# Patient Record
Sex: Female | Born: 1960 | Race: Black or African American | Hispanic: No | Marital: Married | Smoking: Former smoker
Health system: Southern US, Community
[De-identification: ages and names within clinical notes are randomized; demographics above are authoritative.]

## PROBLEM LIST (undated history)

## (undated) DIAGNOSIS — J189 Pneumonia, unspecified organism: Secondary | ICD-10-CM

## (undated) DIAGNOSIS — H269 Unspecified cataract: Secondary | ICD-10-CM

## (undated) DIAGNOSIS — E119 Type 2 diabetes mellitus without complications: Secondary | ICD-10-CM

## (undated) DIAGNOSIS — M1990 Unspecified osteoarthritis, unspecified site: Secondary | ICD-10-CM

## (undated) DIAGNOSIS — I10 Essential (primary) hypertension: Secondary | ICD-10-CM

## (undated) DIAGNOSIS — J45909 Unspecified asthma, uncomplicated: Secondary | ICD-10-CM

## (undated) DIAGNOSIS — D649 Anemia, unspecified: Secondary | ICD-10-CM

## (undated) DIAGNOSIS — C541 Malignant neoplasm of endometrium: Secondary | ICD-10-CM

## (undated) DIAGNOSIS — T7840XA Allergy, unspecified, initial encounter: Secondary | ICD-10-CM

## (undated) DIAGNOSIS — E1140 Type 2 diabetes mellitus with diabetic neuropathy, unspecified: Secondary | ICD-10-CM

## (undated) DIAGNOSIS — Z800 Family history of malignant neoplasm of digestive organs: Secondary | ICD-10-CM

## (undated) DIAGNOSIS — E785 Hyperlipidemia, unspecified: Secondary | ICD-10-CM

## (undated) DIAGNOSIS — K219 Gastro-esophageal reflux disease without esophagitis: Secondary | ICD-10-CM

## (undated) DIAGNOSIS — I442 Atrioventricular block, complete: Secondary | ICD-10-CM

## (undated) DIAGNOSIS — K859 Acute pancreatitis without necrosis or infection, unspecified: Secondary | ICD-10-CM

## (undated) DIAGNOSIS — N939 Abnormal uterine and vaginal bleeding, unspecified: Secondary | ICD-10-CM

## (undated) HISTORY — DX: Unspecified cataract: H26.9

## (undated) HISTORY — PX: TONSILLECTOMY: SUR1361

## (undated) HISTORY — DX: Essential (primary) hypertension: I10

## (undated) HISTORY — DX: Morbid (severe) obesity due to excess calories: E66.01

## (undated) HISTORY — DX: Hyperlipidemia, unspecified: E78.5

## (undated) HISTORY — DX: Type 2 diabetes mellitus without complications: E11.9

## (undated) HISTORY — DX: Family history of malignant neoplasm of digestive organs: Z80.0

## (undated) HISTORY — DX: Unspecified asthma, uncomplicated: J45.909

## (undated) HISTORY — DX: Type 2 diabetes mellitus with diabetic neuropathy, unspecified: E11.40

## (undated) HISTORY — DX: Allergy, unspecified, initial encounter: T78.40XA

## (undated) HISTORY — PX: CERVICAL BIOPSY: SHX590

## (undated) HISTORY — DX: Unspecified osteoarthritis, unspecified site: M19.90

---

## 2008-12-07 HISTORY — PX: CYSTECTOMY: SUR359

## 2016-01-13 ENCOUNTER — Encounter: Payer: Self-pay | Admitting: Internal Medicine

## 2016-02-11 ENCOUNTER — Telehealth: Payer: Self-pay | Admitting: *Deleted

## 2016-02-11 NOTE — Telephone Encounter (Signed)
[facility] on number listed in chart Lelan Pons PV

## 2016-02-11 NOTE — Telephone Encounter (Signed)
Pt returned call and explained due to medical hx and high risk needs OV per MD. She verbalized understanding and we scheduled OV 5-1 at 0845 am with Dr Henrene Pastor. Cancelled PV and colon that was scheduled.  Instructed pt to ck in 0830 am 04-06-16  3rd floor [facility] building.  Lelan Pons PV

## 2016-02-11 NOTE — Telephone Encounter (Signed)
Dr Henrene Pastor, This lady is a direct colon with you on 03-09-16, Monday. She has no GI history.  She has a medical history of HTN, asthma, DM, osteoarthritis. She has a BMI of 59.1 and a weight documented of 384.0 lbs Do you want her to have an OV or can she be a direct hospital? Thanks for your time and please advise,  Marijean Niemann

## 2016-02-11 NOTE — Telephone Encounter (Signed)
This patient needs an office visit first, as she is high risk

## 2016-02-24 ENCOUNTER — Ambulatory Visit (AMBULATORY_SURGERY_CENTER): Payer: Self-pay | Admitting: *Deleted

## 2016-02-24 DIAGNOSIS — Z1211 Encounter for screening for malignant neoplasm of colon: Secondary | ICD-10-CM

## 2016-03-09 ENCOUNTER — Encounter: Payer: Self-pay | Admitting: Internal Medicine

## 2016-04-06 ENCOUNTER — Encounter: Payer: Self-pay | Admitting: Internal Medicine

## 2016-04-06 ENCOUNTER — Ambulatory Visit (INDEPENDENT_AMBULATORY_CARE_PROVIDER_SITE_OTHER): Payer: Managed Care, Other (non HMO) | Admitting: Internal Medicine

## 2016-04-06 VITALS — BP 138/90 | HR 76 | Ht 67.5 in | Wt 395.2 lb

## 2016-04-06 DIAGNOSIS — Z1211 Encounter for screening for malignant neoplasm of colon: Secondary | ICD-10-CM | POA: Diagnosis not present

## 2016-04-06 DIAGNOSIS — K219 Gastro-esophageal reflux disease without esophagitis: Secondary | ICD-10-CM | POA: Diagnosis not present

## 2016-04-06 DIAGNOSIS — R143 Flatulence: Secondary | ICD-10-CM | POA: Diagnosis not present

## 2016-04-06 NOTE — Progress Notes (Signed)
HISTORY OF PRESENT ILLNESS:  Leslie Mullen is a 55 y.o. 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 Hall Mullen regarding screening colonoscopy. Patient has not had prior screening. She is sent for a preprocedure evaluation due to morbid obesity with a BMI of greater than 60. The patient denies family history of colon cancer or problems with bleeding. She denies any problems identified by her PCP such as Hemoccult positive stool or anemia. She does report increased flatus and somewhat alternating bowel habits since recent treatment with antibiotics. She has chronic GERD which is controlled with omeprazole. No dysphagia. No weight loss.  REVIEW OF SYSTEMS:  All non-GI ROS negative except for arthritis, cough, fatigue, ankle swelling  Past Medical History  Diagnosis Date  . Hypertension   . Asthma   . Diabetes ([place])   . Osteoarthritis   . Hyperlipidemia   . Morbid obesity ([place])   . Cataract   . Diabetic neuropathy [facility])     Past Surgical History  Procedure Laterality Date  . Cesarean section      x3    Social History Leslie Mullen  reports that she has quit smoking. She has never used smokeless tobacco. She reports that she does not drink alcohol or use illicit drugs.  family history includes Diabetes in her father; Heart disease in her father.  Allergies  Allergen Reactions  . Penicillins Itching, Shortness Of Breath and Swelling       PHYSICAL EXAMINATION: Vital signs: BP 138/90 mmHg  Pulse 76  Ht 5' 7.5" (1.715 m)  Wt 395 lb 3.2 oz (179.262 kg)  BMI 60.95 kg/m2  Constitutional: Markedly obese but otherwise well-appearing, no acute distress Psychiatric: alert and oriented x3, cooperative Eyes: extraocular movements intact, anicteric, conjunctiva pink Mouth: oral pharynx moist, no lesions Neck: supple but thick without  lymphadenopathy Cardiovascular: heart regular rate and rhythm, no murmur Lungs:  clear to auscultation bilaterally Abdomen: soft, obese, nontender, nondistended, no obvious ascites, no peritoneal signs, normal bowel sounds, no organomegaly Rectal: Omitted Extremities: no clubbing cyanosis or lower extremity edema bilaterally Skin: no lesions on visible extremities Neuro: No focal deficits. Cranial nerves intact.  ASSESSMENT:  #1. Colon cancer screening. We discussed several options but focused on optical colonoscopy and cologard in great detail. I discussed with her that she is high risk for sedation given her body habitus. #2. GERD. It is controlled with omeprazole #3. Recent issues with intestinal gas and alternating bowel habits likely related to recent treatment with antibiotic and alteration of the gut micro-biome  PLAN:  #1. The patient has elected for cologard. She understands if this is positive then optical colonoscopy would follow at some point #2. Reflux precautions #3. Weight loss. Imperative #4. Continue PPI. Lowest dose to control symptoms recommended #5. Probiotic to see if issues with gas and altered bowel habits improve #6. Ongoing general medical care with PCP  A copy of this consultation note has been sent to Dr. Augustin Mullen

## 2016-04-06 NOTE — Patient Instructions (Signed)
You will be contacted by [employer] to initiate your Cologuard

## 2016-06-12 ENCOUNTER — Telehealth: Payer: Self-pay

## 2016-06-12 NOTE — Telephone Encounter (Signed)
Message left by Vivien Rota.  Awaiting response

## 2016-06-12 NOTE — Telephone Encounter (Signed)
Pt has not completed her cologuard test that was ordered 04-06-2016 during the office visit. [employer] has suspended the order due to inactivity. I did leave a voicemail for the pt to remind her to complete the test.

## 2016-06-12 NOTE — Telephone Encounter (Signed)
Yes.  Thank you.

## 2017-03-08 ENCOUNTER — Other Ambulatory Visit: Payer: Self-pay

## 2017-03-08 ENCOUNTER — Telehealth: Payer: Self-pay | Admitting: Internal Medicine

## 2017-03-08 DIAGNOSIS — R195 Other fecal abnormalities: Secondary | ICD-10-CM

## 2017-03-08 LAB — COLOGUARD: Cologuard: POSITIVE

## 2017-03-08 NOTE — Telephone Encounter (Signed)
Called back and let them know we have not received the result. Result to be faxed over.

## 2017-04-16 ENCOUNTER — Ambulatory Visit (AMBULATORY_SURGERY_CENTER): Payer: Self-pay

## 2017-04-16 VITALS — Ht 68.0 in | Wt >= 6400 oz

## 2017-04-16 DIAGNOSIS — R195 Other fecal abnormalities: Secondary | ICD-10-CM

## 2017-04-16 MED ORDER — NA SULFATE-K SULFATE-MG SULF 17.5-3.13-1.6 GM/177ML PO SOLN: 1.0000 | Freq: Once | ORAL | 0 refills | Status: AC

## 2017-04-16 NOTE — Progress Notes (Signed)
Denies allergies to eggs or soy products. Denies complication of anesthesia or sedation. Denies use of weight loss medication. Denies use of O2.   Emmi instructions given for colonoscopy.  

## 2017-04-27 ENCOUNTER — Telehealth: Payer: Self-pay | Admitting: Internal Medicine

## 2017-04-27 NOTE — Telephone Encounter (Signed)
Procedure moved to 06/29/17@[facility]  at 9:15am. Pt should arrive at the hospital at 7:45am. New prep instructions mailed to pt. Pt aware of appt.

## 2017-06-28 ENCOUNTER — Encounter (HOSPITAL_COMMUNITY): Payer: Self-pay | Admitting: *Deleted

## 2017-06-29 ENCOUNTER — Ambulatory Visit (HOSPITAL_COMMUNITY)
Admission: RE | Admit: 2017-06-29 | Discharge: 2017-06-29 | Disposition: A | Discharge status: Home / Self Care | Payer: Managed Care, Other (non HMO) | Source: Ambulatory Visit | Attending: Internal Medicine | Admitting: Internal Medicine

## 2017-06-29 ENCOUNTER — Other Ambulatory Visit: Payer: Self-pay

## 2017-06-29 ENCOUNTER — Encounter (HOSPITAL_COMMUNITY)
Admission: RE | Disposition: A | Discharge status: Home / Self Care | Payer: Self-pay | Source: Ambulatory Visit | Attending: Internal Medicine

## 2017-06-29 ENCOUNTER — Ambulatory Visit (HOSPITAL_COMMUNITY): Payer: Managed Care, Other (non HMO) | Admitting: Anesthesiology

## 2017-06-29 ENCOUNTER — Encounter (HOSPITAL_COMMUNITY): Payer: Self-pay | Admitting: Anesthesiology

## 2017-06-29 DIAGNOSIS — I10 Essential (primary) hypertension: Secondary | ICD-10-CM | POA: Insufficient documentation

## 2017-06-29 DIAGNOSIS — Z794 Long term (current) use of insulin: Secondary | ICD-10-CM | POA: Diagnosis not present

## 2017-06-29 DIAGNOSIS — R195 Other fecal abnormalities: Secondary | ICD-10-CM

## 2017-06-29 DIAGNOSIS — D120 Benign neoplasm of cecum: Secondary | ICD-10-CM | POA: Diagnosis not present

## 2017-06-29 DIAGNOSIS — K5730 Diverticulosis of large intestine without perforation or abscess without bleeding: Secondary | ICD-10-CM | POA: Insufficient documentation

## 2017-06-29 DIAGNOSIS — K219 Gastro-esophageal reflux disease without esophagitis: Secondary | ICD-10-CM | POA: Insufficient documentation

## 2017-06-29 DIAGNOSIS — E1140 Type 2 diabetes mellitus with diabetic neuropathy, unspecified: Secondary | ICD-10-CM | POA: Insufficient documentation

## 2017-06-29 DIAGNOSIS — D122 Benign neoplasm of ascending colon: Secondary | ICD-10-CM | POA: Diagnosis not present

## 2017-06-29 DIAGNOSIS — Z87891 Personal history of nicotine dependence: Secondary | ICD-10-CM | POA: Diagnosis not present

## 2017-06-29 DIAGNOSIS — D123 Benign neoplasm of transverse colon: Secondary | ICD-10-CM

## 2017-06-29 HISTORY — PX: COLONOSCOPY WITH PROPOFOL: SHX5780

## 2017-06-29 HISTORY — DX: Gastro-esophageal reflux disease without esophagitis: K21.9

## 2017-06-29 LAB — GLUCOSE, CAPILLARY: GLUCOSE-CAPILLARY: 153 mg/dL — AB (ref 65–99)

## 2017-06-29 SURGERY — COLONOSCOPY WITH PROPOFOL
Anesthesia: Monitor Anesthesia Care

## 2017-06-29 MED ORDER — SODIUM CHLORIDE 0.9 % IV SOLN
INTRAVENOUS | Status: DC
Start: 2017-06-29 — End: 2017-06-29

## 2017-06-29 MED ORDER — PROPOFOL 10 MG/ML IV BOLUS
INTRAVENOUS | Status: DC | PRN
  Administered 2017-06-29 (×22): 20 mg via INTRAVENOUS
  Administered 2017-06-29: 40 mg via INTRAVENOUS
  Administered 2017-06-29 (×8): 20 mg via INTRAVENOUS

## 2017-06-29 MED ORDER — PROPOFOL 10 MG/ML IV BOLUS
INTRAVENOUS | Status: AC
  Filled 2017-06-29: qty 20

## 2017-06-29 MED ORDER — LACTATED RINGERS IV SOLN
INTRAVENOUS | Status: DC
  Administered 2017-06-29: 09:00:00 via INTRAVENOUS

## 2017-06-29 MED ORDER — PROPOFOL 10 MG/ML IV BOLUS
INTRAVENOUS | Status: AC
  Filled 2017-06-29: qty 40

## 2017-06-29 SURGICAL SUPPLY — 21 items

## 2017-06-29 NOTE — Op Note (Signed)
[facility] Patient Name: Leslie Mullen Procedure Date: 06/29/2017 MRN: [id number] Attending MD: Docia Chuck. Henrene Pastor , MD Date of Birth: 04-04-1961 CSN: [id number] Age: 56 Admit Type: Outpatient Procedure:                Colonoscopy, with submucosal injection and cold                            snare polypectomy x 4 Indications:              Positive Cologuard test Providers:                Docia Chuck. Henrene Pastor, MD, Elmer Ramp. Tilden Dome, RN, Tinnie Gens, Technician Referring MD:             Hall Busing M.D. Medicines:                Monitored Anesthesia Care Complications:            No immediate complications. Estimated blood loss:                            None. Estimated Blood Loss:     Estimated blood loss: none. Procedure:                Pre-Anesthesia Assessment:                           - Prior to the procedure, a History and Physical                            was performed, and patient medications and                            allergies were reviewed. The patient's tolerance of                            previous anesthesia was also reviewed. The risks                            and benefits of the procedure and the sedation                            options and risks were discussed with the patient.                            All questions were answered, and informed consent                            was obtained. Prior Anticoagulants: The patient has                            taken no previous anticoagulant or antiplatelet  agents. ASA Grade Assessment: III - A patient with                            severe systemic disease. After reviewing the risks                            and benefits, the patient was deemed in                            satisfactory condition to undergo the procedure.                           After obtaining informed consent, the colonoscope                            was passed under direct  vision. Throughout the                            procedure, the patient's blood pressure, pulse, and                            oxygen saturations were monitored continuously. The                            EC-[id number] ([id number]) scope was introduced through                            the anus and advanced to the the cecum, identified                            by appendiceal orifice and ileocecal valve. The                            ileocecal valve, appendiceal orifice, and rectum                            were photographed. The quality of the bowel                            preparation was excellent. The colonoscopy was                            performed without difficulty, though somewhat                            cumbersome as the patient was unable to retain air                            well despite nursing assistants help. The patient                            tolerated the procedure well. The bowel preparation  used was SUPREP. Scope In: 9:25:16 AM Scope Out: 9:57:43 AM Scope Withdrawal Time: 0 hours 27 minutes 6 seconds  Total Procedure Duration: 0 hours 32 minutes 27 seconds  Findings:      Four sessile polyps were found in the transverse colon (4 mm), ascending       (10 mm, 15 mm) colon and cecum (5 mm). The ascending colon polyps polyps       were removed with a submucosal saline injection-lift technique followed       by a cold snare polypectomy. The smaller polyps were removed with cold       snare polypectomy alone. Resection and retrieval were complete.      Multiple small and large-mouthed diverticula were found in the left       colon.      The exam was otherwise without abnormality on direct and retroflexion       views. Impression:               - Four polyps in the transverse colon, in the                            ascending colon and in the cecum, removed using                            injection-lift and a cold snare. Resected  and                            retrieved.                           - Diverticulosis in the left colon.                           - The examination was otherwise normal on direct                            and retroflexion views. Moderate Sedation:      none Recommendation:           - Repeat colonoscopy in 3 years for surveillance.                           - Patient has a contact number available for                            emergencies. The signs and symptoms of potential                            delayed complications were discussed with the                            patient. Return to normal activities tomorrow.                            Written discharge instructions were provided to the  patient.                           - Resume previous diet.                           - Continue present medications.                           - Await pathology results. Procedure Code(s):        --- Professional ---                           [phone], Colonoscopy, flexible; with removal of                            tumor(s), polyp(s), or other lesion(s) by snare                            technique                           45381, Colonoscopy, flexible; with directed                            submucosal injection(s), any substance Diagnosis Code(s):        --- Professional ---                           D12.3, Benign neoplasm of transverse colon (hepatic                            flexure or splenic flexure)                           D12.2, Benign neoplasm of ascending colon                           D12.0, Benign neoplasm of cecum                           R19.5, Other fecal abnormalities                           K57.30, Diverticulosis of large intestine without                            perforation or abscess without bleeding CPT copyright 2016 American Medical Association. All rights reserved. The codes documented in this report are preliminary and upon coder  review may  be revised to meet current compliance requirements. Docia Chuck. Henrene Pastor, MD 06/29/2017 10:08:06 AM This report has been signed electronically. Number of Addenda: 0

## 2017-06-29 NOTE — Discharge Instructions (Signed)
YOU HAD AN ENDOSCOPIC PROCEDURE TODAY: Refer to the procedure report and other information in the discharge instructions given to you for any specific questions about what was found during the examination. If this information does not answer your questions, please call [place] office at [phone] to clarify.  ° °YOU SHOULD EXPECT: Some feelings of bloating in the abdomen. Passage of more gas than usual. Walking can help get rid of the air that was put into your GI tract during the procedure and reduce the bloating. If you had a lower endoscopy (such as a colonoscopy or flexible sigmoidoscopy) you may notice spotting of blood in your stool or on the toilet paper. Some abdominal soreness may be present for a day or two, also. ° °DIET: Your first meal following the procedure should be a light meal and then it is ok to progress to your normal diet. A half-sandwich or bowl of soup is an example of a good first meal. Heavy or fried foods are harder to digest and may make you feel nauseous or bloated. Drink plenty of fluids but you should avoid alcoholic beverages for 24 hours. If you had a esophageal dilation, please see attached instructions for diet.   ° °ACTIVITY: Your care partner should take you home directly after the procedure. You should plan to take it easy, moving slowly for the rest of the day. You can resume normal activity the day after the procedure however YOU SHOULD NOT DRIVE, use power tools, machinery or perform tasks that involve climbing or major physical exertion for 24 hours (because of the sedation medicines used during the test).  ° °SYMPTOMS TO REPORT IMMEDIATELY: °A gastroenterologist can be reached at any hour. Please call [phone]  for any of the following symptoms:  °Following lower endoscopy (colonoscopy, flexible sigmoidoscopy) °Excessive amounts of blood in the stool  °Significant tenderness, worsening of abdominal pains  °Swelling of the abdomen that is new, acute  °Fever of 100° or  higher  °Following upper endoscopy (EGD, EUS, ERCP, esophageal dilation) °Vomiting of blood or coffee ground material  °New, significant abdominal pain  °New, significant chest pain or pain under the shoulder blades  °Painful or persistently difficult swallowing  °New shortness of breath  °Black, tarry-looking or red, bloody stools ° °FOLLOW UP:  °If any biopsies were taken you will be contacted by phone or by letter within the next 1-3 weeks. Call [phone]  if you have not heard about the biopsies in 3 weeks.  °Please also call with any specific questions about appointments or follow up tests. ° °

## 2017-06-29 NOTE — Anesthesia Preprocedure Evaluation (Signed)
Anesthesia Evaluation  Patient identified by MRN, date of birth, ID band Patient awake    Reviewed: Allergy & Precautions, NPO status , Patient's Chart, lab work & pertinent test results  Airway Mallampati: II  TM Distance: >3 FB Neck ROM: Full    Dental  (+) Teeth Intact, Dental Advisory Given   Pulmonary former smoker,    breath sounds clear to auscultation       Cardiovascular hypertension, Pt. on medications  Rhythm:Regular Rate:Normal     Neuro/Psych negative neurological ROS  negative psych ROS   GI/Hepatic GERD  Medicated and Controlled,  Endo/Other  diabetes, Type 2, Oral Hypoglycemic Agents, Insulin Dependent  Renal/GU      Musculoskeletal  (+) Arthritis , Osteoarthritis,    Abdominal   Peds  Hematology negative hematology ROS (+)   Anesthesia Other Findings   Reproductive/Obstetrics negative OB ROS                             Anesthesia Physical Anesthesia Plan  ASA: III  Anesthesia Plan: MAC   Post-op Pain Management:    Induction: Intravenous  PONV Risk Score and Plan: Propofol  Airway Management Planned:   Additional Equipment:   Intra-op Plan:   Post-operative Plan:   Informed Consent: I have reviewed the patients History and Physical, chart, labs and discussed the procedure including the risks, benefits and alternatives for the proposed anesthesia with the patient or authorized representative who has indicated his/her understanding and acceptance.   Dental advisory given  Plan Discussed with: CRNA  Anesthesia Plan Comments:         Anesthesia Quick Evaluation

## 2017-06-29 NOTE — Transfer of Care (Signed)
Immediate Anesthesia Transfer of Care Note  Patient: Leslie Mullen  Procedure(s) Performed: Procedure(s): COLONOSCOPY WITH PROPOFOL (N/A)  Patient Location: PACU  Anesthesia Type:MAC  Level of Consciousness: sedated  Airway & Oxygen Therapy: Patient Spontanous Breathing and Patient connected to nasal cannula oxygen  Post-op Assessment: Report given to RN and Post -op Vital signs reviewed and stable  Post vital signs: Reviewed and stable  Last Vitals:  Vitals:   06/29/17 0827  BP: 132/68  Pulse: 85  Resp: 14  Temp: 36.4 C    Last Pain:  Vitals:   06/29/17 0827  TempSrc: Oral         Complications: No apparent anesthesia complications

## 2017-06-29 NOTE — H&P (Signed)
HISTORY OF PRESENT ILLNESS:  Leslie Mullen is a 56 y.o. female who was evaluated last year regarding screening colonoscopy. She chose screening in the form of Cologuard return positive in March. She is now for optical colonoscopy with MAC as she is high-risk given her body habitus. She has had no interval clinical issues of relevance.  REVIEW OF SYSTEMS:  All non-GI ROS negative except for allergies, arthritis obese,  Past Medical History:  Diagnosis Date  . Allergy   . Cataract   . Diabetes ([place])    type II   . Diabetic neuropathy ([place])   . GERD (gastroesophageal reflux disease)   . Hyperlipidemia   . Hypertension   . Morbid obesity ([place])   . Osteoarthritis    right knee     Past Surgical History:  Procedure Laterality Date  . CESAREAN SECTION     x3  . CYSTECTOMY Right 2010   Cyst removed from right groin.     Social History Leslie Mullen  reports that she has quit smoking. She has never used smokeless tobacco. She reports that she does not drink alcohol or use drugs.  family history includes Diabetes in her father; Heart disease in her father.  Allergies  Allergen Reactions  . Penicillins Shortness Of Breath, Itching and Swelling    Has patient had a PCN reaction causing immediate rash, facial/tongue/throat swelling, SOB or lightheadedness with hypotension: Yes Has patient had a PCN reaction causing severe rash involving mucus membranes or skin necrosis:No Has patient had a PCN reaction that required hospitalization: No Has patient had a PCN reaction occurring within the last 10 years: no If all of the above answers are "NO", then may proceed with Cephalosporin use.         PHYSICAL EXAMINATION: Vital signs: BP 132/68   Pulse 85   Temp 97.6 F (36.4 C) (Oral)   Resp 14   Ht _0  (1.753 m)   Wt (!) 397 lb (180.1 kg)   SpO2 97%   BMI 58.63 kg/m   Constitutional: generally well-appearing, no acute distress Psychiatric: alert and oriented x3,  cooperative Eyes: extraocular movements intact, anicteric, conjunctiva pink Mouth: oral pharynx moist, no lesions Neck: supple no lymphadenopathy Cardiovascular: heart regular rate and rhythm, no murmur Lungs: clear to auscultation bilaterally Abdomen: soft, nontender, nondistended, no obvious ascites, no peritoneal signs, normal bowel sounds, no organomegaly Rectal:Deferred until today's colonoscopy Extremities: no clubbing, cyanosis or lower extremity edema bilaterally Skin: no lesions on visible extremities Neuro: No focal deficits. Cranial nerves intact  ASSESSMENT:  #1. Positive cologuard   PLAN:  #1. Optical colonoscopy.The nature of the procedure, as well as the risks, benefits, and alternatives were carefully and thoroughly reviewed with the patient. Ample time for discussion and questions allowed. The patient understood, was satisfied, and agreed to proceed.

## 2017-06-29 NOTE — Anesthesia Postprocedure Evaluation (Signed)
Anesthesia Post Note  Patient: Leslie Mullen  Procedure(s) Performed: Procedure(s) (LRB): COLONOSCOPY WITH PROPOFOL (N/A)     Patient location during evaluation: PACU Anesthesia Type: MAC Level of consciousness: awake and alert Pain management: pain level controlled Vital Signs Assessment: post-procedure vital signs reviewed and stable Respiratory status: spontaneous breathing, nonlabored ventilation, respiratory function stable and patient connected to nasal cannula oxygen Cardiovascular status: stable and blood pressure returned to baseline Anesthetic complications: no    Last Vitals:  Vitals:   06/29/17 1001 06/29/17 1010  BP: (!) 90/53 99/67  Pulse: 77   Resp: 13   Temp: 36.6 C     Last Pain:  Vitals:   06/29/17 1001  TempSrc: Oral                 Effie Berkshire

## 2017-06-30 ENCOUNTER — Encounter: Payer: Self-pay | Admitting: Internal Medicine

## 2017-07-01 ENCOUNTER — Encounter (HOSPITAL_COMMUNITY): Payer: Self-pay | Admitting: Internal Medicine

## 2018-07-12 ENCOUNTER — Other Ambulatory Visit: Payer: Self-pay | Admitting: Obstetrics and Gynecology

## 2018-08-15 ENCOUNTER — Telehealth: Payer: Self-pay | Admitting: *Deleted

## 2018-08-15 NOTE — Telephone Encounter (Signed)
Returned the patient's call to cancel her appt. Offered to reschedule her appt, patient stated "Let me call you back when I'm ready to reschedule."

## 2018-08-16 ENCOUNTER — Ambulatory Visit: Payer: Managed Care, Other (non HMO) | Admitting: Gynecology

## 2018-09-27 ENCOUNTER — Telehealth: Payer: Self-pay

## 2018-09-27 NOTE — Telephone Encounter (Signed)
LM for Cecille Rubin stating that Ms Petitfrere called on 08-15-18 to cancell her appointment for 08-16-18. Offered to r/s her appointment and told scheduler that she would call back to schedule when she was ready to reschedule.

## 2019-01-17 ENCOUNTER — Other Ambulatory Visit: Payer: Self-pay | Admitting: Gynecologic Oncology

## 2019-01-17 ENCOUNTER — Inpatient Hospital Stay: Payer: Managed Care, Other (non HMO) | Attending: Gynecology | Admitting: Gynecology

## 2019-01-17 ENCOUNTER — Encounter: Payer: Self-pay | Admitting: Gynecology

## 2019-01-17 VITALS — BP 130/82 | HR 89 | Temp 98.3°F | Resp 18 | Ht 68.0 in | Wt 392.0 lb

## 2019-01-17 DIAGNOSIS — I10 Essential (primary) hypertension: Secondary | ICD-10-CM | POA: Diagnosis not present

## 2019-01-17 DIAGNOSIS — M1990 Unspecified osteoarthritis, unspecified site: Secondary | ICD-10-CM | POA: Insufficient documentation

## 2019-01-17 DIAGNOSIS — G8918 Other acute postprocedural pain: Secondary | ICD-10-CM

## 2019-01-17 DIAGNOSIS — K219 Gastro-esophageal reflux disease without esophagitis: Secondary | ICD-10-CM | POA: Insufficient documentation

## 2019-01-17 DIAGNOSIS — N8501 Benign endometrial hyperplasia: Secondary | ICD-10-CM

## 2019-01-17 DIAGNOSIS — E119 Type 2 diabetes mellitus without complications: Secondary | ICD-10-CM | POA: Insufficient documentation

## 2019-01-17 DIAGNOSIS — E785 Hyperlipidemia, unspecified: Secondary | ICD-10-CM | POA: Insufficient documentation

## 2019-01-17 DIAGNOSIS — Z87891 Personal history of nicotine dependence: Secondary | ICD-10-CM | POA: Diagnosis not present

## 2019-01-17 MED ORDER — SENNOSIDES-DOCUSATE SODIUM 8.6-50 MG PO TABS: 2.0000 | ORAL_TABLET | Freq: Every day | ORAL | 1 refills | Status: DC

## 2019-01-17 MED ORDER — OXYCODONE HCL 5 MG PO TABS: 5.0000 mg | ORAL_TABLET | ORAL | 0 refills | Status: DC | PRN

## 2019-01-17 NOTE — Patient Instructions (Signed)
Preparing for your Surgery  Plan for surgery on January 24, 2019 with Dr. Everitt Amber at [facility] will be scheduled for a robotic assisted total hysterectomy, bilateral salpingo-oophorectomy, sentinel lymph node biopsy, possible mini-laparotomy.   Pre-operative Testing -You will receive a phone call from presurgical testing at [facility] if you have not received a call already to arrange for a pre-operative testing appointment before your surgery.  This appointment normally occurs one to two weeks before your scheduled surgery.   -Bring your insurance card, copy of an advanced directive if applicable, medication list  -At that visit, you will be asked to sign a consent for a possible blood transfusion in case a transfusion becomes necessary during surgery.  The need for a blood transfusion is rare but having consent is a necessary part of your care.     -You should not be taking blood thinners or aspirin at least ten days prior to surgery unless instructed by your surgeon.  Day Before Surgery at [facility] will be asked to take in a light diet the day before surgery.  Avoid carbonated beverages.  You will be advised to have nothing to eat or drink after midnight the evening before.    Eat a light diet the day before surgery.  Examples including soups, broths, toast, yogurt, mashed potatoes.  Things to avoid include carbonated beverages (fizzy beverages), raw fruits and raw vegetables, or beans.   If your bowels are filled with gas, your surgeon will have difficulty visualizing your pelvic organs which increases your surgical risks.  Your role in recovery Your role is to become active as soon as directed by your doctor, while still giving yourself time to heal.  Rest when you feel tired. You will be asked to do the following in order to speed your recovery:  - Cough and breathe deeply. This helps toclear and expand your lungs and can prevent pneumonia. You may be given  a spirometer to practice deep breathing. A staff member will show you how to use the spirometer. - Do mild physical activity. Walking or moving your legs help your circulation and body functions return to normal. A staff member will help you when you try to walk and will provide you with simple exercises. Do not try to get up or walk alone the first time. - Actively manage your pain. Managing your pain lets you move in comfort. We will ask you to rate your pain on a scale of zero to 10. It is your responsibility to tell your doctor or nurse where and how much you hurt so your pain can be treated.  Special Considerations -If you are diabetic, you may be placed on insulin after surgery to have closer control over your blood sugars to promote healing and recovery.  This does not mean that you will be discharged on insulin.  If applicable, your oral antidiabetics will be resumed when you are tolerating a solid diet.  -Your final pathology results from surgery should be available around one week after surgery and the results will be relayed to you when available.  -Dr. Lahoma Crocker is the Surgeon that assists your GYN Oncologist with surgery.  The next day after your surgery you will either see Dr. Denman George or Dr. Lahoma Crocker.  -FMLA forms can be faxed to [phone] and please allow 5-7 business days for completion.  Pain Management After Surgery -You have been prescribed your pain medication and bowel regimen medications before surgery so  that you can have these available when you are discharged from the hospital. The pain medication is for use ONLY AFTER surgery and a new prescription will not be given.   -Make sure that you have Tylenol and Ibuprofen at home to use on a regular basis after surgery for pain control. We recommend alternating the medications every hour to six hours since they work differently and are processed in the body differently for pain relief.  -Review the attached  handout on narcotic use and their risks and side effects.   Bowel Regimen -You have been prescribed Sennakot-S to take nightly to prevent constipation especially if you are taking the narcotic pain medication intermittently.  It is important to prevent constipation and drink adequate amounts of liquids.   Blood Transfusion Information WHAT IS A BLOOD TRANSFUSION? A transfusion is the replacement of blood or some of its parts. Blood is made up of multiple cells which provide different functions.  Red blood cells carry oxygen and are used for blood loss replacement.  White blood cells fight against infection.  Platelets control bleeding.  Plasma helps clot blood.  Other blood products are available for specialized needs, such as hemophilia or other clotting disorders. BEFORE THE TRANSFUSION  Who gives blood for transfusions?   You may be able to donate blood to be used at a later date on yourself (autologous donation).  Relatives can be asked to donate blood. This is generally not any safer than if you have received blood from a stranger. The same precautions are taken to ensure safety when a relative's blood is donated.  Healthy volunteers who are fully evaluated to make sure their blood is safe. This is blood bank blood. Transfusion therapy is the safest it has ever been in the practice of medicine. Before blood is taken from a donor, a complete history is taken to make sure that person has no history of diseases nor engages in risky social behavior (examples are intravenous drug use or sexual activity with multiple partners). The donor's travel history is screened to minimize risk of transmitting infections, such as malaria. The donated blood is tested for signs of infectious diseases, such as HIV and hepatitis. The blood is then tested to be sure it is compatible with you in order to minimize the chance of a transfusion reaction. If you or a relative donates blood, this is often done in  anticipation of surgery and is not appropriate for emergency situations. It takes many days to process the donated blood. RISKS AND COMPLICATIONS Although transfusion therapy is very safe and saves many lives, the main dangers of transfusion include:   Getting an infectious disease.  Developing a transfusion reaction. This is an allergic reaction to something in the blood you were given. Every precaution is taken to prevent this. The decision to have a blood transfusion has been considered carefully by your caregiver before blood is given. Blood is not given unless the benefits outweigh the risks.

## 2019-01-17 NOTE — Progress Notes (Signed)
Consult Note: Gyn-Onc   Leslie Mullen 58 y.o. female  No chief complaint on file.   Assessment : Complex atypical hyperplasia of the endometrium.  Medical comorbidities including extreme obesity, hypertension, diabetes, and prior pelvic surgery.  Plan: I had a lengthy discussion with the patient and her daughter regarding the natural history of atypical complex hyperplasia and the potential for progression to endometrial carcinoma or the co-occurrence of endometrial cancer.  They understand that the cornerstone of therapy is a hysterectomy with bilateral salpingo-oophorectomy and intraoperative frozen section to determine whether there is a con- current malignancy and if so the extent of the lesion.  Sentinel lymph nodes may be advised.  The patient and her daughter are aware of the fact that she is at increased risk for surgical complications given her comorbidities and morbid obesity.  They also understand that she may not tolerate steep Trendelenburg position and therefore surgery would need to be performed through a midline incision or use of a Mirena IUD.  At this juncture the patient is not able to give me an answer as to whether she would prefer an abdominal hysterectomy or placement of a Mirena IUD.  Surgery will be scheduled to be performed robotically by Dr. Everitt Amber.  The risks of surgery including hemorrhage, infection, injury to adjacent viscera, anesthetic risks, were all discussed in detail.  The patient is aware that cancer may be present and that postoperative radiation or chemotherapy are possible based on final pathology report.  All their questions were answered.     HPI: 58 year old seen in consultation request of Dr. Charlesetta Garibaldi regarding management of complex atypical endometrial hyperplasia.  Patient presented with postmenopausal bleeding and underwent an endometrial biopsy on July 12, 2018.  At that time the patient was referred to gynecologic oncology.  However the patient  called and canceled her appointment that had been scheduled for August 16, 2018.  Patient now presents for initial consultation.   Ultrasound performed July 11, 2018 showed the uterus to measure 4.8 x 3.7 x 5.2 cm with an endometrial stripe of 5 mm.  Patient reports that she has intermittent spotting no heavy bleeding and no cramping.  She has no other constitutional symptoms.  Patient does have a number of medical comorbidities including extreme obesity (BMI of 59), diabetes, hypertension.  She is also had 3 prior C-sections.  Review of Systems:10 point review of systems is negative except as noted in interval history.   Vitals: Blood pressure (!) 165/90, pulse 89, temperature 98.3 F (36.8 C), temperature source Oral, resp. rate 18, height 5\' 8"  (1.727 m), weight (!) 392 lb (177.8 kg), SpO2 98 %.  Physical Exam: General : The patient is a healthy woman who is morbidly obese but in no acute distress.  HEENT: normocephalic, extraoccular movements normal; neck is supple without thyromegally  Lynphnodes: Supraclavicular and inguinal nodes not enlarged  Abdomen: Obese with a well-healed low midline incision (cesarean section x3) soft, non-tender, no ascites, no organomegally, no masses, no hernias  Pelvic:  EGBUS: Normal female  Vagina: Normal, no lesions  Urethra and Bladder: Normal, non-tender  Cervix: Appears normal Uterus: Unable to evaluate secondary to her morbid obesity.  (Ultrasound shows that the uterus is 4.8 x 3.7 x 5.2 cm) Bi-manual examination: Non-tender; no adenxal masses or nodularity  Rectal: normal sphincter tone, no masses, no blood  Lower extremities: No edema or varicosities. Normal range of motion      Allergies  Allergen Reactions  . Penicillins Shortness Of Breath,  Itching and Swelling    Has patient had a PCN reaction causing immediate rash, facial/tongue/throat swelling, SOB or lightheadedness with hypotension: Yes Has patient had a PCN reaction causing  severe rash involving mucus membranes or skin necrosis:No Has patient had a PCN reaction that required hospitalization: No Has patient had a PCN reaction occurring within the last 10 years: no If all of the above answers are "NO", then may proceed with Cephalosporin use.      Past Medical History:  Diagnosis Date  . Allergy   . Cataract   . Diabetes ([place])    type II   . Diabetic neuropathy ([place])   . GERD (gastroesophageal reflux disease)   . Hyperlipidemia   . Hypertension   . Morbid obesity ([place])   . Osteoarthritis    right knee     Past Surgical History:  Procedure Laterality Date  . CESAREAN SECTION     x3  . COLONOSCOPY WITH PROPOFOL N/A 06/29/2017   Procedure: COLONOSCOPY WITH PROPOFOL;  Surgeon: Irene Shipper, MD;  Location: [facility];  Service: Endoscopy;  Laterality: N/A;  . CYSTECTOMY Right 2010   Cyst removed from right groin.     Current Outpatient Medications  Medication Sig Dispense Refill  . amLODipine (NORVASC) 10 MG tablet Take 10 mg by mouth daily.    Marland Kitchen aspirin 81 MG tablet Take by mouth.    . Cinnamon 500 MG capsule Take by mouth.    Marland Kitchen glimepiride (AMARYL) 4 MG tablet Take 4 mg by mouth daily with breakfast.    . glucose blood (ONE TOUCH ULTRA TEST) test strip     . ibuprofen (ADVIL,MOTRIN) 800 MG tablet Take 800 mg by mouth every 8 (eight) hours as needed.    . Insulin Degludec (TRESIBA FLEXTOUCH) 200 UNIT/ML SOPN 80 units QHS    . Insulin Pen Needle (B-D UF III MINI PEN NEEDLES) 31G X 5 MM MISC     . JARDIANCE 10 MG TABS tablet Take 10 mg by mouth daily.  5  . lisinopril-hydrochlorothiazide (PRINZIDE,ZESTORETIC) 20-25 MG tablet Take 1 tablet by mouth daily.    . metFORMIN (GLUCOPHAGE) 1000 MG tablet Take 1,000 mg by mouth 2 (two) times daily with a meal.    . Misc. Devices MISC     . Multiple Vitamin (THERA) TABS Take by mouth.    Glory Rosebush DELICA LANCETS 28Z MISC     . pravastatin (PRAVACHOL) 40 MG tablet Take 40 mg by mouth daily.    .  traMADol (ULTRAM) 50 MG tablet TAKE 1 TABLET(50 MG) BY MOUTH EVERY NIGHT 1 HOUR BEFORE BEDTIME    . omeprazole (PRILOSEC) 20 MG capsule Take 1 mg by mouth.      No current facility-administered medications for this visit.     Social History   Socioeconomic History  . Marital status: Married    Spouse name: Not on file  . Number of children: 2  . Years of education: Not on file  . Highest education level: Not on file  Occupational History  . Occupation: Intake Coordinator  Social Needs  . Financial resource strain: Not on file  . Food insecurity:    Worry: Not on file    Inability: Not on file  . Transportation needs:    Medical: Not on file    Non-medical: Not on file  Tobacco Use  . Smoking status: Former Research scientist (life sciences)  . Smokeless tobacco: Never Used  . Tobacco comment: 18 years ago.  Substance and Sexual  Activity  . Alcohol use: No    Alcohol/week: 0.0 standard drinks  . Drug use: No  . Sexual activity: Not on file  Lifestyle  . Physical activity:    Days per week: Not on file    Minutes per session: Not on file  . Stress: Not on file  Relationships  . Social connections:    Talks on phone: Not on file    Gets together: Not on file    Attends religious service: Not on file    Active member of club or organization: Not on file    Attends meetings of clubs or organizations: Not on file    Relationship status: Not on file  . Intimate partner violence:    Fear of current or ex partner: Not on file    Emotionally abused: Not on file    Physically abused: Not on file    Forced sexual activity: Not on file  Other Topics Concern  . Not on file  Social History Narrative  . Not on file    Family History  Problem Relation Age of Onset  . Diabetes Father   . Heart disease Father   . Colon cancer Neg Hx   . Esophageal cancer Neg Hx   . Rectal cancer Neg Hx   . Stomach cancer Neg Hx       Marti Sleigh, MD 01/17/2019, 12:37 PM

## 2019-01-18 NOTE — Patient Instructions (Addendum)
Leslie Mullen  01/18/2019   Your procedure is scheduled on: 01-24-2019  Report to [facility] Main  Entrance  Report to admitting at 1100 AM    Call this number if you have problems the morning of surgery [phone]   Remember: Do not eat food  :After Midnight. CLEAR LIQUIDS FROM MIDNIGHT UNTIL 700 AM DAY OF SURGERY, NOTHING BY MOUTH AFTER 700 AM . BRUSH YOUR TEETH MORNING OF SURGERY AND RINSE YOUR MOUTH OUT, NO CHEWING GUM CANDY OR MINTS.     CLEAR LIQUID DIET   Foods Allowed                                                                     Foods Excluded  Coffee and tea, regular and decaf                             liquids that you cannot  Plain Jell-O in any flavor                                             see through such as: Fruit ices (not with fruit pulp)                                     milk, soups, orange juice  Iced Popsicles                                    All solid food                             Cranberry, grape and apple juices Sports drinks like Gatorade Lightly seasoned clear broth or consume(fat free) Sugar, honey syrup  Sample Menu Breakfast                                Lunch                                     Supper Cranberry juice                    Beef broth                            Chicken broth Jell-O                                     Grape juice                           Apple  juice Coffee or tea                        Jell-O                                      Popsicle                                                Coffee or tea                        Coffee or tea  _____________________________________________________________________ Eat a light diet the day before surgery ON 01-23-2019.  Examples including soups, broths, toast, yogurt, mashed potatoes.  Things to avoid include carbonated beverages (fizzy beverages), raw fruits and raw vegetables, or beans.   If your bowels are filled with gas, your surgeon  will have difficulty visualizing your pelvic organs which increases your surgical risks.    Take these medicines the morning of surgery with A SIP OF WATER: NONE DO NOT TAKE ANY DIABETIC MEDICATIONS DAY OF YOUR SURGERY               How to Manage Your Diabetes Before and After Surgery  Why is it important to control my blood sugar before and after surgery? . Improving blood sugar levels before and after surgery helps healing and can limit problems. . A way of improving blood sugar control is eating a healthy diet by: o  Eating less sugar and carbohydrates o  Increasing activity/exercise o  Talking with your doctor about reaching your blood sugar goals . High blood sugars (greater than 180 mg/dL) can raise your risk of infections and slow your recovery, so you will need to focus on controlling your diabetes during the weeks before surgery. . Make sure that the doctor who takes care of your diabetes knows about your planned surgery including the date and location.  How do I manage my blood sugar before surgery? . Check your blood sugar at least 4 times a day, starting 2 days before surgery, to make sure that the level is not too high or low. o Check your blood sugar the morning of your surgery when you wake up and every 2 hours until you get to the Short Stay unit. . If your blood sugar is less than 70 mg/dL, you will need to treat for low blood sugar: o Do not take insulin. o Treat a low blood sugar (less than 70 mg/dL) with  cup of clear juice (cranberry or apple), 4 glucose tablets, OR glucose gel. o Recheck blood sugar in 15 minutes after treatment (to make sure it is greater than 70 mg/dL). If your blood sugar is not greater than 70 mg/dL on recheck, call [phone] for further instructions. . Report your blood sugar to the short stay nurse when you get to Short Stay.  . If you are admitted to the hospital after surgery: o Your blood sugar will be checked by the staff and you will  probably be given insulin after surgery (instead of oral diabetes medicines) to make sure you have good blood sugar levels. o The goal for blood sugar control after surgery is 80-180 mg/dL.   WHAT DO I DO  ABOUT MY DIABETES MEDICATION?  Marland Kitchen Do not take oral diabetes medicines (pills) the morning of surgery.  . THE DAY BEFORE SURGERY TAKE YOUR METFORMIN AS USUAL, DO NOT TAKE YOUR METFORMIN DAY OF SURGERY. . THE DAY BEFORE SURGERY TAKE ONLY YOUR MORNING DOSE OF GLIMPERIDE, DAY OF SURGERY DO NOT TAKE [place] . THE DAY BEFORE SURGERY DO NOT TAKE  YOUR EVENING DOSE OF TRESHEBA. . DO NOT TAKE YOUR JARDIANCE THE DAY BEFORE SURGERY OF DAY OF SURGERY. DO NOT TAKE JARDIANCE DAY OF SURGERY.                           You may not have any metal on your body including hair pins and              piercings  Do not wear jewelry, make-up, lotions, powders or perfumes, deodorant             Do not wear nail polish.  Do not shave  48 hours prior to surgery.                 Do not bring valuables to the hospital. [place].  Contacts, dentures or bridgework may not be worn into surgery.  Leave suitcase in the car. After surgery it may be brought to your room.     r driver:  Special Instructions: N/A              Please read over the following fact sheets you were given: _____________________________________________________________________  [facility] - Preparing for Surgery Before surgery, you can play an important role.  Because skin is not sterile, your skin needs to be as free of germs as possible.  You can reduce the number of germs on your skin by washing with CHG (chlorahexidine gluconate) soap before surgery.  CHG is an antiseptic cleaner which kills germs and bonds with the skin to continue killing germs even after washing. Please DO NOT use if you have an allergy to CHG or antibacterial soaps.  If your skin becomes reddened/irritated stop  using the CHG and inform your nurse when you arrive at Short Stay. Do not shave (including legs and underarms) for at least 48 hours prior to the first CHG shower.  You may shave your face/neck. Please follow these instructions carefully:  1.  Shower with CHG Soap the night before surgery and the  morning of Surgery.  2.  If you choose to wash your hair, wash your hair first as usual with your  normal  shampoo.  3.  After you shampoo, rinse your hair and body thoroughly to remove the  shampoo.                           4.  Use CHG as you would any other liquid soap.  You can apply chg directly  to the skin and wash                       Gently with a scrungie or clean washcloth.  5.  Apply the CHG Soap to your body ONLY FROM THE NECK DOWN.   Do not use on face/ open  Wound or open sores. Avoid contact with eyes, ears mouth and genitals (private parts).                       Wash face,  Genitals (private parts) with your normal soap.             6.  Wash thoroughly, paying special attention to the area where your surgery  will be performed.  7.  Thoroughly rinse your body with warm water from the neck down.  8.  DO NOT shower/wash with your normal soap after using and rinsing off  the CHG Soap.                9.  Pat yourself dry with a clean towel.            10.  Wear clean pajamas.            11.  Place clean sheets on your bed the night of your first shower and do not  sleep with pets. Day of Surgery : Do not apply any lotions/deodorants the morning of surgery.  Please wear clean clothes to the hospital/surgery center.  FAILURE TO FOLLOW THESE INSTRUCTIONS MAY RESULT IN THE CANCELLATION OF YOUR SURGERY PATIENT SIGNATURE_________________________________  NURSE SIGNATURE__________________________________  ________________________________________________________________________   Adam Phenix  An incentive spirometer is a tool that can help keep your  lungs clear and active. This tool measures how well you are filling your lungs with each breath. Taking long deep breaths may help reverse or decrease the chance of developing breathing (pulmonary) problems (especially infection) following:  A long period of time when you are unable to move or be active. BEFORE THE PROCEDURE   If the spirometer includes an indicator to show your best effort, your nurse or respiratory therapist will set it to a desired goal.  If possible, sit up straight or lean slightly forward. Try not to slouch.  Hold the incentive spirometer in an upright position. INSTRUCTIONS FOR USE  1. Sit on the edge of your bed if possible, or sit up as far as you can in bed or on a chair. 2. Hold the incentive spirometer in an upright position. 3. Breathe out normally. 4. Place the mouthpiece in your mouth and seal your lips tightly around it. 5. Breathe in slowly and as deeply as possible, raising the piston or the ball toward the top of the column. 6. Hold your breath for 3-5 seconds or for as long as possible. Allow the piston or ball to fall to the bottom of the column. 7. Remove the mouthpiece from your mouth and breathe out normally. 8. Rest for a few seconds and repeat Steps 1 through 7 at least 10 times every 1-2 hours when you are awake. Take your time and take a few normal breaths between deep breaths. 9. The spirometer may include an indicator to show your best effort. Use the indicator as a goal to work toward during each repetition. 10. After each set of 10 deep breaths, practice coughing to be sure your lungs are clear. If you have an incision (the cut made at the time of surgery), support your incision when coughing by placing a pillow or rolled up towels firmly against it. Once you are able to get out of bed, walk around indoors and cough well. You may stop using the incentive spirometer when instructed by your caregiver.  RISKS AND COMPLICATIONS  Take your time so  you do not  get dizzy or light-headed.  If you are in pain, you may need to take or ask for pain medication before doing incentive spirometry. It is harder to take a deep breath if you are having pain. AFTER USE  Rest and breathe slowly and easily.  It can be helpful to keep track of a log of your progress. Your caregiver can provide you with a simple table to help with this. If you are using the spirometer at home, follow these instructions: [place] IF:   You are having difficultly using the spirometer.  You have trouble using the spirometer as often as instructed.  Your pain medication is not giving enough relief while using the spirometer.  You develop fever of 100.5 F (38.1 C) or higher. SEEK IMMEDIATE MEDICAL CARE IF:   You cough up bloody sputum that had not been present before.  You develop fever of 102 F (38.9 C) or greater.  You develop worsening pain at or near the incision site. MAKE SURE YOU:   Understand these instructions.  Will watch your condition.  Will get help right away if you are not doing well or get worse. Document Released: 04/05/2007 Document Revised: 02/15/2012 Document Reviewed: 06/06/2007 Exit[employer] Patient Information 2014 Exit[employer], [place].   ________________________________________________________________________  WHAT IS A BLOOD TRANSFUSION? Blood Transfusion Information  A transfusion is the replacement of blood or some of its parts. Blood is made up of multiple cells which provide different functions.  Red blood cells carry oxygen and are used for blood loss replacement.  White blood cells fight against infection.  Platelets control bleeding.  Plasma helps clot blood.  Other blood products are available for specialized needs, such as hemophilia or other clotting disorders. BEFORE THE TRANSFUSION  Who gives blood for transfusions?   Healthy volunteers who are fully evaluated to make sure their blood is safe. This is blood  bank blood. Transfusion therapy is the safest it has ever been in the practice of medicine. Before blood is taken from a donor, a complete history is taken to make sure that person has no history of diseases nor engages in risky social behavior (examples are intravenous drug use or sexual activity with multiple partners). The donor's travel history is screened to minimize risk of transmitting infections, such as malaria. The donated blood is tested for signs of infectious diseases, such as HIV and hepatitis. The blood is then tested to be sure it is compatible with you in order to minimize the chance of a transfusion reaction. If you or a relative donates blood, this is often done in anticipation of surgery and is not appropriate for emergency situations. It takes many days to process the donated blood. RISKS AND COMPLICATIONS Although transfusion therapy is very safe and saves many lives, the main dangers of transfusion include:   Getting an infectious disease.  Developing a transfusion reaction. This is an allergic reaction to something in the blood you were given. Every precaution is taken to prevent this. The decision to have a blood transfusion has been considered carefully by your caregiver before blood is given. Blood is not given unless the benefits outweigh the risks. AFTER THE TRANSFUSION  Right after receiving a blood transfusion, you will usually feel much better and more energetic. This is especially true if your red blood cells have gotten low (anemic). The transfusion raises the level of the red blood cells which carry oxygen, and this usually causes an energy increase.  The nurse administering the transfusion will  monitor you carefully for complications. HOME CARE INSTRUCTIONS  No special instructions are needed after a transfusion. You may find your energy is better. Speak with your caregiver about any limitations on activity for underlying diseases you may have. SEEK MEDICAL CARE  IF:   Your condition is not improving after your transfusion.  You develop redness or irritation at the intravenous (IV) site. SEEK IMMEDIATE MEDICAL CARE IF:  Any of the following symptoms occur over the next 12 hours:  Shaking chills.  You have a temperature by mouth above 102 F (38.9 C), not controlled by medicine.  Chest, back, or muscle pain.  People around you feel you are not acting correctly or are confused.  Shortness of breath or difficulty breathing.  Dizziness and fainting.  You get a rash or develop hives.  You have a decrease in urine output.  Your urine turns a dark color or changes to pink, red, or brown. Any of the following symptoms occur over the next 10 days:  You have a temperature by mouth above 102 F (38.9 C), not controlled by medicine.  Shortness of breath.  Weakness after normal activity.  The white part of the eye turns yellow (jaundice).  You have a decrease in the amount of urine or are urinating less often.  Your urine turns a dark color or changes to pink, red, or brown. Document Released: 11/20/2000 Document Revised: 02/15/2012 Document Reviewed: 07/09/2008 [facility] Patient Information 2014 [place], [place].  _______________________________________________________________________

## 2019-01-23 ENCOUNTER — Other Ambulatory Visit: Payer: Self-pay

## 2019-01-23 ENCOUNTER — Encounter (HOSPITAL_COMMUNITY): Payer: Self-pay

## 2019-01-23 ENCOUNTER — Telehealth: Payer: Self-pay

## 2019-01-23 ENCOUNTER — Encounter (HOSPITAL_COMMUNITY): Payer: Self-pay | Admitting: Certified Registered"

## 2019-01-23 ENCOUNTER — Encounter (HOSPITAL_COMMUNITY): Payer: Self-pay | Admitting: Physician Assistant

## 2019-01-23 ENCOUNTER — Encounter (HOSPITAL_COMMUNITY)
Admission: RE | Admit: 2019-01-23 | Discharge: 2019-01-23 | Disposition: A | Discharge status: Home / Self Care | Payer: Managed Care, Other (non HMO) | Source: Ambulatory Visit | Attending: Gynecologic Oncology | Admitting: Gynecologic Oncology

## 2019-01-23 DIAGNOSIS — R9431 Abnormal electrocardiogram [ECG] [EKG]: Secondary | ICD-10-CM | POA: Insufficient documentation

## 2019-01-23 DIAGNOSIS — Z01818 Encounter for other preprocedural examination: Secondary | ICD-10-CM | POA: Insufficient documentation

## 2019-01-23 DIAGNOSIS — N8501 Benign endometrial hyperplasia: Secondary | ICD-10-CM | POA: Insufficient documentation

## 2019-01-23 DIAGNOSIS — I10 Essential (primary) hypertension: Secondary | ICD-10-CM | POA: Diagnosis not present

## 2019-01-23 HISTORY — DX: Abnormal uterine and vaginal bleeding, unspecified: N93.9

## 2019-01-23 LAB — COMPREHENSIVE METABOLIC PANEL
ALT: 17 U/L (ref 0–44)
ANION GAP: 7 (ref 5–15)
AST: 16 U/L (ref 15–41)
Albumin: 3 g/dL — ABNORMAL LOW (ref 3.5–5.0)
Alkaline Phosphatase: 61 U/L (ref 38–126)
BUN: 29 mg/dL — ABNORMAL HIGH (ref 6–20)
CO2: 28 mmol/L (ref 22–32)
Calcium: 8.7 mg/dL — ABNORMAL LOW (ref 8.9–10.3)
Chloride: 105 mmol/L (ref 98–111)
Creatinine, Ser: 1.26 mg/dL — ABNORMAL HIGH (ref 0.44–1.00)
GFR, EST AFRICAN AMERICAN: 55 mL/min — AB (ref >60)
GFR, EST NON AFRICAN AMERICAN: 48 mL/min — AB (ref >60)
Glucose, Bld: 135 mg/dL — ABNORMAL HIGH (ref 70–99)
Potassium: 4 mmol/L (ref 3.5–5.1)
Sodium: 140 mmol/L (ref 135–145)
Total Bilirubin: 0.6 mg/dL (ref 0.3–1.2)
Total Protein: 7.2 g/dL (ref 6.5–8.1)

## 2019-01-23 LAB — CBC
HCT: 40.4 % (ref 36.0–46.0)
Hemoglobin: 12.1 g/dL (ref 12.0–15.0)
MCH: 27.2 pg (ref 26.0–34.0)
MCHC: 30 g/dL (ref 30.0–36.0)
MCV: 90.8 fL (ref 80.0–100.0)
Platelets: 270 10*3/uL (ref 150–400)
RBC: 4.45 MIL/uL (ref 3.87–5.11)
RDW: 13.8 % (ref 11.5–15.5)
WBC: 11.2 10*3/uL — ABNORMAL HIGH (ref 4.0–10.5)
nRBC: 0 % (ref 0.0–0.2)

## 2019-01-23 LAB — URINALYSIS, ROUTINE W REFLEX MICROSCOPIC
Bilirubin Urine: NEGATIVE
Glucose, UA: >=500 mg/dL — AB
Ketones, ur: NEGATIVE mg/dL
Leukocytes,Ua: NEGATIVE
Nitrite: NEGATIVE
Protein, ur: >=300 mg/dL — AB
Specific Gravity, Urine: 1.022 (ref 1.005–1.030)
pH: 5 (ref 5.0–8.0)

## 2019-01-23 LAB — ABO/RH: ABO/RH(D): B POS

## 2019-01-23 LAB — GLUCOSE, CAPILLARY: Glucose-Capillary: 130 mg/dL — ABNORMAL HIGH (ref 70–99)

## 2019-01-23 NOTE — Telephone Encounter (Signed)
Left message for Leslie Mullen to call the office regarding surgery for 01-24-19. Several attempts have been made to reach pt to let her know that her surgery maybe postponed if 01-23-19  Hgb A1c is in the 10's per [facility]. Spoke with [facility] to try to get lab work done stat ~3 pm. This was not able to be done due to no ascension number assigned to specimen at this time.  Will need to let Leslie Mullen know that the [place] from her GYN  Looks like the uterus is small enough to fit through the vagina when she has her surgery per Leslie John, NP>

## 2019-01-23 NOTE — Progress Notes (Signed)
cmet and ua results routed to [facility] cross np epic inbasket

## 2019-01-24 ENCOUNTER — Telehealth: Payer: Self-pay | Admitting: Gynecologic Oncology

## 2019-01-24 ENCOUNTER — Ambulatory Visit (HOSPITAL_COMMUNITY)
Admission: RE | Admit: 2019-01-24 | Payer: Managed Care, Other (non HMO) | Source: Ambulatory Visit | Admitting: Gynecologic Oncology

## 2019-01-24 ENCOUNTER — Encounter (HOSPITAL_COMMUNITY): Admission: RE | Payer: Self-pay | Source: Ambulatory Visit

## 2019-01-24 ENCOUNTER — Telehealth: Payer: Self-pay

## 2019-01-24 LAB — TYPE AND SCREEN
ABO/RH(D): B POS
Antibody Screen: NEGATIVE

## 2019-01-24 LAB — HEMOGLOBIN A1C
Hgb A1c MFr Bld: 9.5 % — ABNORMAL HIGH (ref 4.8–5.6)
Mean Plasma Glucose: 226 mg/dL

## 2019-01-24 SURGERY — HYSTERECTOMY, TOTAL, ROBOT-ASSISTED, LAPAROSCOPIC, WITH BILATERAL SALPINGO-OOPHORECTOMY
Anesthesia: General

## 2019-01-24 NOTE — Telephone Encounter (Signed)
Patient returned call to the office.  Discussed results of pre-op Hgb AIC and other pre-op labs including UA and Dr. Serita Grit concerns with moving forward with surgery at this time including the high risk for infection (see telephone from this am).  Patient would like to post-pone her surgery at this time.  Advised she would be contacted with recommendations moving forward whether it be with oral progesterone or an IUD.  No concerns voiced. Advised to call in between that time if any concerns or needs arise.

## 2019-01-24 NOTE — Telephone Encounter (Signed)
Left message that her lab results "showed some abnormalities which may impact the safety of proceeding with surgery today". Recommended to contact our office as soon as it is open at 9. 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   If she were to proceed today it would need to be with her acceptance of the high risk which is an avoidable risk.   Thereasa Solo, MD

## 2019-01-24 NOTE — Telephone Encounter (Signed)
LM for Leslie Mullen to call back to Dr. Serita Grit office to verify pharmacy to have the Megace 40 mg bid prescription sent in. Requested that she state whether or not she is ok for our office to make a referral to an endocrinologist to help manage diabetes. Finally need to set up a follow up appointment with Dr. Denman George in one month.  Stated that her post op appointment on 02-22-19 can be converted fo the follow up with Dr. Denman George. Requested that she call back to discuss the above.

## 2019-01-25 ENCOUNTER — Other Ambulatory Visit: Payer: Self-pay | Admitting: Gynecologic Oncology

## 2019-01-25 DIAGNOSIS — N8501 Benign endometrial hyperplasia: Secondary | ICD-10-CM

## 2019-01-25 MED ORDER — MEGESTROL ACETATE 40 MG PO TABS: 40.0000 mg | ORAL_TABLET | Freq: Two times a day (BID) | ORAL | 3 refills | Status: DC

## 2019-01-25 NOTE — Telephone Encounter (Signed)
Outgoing call to patient per last note by Alfonse Ras.  Pt voiced understanding of Megace, its purpose, and  is being called in for her twice a day.  I let Melissa NP know she would like it called in to the [facility] at [place].  Per Dr Denman George and Joylene John NP -referral to endocrinologist, pt stated she prefers to go through her PCP first.  I told her that referral was highly encouraged but pt reports she would like to go 'another route' by seeing her PCP first.  I encouraged her to call their office soon to schedule appt and told her we can send her blood work A1c and urine results to his office and she agreed.  Faxed those results to Attn Dr Hall Busing and added on cover letter- would like pt to have a f/u there ASAP.  Confirmed the next appt for f/u here had been her post op f/u but surgery was cancelled but Dr Denman George would like her to f/u here in 1 month so pt will be keeping the 3/18 appt as scheduled in our office. No other needs per pt at this time.

## 2019-01-25 NOTE — Progress Notes (Signed)
See RN note.

## 2019-02-20 ENCOUNTER — Telehealth: Payer: Self-pay | Admitting: *Deleted

## 2019-02-20 NOTE — Telephone Encounter (Signed)
Patient called back and stated I was calling to cancel my appt. I will call back when I'm ready to reschedule.

## 2019-02-20 NOTE — Telephone Encounter (Signed)
Called and left the patient a message to call the office back regarding her appt on Wednesday

## 2019-02-22 ENCOUNTER — Ambulatory Visit: Payer: Managed Care, Other (non HMO) | Admitting: Gynecologic Oncology

## 2019-12-22 ENCOUNTER — Other Ambulatory Visit: Payer: Self-pay

## 2019-12-22 ENCOUNTER — Ambulatory Visit: Payer: Managed Care, Other (non HMO) | Admitting: Family Medicine

## 2019-12-22 VITALS — BP 130/88 | HR 99 | Temp 98.2°F | Ht 69.0 in | Wt 392.0 lb

## 2019-12-22 DIAGNOSIS — Z794 Long term (current) use of insulin: Secondary | ICD-10-CM

## 2019-12-22 DIAGNOSIS — M25561 Pain in right knee: Secondary | ICD-10-CM | POA: Diagnosis not present

## 2019-12-22 DIAGNOSIS — G8929 Other chronic pain: Secondary | ICD-10-CM

## 2019-12-22 DIAGNOSIS — Z23 Encounter for immunization: Secondary | ICD-10-CM

## 2019-12-22 DIAGNOSIS — Z114 Encounter for screening for human immunodeficiency virus [HIV]: Secondary | ICD-10-CM

## 2019-12-22 DIAGNOSIS — I10 Essential (primary) hypertension: Secondary | ICD-10-CM

## 2019-12-22 DIAGNOSIS — E119 Type 2 diabetes mellitus without complications: Secondary | ICD-10-CM

## 2019-12-22 DIAGNOSIS — M2550 Pain in unspecified joint: Secondary | ICD-10-CM

## 2019-12-22 DIAGNOSIS — Z1159 Encounter for screening for other viral diseases: Secondary | ICD-10-CM

## 2019-12-22 DIAGNOSIS — E785 Hyperlipidemia, unspecified: Secondary | ICD-10-CM

## 2019-12-22 DIAGNOSIS — M25562 Pain in left knee: Secondary | ICD-10-CM

## 2019-12-22 MED ORDER — TRAMADOL HCL 50 MG PO TABS: 50.0000 mg | ORAL_TABLET | Freq: Four times a day (QID) | ORAL | 0 refills | Status: AC | PRN

## 2019-12-22 NOTE — Progress Notes (Signed)
Subjective:  Patient ID: Leslie Mullen, female    DOB: 05-Jan-1961  Age: 59 y.o. MRN: [id number]  CC:  Chief Complaint  Patient presents with  . Establish Care    pt has athritis and believes it is spreading to other parts of her body. getting wosr in pt's hands and in he lower extemities.    HPI Leslie Mullen presents for   Here to establish care.  prior PCP in [facility]. Wanted to be seen closer.  Medical hx:  Diabetes: insulin dependent, last OV in July - unknown A1C on tresiba, metformin, jardiance, and glimepiride. On ACE-I and statin.   Hypertension: Amlodipine, linsinopril hct.  Home readings: BP Readings from Last 3 Encounters:  12/22/19 130/88  01/23/19 (!) 170/93  01/17/19 130/82   Lab Results  Component Value Date   CREATININE 1.37 (H) 12/22/2019   GERD: prilosec 18m daily needed. No hx of PUD.   Knee arthritis: Prior rx for tramadol one per night for bilateral knee arthritis. Evaluated by ortho years ago - no recent eval. . Unable to do surgery until weight under 300. Increasing pain - tramadol less effective.  Last injection by PCP in November 2018. S/p 2 rounds of flexogenix in 2018 - viscosupplementaion.  Topical voltaren did not help.  Ibuprofen - occasionally only - 8022monce per day.  Tylenol 2 times per day. Other aches - shoulders past 1.5 months, side of hips, sometimes pain in hands, cramps. Some knuckle swelling at times.stiff in the morning, improves during the day.  No hx of RA or other inflammatory arthritis.   Controlled substance database (PDMP) reviewed. No concerns appreciated. Last tramadol rx 11/06/19. # 30.   No alcohol., no IDU.    Body mass index is 57.89 kg/m. Wt Readings from Last 3 Encounters:  12/22/19 (!) 392 lb (177.8 kg)  01/23/19 (!) 400 lb 1.6 oz (181.5 kg)  01/17/19 (!) 392 lb (177.8 kg)      History Patient Active Problem List   Diagnosis Date Noted  . Abnormal feces   . Benign neoplasm of ascending colon    . Benign neoplasm of cecum   . Benign neoplasm of transverse colon    Past Medical History:  Diagnosis Date  . Abnormal uterine bleeding   . Allergy   . Cataract    BOTH EYES  . Diabetes ([facility]   type II   . Diabetic neuropathy ([place]  . GERD (gastroesophageal reflux disease)   . Hyperlipidemia   . Hypertension   . Morbid obesity ([facility]  . Osteoarthritis    right knee    Past Surgical History:  Procedure Laterality Date  . CESAREAN SECTION     x3  . COLONOSCOPY WITH PROPOFOL N/A 06/29/2017   Procedure: COLONOSCOPY WITH PROPOFOL;  Surgeon: PeIrene ShipperMD;  Location: [facility];  Service: Endoscopy;  Laterality: N/A;  . CYSTECTOMY Right 2010   Cyst removed from right groin.    Allergies  Allergen Reactions  . Penicillins Shortness Of Breath, Itching and Swelling    Has patient had a PCN reaction causing immediate rash, facial/tongue/throat swelling, SOB or lightheadedness with hypotension: Yes Has patient had a PCN reaction causing severe rash involving mucus membranes or skin necrosis:No Has patient had a PCN reaction that required hospitalization: No Has patient had a PCN reaction occurring within the last 10 years: no If all of the above answers are "NO", then may proceed with Cephalosporin use.     Prior  to Admission medications   Medication Sig Start Date End Date Taking? Authorizing Provider  amLODipine (NORVASC) 10 MG tablet Take 10 mg by mouth at bedtime.    Yes [provider]  CINNAMON PO Take 1,000 mg by mouth 2 (two) times daily.    Yes [provider]  glimepiride (AMARYL) 4 MG tablet Take 4 mg by mouth 2 (two) times daily.    Yes [provider]  glucose blood (ONE TOUCH ULTRA TEST) test strip  07/24/15  Yes [provider]  Insulin Degludec (TRESIBA FLEXTOUCH) 200 UNIT/ML SOPN Inject 70 Units into the muscle every evening.  02/25/17  Yes [provider]  Insulin Pen Needle (B-Mullen UF III MINI PEN NEEDLES) 31G X 5  MM MISC  08/01/15  Yes [provider]  JARDIANCE 25 MG TABS tablet Take 25 mg by mouth daily.  03/01/17  Yes [provider]  lisinopril-hydrochlorothiazide (PRINZIDE,ZESTORETIC) 20-25 MG tablet Take 1 tablet by mouth daily.   Yes [provider]  metFORMIN (GLUCOPHAGE) 1000 MG tablet Take 1,000 mg by mouth 2 (two) times daily with a meal.   Yes [provider]  Misc. Devices [place]  11/17/13  Yes [provider]  Multiple Vitamins-Minerals (MULTIVITAMIN WITH MINERALS) tablet Take 1 tablet by mouth daily.   Yes [provider]  Omega-3 Fatty Acids (FISH OIL) 1000 MG CAPS Take 1,000 mg by mouth 2 (two) times daily.   Yes [provider]  omeprazole (PRILOSEC) 20 MG capsule Take 20 mg by mouth at bedtime.  12/17/15  Yes [provider]  ONETOUCH DELICA LANCETS 23R MISC  08/01/15  Yes [provider]  pravastatin (PRAVACHOL) 40 MG tablet Take 40 mg by mouth at bedtime.    Yes [provider]  ibuprofen (ADVIL,MOTRIN) 800 MG tablet Take 800 mg by mouth every 8 (eight) hours as needed.    [provider]  megestrol (MEGACE) 40 MG tablet Take 1 tablet (40 mg total) by mouth 2 (two) times daily. Patient not taking: Reported on 12/22/2019 01/25/19   Leslie John D, NP  Misc Natural Products (OSTEO BI-FLEX ADV DOUBLE ST PO) Take 1 tablet by mouth 2 (two) times daily.    [provider]  oxyCODONE (OXY IR/ROXICODONE) 5 MG immediate release tablet Take 1 tablet (5 mg total) by mouth every 4 (four) hours as needed for severe pain. For AFTER surgery, Do not take and drive Patient not taking: Reported on 12/22/2019 01/17/19   Leslie John D, NP  senna-docusate (SENOKOT-S) 8.6-50 MG tablet Take 2 tablets by mouth at bedtime. For AFTER surgery, hold if having loose stools Patient not taking: Reported on 12/22/2019 01/17/19   Leslie Gibbs, NP   Social History   Socioeconomic History  . Marital status:  Married    Spouse name: Not on file  . Number of children: 2  . Years of education: Not on file  . Highest education level: Not on file  Occupational History  . Occupation: Intake Coordinator  Tobacco Use  . Smoking status: Former Research scientist (life sciences)  . Smokeless tobacco: Never Used  . Tobacco comment: 18 years ago.  Substance and Sexual Activity  . Alcohol use: No    Alcohol/week: 0.0 standard drinks  . Drug use: No  . Sexual activity: Not on file  Other Topics Concern  . Not on file  Social History Narrative  . Not on file   Social Determinants of Health   Financial Resource Strain:   .  Difficulty of Paying Living Expenses: Not on file  Food Insecurity:   . Worried About Charity fundraiser in the Last Year: Not on file  . Ran Out of Food in the Last Year: Not on file  Transportation Needs:   . Lack of Transportation (Medical): Not on file  . Lack of Transportation (Non-Medical): Not on file  Physical Activity:   . Days of Exercise per Week: Not on file  . Minutes of Exercise per Session: Not on file  Stress:   . Feeling of Stress : Not on file  Social Connections:   . Frequency of Communication with Friends and Family: Not on file  . Frequency of Social Gatherings with Friends and Family: Not on file  . Attends Religious Services: Not on file  . Active Member of Clubs or Organizations: Not on file  . Attends Archivist Meetings: Not on file  . Marital Status: Not on file  Intimate Partner Violence:   . Fear of Current or Ex-Partner: Not on file  . Emotionally Abused: Not on file  . Physically Abused: Not on file  . Sexually Abused: Not on file    Review of Systems  Constitutional: Negative for fatigue and unexpected weight change.  Respiratory: Negative for chest tightness and shortness of breath.   Cardiovascular: Negative for chest pain, palpitations and leg swelling.  Gastrointestinal: Negative for abdominal pain and blood in stool.  Musculoskeletal: Positive  for arthralgias and joint swelling.  Neurological: Negative for dizziness, syncope, light-headedness and headaches.     Objective:   Vitals:   12/22/19 1548  BP: 130/88  Pulse: 99  Temp: 98.2 F (36.8 C)  TempSrc: Temporal  SpO2: 94%  Weight: (!) 392 lb (177.8 kg)  Height: '5\' 9"'  (1.753 m)     Physical Exam Vitals reviewed.  Constitutional:      General: She is not in acute distress.    Appearance: She is well-developed. She is obese. She is not ill-appearing.  HENT:     Head: Normocephalic and atraumatic.  Eyes:     Conjunctiva/sclera: Conjunctivae normal.     Pupils: Pupils are equal, round, and reactive to light.  Neck:     Vascular: No carotid bruit.  Cardiovascular:     Rate and Rhythm: Normal rate and regular rhythm.     Heart sounds: Normal heart sounds.  Pulmonary:     Effort: Pulmonary effort is normal.     Breath sounds: Normal breath sounds.  Abdominal:     Palpations: Abdomen is soft. There is no pulsatile mass.     Tenderness: There is no abdominal tenderness.  Musculoskeletal:     Right shoulder: Decreased range of motion (Range of motion overall equal but slow, discomfort with range of motion.  No apparent RTC weakness, somewhat guarded exam.).     Left shoulder: Decreased range of motion.     Right hip: Tenderness (Lateral hip bilaterally over the trochanteric bursa.) present.     Left hip: Tenderness present.     Right knee: Tenderness present over the medial joint line and lateral joint line.     Left knee: Tenderness (Bilateral knees, diffuse tenderness, primarily medial greater than lateral.  Difficult to assess effusion with body habitus.  Ambulates with cane.) present over the medial joint line and lateral joint line.  Skin:    General: Skin is warm and dry.  Neurological:     Mental Status: She is alert and oriented to person, place, and  time.  Psychiatric:        Behavior: Behavior normal.      Assessment & Plan:  Serria Sloma is a 59  y.o. female . Type 2 diabetes mellitus without complication, with long-term current use of insulin ([place]) - Plan: Comprehensive metabolic panel, Hemoglobin A1c  -Check labs with plan follow-up to review regimen and to evaluate for potential changes  Need for prophylactic vaccination and inoculation against influenza - Plan: Flu Vaccine QUAD 6+ mos PF IM (Fluarix Quad PF)  Polyarthralgia - Plan: Sedimentation Rate, Rheumatoid factor, Uric Acid  -Less likely inflammatory arthritis, will screen with sed rate, rheumatoid factor, uric acid, follow-up to discuss further.  -May have component of trochanteric bursitis with bilateral hip pain, rotator cuff tendinosis possible but has intact range of motion.  Evaluate further at recheck or can discuss with orthopedics, referral placed below..   Chronic pain of both knees - Plan: traMADol (ULTRAM) 50 MG tablet, Ambulatory referral to Orthopedic Surgery  -Reportedly not a candidate for surgery in the past due to BMI.  Still would recommend evaluation with orthopedics to consider injection.  Option, consider pain management eval for other treatments.  Agreed to refill tramadol for now.  Controlled substance registry reviewed.  Essential hypertension - Plan: Comprehensive metabolic panel  -Stable, check labs, review meds next visit further.  Denies need for refills at this time.  Encounter for hepatitis C screening test for low risk patient - Plan: Hepatitis C antibody  Screening for HIV (human immunodeficiency virus) - Plan: CANCELED: HIV antibody  Hyperlipidemia, unspecified hyperlipidemia type - Plan: Lipid panel  -Check labs, no med changes at this time, plan for follow-up visit to review further  Meds ordered this encounter  Medications  . traMADol (ULTRAM) 50 MG tablet    Sig: Take 1 tablet (50 mg total) by mouth every 6 (six) hours as needed for up to 5 days.    Dispense:  30 tablet    Refill:  0   Patient Instructions     I will check some  baseline labs today.  No medication changes today I did refill tramadol temporarily until you are seen by orthopedics.  Potentially can refer you to pain management if needed.  I will check some inflammation test for the pain in the shoulders, hips, other joints but would like to follow-up to evaluate those areas further.  Initially follow-up in 2 weeks for a telemedicine visit to review your labs and symptoms further.  Okay to continue Tylenol, up to 4 times per day, Osteo Bi-Flex is okay to continue as well.  Try to minimize use of ibuprofen, especially long-term.  Hip pain may be trochanteric bursitis, see information below.  That can also be discussed with orthopedics.  Nice meeting you today.  Will talk more in the next few weeks.  Return to the clinic or go to the nearest emergency room if any of your symptoms worsen or new symptoms occur.    Hip Bursitis  Hip bursitis is inflammation of a fluid-filled sac (bursa) in the hip joint. The bursa prevents the bones in the hip joint from rubbing against each other. Hip bursitis can cause mild to moderate pain, and symptoms often come and go over time. What are the causes? This condition may be caused by:  Injury to the hip.  Overuse of the muscles that surround the hip joint.  Previous injury or surgery of the hip.  Arthritis or gout.  Diabetes.  Thyroid disease.  Infection.  In some cases, the cause may not be known. What are the signs or symptoms? Symptoms of this condition include:  Mild or moderate pain in the hip area. Pain may get worse with movement.  Tenderness and swelling of the hip, especially on the outer side of the hip.  In rare cases, the bursa may become infected. This may cause a fever, as well as warmth and redness in the area. Symptoms may come and go. How is this diagnosed? This condition may be diagnosed based on:  A physical exam.  Your medical history.  X-rays.  Removal of fluid from your inflamed  bursa for testing (biopsy). You may be sent to a health care provider who specializes in bone diseases (orthopedist) or a provider who specializes in joint inflammation (rheumatologist). How is this treated? This condition is treated by resting, icing, applying pressure (compression), and raising (elevating) the injured area. This is called RICE treatment. In some cases, this may be enough to make your symptoms go away. Treatment may also include:  Using crutches.  Draining fluid out of the bursa to help relieve swelling.  Injecting medicine that helps to reduce inflammation (cortisone).  Additional medicines if the bursa is infected. Follow these instructions at home: Managing pain, stiffness, and swelling   If directed, put ice on the painful area. ? Put ice in a plastic bag. ? Place a towel between your skin and the bag. ? Leave the ice on for 20 minutes, 2-3 times a day. ? Raise (elevate) your hip above the level of your heart as much as you can without pain. To do this, try putting a pillow under your hips while you lie down. Activity  Return to your normal activities as told by your health care provider. Ask your health care provider what activities are safe for you.  Rest and protect your hip as much as possible until your pain and swelling get better. General instructions  Take over-the-counter and prescription medicines only as told by your health care provider.  Wear compression wraps only as told by your health care provider.  Do not use your hip to support your body weight until your health care provider says that you can. Use crutches as told by your health care provider.  Gently massage and stretch your injured area as often as is comfortable.  Keep all follow-up visits as told by your health care provider. This is important. How is this prevented?  Exercise regularly, as told by your health care provider.  Warm up and stretch before being active.  Cool down  and stretch after being active.  If an activity irritates your hip or causes pain, avoid the activity as much as possible.  Avoid sitting down for long periods at a time. Contact a health care provider if you:  Have a fever.  Develop new symptoms.  Have difficulty walking or doing everyday activities.  Have pain that gets worse or does not get better with medicine.  Develop red skin or a feeling of warmth in your hip area. Get help right away if you:  Cannot move your hip.  Have severe pain. Summary  Hip bursitis is inflammation of a fluid-filled sac (bursa) in the hip joint.  Hip bursitis can cause mild to moderate pain, and symptoms often come and go over time.  This condition is treated with rest, ice, compression, elevation, and medicines. This information is not intended to replace advice given to you by your health care provider. Make sure  you discuss any questions you have with your health care provider. Document Revised: 08/01/2018 Document Reviewed: 08/01/2018 Elsevier Patient Education  [place].  Chronic Knee Pain, Adult Chronic knee pain is pain in one or both knees that lasts longer than 3 months. Symptoms of chronic knee pain may include swelling, stiffness, and discomfort. Age-related wear and tear (osteoarthritis) of the knee joint is the most common cause of chronic knee pain. Other possible causes include:  A long-term immune-related disease that causes inflammation of the knee (rheumatoid arthritis). This usually affects both knees.  Inflammatory arthritis, such as gout or pseudogout.  An injury to the knee that causes arthritis.  An injury to the knee that damages the ligaments. Ligaments are strong tissues that connect bones to each other.  Runner's knee or pain behind the kneecap. Treatment for chronic knee pain depends on the cause. The main treatments for chronic knee pain are physical therapy and weight loss. This condition may also be  treated with medicines, injections, a knee sleeve or brace, and by using crutches. Rest, ice, compression (pressure), and elevation (RICE) therapy may also be recommended. Follow these instructions at home: If you have a knee sleeve or brace:   Wear it as told by your health care provider. Remove it only as told by your health care provider.  Loosen it if your toes tingle, become numb, or turn cold and blue.  Keep it clean.  If the sleeve or brace is not waterproof: ? Do not let it get wet. ? Remove it if allowed by your health care provider, or cover it with a watertight covering when you take a bath or a shower. Managing pain, stiffness, and swelling      If directed, apply heat to the affected area as often as told by your health care provider. Use the heat source that your health care provider recommends, such as a moist heat pack or a heating pad. ? If you have a removable sleeve or brace, remove it as told by your health care provider. ? Place a towel between your skin and the heat source. ? Leave the heat on for 20-30 minutes. ? Remove the heat if your skin turns bright red. This is especially important if you are unable to feel pain, heat, or cold. You may have a greater risk of getting burned.  If directed, put ice on the affected area. ? If you have a removable sleeve or brace, remove it as told by your health care provider. ? Put ice in a plastic bag. ? Place a towel between your skin and the bag. ? Leave the ice on for 20 minutes, 2-3 times a day.  Move your toes often to reduce stiffness and swelling.  Raise (elevate) the injured area above the level of your heart while you are sitting or lying down. Activity  Avoid activities where both feet leave the ground at the same time (high-impact activities). Examples are running, jumping rope, and doing jumping jacks.  Return to your normal activities as told by your health care provider. Ask your health care provider what  activities are safe for you.  Follow the exercise plan that your health care provider designed for you. Your health care provider may suggest that you: ? Avoid activities that make knee pain worse. This may require you to change your exercise routines, sport participation, or job duties. ? Wear shoes with cushioned soles. ? Avoid high-impact activities or sports that require running and sudden changes  in direction. ? Do physical therapy as told by your health care provider. Physical therapy is planned to match your needs and abilities. It may include exercises for strength, flexibility, stability, and endurance. ? Do exercises that increase balance and strength, such as tai chi and yoga.  Do not use the injured limb to support your body weight until your health care provider says that you can. Use crutches, a cane, or a walker, as told by your health care provider. General instructions  Take over-the-counter and prescription medicines only as told by your health care provider.  Lose weight if you are overweight. Losing even a little weight can reduce knee pain. Ask your health care provider what your ideal weight is, and how to safely lose extra weight. A food expert (dietitian) may be able to help you plan your meals.  Do not use any products that contain nicotine or tobacco, such as cigarettes, e-cigarettes, and chewing tobacco. These can delay healing. If you need help quitting, ask your health care provider.  Keep all follow-up visits as told by your health care provider. This is important. Contact a health care provider if:  You have knee pain that is not getting better or gets worse.  You are unable to do your physical therapy exercises due to knee pain. Get help right away if:  Your knee swells and the swelling becomes worse.  You cannot move your knee.  You have severe knee pain. Summary  Knee pain that lasts more than 3 months is considered chronic knee pain.  The main  treatments for chronic knee pain are physical therapy and weight loss. You may also need to take medicines, wear a knee sleeve or brace, use crutches, and apply ice or heat.  Losing even a little weight can reduce knee pain. Ask your health care provider what your ideal weight is, and how to safely lose extra weight. A food expert (dietitian) may be able to help you plan your meals.  Work with a physical therapist to make a safe exercise program, as told by your health care provider. This information is not intended to replace advice given to you by your health care provider. Make sure you discuss any questions you have with your health care provider. Document Revised: 02/02/2019 Document Reviewed: 02/02/2019 Elsevier Patient Education  [employer].    If you have lab work done today you will be contacted with your lab results within the next 2 weeks.  If you have not heard from [place] then please contact us. The fastest way to get your results is to register for My Chart.   IF you received an x-ray today, you will receive an invoice from [facility]. Please contact [facility] at [phone] with questions or concerns regarding your invoice.   IF you received labwork today, you will receive an invoice from [place]. Please contact [facility] at [phone] with questions or concerns regarding your invoice.   Our billing staff will not be able to assist you with questions regarding bills from these companies.  You will be contacted with the lab results as soon as they are available. The fastest way to get your results is to activate your My Chart account. Instructions are located on the last page of this paperwork. If you have not heard from [place] regarding the results in 2 weeks, please contact this office.         Signed, Merri Ray, MD [facility]

## 2019-12-22 NOTE — Patient Instructions (Addendum)
I will check some baseline labs today.  No medication changes today I did refill tramadol temporarily until you are seen by orthopedics.  Potentially can refer you to pain management if needed.  I will check some inflammation test for the pain in the shoulders, hips, other joints but would like to follow-up to evaluate those areas further.  Initially follow-up in 2 weeks for a telemedicine visit to review your labs and symptoms further.  Okay to continue Tylenol, up to 4 times per day, Osteo Bi-Flex is okay to continue as well.  Try to minimize use of ibuprofen, especially long-term.  Hip pain may be trochanteric bursitis, see information below.  That can also be discussed with orthopedics.  Nice meeting you today.  Will talk more in the next few weeks.  Return to the clinic or go to the nearest emergency room if any of your symptoms worsen or new symptoms occur.    Hip Bursitis  Hip bursitis is inflammation of a fluid-filled sac (bursa) in the hip joint. The bursa prevents the bones in the hip joint from rubbing against each other. Hip bursitis can cause mild to moderate pain, and symptoms often come and go over time. What are the causes? This condition may be caused by:  Injury to the hip.  Overuse of the muscles that surround the hip joint.  Previous injury or surgery of the hip.  Arthritis or gout.  Diabetes.  Thyroid disease.  Infection. In some cases, the cause may not be known. What are the signs or symptoms? Symptoms of this condition include:  Mild or moderate pain in the hip area. Pain may get worse with movement.  Tenderness and swelling of the hip, especially on the outer side of the hip.  In rare cases, the bursa may become infected. This may cause a fever, as well as warmth and redness in the area. Symptoms may come and go. How is this diagnosed? This condition may be diagnosed based on:  A physical exam.  Your medical history.  X-rays.  Removal of  fluid from your inflamed bursa for testing (biopsy). You may be sent to a health care provider who specializes in bone diseases (orthopedist) or a provider who specializes in joint inflammation (rheumatologist). How is this treated? This condition is treated by resting, icing, applying pressure (compression), and raising (elevating) the injured area. This is called RICE treatment. In some cases, this may be enough to make your symptoms go away. Treatment may also include:  Using crutches.  Draining fluid out of the bursa to help relieve swelling.  Injecting medicine that helps to reduce inflammation (cortisone).  Additional medicines if the bursa is infected. Follow these instructions at home: Managing pain, stiffness, and swelling   If directed, put ice on the painful area. ? Put ice in a plastic bag. ? Place a towel between your skin and the bag. ? Leave the ice on for 20 minutes, 2-3 times a day. ? Raise (elevate) your hip above the level of your heart as much as you can without pain. To do this, try putting a pillow under your hips while you lie down. Activity  Return to your normal activities as told by your health care provider. Ask your health care provider what activities are safe for you.  Rest and protect your hip as much as possible until your pain and swelling get better. General instructions  Take over-the-counter and prescription medicines only as told by your health care provider.  Wear compression wraps only as told by your health care provider.  Do not use your hip to support your body weight until your health care provider says that you can. Use crutches as told by your health care provider.  Gently massage and stretch your injured area as often as is comfortable.  Keep all follow-up visits as told by your health care provider. This is important. How is this prevented?  Exercise regularly, as told by your health care provider.  Warm up and stretch before  being active.  Cool down and stretch after being active.  If an activity irritates your hip or causes pain, avoid the activity as much as possible.  Avoid sitting down for long periods at a time. Contact a health care provider if you:  Have a fever.  Develop new symptoms.  Have difficulty walking or doing everyday activities.  Have pain that gets worse or does not get better with medicine.  Develop red skin or a feeling of warmth in your hip area. Get help right away if you:  Cannot move your hip.  Have severe pain. Summary  Hip bursitis is inflammation of a fluid-filled sac (bursa) in the hip joint.  Hip bursitis can cause mild to moderate pain, and symptoms often come and go over time.  This condition is treated with rest, ice, compression, elevation, and medicines. This information is not intended to replace advice given to you by your health care provider. Make sure you discuss any questions you have with your health care provider. Document Revised: 08/01/2018 Document Reviewed: 08/01/2018 Elsevier Patient Education  [employer].  Chronic Knee Pain, Adult Chronic knee pain is pain in one or both knees that lasts longer than 3 months. Symptoms of chronic knee pain may include swelling, stiffness, and discomfort. Age-related wear and tear (osteoarthritis) of the knee joint is the most common cause of chronic knee pain. Other possible causes include:  A long-term immune-related disease that causes inflammation of the knee (rheumatoid arthritis). This usually affects both knees.  Inflammatory arthritis, such as gout or pseudogout.  An injury to the knee that causes arthritis.  An injury to the knee that damages the ligaments. Ligaments are strong tissues that connect bones to each other.  Runner's knee or pain behind the kneecap. Treatment for chronic knee pain depends on the cause. The main treatments for chronic knee pain are physical therapy and weight loss.  This condition may also be treated with medicines, injections, a knee sleeve or brace, and by using crutches. Rest, ice, compression (pressure), and elevation (RICE) therapy may also be recommended. Follow these instructions at home: If you have a knee sleeve or brace:   Wear it as told by your health care provider. Remove it only as told by your health care provider.  Loosen it if your toes tingle, become numb, or turn cold and blue.  Keep it clean.  If the sleeve or brace is not waterproof: ? Do not let it get wet. ? Remove it if allowed by your health care provider, or cover it with a watertight covering when you take a bath or a shower. Managing pain, stiffness, and swelling      If directed, apply heat to the affected area as often as told by your health care provider. Use the heat source that your health care provider recommends, such as a moist heat pack or a heating pad. ? If you have a removable sleeve or brace, remove it as told by  your health care provider. ? Place a towel between your skin and the heat source. ? Leave the heat on for 20-30 minutes. ? Remove the heat if your skin turns bright red. This is especially important if you are unable to feel pain, heat, or cold. You may have a greater risk of getting burned.  If directed, put ice on the affected area. ? If you have a removable sleeve or brace, remove it as told by your health care provider. ? Put ice in a plastic bag. ? Place a towel between your skin and the bag. ? Leave the ice on for 20 minutes, 2-3 times a day.  Move your toes often to reduce stiffness and swelling.  Raise (elevate) the injured area above the level of your heart while you are sitting or lying down. Activity  Avoid activities where both feet leave the ground at the same time (high-impact activities). Examples are running, jumping rope, and doing jumping jacks.  Return to your normal activities as told by your health care provider. Ask  your health care provider what activities are safe for you.  Follow the exercise plan that your health care provider designed for you. Your health care provider may suggest that you: ? Avoid activities that make knee pain worse. This may require you to change your exercise routines, sport participation, or job duties. ? Wear shoes with cushioned soles. ? Avoid high-impact activities or sports that require running and sudden changes in direction. ? Do physical therapy as told by your health care provider. Physical therapy is planned to match your needs and abilities. It may include exercises for strength, flexibility, stability, and endurance. ? Do exercises that increase balance and strength, such as tai chi and yoga.  Do not use the injured limb to support your body weight until your health care provider says that you can. Use crutches, a cane, or a walker, as told by your health care provider. General instructions  Take over-the-counter and prescription medicines only as told by your health care provider.  Lose weight if you are overweight. Losing even a little weight can reduce knee pain. Ask your health care provider what your ideal weight is, and how to safely lose extra weight. A food expert (dietitian) may be able to help you plan your meals.  Do not use any products that contain nicotine or tobacco, such as cigarettes, e-cigarettes, and chewing tobacco. These can delay healing. If you need help quitting, ask your health care provider.  Keep all follow-up visits as told by your health care provider. This is important. Contact a health care provider if:  You have knee pain that is not getting better or gets worse.  You are unable to do your physical therapy exercises due to knee pain. Get help right away if:  Your knee swells and the swelling becomes worse.  You cannot move your knee.  You have severe knee pain. Summary  Knee pain that lasts more than 3 months is considered  chronic knee pain.  The main treatments for chronic knee pain are physical therapy and weight loss. You may also need to take medicines, wear a knee sleeve or brace, use crutches, and apply ice or heat.  Losing even a little weight can reduce knee pain. Ask your health care provider what your ideal weight is, and how to safely lose extra weight. A food expert (dietitian) may be able to help you plan your meals.  Work with a physical therapist to make  a safe exercise program, as told by your health care provider. This information is not intended to replace advice given to you by your health care provider. Make sure you discuss any questions you have with your health care provider. Document Revised: 02/02/2019 Document Reviewed: 02/02/2019 Elsevier Patient Education  [employer].    If you have lab work done today you will be contacted with your lab results within the next 2 weeks.  If you have not heard from [place] then please contact us. The fastest way to get your results is to register for My Chart.   IF you received an x-ray today, you will receive an invoice from [facility]. Please contact [facility] at [phone] with questions or concerns regarding your invoice.   IF you received labwork today, you will receive an invoice from [facility]. Please contact [facility] at [phone] with questions or concerns regarding your invoice.   Our billing staff will not be able to assist you with questions regarding bills from these companies.  You will be contacted with the lab results as soon as they are available. The fastest way to get your results is to activate your My Chart account. Instructions are located on the last page of this paperwork. If you have not heard from [place] regarding the results in 2 weeks, please contact this office.

## 2019-12-23 ENCOUNTER — Encounter: Payer: Self-pay | Admitting: Family Medicine

## 2019-12-23 LAB — LIPID PANEL
Chol/HDL Ratio: 3.3 ratio (ref 0.0–4.4)
Cholesterol, Total: 155 mg/dL (ref 100–199)
HDL: 47 mg/dL (ref >39)
LDL Chol Calc (NIH): 69 mg/dL (ref 0–99)
Triglycerides: 238 mg/dL — ABNORMAL HIGH (ref 0–149)
VLDL Cholesterol Cal: 39 mg/dL (ref 5–40)

## 2019-12-23 LAB — COMPREHENSIVE METABOLIC PANEL
ALT: 17 IU/L (ref 0–32)
AST: 19 IU/L (ref 0–40)
Albumin/Globulin Ratio: 1.1 — ABNORMAL LOW (ref 1.2–2.2)
Albumin: 3.6 g/dL — ABNORMAL LOW (ref 3.8–4.9)
Alkaline Phosphatase: 90 IU/L (ref 39–117)
BUN/Creatinine Ratio: 18 (ref 9–23)
BUN: 25 mg/dL — ABNORMAL HIGH (ref 6–24)
Bilirubin Total: <0.2 mg/dL (ref 0.0–1.2)
CO2: 25 mmol/L (ref 20–29)
Calcium: 9.8 mg/dL (ref 8.7–10.2)
Chloride: 99 mmol/L (ref 96–106)
Creatinine, Ser: 1.37 mg/dL — ABNORMAL HIGH (ref 0.57–1.00)
GFR calc Af Amer: 49 mL/min/{1.73_m2} — ABNORMAL LOW (ref >59)
GFR calc non Af Amer: 43 mL/min/{1.73_m2} — ABNORMAL LOW (ref >59)
Globulin, Total: 3.4 g/dL (ref 1.5–4.5)
Glucose: 254 mg/dL — ABNORMAL HIGH (ref 65–99)
Potassium: 4.8 mmol/L (ref 3.5–5.2)
Sodium: 137 mmol/L (ref 134–144)
Total Protein: 7 g/dL (ref 6.0–8.5)

## 2019-12-23 LAB — URIC ACID: Uric Acid: 7.5 mg/dL — ABNORMAL HIGH (ref 3.0–7.2)

## 2019-12-23 LAB — HEMOGLOBIN A1C
Est. average glucose Bld gHb Est-mCnc: 326 mg/dL
Hgb A1c MFr Bld: 13 % — ABNORMAL HIGH (ref 4.8–5.6)

## 2019-12-23 LAB — RHEUMATOID FACTOR: Rheumatoid fact SerPl-aCnc: <10 IU/mL (ref 0.0–13.9)

## 2019-12-23 LAB — SEDIMENTATION RATE: Sed Rate: 54 mm/hr — ABNORMAL HIGH (ref 0–40)

## 2019-12-23 LAB — HEPATITIS C ANTIBODY: Hep C Virus Ab: <0.1 s/co ratio (ref 0.0–0.9)

## 2019-12-29 ENCOUNTER — Ambulatory Visit: Payer: Self-pay | Admitting: Internal Medicine

## 2020-01-05 ENCOUNTER — Other Ambulatory Visit: Payer: Self-pay

## 2020-01-05 ENCOUNTER — Telehealth (INDEPENDENT_AMBULATORY_CARE_PROVIDER_SITE_OTHER): Payer: Self-pay | Admitting: Family Medicine

## 2020-01-05 VITALS — Ht 69.0 in | Wt 392.4 lb

## 2020-01-05 DIAGNOSIS — G2581 Restless legs syndrome: Secondary | ICD-10-CM

## 2020-01-05 DIAGNOSIS — I10 Essential (primary) hypertension: Secondary | ICD-10-CM

## 2020-01-05 DIAGNOSIS — K219 Gastro-esophageal reflux disease without esophagitis: Secondary | ICD-10-CM

## 2020-01-05 DIAGNOSIS — M2550 Pain in unspecified joint: Secondary | ICD-10-CM

## 2020-01-05 DIAGNOSIS — Z794 Long term (current) use of insulin: Secondary | ICD-10-CM

## 2020-01-05 DIAGNOSIS — E785 Hyperlipidemia, unspecified: Secondary | ICD-10-CM

## 2020-01-05 DIAGNOSIS — E1165 Type 2 diabetes mellitus with hyperglycemia: Secondary | ICD-10-CM

## 2020-01-05 DIAGNOSIS — G8929 Other chronic pain: Secondary | ICD-10-CM

## 2020-01-05 DIAGNOSIS — M25561 Pain in right knee: Secondary | ICD-10-CM

## 2020-01-05 DIAGNOSIS — M25562 Pain in left knee: Secondary | ICD-10-CM

## 2020-01-05 DIAGNOSIS — E1142 Type 2 diabetes mellitus with diabetic polyneuropathy: Secondary | ICD-10-CM

## 2020-01-05 MED ORDER — JARDIANCE 25 MG PO TABS: 25.0000 mg | ORAL_TABLET | Freq: Every day | ORAL | 1 refills | Status: DC

## 2020-01-05 MED ORDER — GABAPENTIN 300 MG PO CAPS: ORAL_CAPSULE | ORAL | 0 refills | Status: DC

## 2020-01-05 MED ORDER — GLIMEPIRIDE 4 MG PO TABS: 4.0000 mg | ORAL_TABLET | Freq: Two times a day (BID) | ORAL | 1 refills | Status: DC

## 2020-01-05 MED ORDER — OMEPRAZOLE 20 MG PO CPDR: 20.0000 mg | DELAYED_RELEASE_CAPSULE | Freq: Every day | ORAL | 1 refills | Status: DC

## 2020-01-05 MED ORDER — TRAMADOL HCL 50 MG PO TABS: 50.0000 mg | ORAL_TABLET | Freq: Four times a day (QID) | ORAL | 0 refills | Status: DC | PRN

## 2020-01-05 MED ORDER — LISINOPRIL-HYDROCHLOROTHIAZIDE 20-25 MG PO TABS: 1.0000 | ORAL_TABLET | Freq: Every day | ORAL | 1 refills | Status: DC

## 2020-01-05 MED ORDER — METFORMIN HCL 1000 MG PO TABS: 1000.0000 mg | ORAL_TABLET | Freq: Two times a day (BID) | ORAL | 1 refills | Status: DC

## 2020-01-05 MED ORDER — PRAVASTATIN SODIUM 40 MG PO TABS: 40.0000 mg | ORAL_TABLET | Freq: Every day | ORAL | 1 refills | Status: DC

## 2020-01-05 MED ORDER — AMLODIPINE BESYLATE 10 MG PO TABS: 10.0000 mg | ORAL_TABLET | Freq: Every day | ORAL | 1 refills | Status: DC

## 2020-01-05 MED ORDER — TRESIBA FLEXTOUCH 200 UNIT/ML ~~LOC~~ SOPN: 80.0000 [IU] | PEN_INJECTOR | Freq: Every evening | SUBCUTANEOUS | 1 refills | Status: DC

## 2020-01-05 NOTE — Progress Notes (Signed)
Virtual Visit via Video Note  I connected with Leslie Mullen on 01/06/20 at 11:15 AM by a video enabled telemedicine application and verified that I am speaking with the correct person using two identifiers.   I discussed the limitations, risks, security and privacy concerns of performing an evaluation and management service by telephone and the availability of in person appointments. I also discussed with the patient that there may be a patient responsible charge related to this service. The patient expressed understanding and agreed to proceed, consent obtained  Chief complaint:  Chief Complaint  Patient presents with  . Follow-up    on pt's diabetes. pt hasn't had any issues with her diabetes since last visit. pt most recent blodd sugar was 132 mg/dL fasting. pt would like to go over labs from last visit.    History of Present Illness: Leslie Mullen is a 59 y.o. female  Follow-up from establish care visit, multiple concerns that were addressed January 15.  Diabetes: Insulin-dependent, complicated by hyperglycemia. On gabapentin for RLS, but also burning/tingling in feet - uses gabapentin in day as well. chronic kidney disease with GFR 49, creatinine 1.37 last visit (creatinine 1.26 in February 2020)  New patient to me January 15, previously had been followed by primary provider in [place].  No current endocrinologist. Uncontrolled with A1c of 13.0 at last visit, glucose 254. Takes Metformin 1000 mg twice daily, Jardiance 25 mg daily, Tresiba FlexTouch 80 units nightly, glimepiride 4 mg twice daily.  She notes that she misses morning doses of meds at times (jardiance, metformin, lisinopril, glimepiride, gabapentin)  4 times only, otherwise med adherent. No missed doses in past few weeks.  No missed doses of insulin.  Home readings past week: Fasting (no 2 hr PP):209, 134,132 today. No n/v/abd pain.  No symptomatic lows.  Did start improving diet in past week - down 6 pounds.    She is on ACE inhibitor with lisinopril, statin with pravastatin.  Lab Results  Component Value Date   HGBA1C 13.0 (H) 12/22/2019   HGBA1C 9.5 (H) 01/23/2019   Lab Results  Component Value Date   LDLCALC 69 12/22/2019   CREATININE 1.37 (H) 12/22/2019   RLS: Gabapentin 300mg  tid   Knee pain, arthralgias: See last ov. Chronic knee pain, prior ortho eval, hip bursitis suspected.  Tramadol continued for now - taking twice per day. Has been doing better - less knee and hip pain. Other arthralgias better slightly.  Plan for orthopedic follow up. Called today. Plans to call again next week.  Tramadol Rx 1/15 - Controlled substance database (PDMP) reviewed. No concerns appreciated.     Results for orders placed or performed in visit on 12/22/19  Hepatitis C antibody  Result Value Ref Range   Hep C Virus Ab <0.1 0.0 - 0.9 s/co ratio  Comprehensive metabolic panel  Result Value Ref Range   Glucose 254 (H) 65 - 99 mg/dL   BUN 25 (H) 6 - 24 mg/dL   Creatinine, Ser 1.37 (H) 0.57 - 1.00 mg/dL   GFR calc non Af Amer 43 (L) >59 mL/min/1.73   GFR calc Af Amer 49 (L) >59 mL/min/1.73   BUN/Creatinine Ratio 18 9 - 23   Sodium 137 134 - 144 mmol/L   Potassium 4.8 3.5 - 5.2 mmol/L   Chloride 99 96 - 106 mmol/L   CO2 25 20 - 29 mmol/L   Calcium 9.8 8.7 - 10.2 mg/dL   Total Protein 7.0 6.0 - 8.5 g/dL   Albumin 3.6 (  L) 3.8 - 4.9 g/dL   Globulin, Total 3.4 1.5 - 4.5 g/dL   Albumin/Globulin Ratio 1.1 (L) 1.2 - 2.2   Bilirubin Total <0.2 0.0 - 1.2 mg/dL   Alkaline Phosphatase 90 39 - 117 IU/L   AST 19 0 - 40 IU/L   ALT 17 0 - 32 IU/L  Lipid panel  Result Value Ref Range   Cholesterol, Total 155 100 - 199 mg/dL   Triglycerides 238 (H) 0 - 149 mg/dL   HDL 47 >39 mg/dL   VLDL Cholesterol Cal 39 5 - 40 mg/dL   LDL Chol Calc (NIH) 69 0 - 99 mg/dL   Chol/HDL Ratio 3.3 0.0 - 4.4 ratio  Hemoglobin A1c  Result Value Ref Range   Hgb A1c MFr Bld 13.0 (H) 4.8 - 5.6 %   Est. average glucose  Bld gHb Est-mCnc 326 mg/dL  Sedimentation Rate  Result Value Ref Range   Sed Rate 54 (H) 0 - 40 mm/hr  Rheumatoid factor  Result Value Ref Range   Rhuematoid fact SerPl-aCnc <10.0 0.0 - 13.9 IU/mL  Uric Acid  Result Value Ref Range   Uric Acid 7.5 (H) 3.0 - 7.2 mg/dL      Patient Active Problem List   Diagnosis Date Noted  . Abnormal feces   . Benign neoplasm of ascending colon   . Benign neoplasm of cecum   . Benign neoplasm of transverse colon    Past Medical History:  Diagnosis Date  . Abnormal uterine bleeding   . Allergy   . Cataract    BOTH EYES  . Diabetes ([place])    type II   . Diabetic neuropathy ([place])   . GERD (gastroesophageal reflux disease)   . Hyperlipidemia   . Hypertension   . Morbid obesity ([place])   . Osteoarthritis    right knee    Past Surgical History:  Procedure Laterality Date  . CESAREAN SECTION     x3  . COLONOSCOPY WITH PROPOFOL N/A 06/29/2017   Procedure: COLONOSCOPY WITH PROPOFOL;  Surgeon: Irene Shipper, MD;  Location: [facility];  Service: Endoscopy;  Laterality: N/A;  . CYSTECTOMY Right 2010   Cyst removed from right groin.    Allergies  Allergen Reactions  . Penicillins Shortness Of Breath, Itching and Swelling    Has patient had a PCN reaction causing immediate rash, facial/tongue/throat swelling, SOB or lightheadedness with hypotension: Yes Has patient had a PCN reaction causing severe rash involving mucus membranes or skin necrosis:No Has patient had a PCN reaction that required hospitalization: No Has patient had a PCN reaction occurring within the last 10 years: no If all of the above answers are "NO", then may proceed with Cephalosporin use.     Prior to Admission medications   Medication Sig Start Date End Date Taking? Authorizing Provider  amLODipine (NORVASC) 10 MG tablet Take 10 mg by mouth at bedtime.    Yes [provider]  CINNAMON PO Take 1,000 mg by mouth 2 (two) times daily.    Yes [provider]  gabapentin (NEURONTIN) 300 MG capsule TAKE 1 CAPSULE(300 MG) BY MOUTH THREE TIMES DAILY 09/20/19  Yes [provider]  glimepiride (AMARYL) 4 MG tablet Take 4 mg by mouth 2 (two) times daily.    Yes [provider]  glucose blood (ONE TOUCH ULTRA TEST) test strip  07/24/15  Yes [provider]  ibuprofen (ADVIL,MOTRIN) 800 MG tablet Take 800 mg by mouth every 8 (eight) hours as needed.  Yes [provider]  Insulin Degludec (TRESIBA FLEXTOUCH) 200 UNIT/ML SOPN Inject 70 Units into the muscle every evening.  02/25/17  Yes [provider]  Insulin Pen Needle (B-D UF III MINI PEN NEEDLES) 31G X 5 MM MISC  08/01/15  Yes [provider]  JARDIANCE 25 MG TABS tablet Take 25 mg by mouth daily.  03/01/17  Yes [provider]  lisinopril-hydrochlorothiazide (PRINZIDE,ZESTORETIC) 20-25 MG tablet Take 1 tablet by mouth daily.   Yes [provider]  metFORMIN (GLUCOPHAGE) 1000 MG tablet Take 1,000 mg by mouth 2 (two) times daily with a meal.   Yes [provider]  Misc Natural Products (OSTEO BI-FLEX ADV DOUBLE ST PO) Take 1 tablet by mouth 2 (two) times daily.   Yes [provider]  Multiple Vitamins-Minerals (MULTIVITAMIN WITH MINERALS) tablet Take 1 tablet by mouth daily.   Yes [provider]  Omega-3 Fatty Acids (FISH OIL) 1000 MG CAPS Take 1,000 mg by mouth 2 (two) times daily.   Yes [provider]  omeprazole (PRILOSEC) 20 MG capsule Take 20 mg by mouth at bedtime.  12/17/15  Yes [provider]  ONETOUCH DELICA LANCETS [id number] MISC  08/01/15  Yes [provider]  pravastatin (PRAVACHOL) 40 MG tablet Take 40 mg by mouth at bedtime.    Yes [provider]  senna-docusate (SENOKOT-S) 8.6-50 MG tablet Take 2 tablets by mouth at bedtime. For AFTER surgery, hold if having loose stools 01/17/19  Yes Cross, Carollee Massed, NP  Misc. Devices MISC  11/17/13   [provider]    Social History   Socioeconomic History  . Marital status: Married    Spouse name: Not on file  . Number of children: 2  . Years of education: Not on file  . Highest education level: Not on file  Occupational History  . Occupation: Intake Coordinator  Tobacco Use  . Smoking status: Former Research scientist (life sciences)  . Smokeless tobacco: Never Used  . Tobacco comment: 18 years ago.  Substance and Sexual Activity  . Alcohol use: No    Alcohol/week: 0.0 standard drinks  . Drug use: No  . Sexual activity: Not on file  Other Topics Concern  . Not on file  Social History Narrative  . Not on file   Social Determinants of Health   Financial Resource Strain:   . Difficulty of Paying Living Expenses: Not on file  Food Insecurity:   . Worried About Charity fundraiser in the Last Year: Not on file  . Ran Out of Food in the Last Year: Not on file  Transportation Needs:   . Lack of Transportation (Medical): Not on file  . Lack of Transportation (Non-Medical): Not on file  Physical Activity:   . Days of Exercise per Week: Not on file  . Minutes of Exercise per Session: Not on file  Stress:   . Feeling of Stress : Not on file  Social Connections:   . Frequency of Communication with Friends and Family: Not on file  . Frequency of Social Gatherings with Friends and Family: Not on file  . Attends Religious Services: Not on file  . Active Member of Clubs or Organizations: Not on file  . Attends Archivist Meetings: Not on file  . Marital Status: Not on file  Intimate Partner Violence:   . Fear of Current or Ex-Partner: Not on file  . Emotionally Abused: Not on file  . Physically Abused: Not on file  . Sexually  Abused: Not on file    Observations/Objective: Vitals:   01/05/20 1708  Weight: (!) 392 lb 6.4 oz (178 kg)  Height: 5\' 9"  (1.753 m)  No distress on video, appropriate responses, all questions answered with understanding of plan expressed.   Assessment and Plan: Type 2  diabetes mellitus with hyperglycemia, with long-term current use of insulin (HCC) - Plan: metFORMIN (GLUCOPHAGE) 1000 MG tablet, JARDIANCE 25 MG TABS tablet, glimepiride (AMARYL) 4 MG tablet, Insulin Degludec (TRESIBA FLEXTOUCH) 200 UNIT/ML SOPN, Ambulatory referral to Endocrinology  -Uncontrolled by last A1c.  Variable readings at home.  Continue same regimen for now with home monitoring with fasting and 2-hour postprandials, follow-up video visit in 2 weeks, refer to endocrinology.  Diabetic polyneuropathy associated with type 2 diabetes mellitus ([place]) - Plan: gabapentin (NEURONTIN) 300 MG capsule RLS (restless legs syndrome) - Plan: gabapentin (NEURONTIN) 300 MG capsule  -By description likely has component of RLS and diabetic neuropathy.  Continue gabapentin same dose for now.  Essential hypertension - Plan: lisinopril-hydrochlorothiazide (ZESTORETIC) 20-25 MG tablet, amLODipine (NORVASC) 10 MG tablet  -Stable, continue same regimen.  Gastroesophageal reflux disease, unspecified whether esophagitis present - Plan: omeprazole (PRILOSEC) 20 MG capsule  -Controlled with omeprazole, continue same  Hyperlipidemia, unspecified hyperlipidemia type - Plan: pravastatin (PRAVACHOL) 40 MG tablet  -Stable control recent labs, continue pravastatin.  Polyarthralgia  -Borderline sed rate, uric acid.  Less likely inflammatory arthropathy but if continued polyarthralgias consider rheumatology eval.  Plan is to discuss with orthopedist.   Chronic pain of both knees - Plan: traMADol (ULTRAM) 50 MG tablet  -Treatment options previously, but could consider repeat injection.  Plan for orthopedic follow-up.  Agreed on tramadol prescription for now short-term.  Follow Up Instructions: Patient Instructions  Keep a record of your blood sugars outside of the office - fasting and 2 hour after meals - 2 readings per day. Recheck in 2 weeks by video.  I will also refer you to endocrinology for diabetes.   Call ortho  for follow up of knee pain and for evaluation of other areas as well. Inflammation test and gout test were only mildly elevated. Depending on visit with ortho, can refer you to rheumatology for an evaluation if needed.   No other medication changes for now.  Follow-up in 2 weeks as above.  Please let me know if there are questions in the meantime.   Return to the clinic or go to the nearest emergency room if any of your symptoms worsen or new symptoms occur.      I discussed the assessment and treatment plan with the patient. The patient was provided an opportunity to ask questions and all were answered. The patient agreed with the plan and demonstrated an understanding of the instructions.   The patient was advised to call back or seek an in-person evaluation if the symptoms worsen or if the condition fails to improve as anticipated.  I provided 30 minutes of non-face-to-face time during this encounter.   [name], MD

## 2020-01-05 NOTE — Patient Instructions (Addendum)
Keep a record of your blood sugars outside of the office - fasting and 2 hour after meals - 2 readings per day. Recheck in 2 weeks by video.  I will also refer you to endocrinology for diabetes.   Call ortho for follow up of knee pain and for evaluation of other areas as well. Inflammation test and gout test were only mildly elevated. Depending on visit with ortho, can refer you to rheumatology for an evaluation if needed.   No other medication changes for now.  Follow-up in 2 weeks as above.  Please let me know if there are questions in the meantime.   Return to the clinic or go to the nearest emergency room if any of your symptoms worsen or new symptoms occur.

## 2020-01-19 ENCOUNTER — Telehealth (INDEPENDENT_AMBULATORY_CARE_PROVIDER_SITE_OTHER): Payer: Managed Care, Other (non HMO) | Admitting: Family Medicine

## 2020-01-19 ENCOUNTER — Other Ambulatory Visit: Payer: Self-pay

## 2020-01-19 DIAGNOSIS — E1165 Type 2 diabetes mellitus with hyperglycemia: Secondary | ICD-10-CM | POA: Diagnosis not present

## 2020-01-19 DIAGNOSIS — Z794 Long term (current) use of insulin: Secondary | ICD-10-CM | POA: Diagnosis not present

## 2020-01-19 NOTE — Progress Notes (Signed)
Virtual Visit via Video Note  I connected with Leslie Mullen on 01/19/20 at 4:47 PM by a video enabled telemedicine application [employer] and verified that I am speaking with the correct person using two identifiers.   I discussed the limitations, risks, security and privacy concerns of performing an evaluation and management service by telephone and the availability of in person appointments. I also discussed with the patient that there may be a patient responsible charge related to this service. The patient expressed understanding and agreed to proceed, consent obtained  Chief complaint: Chief Complaint  Patient presents with  . Follow-up    on type 2 diabetes. pt states her BS is still elivated. pt states her BS was 236mg /dl fasting this morning. Pt states her medications is working well with no side effects      History of Present Illness: Leslie Mullen is a 59 y.o. female  Diabetes: Insulin-dependent, complicated by hyperglycemia, suspected polyneuropathy as well with restless leg syndrome, gabapentin use and chronic kidney disease.  New patient to me in January.  Uncontrolled diabetes with A1c of 13 in January.  At that time on Metformin 1000 mg twice daily, Jardiance 25 mg daily, Tresiba 80 units nightly, glimepiride 4 mg twice daily.  Some missed doses of medications at times discussed last visit.  Did improve her diet last visit on January 29, and had lost 6 pounds. She is on ACE inhibitor, statin. Referred to endocrinology. Message left today for her to CB to schedule with Dr. Kelton Pillar.   Missed morning dose of meds once this week.  Home readings since last visit Fasting: 302, 136, 238 2-hour postprandial:586 (no meds that morning), 286, 374.   No symptomatic lows.   Lab Results  Component Value Date   HGBA1C 13.0 (H) 12/22/2019     Patient Active Problem List   Diagnosis Date Noted  . Abnormal feces   . Benign neoplasm of ascending colon   . Benign neoplasm of cecum    . Benign neoplasm of transverse colon    Past Medical History:  Diagnosis Date  . Abnormal uterine bleeding   . Allergy   . Cataract    BOTH EYES  . Diabetes ([place])    type II   . Diabetic neuropathy ([place])   . GERD (gastroesophageal reflux disease)   . Hyperlipidemia   . Hypertension   . Morbid obesity ([place])   . Osteoarthritis    right knee    Past Surgical History:  Procedure Laterality Date  . CESAREAN SECTION     x3  . COLONOSCOPY WITH PROPOFOL N/A 06/29/2017   Procedure: COLONOSCOPY WITH PROPOFOL;  Surgeon: Irene Shipper, MD;  Location: [facility];  Service: Endoscopy;  Laterality: N/A;  . CYSTECTOMY Right 2010   Cyst removed from right groin.    Allergies  Allergen Reactions  . Penicillins Shortness Of Breath, Itching and Swelling    Has patient had a PCN reaction causing immediate rash, facial/tongue/throat swelling, SOB or lightheadedness with hypotension: Yes Has patient had a PCN reaction causing severe rash involving mucus membranes or skin necrosis:No Has patient had a PCN reaction that required hospitalization: No Has patient had a PCN reaction occurring within the last 10 years: no If all of the above answers are "NO", then may proceed with Cephalosporin use.     Prior to Admission medications   Medication Sig Start Date End Date Taking? Authorizing Provider  amLODipine (NORVASC) 10 MG tablet Take 1 tablet (10 mg total) by mouth  at bedtime. 01/05/20  Yes [name], MD  CINNAMON PO Take 1,000 mg by mouth 2 (two) times daily.    Yes [provider]  gabapentin (NEURONTIN) 300 MG capsule TAKE 1 CAPSULE(300 MG) BY MOUTH THREE TIMES DAILY 01/05/20  Yes [name], MD  glimepiride (AMARYL) 4 MG tablet Take 1 tablet (4 mg total) by mouth 2 (two) times daily. 01/05/20  Yes [name], MD  glucose blood (ONE TOUCH ULTRA TEST) test strip  07/24/15  Yes [provider]  ibuprofen (ADVIL,MOTRIN) 800 MG tablet Take 800 mg by mouth  every 8 (eight) hours as needed.   Yes [provider]  Insulin Degludec (TRESIBA FLEXTOUCH) 200 UNIT/ML SOPN Inject 80 Units into the muscle every evening. 01/05/20  Yes [name], MD  Insulin Pen Needle (B-D UF III MINI PEN NEEDLES) 31G X 5 MM MISC  08/01/15  Yes [provider]  JARDIANCE 25 MG TABS tablet Take 25 mg by mouth daily. 01/05/20  Yes [name], MD  lisinopril-hydrochlorothiazide (ZESTORETIC) 20-25 MG tablet Take 1 tablet by mouth daily. 01/05/20  Yes [name], MD  metFORMIN (GLUCOPHAGE) 1000 MG tablet Take 1 tablet (1,000 mg total) by mouth 2 (two) times daily with a meal. 01/05/20  Yes [name], MD  Misc Natural Products (OSTEO BI-FLEX ADV DOUBLE ST PO) Take 1 tablet by mouth 2 (two) times daily.   Yes [provider]  Misc. Devices [place]  11/17/13  Yes [provider]  Multiple Vitamins-Minerals (MULTIVITAMIN WITH MINERALS) tablet Take 1 tablet by mouth daily.   Yes [provider]  Omega-3 Fatty Acids (FISH OIL) 1000 MG CAPS Take 1,000 mg by mouth 2 (two) times daily.   Yes [provider]  omeprazole (PRILOSEC) 20 MG capsule Take 1 capsule (20 mg total) by mouth at bedtime. 01/05/20  Yes [name], MD  [place] LANCETS [id number] MISC  08/01/15  Yes [provider]  pravastatin (PRAVACHOL) 40 MG tablet Take 1 tablet (40 mg total) by mouth daily. 01/05/20  Yes [name], MD  senna-docusate (SENOKOT-S) 8.6-50 MG tablet Take 2 tablets by mouth at bedtime. For AFTER surgery, hold if having loose stools 01/17/19  Yes Cross, Carollee Massed, NP   Social History   Socioeconomic History  . Marital status: Married    Spouse name: Not on file  . Number of children: 2  . Years of education: Not on file  . Highest education level: Not on file  Occupational History  . Occupation: Intake Coordinator  Tobacco Use  . Smoking status: Former Research scientist (life sciences)  . Smokeless tobacco: Never Used  . Tobacco  comment: 18 years ago.  Substance and Sexual Activity  . Alcohol use: No    Alcohol/week: 0.0 standard drinks  . Drug use: No  . Sexual activity: Not on file  Other Topics Concern  . Not on file  Social History Narrative  . Not on file   Social Determinants of Health   Financial Resource Strain:   . Difficulty of Paying Living Expenses: Not on file  Food Insecurity:   . Worried About Charity fundraiser in the Last Year: Not on file  . Ran Out of Food in the Last Year: Not on file  Transportation Needs:   . Lack of Transportation (Medical): Not on file  . Lack of Transportation (Non-Medical): Not on file  Physical Activity:   . Days of Exercise per Week: Not  on file  . Minutes of Exercise per Session: Not on file  Stress:   . Feeling of Stress : Not on file  Social Connections:   . Frequency of Communication with Friends and Family: Not on file  . Frequency of Social Gatherings with Friends and Family: Not on file  . Attends Religious Services: Not on file  . Active Member of Clubs or Organizations: Not on file  . Attends Archivist Meetings: Not on file  . Marital Status: Not on file  Intimate Partner Violence:   . Fear of Current or Ex-Partner: Not on file  . Emotionally Abused: Not on file  . Physically Abused: Not on file  . Sexually Abused: Not on file    Observations/Objective: There were no vitals filed for this visit. BP 146/78, p 86.  No distress, appropriate responses. All questions answered.  Understanding of plan expressed.  Assessment and Plan: Type 2 diabetes mellitus with hyperglycemia, with long-term current use of insulin (HCC)  - still elevated readings -episode of marked elevation likely due to medication nonadherence that morning.  -Phone number provided for endocrinology to schedule visit.  For now will increase to the Tresiba by 2 units, then by another 2 units in the next 2 to 3 days if readings still above 200.  2-week virtual visit  follow-up.  RTC/ER precautions given.  Follow Up Instructions:   2 weeks virtual visit if not seen by endocrinology.  Patient Instructions  Increase Tresiba to 82 units for now, then in the next 3 days if your blood sugars remain over 200, increase to 84 units per day.  Watch for symptomatic hypoglycemia or low blood sugars with any increases in insulin.  No other changes in medications for now, make sure to take those as prescribed.  Call endocrinologist for appointment.  Let me know if you have any difficulty in scheduling that appointment.  Follow-up with me in 2 weeks to discuss diabetes, sooner if needed.  If you have seen endocrinologist in the next 2 weeks, you can cancel that appointment.   I discussed the assessment and treatment plan with the patient. The patient was provided an opportunity to ask questions and all were answered. The patient agreed with the plan and demonstrated an understanding of the instructions.   The patient was advised to call back or seek an in-person evaluation if the symptoms worsen or if the condition fails to improve as anticipated.  I provided 10 minutes of non-face-to-face time during this encounter.   [name], MD

## 2020-01-19 NOTE — Patient Instructions (Signed)
Increase Tresiba to 82 units for now, then in the next 3 days if your blood sugars remain over 200, increase to 84 units per day.  Watch for symptomatic hypoglycemia or low blood sugars with any increases in insulin.  No other changes in medications for now, make sure to take those as prescribed.  Call endocrinologist for appointment.  Let me know if you have any difficulty in scheduling that appointment.  Follow-up with me in 2 weeks to discuss diabetes, sooner if needed.  If you have seen endocrinologist in the next 2 weeks, you can cancel that appointment.

## 2020-01-28 ENCOUNTER — Other Ambulatory Visit: Payer: Self-pay | Admitting: Family Medicine

## 2020-01-28 DIAGNOSIS — M25562 Pain in left knee: Secondary | ICD-10-CM

## 2020-01-28 DIAGNOSIS — G8929 Other chronic pain: Secondary | ICD-10-CM

## 2020-01-30 ENCOUNTER — Other Ambulatory Visit: Payer: Self-pay

## 2020-01-30 ENCOUNTER — Telehealth: Payer: Self-pay | Admitting: Family Medicine

## 2020-01-30 NOTE — Telephone Encounter (Signed)
Patient is requesting a refill of the following medications: Requested Prescriptions   Pending Prescriptions Disp Refills  . traMADol (ULTRAM) 50 MG tablet [Pharmacy Med Name: TRAMADOL 50MG  TABLETS] 30 tablet     Sig: TAKE 1 TABLET(50 MG) BY MOUTH EVERY 6 HOURS FOR UP TO 5 DAYS AS NEEDED    Date of patient request: 01/28/20 Last office visit: 01/19/20 telemed Date of last refill: 01/05/20 with end date of 01/10/2020 Last refill amount: #30 Follow up time period per chart: f/u 2 weeks  Spoke with pt via phone an advised end date of 01/10/20 for tramadol as medication given for short term use and she will need to come  Into office for f/u for medication to be considered again by provider.  Pt advises she is having shoulder pain (both) and it is causing issues w/ bathing, doing hair and just raising her arms.  I advised an in office visit would be more appropriate so provider can physically see and exam the issue.  Pt agreeable.  Appt scheduled for 02/08/2020 at 11:00 am.

## 2020-01-30 NOTE — Telephone Encounter (Signed)
Pt was last seen by Dr. Carlota Raspberry on 12/22/19 and provider filled Tramadol prescription. Please advise if pt still needs appt.  LVM to pt to cb

## 2020-01-30 NOTE — Telephone Encounter (Signed)
Las filled 1/29. Controlled substance database (PDMP) reviewed. No concerns appreciated. Will refill, but what is plan with ortho? She was seen 2/8.  Thanks.

## 2020-01-31 ENCOUNTER — Ambulatory Visit: Payer: Managed Care, Other (non HMO) | Admitting: Internal Medicine

## 2020-01-31 ENCOUNTER — Encounter: Payer: Self-pay | Admitting: Internal Medicine

## 2020-01-31 VITALS — BP 138/74 | HR 99 | Temp 98.1°F | Ht 69.0 in | Wt 388.6 lb

## 2020-01-31 DIAGNOSIS — E11649 Type 2 diabetes mellitus with hypoglycemia without coma: Secondary | ICD-10-CM | POA: Insufficient documentation

## 2020-01-31 DIAGNOSIS — E1165 Type 2 diabetes mellitus with hyperglycemia: Secondary | ICD-10-CM | POA: Diagnosis not present

## 2020-01-31 DIAGNOSIS — E1122 Type 2 diabetes mellitus with diabetic chronic kidney disease: Secondary | ICD-10-CM | POA: Insufficient documentation

## 2020-01-31 DIAGNOSIS — E1121 Type 2 diabetes mellitus with diabetic nephropathy: Secondary | ICD-10-CM

## 2020-01-31 DIAGNOSIS — N1831 Chronic kidney disease, stage 3a: Secondary | ICD-10-CM

## 2020-01-31 DIAGNOSIS — E1142 Type 2 diabetes mellitus with diabetic polyneuropathy: Secondary | ICD-10-CM

## 2020-01-31 DIAGNOSIS — R739 Hyperglycemia, unspecified: Secondary | ICD-10-CM

## 2020-01-31 DIAGNOSIS — Z794 Long term (current) use of insulin: Secondary | ICD-10-CM

## 2020-01-31 LAB — GLUCOSE, POCT (MANUAL RESULT ENTRY): POC Glucose: 288 mg/dl — AB (ref 70–99)

## 2020-01-31 MED ORDER — TRESIBA FLEXTOUCH 200 UNIT/ML ~~LOC~~ SOPN: 70.0000 [IU] | PEN_INJECTOR | Freq: Every evening | SUBCUTANEOUS | 3 refills | Status: DC

## 2020-01-31 MED ORDER — OZEMPIC (0.25 OR 0.5 MG/DOSE) 2 MG/1.5ML ~~LOC~~ SOPN: 0.5000 mg | PEN_INJECTOR | SUBCUTANEOUS | 3 refills | Status: DC

## 2020-01-31 NOTE — Progress Notes (Signed)
Name: Leslie Mullen  MRN/ DOB: [id number], May 29, 1961   Age/ Sex: 59 y.o., female    PCP: [name], MD   Reason for Endocrinology Evaluation: Type 2 Diabetes Mellitus     Date of Initial Endocrinology Visit: 01/31/2020     PATIENT IDENTIFIER: Leslie Mullen is a 60 y.o. female with a past medical history of HTN, T2 DM, and dyslipidemia. The patient presented for initial endocrinology clinic visit on 01/31/2020 for consultative assistance with her diabetes management.    HPI: Leslie Mullen was    Diagnosed with DM in 2005 Prior Medications tried/Intolerance: Insulin in 2010. Janumet - nausea  Currently checking blood sugars 1 x / day,  before breakfast  Hypoglycemia episodes : no                Hemoglobin A1c has ranged from 9.5% in 2020, peaking at 13.0% in 2021. Patient required assistance for hypoglycemia: no  Patient has required hospitalization within the last 1 year from hyper or hypoglycemia: no   In terms of diet, the patient eats 2-3 meals a day, snacks rarely. Drinks sugar-sweetened   Beverages       HOME DIABETES REGIMEN: Glimepiride 4 mg BID   Jardiance 25 mg daily Metformin 1000 mg BID Tresiba 82-84 units per glucose reading  Statin: Yes ACE-I/ARB: Yes Prior Diabetic Education: yes   METER DOWNLOAD SUMMARY: Did not    DIABETIC COMPLICATIONS: Microvascular complications:  CKD III, neuropathy  Denies: retinopathy Last eye exam: Completed 10/18/2019  Macrovascular complications:   Denies: CAD, PVD, CVA   PAST HISTORY: Past Medical History:  Past Medical History:  Diagnosis Date  . Abnormal uterine bleeding   . Allergy   . Cataract    BOTH EYES  . Diabetes ([place])    type II   . Diabetic neuropathy ([place])   . GERD (gastroesophageal reflux disease)   . Hyperlipidemia   . Hypertension   . Morbid obesity ([place])   . Osteoarthritis    right knee    Past Surgical History:  Past Surgical History:  Procedure Laterality Date  . CESAREAN  SECTION     x3  . COLONOSCOPY WITH PROPOFOL N/A 06/29/2017   Procedure: COLONOSCOPY WITH PROPOFOL;  Surgeon: Irene Shipper, MD;  Location: [facility];  Service: Endoscopy;  Laterality: N/A;  . CYSTECTOMY Right 2010   Cyst removed from right groin.     Social History:  reports that she has quit smoking. She has never used smokeless tobacco. She reports that she does not drink alcohol or use drugs. Family History:  Family History  Problem Relation Age of Onset  . Diabetes Father   . Heart disease Father   . Colon cancer Neg Hx   . Esophageal cancer Neg Hx   . Rectal cancer Neg Hx   . Stomach cancer Neg Hx      HOME MEDICATIONS: Allergies as of 01/31/2020      Reactions   Penicillins Shortness Of Breath, Itching, Swelling   Has patient had a PCN reaction causing immediate rash, facial/tongue/throat swelling, SOB or lightheadedness with hypotension: Yes Has patient had a PCN reaction causing severe rash involving mucus membranes or skin necrosis:No Has patient had a PCN reaction that required hospitalization: No Has patient had a PCN reaction occurring within the last 10 years: no If all of the above answers are "NO", then may proceed with Cephalosporin use.        Medication List  Accurate as of January 31, 2020 11:03 AM. If you have any questions, ask your nurse or doctor.        amLODipine 10 MG tablet Commonly known as: NORVASC Take 1 tablet (10 mg total) by mouth at bedtime.   B-D UF III MINI PEN NEEDLES 31G X 5 MM Misc Generic drug: Insulin Pen Needle   BIOTIN PO Take by mouth.   CINNAMON PO Take 1,000 mg by mouth 2 (two) times daily.   Fish Oil 1000 MG Caps Take 1,000 mg by mouth 2 (two) times daily.   gabapentin 300 MG capsule Commonly known as: NEURONTIN TAKE 1 CAPSULE(300 MG) BY MOUTH THREE TIMES DAILY   glimepiride 4 MG tablet Commonly known as: AMARYL Take 1 tablet (4 mg total) by mouth 2 (two) times daily.   ibuprofen 800 MG  tablet Commonly known as: ADVIL Take 800 mg by mouth every 8 (eight) hours as needed.   Jardiance 25 MG Tabs tablet Generic drug: empagliflozin Take 25 mg by mouth daily.   lisinopril-hydrochlorothiazide 20-25 MG tablet Commonly known as: ZESTORETIC Take 1 tablet by mouth daily.   metFORMIN 1000 MG tablet Commonly known as: GLUCOPHAGE Take 1 tablet (1,000 mg total) by mouth 2 (two) times daily with a meal.   Misc. Devices Misc   multivitamin with minerals tablet Take 1 tablet by mouth daily.   omeprazole 20 MG capsule Commonly known as: PriLOSEC Take 1 capsule (20 mg total) by mouth at bedtime.   ONE TOUCH ULTRA TEST test strip Generic drug: glucose blood   OneTouch [place] Lancets [id number] Misc   OSTEO BI-FLEX ADV DOUBLE ST PO Take 1 tablet by mouth 2 (two) times daily.   pravastatin 40 MG tablet Commonly known as: PRAVACHOL Take 1 tablet (40 mg total) by mouth daily.   senna-docusate 8.6-50 MG tablet Commonly known as: Senokot-S Take 2 tablets by mouth at bedtime. For AFTER surgery, hold if having loose stools   traMADol 50 MG tablet Commonly known as: ULTRAM TAKE 1 TABLET(50 MG) BY MOUTH EVERY 6 HOURS FOR UP TO 5 DAYS AS NEEDED   Tresiba FlexTouch 200 UNIT/ML Sopn Generic drug: Insulin Degludec Inject 80 Units into the muscle every evening.        ALLERGIES: Allergies  Allergen Reactions  . Penicillins Shortness Of Breath, Itching and Swelling    Has patient had a PCN reaction causing immediate rash, facial/tongue/throat swelling, SOB or lightheadedness with hypotension: Yes Has patient had a PCN reaction causing severe rash involving mucus membranes or skin necrosis:No Has patient had a PCN reaction that required hospitalization: No Has patient had a PCN reaction occurring within the last 10 years: no If all of the above answers are "NO", then may proceed with Cephalosporin use.       REVIEW OF SYSTEMS: A comprehensive ROS was conducted with the  patient and is negative except as per HPI and below:  Review of Systems  Gastrointestinal: Positive for nausea. Negative for diarrhea.  Neurological: Positive for tingling.      OBJECTIVE:   VITAL SIGNS: BP 138/74 (BP Location: Left Arm, Patient Position: Sitting, Cuff Size: Large)   Pulse 99   Temp 98.1 F (36.7 C)   Ht 5\' 9"  (1.753 m)   Wt (!) 388 lb 9.6 oz (176.3 kg)   SpO2 97%   BMI 57.39 kg/m    PHYSICAL EXAM:  General: Pt appears well and is in NAD  Neck: General: Supple without adenopathy or carotid bruits. Thyroid: Thyroid  size normal.  No goiter or nodules appreciated. No thyroid bruit.  Lungs: Clear with good BS bilat with no rales, rhonchi, or wheezes  Heart: RRR with normal S1 and S2 and no gallops; no murmurs; no rub  Abdomen: Normoactive bowel sounds, soft, nontender, without masses or organomegaly palpable  Extremities:  Lower extremities - No pretibial edema. No lesions.  Skin: Normal texture and temperature to palpation. No rash noted. No Acanthosis nigricans/skin tags. No lipohypertrophy.  Neuro: MS is good with appropriate affect, pt is alert and Ox3    DM foot exam:    DATA REVIEWED:  Lab Results  Component Value Date   HGBA1C 13.0 (H) 12/22/2019   HGBA1C 9.5 (H) 01/23/2019   Lab Results  Component Value Date   LDLCALC 69 12/22/2019   CREATININE 1.37 (H) 12/22/2019   No results found for: [facility]  Lab Results  Component Value Date   CHOL 155 12/22/2019   HDL 47 12/22/2019   LDLCALC 69 12/22/2019   TRIG 238 (H) 12/22/2019   CHOLHDL 3.3 12/22/2019        ASSESSMENT / PLAN / RECOMMENDATIONS:   1) Type 2 Diabetes Mellitus, poorly controlled, With CKDIII and neuropathic complications - Most recent A1c of 13.0 %. Goal A1c < 7.0 %.    Plan: GENERAL: I have discussed with the patient the pathophysiology of diabetes. We went over the natural progression of the disease. We talked about both insulin resistance and insulin deficiency. We  stressed the importance of lifestyle changes including diet and exercise. I explained the complications associated with diabetes including retinopathy, nephropathy, neuropathy as well as increased risk of cardiovascular disease. We went over the benefit seen with glycemic control.   I explained to the patient that diabetic patients are at higher than normal risk for amputations.  Discussed pharmacokinetics of basal/bolus insulin and the importance of taking prandial insulin with meals.  We also discussed avoiding sugar-sweetened beverages and snacks, when possible.  We did discuss treatment options such as adding a prandial insulin, versus a GLP-1 agonist.  We did discuss the risk and benefits of both, patient would like to try GLP-1 agonist, we did discuss GI side effects We will stop glimepiride We also discussed that if her GFR drops< 45, will need to stop the Jardiance and reduce her Metformin dose.  MEDICATIONS:  STOP GLIMEPIRIDE - Continue Jardiance 25 mg daily  - Continue Metformin 1000 mg Twice daily  - Start Ozempic 0.25 mg weekly for 6 weeks, if no side effects ( vomiting or diarrhea) Then increase to 0.5 mg weekly  - Decrease tresiba to 70 units   EDUCATION / INSTRUCTIONS: BG monitoring instructions: Patient is instructed to check her blood sugars 2 times a day, fasting and bedtime. Call [facility] [facility] if: BG persistently < 70 or > 300. I reviewed the Rule of 15 for the treatment of hypoglycemia in detail with the patient. Literature supplied.   2) Diabetic complications:  Eye: Does not have known diabetic retinopathy.  Neuro/ Feet: Does have known diabetic peripheral neuropathy. Renal: Patient does have known baseline CKD. She is on an ACEI/ARB at present.   3) Lipids: Patient is on pravastatin.  LDL acceptable   4) Hypertension: She is at goal of < 140/90 mmHg.       Signed electronically by: Mack Guise, MD  [facility]  [facility] [address] [place], [place] [address] Phone: [phone] FAX: [phone]   CC: Carlota Raspberry  Ranell Patrick, MD [address] [place] [id number] Phone: [phone]  Fax: [phone]    Return to Endocrinology clinic as below: Future Appointments  Date Time Provider [place]  01/31/2020 11:10 AM Naja Apperson, Melanie Crazier, MD [facility] None  02/08/2020 11:00 AM [name], MD [facility]

## 2020-01-31 NOTE — Patient Instructions (Addendum)
-   STOP GLIMEPIRIDE - Continue Jardiance 25 mg daily  - Continue Metformin 1000 mg Twice daily  -  - Start Ozempic 0.25 mg weekly for 6 weeks, if no side effects ( vomiting or diarrhea) Then increase to 0.5 mg weekly   - Decrease tresiba to 70 units       - Contact us should your sugars remain consistently below 100 mg/dL    - Check sugar before Breakfast and Bedtime    HOW TO TREAT LOW BLOOD SUGARS (Blood sugar LESS THAN 70 MG/DL)  Please follow the RULE OF 15 for the treatment of hypoglycemia treatment (when your (blood sugars are less than 70 mg/dL)    STEP 1: Take 15 grams of carbohydrates when your blood sugar is low, which includes:   3-4 GLUCOSE TABS  OR  3-4 OZ OF JUICE OR REGULAR SODA OR  ONE TUBE OF GLUCOSE GEL     STEP 2: RECHECK blood sugar in 15 MINUTES STEP 3: If your blood sugar is still low at the 15 minute recheck --> then, go back to STEP 1 and treat AGAIN with another 15 grams of carbohydrates.

## 2020-02-08 ENCOUNTER — Ambulatory Visit: Payer: Managed Care, Other (non HMO) | Admitting: Family Medicine

## 2020-02-08 ENCOUNTER — Encounter: Payer: Self-pay | Admitting: Family Medicine

## 2020-02-08 ENCOUNTER — Ambulatory Visit
Admission: RE | Admit: 2020-02-08 | Discharge: 2020-02-08 | Disposition: A | Discharge status: Home / Self Care | Payer: Managed Care, Other (non HMO) | Source: Ambulatory Visit | Attending: Family Medicine | Admitting: Family Medicine

## 2020-02-08 ENCOUNTER — Other Ambulatory Visit: Payer: Self-pay

## 2020-02-08 VITALS — BP 150/90 | HR 94 | Temp 98.0°F | Ht 69.0 in | Wt 390.0 lb

## 2020-02-08 DIAGNOSIS — M25561 Pain in right knee: Secondary | ICD-10-CM | POA: Diagnosis not present

## 2020-02-08 DIAGNOSIS — M79645 Pain in left finger(s): Secondary | ICD-10-CM | POA: Diagnosis not present

## 2020-02-08 DIAGNOSIS — M2550 Pain in unspecified joint: Secondary | ICD-10-CM

## 2020-02-08 DIAGNOSIS — M25511 Pain in right shoulder: Secondary | ICD-10-CM | POA: Diagnosis not present

## 2020-02-08 DIAGNOSIS — Z794 Long term (current) use of insulin: Secondary | ICD-10-CM

## 2020-02-08 DIAGNOSIS — R700 Elevated erythrocyte sedimentation rate: Secondary | ICD-10-CM

## 2020-02-08 DIAGNOSIS — G8929 Other chronic pain: Secondary | ICD-10-CM

## 2020-02-08 DIAGNOSIS — M62838 Other muscle spasm: Secondary | ICD-10-CM

## 2020-02-08 DIAGNOSIS — M25512 Pain in left shoulder: Secondary | ICD-10-CM

## 2020-02-08 DIAGNOSIS — M542 Cervicalgia: Secondary | ICD-10-CM

## 2020-02-08 DIAGNOSIS — E1165 Type 2 diabetes mellitus with hyperglycemia: Secondary | ICD-10-CM | POA: Diagnosis not present

## 2020-02-08 DIAGNOSIS — M25562 Pain in left knee: Secondary | ICD-10-CM

## 2020-02-08 MED ORDER — CYCLOBENZAPRINE HCL 5 MG PO TABS: 5.0000 mg | ORAL_TABLET | Freq: Three times a day (TID) | ORAL | Status: DC | PRN

## 2020-02-08 MED ORDER — TRAMADOL HCL 50 MG PO TABS: ORAL_TABLET | ORAL | 0 refills | Status: DC

## 2020-02-08 NOTE — Patient Instructions (Addendum)
  Please have x-rays obtained today.  It is possible youmay have jammed your left finger but will make sure there are no broken bones.  I will refer you to orthopedics for the shoulder pain, but can try the muscle relaxant up to 3 times per day for right now as some of that pain may be related to your neck and muscle spasms. Be careful combining that medication with other sedating medicines such as the tramadol.  That was also ordered as a refill that can be picked up in 10 days.  Keep follow-up with orthopedics for knee pain.  I will check inflammation test today and if that is still elevated may need to have you evaluated by rheumatology for the different joint issues to see if other testing needed.  I am glad to hear things are going well with endocrinology, continue follow-up as planned.  Return to the clinic or go to the nearest emergency room if any of your symptoms worsen or new symptoms occur.   If you have lab work done today you will be contacted with your lab results within the next 2 weeks.  If you have not heard from [place] then please contact us. The fastest way to get your results is to register for My Chart.   IF you received an x-ray today, you will receive an invoice from [facility]. Please contact [facility] at [phone] with questions or concerns regarding your invoice.   IF you received labwork today, you will receive an invoice from [place]. Please contact [facility] at [phone] with questions or concerns regarding your invoice.   Our billing staff will not be able to assist you with questions regarding bills from these companies.  You will be contacted with the lab results as soon as they are available. The fastest way to get your results is to activate your My Chart account. Instructions are located on the last page of this paperwork. If you have not heard from [place] regarding the results in 2 weeks, please contact this office.

## 2020-02-08 NOTE — Progress Notes (Signed)
Subjective:  Patient ID: Leslie Mullen, female    DOB: Sep 13, 1961  Age: 59 y.o. MRN: [id number]  CC:  Chief Complaint  Patient presents with  . Shoulder Pain    pain started 3 months ago. pain has gontten progressivley worse since. pain is in both L and R shoulders radiates down to the elbows. pain is sharp and schey. pt feels pressure in her bisept whe she bends her arms. pt reports swelling in R arm. 7/10 pain level.   . Follow-up    on diabetes. pt hasn't had any issues controling her Diabetes since last visit. pt dose not check her BS dayil, but dose check it 3 times a week. pt states curren medication is working well with no known side effects.  Marland Kitchen knot on finger    pt has a knot on the inner side of her L pinkie finger. Right on the middle nuckel. knot is painful, pt reports its a sharp pain mainly when bent.Pain radiates down the finger into the wrist along the side of pts hand. pain level 6/10    HPI Janice Schiedel presents for follow-up as well as other concerns as above.   Shoulder pain: Past 3 months.  Both shoulders, R greater than left. No known injury. Top of shoulders and sore into neck area, shoulder joint and sharp pains into upper arms at times. No numbness.  Tx: ibuprofen 800mg  once per day. Min relief.  No prior shoulder issues or surgeries.   L finger knot: Noticed when taking pan out of oven 6 days ago. Had hard time bending it, noticed swelling on inside of the 5th finger knuckle. No known hx of gout, but uric acid mildly elevated prior. Same pain. Pain radiates into hand. May have jammed finger last week. Not sure.  R hand dominant.  Has had hands swell at times, stiff in the morning.    Diabetes with hyperglycemia: See previous visits, telemedicine visit February 12.  Tresiba increased to 82 units then up to 84 if needed.  Establish with dermatology every 24th, Dr.Shamleffer.  Glimepiride was discontinued, started Ozempic, decreased Tresiba to 70 units, continued  Metformin and Jardiance. Visit went well.   Chronic knee pain: Has been treated with tramadol - twice per day. See previous visits, discussed history of knee pain on January 15 visit.  Status post 2 rounds of Flexogenix in 2018 for viscosupplementation, topical Voltaren did not help.  I agreed to treat with tramadol for now until orthopedic evaluation and plan.  Mildly elevated sed rate of 54 on January 15, negative rheumatoid factor, uric acid level 7.5.  Did note some various arthralgias at January 15 visit..  Orthopedic eval February 8. Dr. Lyla Glassing. Plans on knee injections - visco - determining coverage with insurance - authorized recenty. Plans to call and make appt for injections soon.   Controlled substance database reviewed.  Last prescription for tramadol on February 23 for #30, previously January 29 for #30. Has been taking once per day to make meds last - increased pain on one per day, 2 per day controlled pain.    History Patient Active Problem List   Diagnosis Date Noted  . Type 2 diabetes mellitus with diabetic polyneuropathy, with long-term current use of insulin ([place]) 01/31/2020  . Type 2 diabetes mellitus with hyperglycemia, with long-term current use of insulin ([place]) 01/31/2020  . 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 Abnormal feces   .  Benign neoplasm of ascending colon   . Benign neoplasm of cecum   . Benign neoplasm of transverse colon    Past Medical History:  Diagnosis Date  . Abnormal uterine bleeding   . Allergy   . Cataract    BOTH EYES  . Diabetes ([place])    type II   . Diabetic neuropathy ([place])   . GERD (gastroesophageal reflux disease)   . Hyperlipidemia   . Hypertension   . Morbid obesity ([place])   . Osteoarthritis    right knee    Past Surgical History:  Procedure Laterality Date  . CESAREAN SECTION     x3  . COLONOSCOPY WITH PROPOFOL N/A 06/29/2017   Procedure:  COLONOSCOPY WITH PROPOFOL;  Surgeon: Irene Shipper, MD;  Location: [facility];  Service: Endoscopy;  Laterality: N/A;  . CYSTECTOMY Right 2010   Cyst removed from right groin.    Allergies  Allergen Reactions  . Penicillins Shortness Of Breath, Itching and Swelling    Has patient had a PCN reaction causing immediate rash, facial/tongue/throat swelling, SOB or lightheadedness with hypotension: Yes Has patient had a PCN reaction causing severe rash involving mucus membranes or skin necrosis:No Has patient had a PCN reaction that required hospitalization: No Has patient had a PCN reaction occurring within the last 10 years: no If all of the above answers are "NO", then may proceed with Cephalosporin use.     Prior to Admission medications   Medication Sig Start Date End Date Taking? Authorizing Provider  amLODipine (NORVASC) 10 MG tablet Take 1 tablet (10 mg total) by mouth at bedtime. 01/05/20   [name], MD  BIOTIN PO Take by mouth.    [provider]  CINNAMON PO Take 1,000 mg by mouth 2 (two) times daily.     [provider]  gabapentin (NEURONTIN) 300 MG capsule TAKE 1 CAPSULE(300 MG) BY MOUTH THREE TIMES DAILY 01/05/20   [name], MD  glucose blood (ONE TOUCH ULTRA TEST) test strip  07/24/15   [provider]  ibuprofen (ADVIL,MOTRIN) 800 MG tablet Take 800 mg by mouth every 8 (eight) hours as needed.    [provider]  Insulin Degludec (TRESIBA FLEXTOUCH) 200 UNIT/ML SOPN Inject 70 Units into the muscle every evening. 01/31/20   Shamleffer, Melanie Crazier, MD  Insulin Pen Needle (B-D UF III MINI PEN NEEDLES) 31G X 5 MM MISC  08/01/15   [provider]  JARDIANCE 25 MG TABS tablet Take 25 mg by mouth daily. 01/05/20   [name], MD  lisinopril-hydrochlorothiazide (ZESTORETIC) 20-25 MG tablet Take 1 tablet by mouth daily. 01/05/20   [name], MD  metFORMIN (GLUCOPHAGE) 1000 MG tablet Take 1 tablet (1,000 mg  total) by mouth 2 (two) times daily with a meal. 01/05/20   [name], MD  Misc Natural Products (OSTEO BI-FLEX ADV DOUBLE ST PO) Take 1 tablet by mouth 2 (two) times daily.    [provider]  Misc. Devices MISC  11/17/13   [provider]  Multiple Vitamins-Minerals (MULTIVITAMIN WITH MINERALS) tablet Take 1 tablet by mouth daily.    [provider]  Omega-3 Fatty Acids (FISH OIL) 1000 MG CAPS Take 1,000 mg by mouth 2 (two) times daily.    [provider]  omeprazole (PRILOSEC) 20 MG capsule Take 1 capsule (20 mg total) by mouth at bedtime. 01/05/20   [name], MD  [place] LANCETS [id number] MISC  08/01/15   [provider]  pravastatin (PRAVACHOL) 40 MG tablet Take 1 tablet (40 mg total) by mouth daily. 01/05/20   [name], MD  Semaglutide,0.25 or 0.5MG /DOS, (OZEMPIC, 0.25 OR 0.5 MG/DOSE,) 2 MG/1.5ML SOPN Inject 0.5 mg into the skin once a week. 01/31/20   Shamleffer, Melanie Crazier, MD  senna-docusate (SENOKOT-S) 8.6-50 MG tablet Take 2 tablets by mouth at bedtime. For AFTER surgery, hold if having loose stools 01/17/19   Cross, Lenna Sciara D, NP  traMADol (ULTRAM) 50 MG tablet TAKE 1 TABLET(50 MG) BY MOUTH EVERY 6 HOURS FOR UP TO 5 DAYS AS NEEDED 01/30/20   [name], MD   Social History   Socioeconomic History  . Marital status: Married    Spouse name: Not on file  . Number of children: 2  . Years of education: Not on file  . Highest education level: Not on file  Occupational History  . Occupation: Intake Coordinator  Tobacco Use  . Smoking status: Former Research scientist (life sciences)  . Smokeless tobacco: Never Used  . Tobacco comment: 18 years ago.  Substance and Sexual Activity  . Alcohol use: No    Alcohol/week: 0.0 standard drinks  . Drug use: No  . Sexual activity: Not on file  Other Topics Concern  . Not on file  Social History Narrative  . Not on file   Social Determinants of Health   Financial Resource Strain:   .  Difficulty of Paying Living Expenses: Not on file  Food Insecurity:   . Worried About Charity fundraiser in the Last Year: Not on file  . Ran Out of Food in the Last Year: Not on file  Transportation Needs:   . Lack of Transportation (Medical): Not on file  . Lack of Transportation (Non-Medical): Not on file  Physical Activity:   . Days of Exercise per Week: Not on file  . Minutes of Exercise per Session: Not on file  Stress:   . Feeling of Stress : Not on file  Social Connections:   . Frequency of Communication with Friends and Family: Not on file  . Frequency of Social Gatherings with Friends and Family: Not on file  . Attends Religious Services: Not on file  . Active Member of Clubs or Organizations: Not on file  . Attends Archivist Meetings: Not on file  . Marital Status: Not on file  Intimate Partner Violence:   . Fear of Current or Ex-Partner: Not on file  . Emotionally Abused: Not on file  . Physically Abused: Not on file  . Sexually Abused: Not on file    Review of Systems   Objective:   Vitals:   02/08/20 1204 02/08/20 1211  BP: (!) 165/97 (!) 150/90  Pulse: 94   Temp: 98 F (36.7 C)   TempSrc: Temporal   SpO2: 95%   Weight: (!) 390 lb (176.9 kg)   Height: 5\' 9"  (1.753 m)      Physical Exam Vitals reviewed.  Constitutional:      General: She is not in acute distress.    Appearance: She is well-developed. She is obese. She is not ill-appearing.  HENT:     Head: Normocephalic and atraumatic.  Cardiovascular:     Rate and Rhythm: Normal rate.  Pulmonary:     Effort: Pulmonary effort is normal.  Musculoskeletal:     Comments: L hand: 5th phalynx, ttp and prominent PIP. Able to flex and extend against resistance. nvi distally.   C spine: No midline bony  ttp. Decreased ROM, some pain into paraspinals. Spasm of R greater than L paraspinals and trapezius - upper. No ac/[place]/clavicle ttp.   Bilat shoulder: Decreased range of motion with 90  degrees of flexion, abduction, guarded due to pain.  Active and passive range of motion same.  Diffusely tender across dorsal and lateral shoulders and upper arms bilaterally. Reflexes 2+ at biceps, triceps, brachioradialis.  Neurological:     Mental Status: She is alert and oriented to person, place, and time.    DG Shoulder Right  Result Date: 02/08/2020 CLINICAL DATA:  Bilateral shoulder pain for 3 months without known injury. EXAM: RIGHT SHOULDER - 2+ VIEW COMPARISON:  None. FINDINGS: There is no evidence of fracture or dislocation. There is no evidence of arthropathy or other focal bone abnormality. Soft tissues are unremarkable. IMPRESSION: Negative. Electronically Signed   By: Marijo Conception M.D.   On: 02/08/2020 13:49   DG Shoulder Left  Result Date: 02/08/2020 CLINICAL DATA:  Bilateral shoulder pain for 3 months without known injury. EXAM: LEFT SHOULDER - 2+ VIEW COMPARISON:  None. FINDINGS: There is no evidence of fracture or dislocation. There is no evidence of arthropathy or other focal bone abnormality. Soft tissues are unremarkable. IMPRESSION: Negative. Electronically Signed   By: Marijo Conception M.D.   On: 02/08/2020 13:48   DG Finger Little Left  Result Date: 02/08/2020 CLINICAL DATA:  Acute left fifth finger pain without known injury. EXAM: LEFT LITTLE FINGER 2+V COMPARISON:  None. FINDINGS: There is no evidence of fracture or dislocation. There is no evidence of arthropathy or other focal bone abnormality. Soft tissues are unremarkable. IMPRESSION: Negative. Electronically Signed   By: Marijo Conception M.D.   On: 02/08/2020 13:47     Assessment & Plan:  Ziara Zaruba is a 59 y.o. female . Type 2 diabetes mellitus with hyperglycemia, with long-term current use of insulin (HCC)  -Improving, now under care of endocrinology, continue same.  Chronic pain of both knees - Plan: traMADol (ULTRAM) 50 MG tablet  -Agreed to refill tramadol until further treatment by orthopedics, plan is  to call and schedule injections.  No concerns on substance database review  Finger pain, left - Plan: DG Finger Little Left  Bilateral shoulder pain, unspecified chronicity - Plan: cyclobenzaprine (FLEXERIL) 5 MG tablet, DG Shoulder Left, DG Shoulder Right, Ambulatory referral to Orthopedic Surgery Neck pain - Plan: cyclobenzaprine (FLEXERIL) 5 MG tablet, DG Cervical Spine 2 or 3 views Muscle spasms of neck - Plan: cyclobenzaprine (FLEXERIL) 5 MG tablet  -Possible combination of shoulder pain with rotator cuff tendinosis plus or minus component of adhesive capsulitis with cervical source and spasm also contributing.  Trial of Flexeril, and referred to orthopedics for evaluation of ongoing shoulder issues.  Potential side effects of meds discussed.  -Possible jammed finger, x-rays reassuring.  Symptomatic care with RTC precautions  Elevated sed rate - Plan: Sedimentation Rate Polyarthralgia - Plan: Sedimentation Rate  -Repeat sed rate, and with reports of episodic swelling, if still elevated would consider rheumatology eval.   Meds ordered this encounter  Medications  . traMADol (ULTRAM) 50 MG tablet    Sig: Take 1 by mouth twice per day as needed.    Dispense:  60 tablet    Refill:  0    Ok to fill 02/18/20  . cyclobenzaprine (FLEXERIL) 5 MG tablet    Sig: Take 1 tablet (5 mg total) by mouth 3 (three) times daily as needed for muscle spasms (start qhs prn  due to sedation).    Dispense:  15 tablet    Refill:  o   Patient Instructions    Please have x-rays obtained today.  It is possible youmay have jammed your left finger but will make sure there are no broken bones.  I will refer you to orthopedics for the shoulder pain, but can try the muscle relaxant up to 3 times per day for right now as some of that pain may be related to your neck and muscle spasms. Be careful combining that medication with other sedating medicines such as the tramadol.  That was also ordered as a refill that can  be picked up in 10 days.  Keep follow-up with orthopedics for knee pain.  I will check inflammation test today and if that is still elevated may need to have you evaluated by rheumatology for the different joint issues to see if other testing needed.  I am glad to hear things are going well with endocrinology, continue follow-up as planned.  Return to the clinic or go to the nearest emergency room if any of your symptoms worsen or new symptoms occur.   If you have lab work done today you will be contacted with your lab results within the next 2 weeks.  If you have not heard from [place] then please contact us. The fastest way to get your results is to register for My Chart.   IF you received an x-ray today, you will receive an invoice from [facility]. Please contact [facility] at [phone] with questions or concerns regarding your invoice.   IF you received labwork today, you will receive an invoice from [place]. Please contact [facility] at [phone] with questions or concerns regarding your invoice.   Our billing staff will not be able to assist you with questions regarding bills from these companies.  You will be contacted with the lab results as soon as they are available. The fastest way to get your results is to activate your My Chart account. Instructions are located on the last page of this paperwork. If you have not heard from [place] regarding the results in 2 weeks, please contact this office.         Signed, Merri Ray, MD [facility]

## 2020-02-09 LAB — SEDIMENTATION RATE: Sed Rate: 79 mm/hr — ABNORMAL HIGH (ref 0–40)

## 2020-02-11 ENCOUNTER — Encounter: Payer: Self-pay | Admitting: Family Medicine

## 2020-03-01 ENCOUNTER — Encounter: Payer: Self-pay | Admitting: Family Medicine

## 2020-03-25 ENCOUNTER — Ambulatory Visit: Payer: Managed Care, Other (non HMO) | Admitting: Family Medicine

## 2020-04-02 ENCOUNTER — Other Ambulatory Visit: Payer: Self-pay | Admitting: Family Medicine

## 2020-04-02 DIAGNOSIS — E1142 Type 2 diabetes mellitus with diabetic polyneuropathy: Secondary | ICD-10-CM

## 2020-04-02 DIAGNOSIS — G2581 Restless legs syndrome: Secondary | ICD-10-CM

## 2020-04-02 NOTE — Telephone Encounter (Signed)
Requested Prescriptions  Pending Prescriptions Disp Refills  . gabapentin (NEURONTIN) 300 MG capsule [Pharmacy Med Name: GABAPENTIN 300MG  CAPSULES] 270 capsule 3    Sig: TAKE 1 CAPSULE(300 MG) BY MOUTH THREE TIMES DAILY     Neurology: Anticonvulsants - gabapentin Passed - 04/02/2020  3:47 AM      Passed - Valid encounter within last 12 months    Recent Outpatient Visits          1 month ago Type 2 diabetes mellitus with hyperglycemia, with long-term current use of insulin [facility])   [facility], Ranell Patrick, MD   2 months ago Type 2 diabetes mellitus with hyperglycemia, with long-term current use of insulin [facility])   [facility], Ranell Patrick, MD   2 months ago Type 2 diabetes mellitus with hyperglycemia, with long-term current use of insulin [facility])   [facility], Ranell Patrick, MD   3 months ago Type 2 diabetes mellitus without complication, with long-term current use of insulin [facility])   [facility], Ranell Patrick, MD

## 2020-04-15 ENCOUNTER — Ambulatory Visit: Payer: Managed Care, Other (non HMO) | Admitting: Family Medicine

## 2020-04-16 ENCOUNTER — Other Ambulatory Visit: Payer: Self-pay | Admitting: Family Medicine

## 2020-04-16 DIAGNOSIS — M25562 Pain in left knee: Secondary | ICD-10-CM

## 2020-04-16 DIAGNOSIS — M25561 Pain in right knee: Secondary | ICD-10-CM

## 2020-04-16 DIAGNOSIS — G8929 Other chronic pain: Secondary | ICD-10-CM

## 2020-04-16 NOTE — Telephone Encounter (Signed)
Controlled substance database (PDMP) reviewed. No concerns appreciated.  Prescription last filled in March.  Refill ordered.

## 2020-04-17 ENCOUNTER — Ambulatory Visit: Payer: Managed Care, Other (non HMO) | Admitting: Family Medicine

## 2020-04-22 ENCOUNTER — Encounter: Payer: Self-pay | Admitting: Family Medicine

## 2020-04-22 DIAGNOSIS — R700 Elevated erythrocyte sedimentation rate: Secondary | ICD-10-CM

## 2020-04-25 ENCOUNTER — Encounter: Payer: Self-pay | Admitting: Family Medicine

## 2020-05-01 ENCOUNTER — Ambulatory Visit: Payer: Managed Care, Other (non HMO) | Admitting: Internal Medicine

## 2020-05-31 ENCOUNTER — Other Ambulatory Visit: Payer: Self-pay | Admitting: Emergency Medicine

## 2020-05-31 ENCOUNTER — Other Ambulatory Visit: Payer: Self-pay | Admitting: Family Medicine

## 2020-05-31 ENCOUNTER — Encounter: Payer: Self-pay | Admitting: Family Medicine

## 2020-05-31 DIAGNOSIS — M25511 Pain in right shoulder: Secondary | ICD-10-CM

## 2020-05-31 DIAGNOSIS — M2550 Pain in unspecified joint: Secondary | ICD-10-CM

## 2020-05-31 DIAGNOSIS — M542 Cervicalgia: Secondary | ICD-10-CM

## 2020-05-31 DIAGNOSIS — M62838 Other muscle spasm: Secondary | ICD-10-CM

## 2020-05-31 DIAGNOSIS — M25512 Pain in left shoulder: Secondary | ICD-10-CM

## 2020-05-31 MED ORDER — IBUPROFEN 800 MG PO TABS: 800.0000 mg | ORAL_TABLET | Freq: Three times a day (TID) | ORAL | 0 refills | Status: DC | PRN

## 2020-05-31 NOTE — Telephone Encounter (Signed)
Requested medication (s) are due for refill today:  Yes  Requested medication (s) are on the active medication list:  yes  Future visit scheduled:  no  Last Refill: 02/08/20; #15; 0 refills  Notes to Clinic:  Medication is not delegated.   Requested Prescriptions  Pending Prescriptions Disp Refills   cyclobenzaprine (FLEXERIL) 5 MG tablet [Pharmacy Med Name: CYCLOBENZAPRINE 5MG  TABLETS] 15 tablet o    Sig: TAKE 1 TABLET BY MOUTH THREE TIMES DAILY AS NEEDED FOR MUSCLE SPASMS( START EVERY NIGHT AT BEDTIME AS NEEDED DUE TO SEDATION)      Not Delegated - Analgesics:  Muscle Relaxants Failed - 05/31/2020  1:55 PM      Failed - This refill cannot be delegated      Passed - Valid encounter within last 6 months    Recent Outpatient Visits           3 months ago Type 2 diabetes mellitus with hyperglycemia, with long-term current use of insulin ([place])   [facility], Ranell Patrick, MD   4 months ago Type 2 diabetes mellitus with hyperglycemia, with long-term current use of insulin [facility])   [facility], Ranell Patrick, MD   4 months ago Type 2 diabetes mellitus with hyperglycemia, with long-term current use of insulin [facility])   [facility], Ranell Patrick, MD   5 months ago Type 2 diabetes mellitus without complication, with long-term current use of insulin [facility])   [facility], Ranell Patrick, MD

## 2020-06-07 ENCOUNTER — Other Ambulatory Visit: Payer: Self-pay | Admitting: Family Medicine

## 2020-06-07 DIAGNOSIS — M2550 Pain in unspecified joint: Secondary | ICD-10-CM

## 2020-06-13 ENCOUNTER — Other Ambulatory Visit: Payer: Self-pay | Admitting: Family Medicine

## 2020-06-13 DIAGNOSIS — M25562 Pain in left knee: Secondary | ICD-10-CM

## 2020-06-13 DIAGNOSIS — G8929 Other chronic pain: Secondary | ICD-10-CM

## 2020-06-13 NOTE — Telephone Encounter (Signed)
Patient is requesting a refill of the following medications: Requested Prescriptions   Pending Prescriptions Disp Refills   traMADol (ULTRAM) 50 MG tablet [Pharmacy Med Name: TRAMADOL 50MG  TABLETS] 60 tablet     Sig: TAKE 1 TABLET BY MOUTH TWICE DAILY AS NEEDED    Date of patient request:  06/13/20 Last office visit: 02/08/20 Date of last refill: 04/16/20 Last refill amount: 60 Follow up time period per chart: 08/20/20

## 2020-06-13 NOTE — Telephone Encounter (Signed)
Controlled substance database (PDMP) reviewed. No concerns appreciated.  Last filled 04/16/2020.  Refill ordered.

## 2020-07-01 ENCOUNTER — Other Ambulatory Visit: Payer: Self-pay | Admitting: Family Medicine

## 2020-07-01 DIAGNOSIS — I10 Essential (primary) hypertension: Secondary | ICD-10-CM

## 2020-07-01 DIAGNOSIS — Z794 Long term (current) use of insulin: Secondary | ICD-10-CM

## 2020-07-01 DIAGNOSIS — E785 Hyperlipidemia, unspecified: Secondary | ICD-10-CM

## 2020-07-01 DIAGNOSIS — K219 Gastro-esophageal reflux disease without esophagitis: Secondary | ICD-10-CM

## 2020-07-05 ENCOUNTER — Other Ambulatory Visit: Payer: Self-pay | Admitting: Family Medicine

## 2020-07-05 DIAGNOSIS — Z794 Long term (current) use of insulin: Secondary | ICD-10-CM

## 2020-07-12 ENCOUNTER — Other Ambulatory Visit: Payer: Self-pay | Admitting: Family Medicine

## 2020-07-12 DIAGNOSIS — Z794 Long term (current) use of insulin: Secondary | ICD-10-CM

## 2020-07-23 ENCOUNTER — Other Ambulatory Visit: Payer: Self-pay | Admitting: Family Medicine

## 2020-07-23 ENCOUNTER — Telehealth: Payer: Self-pay | Admitting: Family Medicine

## 2020-07-23 ENCOUNTER — Other Ambulatory Visit: Payer: Self-pay | Admitting: Emergency Medicine

## 2020-07-23 DIAGNOSIS — G8929 Other chronic pain: Secondary | ICD-10-CM

## 2020-07-23 DIAGNOSIS — E785 Hyperlipidemia, unspecified: Secondary | ICD-10-CM

## 2020-07-23 DIAGNOSIS — E1165 Type 2 diabetes mellitus with hyperglycemia: Secondary | ICD-10-CM

## 2020-07-23 DIAGNOSIS — I10 Essential (primary) hypertension: Secondary | ICD-10-CM

## 2020-07-23 DIAGNOSIS — Z794 Long term (current) use of insulin: Secondary | ICD-10-CM

## 2020-07-23 MED ORDER — TRAMADOL HCL 50 MG PO TABS: 50.0000 mg | ORAL_TABLET | Freq: Two times a day (BID) | ORAL | 0 refills | Status: DC | PRN

## 2020-07-23 NOTE — Telephone Encounter (Signed)
Controlled substance database (PDMP) reviewed. No concerns appreciated.  Last filled tramadol 06/13/2020.  Refill placed.

## 2020-07-23 NOTE — Telephone Encounter (Signed)
traMADol (ULTRAM) 50 MG tablet [[id number]]    Pt is needing refill of medication listed above. Please advise.

## 2020-07-23 NOTE — Telephone Encounter (Signed)
Printed by accident please review, refill, and send to the pharmacy

## 2020-07-23 NOTE — Telephone Encounter (Signed)
DR Carlota Raspberry I was trying to refill these meds and refill the Tramadol by accident. It printed here in the office but I wrote void on it and trashed it. If approve can you please resend this medication through the computer. Thanks, Leslie Mullen

## 2020-07-23 NOTE — Telephone Encounter (Signed)
Requested medications are due for refill today?  Yes  Requested medications are on active medication list?  Yes  Last Refill:  Lisinopril-HCTZ  07/01/2020 # 90 with no refills  - This refill should last patient through 09/30/2020 Metformin 07/01/2020  # 180 with no refills   - This refill should last patient through 09/30/2020.   Tramadol 06/13/2020  # 60 with no refills.    This refill cannot be delegated.    Future visit scheduled?  No -Patient was last seen 5 months ago.  Patient is due for office visit this month per protocol.    Notes to Clinic:  Lisinopril-HCTZ failed Rx refill protocol due to no labs within the past 180 days.  Last labs were in January 2021. Metformin RX refill protocol due to no HGb A1C in past 180 days.   Tramadol refill cannot be delegated.

## 2020-07-23 NOTE — Telephone Encounter (Signed)
Requested Prescriptions  Pending Prescriptions Disp Refills   pravastatin (PRAVACHOL) 40 MG tablet [Pharmacy Med Name: PRAVASTATIN 40MG TABLETS] 90 tablet 1    Sig: TAKE 1 TABLET(40 MG) BY MOUTH DAILY     Cardiovascular:  Antilipid - Statins Failed - 07/23/2020  2:17 AM      Failed - LDL in normal range and within 360 days    LDL Chol Calc (NIH)  Date Value Ref Range Status  12/22/2019 69 0 - 99 mg/dL Final         Failed - Triglycerides in normal range and within 360 days    Triglycerides  Date Value Ref Range Status  12/22/2019 238 (H) 0 - 149 mg/dL Final         Passed - Total Cholesterol in normal range and within 360 days    Cholesterol, Total  Date Value Ref Range Status  12/22/2019 155 100 - 199 mg/dL Final         Passed - HDL in normal range and within 360 days    HDL  Date Value Ref Range Status  12/22/2019 47 >39 mg/dL Final         Passed - Patient is not pregnant      Passed - Valid encounter within last 12 months    Recent Outpatient Visits          5 months ago Type 2 diabetes mellitus with hyperglycemia, with long-term current use of insulin ([place])   [facility], Ranell Patrick, MD   6 months ago Type 2 diabetes mellitus with hyperglycemia, with long-term current use of insulin ([place])   [facility], Ranell Patrick, MD   6 months ago Type 2 diabetes mellitus with hyperglycemia, with long-term current use of insulin [facility])   [facility], Ranell Patrick, MD   7 months ago Type 2 diabetes mellitus without complication, with long-term current use of insulin ([place])   [facility], Ranell Patrick, MD              lisinopril-hydrochlorothiazide (ZESTORETIC) 20-25 MG tablet [Pharmacy Med Name: LISINOPRIL-HCTZ 20/25MG TABLETS] 90 tablet 0    Sig: TAKE 1 TABLET BY MOUTH DAILY     Cardiovascular:  ACEI + Diuretic Combos Failed - 07/23/2020  2:17 AM      Failed - Na in normal range and within 180 days    Sodium   Date Value Ref Range Status  12/22/2019 137 134 - 144 mmol/L Final         Failed - K in normal range and within 180 days    Potassium  Date Value Ref Range Status  12/22/2019 4.8 3.5 - 5.2 mmol/L Final         Failed - Cr in normal range and within 180 days    Creatinine, Ser  Date Value Ref Range Status  12/22/2019 1.37 (H) 0.57 - 1.00 mg/dL Final         Failed - Ca in normal range and within 180 days    Calcium  Date Value Ref Range Status  12/22/2019 9.8 8.7 - 10.2 mg/dL Final         Failed - Last BP in normal range    BP Readings from Last 1 Encounters:  02/08/20 (!) 150/90         Passed - Patient is not pregnant      Passed - Valid encounter within last 6 months  Recent Outpatient Visits          5 months ago Type 2 diabetes mellitus with hyperglycemia, with long-term current use of insulin ([place])   [facility], Ranell Patrick, MD   6 months ago Type 2 diabetes mellitus with hyperglycemia, with long-term current use of insulin [facility])   [facility], Ranell Patrick, MD   6 months ago Type 2 diabetes mellitus with hyperglycemia, with long-term current use of insulin [facility])   [facility], Ranell Patrick, MD   7 months ago Type 2 diabetes mellitus without complication, with long-term current use of insulin ([place])   [facility], Ranell Patrick, MD              metFORMIN (GLUCOPHAGE) 1000 MG tablet [Pharmacy Med Name: METFORMIN 1000MG TABLETS] 180 tablet 0    Sig: TAKE 1 TABLET(1000 MG) BY MOUTH TWICE DAILY WITH A MEAL     Endocrinology:  Diabetes - Biguanides Failed - 07/23/2020  2:17 AM      Failed - Cr in normal range and within 360 days    Creatinine, Ser  Date Value Ref Range Status  12/22/2019 1.37 (H) 0.57 - 1.00 mg/dL Final         Failed - HBA1C is between 0 and 7.9 and within 180 days    Hgb A1c MFr Bld  Date Value Ref Range Status  12/22/2019 13.0 (H) 4.8 - 5.6 % Final    Comment:              Prediabetes: 5.7 - 6.4          Diabetes: >6.4          Glycemic control for adults with diabetes: <7.0          Failed - eGFR in normal range and within 360 days    GFR calc Af Amer  Date Value Ref Range Status  12/22/2019 49 (L) >59 mL/min/1.73 Final   GFR calc non Af Amer  Date Value Ref Range Status  12/22/2019 43 (L) >59 mL/min/1.73 Final         Passed - Valid encounter within last 6 months    Recent Outpatient Visits          5 months ago Type 2 diabetes mellitus with hyperglycemia, with long-term current use of insulin ([place])   [facility], Ranell Patrick, MD   6 months ago Type 2 diabetes mellitus with hyperglycemia, with long-term current use of insulin [facility])   [facility], Ranell Patrick, MD   6 months ago Type 2 diabetes mellitus with hyperglycemia, with long-term current use of insulin [facility])   [facility], Ranell Patrick, MD   7 months ago Type 2 diabetes mellitus without complication, with long-term current use of insulin [facility])   [facility], Ranell Patrick, MD              traMADol (ULTRAM) 50 MG tablet [Pharmacy Med Name: TRAMADOL 50MG TABLETS] 60 tablet     Sig: TAKE 1 TABLET BY MOUTH TWICE DAILY AS NEEDED     Not Delegated - Analgesics:  Opioid Agonists Failed - 07/23/2020  2:17 AM      Failed - This refill cannot be delegated      Failed - Urine Drug Screen completed in last 360 days.      Passed - Valid encounter within last 6  months    Recent Outpatient Visits          5 months ago Type 2 diabetes mellitus with hyperglycemia, with long-term current use of insulin [facility])   [facility], Ranell Patrick, MD   6 months ago Type 2 diabetes mellitus with hyperglycemia, with long-term current use of insulin [facility])   [facility], Ranell Patrick, MD   6 months ago Type 2 diabetes mellitus with hyperglycemia, with long-term current use of insulin [facility])   [facility], Ranell Patrick, MD   7 months ago Type 2 diabetes mellitus without complication, with long-term current use of insulin [facility])   [facility], Ranell Patrick, MD

## 2020-07-23 NOTE — Addendum Note (Signed)
Addended by: Merri Ray R on: 07/23/2020 12:28 PM   Modules accepted: Orders

## 2020-07-25 ENCOUNTER — Other Ambulatory Visit: Payer: Self-pay | Admitting: Internal Medicine

## 2020-08-06 NOTE — Progress Notes (Deleted)
Office Visit Note  Patient: Leslie Mullen             Date of Birth: 1961/03/15           MRN: [id number]             PCP: [name], MD Referring: [name], MD Visit Date: 08/20/2020 Occupation: @GUAROCC @  Subjective:  No chief complaint on file.   History of Present Illness: Leslie Mullen is a 59 y.o. female ***   Activities of Daily Living:  Patient reports morning stiffness for *** {minute/hour:19697}.   Patient {ACTIONS;DENIES/REPORTS:21021675::"Denies"} nocturnal pain.  Difficulty dressing/grooming: {ACTIONS;DENIES/REPORTS:21021675::"Denies"} Difficulty climbing stairs: {ACTIONS;DENIES/REPORTS:21021675::"Denies"} Difficulty getting out of chair: {ACTIONS;DENIES/REPORTS:21021675::"Denies"} Difficulty using hands for taps, buttons, cutlery, and/or writing: {ACTIONS;DENIES/REPORTS:21021675::"Denies"}  No Rheumatology ROS completed.   PMFS History:  Patient Active Problem List   Diagnosis Date Noted  . Type 2 diabetes mellitus with diabetic polyneuropathy, with long-term current use of insulin ([place]) 01/31/2020  . Type 2 diabetes mellitus with hyperglycemia, with long-term current use of insulin ([place]) 01/31/2020  . Type 2 diabetes mellitus with stage 3a chronic kidney disease, with long-term current use of insulin ([place]) 01/31/2020  . Abnormal feces   . Benign neoplasm of ascending colon   . Benign neoplasm of cecum   . Benign neoplasm of transverse colon     Past Medical History:  Diagnosis Date  . Abnormal uterine bleeding   . Allergy   . Cataract    BOTH EYES  . Diabetes ([place])    type II   . Diabetic neuropathy ([place])   . GERD (gastroesophageal reflux disease)   . Hyperlipidemia   . Hypertension   . Morbid obesity ([place])   . Osteoarthritis    right knee     Family History  Problem Relation Age of Onset  . Diabetes Father   . Heart disease Father   . Colon cancer Neg Hx   . Esophageal cancer Neg Hx   . Rectal cancer Neg Hx   . Stomach  cancer Neg Hx    Past Surgical History:  Procedure Laterality Date  . CESAREAN SECTION     x3  . COLONOSCOPY WITH PROPOFOL N/A 06/29/2017   Procedure: COLONOSCOPY WITH PROPOFOL;  Surgeon: Irene Shipper, MD;  Location: [facility];  Service: Endoscopy;  Laterality: N/A;  . CYSTECTOMY Right 2010   Cyst removed from right groin.    Social History   Social History Narrative  . Not on file   Immunization History  Administered Date(s) Administered  . Influenza,inj,Quad PF,6+ Mos 12/22/2019     Objective: Vital Signs: There were no vitals taken for this visit.   Physical Exam   Musculoskeletal Exam: ***  CDAI Exam: CDAI Score: -- Patient Global: --; Provider Global: -- Swollen: --; Tender: -- Joint Exam 08/20/2020   No joint exam has been documented for this visit   There is currently no information documented on the homunculus. Go to the Rheumatology activity and complete the homunculus joint exam.  Investigation: No additional findings.  Imaging: No results found.  Recent Labs: Lab Results  Component Value Date   WBC 11.2 (H) 01/23/2019   HGB 12.1 01/23/2019   PLT 270 01/23/2019   NA 137 12/22/2019   K 4.8 12/22/2019   CL 99 12/22/2019   CO2 25 12/22/2019   GLUCOSE 254 (H) 12/22/2019   BUN 25 (H) 12/22/2019   CREATININE 1.37 (H) 12/22/2019   BILITOT <0.2 12/22/2019   ALKPHOS 90 12/22/2019  AST 19 12/22/2019   ALT 17 12/22/2019   PROT 7.0 12/22/2019   ALBUMIN 3.6 (L) 12/22/2019   CALCIUM 9.8 12/22/2019   GFRAA 49 (L) 12/22/2019    Speciality Comments: No specialty comments available.  Procedures:  No procedures performed Allergies: Penicillins   Assessment / Plan:     Visit Diagnoses: No diagnosis found.  Orders: No orders of the defined types were placed in this encounter.  No orders of the defined types were placed in this encounter.   Face-to-face time spent with patient was *** minutes. Greater than 50% of time was spent in counseling  and coordination of care.  Follow-Up Instructions: No follow-ups on file.   Ofilia Neas, PA-C  Note - This record has been created using Dragon software.  Chart creation errors have been sought, but may not always  have been located. Such creation errors do not reflect on  the standard of medical care.

## 2020-08-20 ENCOUNTER — Ambulatory Visit: Payer: Managed Care, Other (non HMO) | Admitting: Rheumatology

## 2020-09-10 ENCOUNTER — Telehealth: Payer: Self-pay | Admitting: Family Medicine

## 2020-09-10 DIAGNOSIS — G8929 Other chronic pain: Secondary | ICD-10-CM

## 2020-09-10 DIAGNOSIS — M25562 Pain in left knee: Secondary | ICD-10-CM

## 2020-09-10 NOTE — Telephone Encounter (Signed)
Patient is requesting a refill of the following medications: Requested Prescriptions   Pending Prescriptions Disp Refills  . traMADol (ULTRAM) 50 MG tablet [Pharmacy Med Name: TRAMADOL 50MG  TABLETS] 60 tablet     Sig: TAKE 1 TABLET(50 MG) BY MOUTH TWICE DAILY AS NEEDED    Date of patient request: 09/10/2020 Last office visit: 02/08/2020 Date of last refill: 07/23/2020 Last refill amount: 60 tablets  Follow up time period per chart: N/A

## 2020-09-10 NOTE — Telephone Encounter (Signed)
Requested medication (s) are due for refill today - unsure  Requested medication (s) are on the active medication list -yes  Future visit scheduled -no  Last refill: 07/23/20  Notes to clinic: Request for non delegated Rx  Requested Prescriptions  Pending Prescriptions Disp Refills   traMADol (ULTRAM) 50 MG tablet [Pharmacy Med Name: TRAMADOL 50MG  TABLETS] 60 tablet     Sig: TAKE 1 TABLET(50 MG) BY MOUTH TWICE DAILY AS NEEDED      Not Delegated - Analgesics:  Opioid Agonists Failed - 09/10/2020  1:34 PM      Failed - This refill cannot be delegated      Failed - Urine Drug Screen completed in last 360 days.      Failed - Valid encounter within last 6 months    Recent Outpatient Visits           7 months ago Type 2 diabetes mellitus with hyperglycemia, with long-term current use of insulin [facility])   [facility], Ranell Patrick, MD   7 months ago Type 2 diabetes mellitus with hyperglycemia, with long-term current use of insulin [facility])   [facility], Ranell Patrick, MD   8 months ago Type 2 diabetes mellitus with hyperglycemia, with long-term current use of insulin [facility])   [facility], Ranell Patrick, MD   8 months ago Type 2 diabetes mellitus without complication, with long-term current use of insulin [facility])   [facility], Ranell Patrick, MD                  Requested Prescriptions  Pending Prescriptions Disp Refills   traMADol (ULTRAM) 50 MG tablet [Pharmacy Med Name: TRAMADOL 50MG  TABLETS] 60 tablet     Sig: TAKE 1 TABLET(50 MG) BY MOUTH TWICE DAILY AS NEEDED      Not Delegated - Analgesics:  Opioid Agonists Failed - 09/10/2020  1:34 PM      Failed - This refill cannot be delegated      Failed - Urine Drug Screen completed in last 360 days.      Failed - Valid encounter within last 6 months    Recent Outpatient Visits           7 months ago Type 2 diabetes mellitus with hyperglycemia, with long-term current use of  insulin [facility])   [facility], Ranell Patrick, MD   7 months ago Type 2 diabetes mellitus with hyperglycemia, with long-term current use of insulin [facility])   [facility], Ranell Patrick, MD   8 months ago Type 2 diabetes mellitus with hyperglycemia, with long-term current use of insulin [facility])   [facility], Ranell Patrick, MD   8 months ago Type 2 diabetes mellitus without complication, with long-term current use of insulin [facility])   [facility], Ranell Patrick, MD

## 2020-09-11 ENCOUNTER — Other Ambulatory Visit: Payer: Self-pay | Admitting: Obstetrics and Gynecology

## 2020-09-11 NOTE — Telephone Encounter (Signed)
Controlled substance database (PDMP) reviewed. No concerns appreciated.  Last filled for #60 on 07/23/2020.  Chronic knee pain discussed in March.  Refill ordered, please schedule for follow-up office visit in the next 6 weeks.

## 2020-09-11 NOTE — Telephone Encounter (Signed)
Called pt LVM for her to call our office to schedule an appt in the nest 6 weeks

## 2020-09-12 ENCOUNTER — Other Ambulatory Visit: Payer: Self-pay | Admitting: Internal Medicine

## 2020-09-12 NOTE — Telephone Encounter (Signed)
Please call again to schedule appt.

## 2020-09-12 NOTE — Telephone Encounter (Signed)
Called and left VM detailing need for appt.

## 2020-09-17 ENCOUNTER — Telehealth: Payer: Self-pay | Admitting: *Deleted

## 2020-09-17 NOTE — Telephone Encounter (Signed)
Called the patient and left a message to call the office back. Patient needs to be scheduled for a new patient appt.  

## 2020-09-18 ENCOUNTER — Telehealth: Payer: Self-pay | Admitting: *Deleted

## 2020-09-18 NOTE — Telephone Encounter (Signed)
Called the patient and scheduled her for a new patient appt on 10/18 at 9 am with Dr Denman George. Patient given address and phone number for the clinic. Also gave the policy for mask and visitors

## 2020-09-23 ENCOUNTER — Inpatient Hospital Stay (HOSPITAL_BASED_OUTPATIENT_CLINIC_OR_DEPARTMENT_OTHER): Payer: Managed Care, Other (non HMO) | Admitting: Gynecologic Oncology

## 2020-09-23 ENCOUNTER — Other Ambulatory Visit: Payer: Self-pay | Admitting: Gynecologic Oncology

## 2020-09-23 ENCOUNTER — Other Ambulatory Visit: Payer: Self-pay

## 2020-09-23 ENCOUNTER — Encounter: Payer: Self-pay | Admitting: Gynecologic Oncology

## 2020-09-23 ENCOUNTER — Inpatient Hospital Stay: Payer: Managed Care, Other (non HMO) | Attending: Gynecologic Oncology

## 2020-09-23 VITALS — BP 144/73 | HR 96 | Temp 96.8°F | Resp 18 | Ht 68.0 in | Wt 364.4 lb

## 2020-09-23 DIAGNOSIS — N8501 Benign endometrial hyperplasia: Secondary | ICD-10-CM | POA: Diagnosis not present

## 2020-09-23 DIAGNOSIS — Z800 Family history of malignant neoplasm of digestive organs: Secondary | ICD-10-CM | POA: Diagnosis not present

## 2020-09-23 DIAGNOSIS — Z79899 Other long term (current) drug therapy: Secondary | ICD-10-CM | POA: Diagnosis not present

## 2020-09-23 DIAGNOSIS — Z794 Long term (current) use of insulin: Secondary | ICD-10-CM | POA: Diagnosis not present

## 2020-09-23 DIAGNOSIS — E1165 Type 2 diabetes mellitus with hyperglycemia: Secondary | ICD-10-CM

## 2020-09-23 DIAGNOSIS — Z87891 Personal history of nicotine dependence: Secondary | ICD-10-CM | POA: Insufficient documentation

## 2020-09-23 DIAGNOSIS — I10 Essential (primary) hypertension: Secondary | ICD-10-CM | POA: Insufficient documentation

## 2020-09-23 DIAGNOSIS — E119 Type 2 diabetes mellitus without complications: Secondary | ICD-10-CM | POA: Insufficient documentation

## 2020-09-23 DIAGNOSIS — K219 Gastro-esophageal reflux disease without esophagitis: Secondary | ICD-10-CM | POA: Insufficient documentation

## 2020-09-23 DIAGNOSIS — C541 Malignant neoplasm of endometrium: Secondary | ICD-10-CM | POA: Insufficient documentation

## 2020-09-23 DIAGNOSIS — E785 Hyperlipidemia, unspecified: Secondary | ICD-10-CM | POA: Diagnosis not present

## 2020-09-23 DIAGNOSIS — M2550 Pain in unspecified joint: Secondary | ICD-10-CM

## 2020-09-23 LAB — HEMOGLOBIN A1C
Hgb A1c MFr Bld: 11.5 % — ABNORMAL HIGH (ref 4.8–5.6)
Mean Plasma Glucose: 283 mg/dL

## 2020-09-23 MED ORDER — SENNOSIDES-DOCUSATE SODIUM 8.6-50 MG PO TABS: 2.0000 | ORAL_TABLET | Freq: Every day | ORAL | 1 refills | Status: DC

## 2020-09-23 MED ORDER — TRAMADOL HCL 50 MG PO TABS: 50.0000 mg | ORAL_TABLET | Freq: Four times a day (QID) | ORAL | 0 refills | Status: DC | PRN

## 2020-09-23 MED ORDER — IBUPROFEN 800 MG PO TABS: 800.0000 mg | ORAL_TABLET | Freq: Three times a day (TID) | ORAL | 0 refills | Status: DC | PRN

## 2020-09-23 NOTE — Progress Notes (Signed)
Consult Note: Gyn-Onc  Consult was requested by Dr. Charlesetta Garibaldi for the evaluation of Leslie Mullen 59 y.o. female  CC:  Chief Complaint  Patient presents with  . Endometrial cancer    Assessment/Plan:  Leslie Mullen  is a 59 y.o.  year old P3 with grade 1 endometrial cancer.  A detailed discussion was held with the patient with regard to to her endometrial cancer diagnosis. We discussed the standard management options for uterine cancer which includes either surgery followed possibly by adjuvant therapy depending on the results of surgery, or medical therapy with D&C and progestin releasing IUD.   The surgical management include a robotic assisted total hysterectomy and removal of the tubes and ovaries with sampling of lymph nodes. If a minimally invasive approach is not feasible, a laparotomy may be necessary (including for specimen delivery). The patient has been counseled about these surgical options and the risks of surgery in general including infection, bleeding, damage to surrounding structures (including bowel, bladder, ureters, nerves or vessels), and the postoperative risks of PE/ DVT, and lymphedema.  I explained to her that she is at increased risk of these complications given her morbid obesity.  I further explained that it may not be technically feasible to complete the surgical procedure if visualization of her uterus is prevented from her abdominal obesity or inability to tolerate Trendelenburg positioning.  However she does carry most of her weight in her lower abdominal pannus which can be taped intraoperatively which may allow for decrease pressure on the chest and improved visceral visualization.  I explained to the patient that only at the time of surgery we know if it is safe to proceed with hysterectomy.    An alternative to hysterectomy would be D&C and Mirena IUD placement.  This would be necessary if her hemoglobin A1c today is greater than 8%.  Alternatively if we attempt  hysterectomy but she does not tolerate positioning we were unable to see the uterus intraoperatively for safe removal.  If that is the case we will abort hysterectomy and proceed with D&C and Mirena IUD placement.  If she has this performed I explained the 60 to 80% chance of progression of the cancer.  I explained that she will need repeat sampling in the office at 3 months to confirm regression.  I feel that this could be easily accomplished in the office given the easy visualization of her cervix on speculum exam.  After counseling and consideration of her options, she is in agreement to proceed with robotic assisted total hysterectomy with bilateral sapingo-oophorectomy and SLN biopsy with possible D&C IUD if hysterectomy is not feasible tolerated by the patient.   She will be seen by anesthesia for preoperative clearance and discussion of postoperative pain management.  She was given the opportunity to ask questions, which were answered to her satisfaction, and she is agreement with the above mentioned plan of care.   We explained that robotic hysterectomy is typically an outpatient procedure with same day discharge provided that she is meeting appropriate discharge criteria from the PACU. We provided extensive counseling regarding post-operative expectations for recovery and restrictions/limitations. We provided information regarding multi-modal pain therapy and the importance of avoidance of opioids. I informed the patient that I anticipated a 4 week recovery time off of work.  We explained that after surgery we will review her definitive pathology and determine if adjuvant therapy is recommended.   HPI: Leslie Mullen is a 59 year old P3 who was seen in  consultation at the request of Dr Charlesetta Garibaldi for evaluation and treatment of grade 1 endometrial cancer.   Her symptoms began in August 2019 with postmenopausal bleeding.  She underwent an ultrasound at that time did show a uterus measuring 4.8 x  3.7 x 5.2 cm with a 5 mm endometrial stripe.  She subsequently underwent endometrial sampling which showed complex hyperplasia with atypia.  A planned hysterectomy was scheduled at that time however needed to be aborted when hemoglobin A1c testing in February 2020 was elevated at 9.5%.  The patient at that time did not have health insurance coverage for her medications.  She was started on Megace progesterone which she believes she took for approximately 3 months.  This stopped her vaginal bleeding and she discontinued it due to lack of insurance coverage of medications.  She subsequently continued to have poor diabetes control with a hemoglobin A1c in January 2021 measuring 13%.  Following that measures she was seen by an endocrinologist and started on multiple diabetes medications including Tresiba and Ozempic.  She is somewhat better financial coverage of her diabetes medication and is for the most part fairly compliant with them.  She began having postmenopausal bleeding in the summer 2021.  She returned to see Dr. Charlesetta Garibaldi who performed a transvaginal ultrasound scan on July 23, 2020.  It showed a uterus measuring 4.1 x 4.5 x 3.9 cm with an endometrial thickness of 14 mm.  She was started on progestin therapy.  A Pap test was performed on July 01, 2020 which was normal.  She underwent endometrial sampling with Pipelle in the office on September 11, 2020 which showed a well differentiated endometrioid adenocarcinoma.  Her medical history is most remarkable for morbid obesity with a BMI of 55 kg meters squared.  She has type 2 diabetes mellitus for which she takes insulin and other medications.  She has hypertension.  Her surgical history is remarkable for 3 prior cesarean sections.  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  She lives with her husband.  Family history is remarkable for a mother who died from colon cancer greater than the age of 5.  Current Meds:   Outpatient Encounter Medications as of 09/23/2020  Medication Sig  . amLODipine (NORVASC) 10 MG tablet TAKE 1 TABLET(10 MG) BY MOUTH AT BEDTIME  . BIOTIN PO Take by mouth.  Marland Kitchen CINNAMON PO Take 1,000 mg by mouth 2 (two) times daily.   . cyclobenzaprine (FLEXERIL) 5 MG tablet TAKE 1 TABLET BY MOUTH THREE TIMES DAILY AS NEEDED FOR MUSCLE SPASMS( START EVERY NIGHT AT BEDTIME AS NEEDED DUE TO SEDATION)  . gabapentin (NEURONTIN) 300 MG capsule TAKE 1 CAPSULE(300 MG) BY MOUTH THREE TIMES DAILY  . glucose blood (ONE TOUCH ULTRA TEST) test strip   . ibuprofen (ADVIL) 800 MG tablet Take 1 tablet (800 mg total) by mouth every 8 (eight) hours as needed.  . Insulin Degludec (TRESIBA FLEXTOUCH) 200 UNIT/ML SOPN Inject 70 Units into the muscle every evening.  . Insulin Pen Needle (B-D UF III MINI PEN NEEDLES) 31G X 5 MM MISC   . JARDIANCE 25 MG TABS tablet TAKE 1 TABLET BY MOUTH DAILY  . lisinopril-hydrochlorothiazide (ZESTORETIC) 20-25 MG tablet TAKE 1 TABLET BY MOUTH DAILY  . metFORMIN (GLUCOPHAGE) 1000 MG tablet TAKE 1 TABLET(1000 MG) BY MOUTH TWICE DAILY WITH A MEAL  . Misc Natural Products (OSTEO BI-FLEX ADV DOUBLE ST PO) Take 1 tablet by mouth 2 (two) times daily.  . Misc.  Devices MISC   . Multiple Vitamins-Minerals (MULTIVITAMIN WITH MINERALS) tablet Take 1 tablet by mouth daily.  . Omega-3 Fatty Acids (FISH OIL) 1000 MG CAPS Take 1,000 mg by mouth 2 (two) times daily.  Marland Kitchen omeprazole (PRILOSEC) 20 MG capsule TAKE 1 CAPSULE(20 MG) BY MOUTH AT BEDTIME  . ONETOUCH DELICA LANCETS 41O MISC   . OZEMPIC, 0.25 OR 0.5 MG/DOSE, 2 MG/1.5ML SOPN INJECT 0.5 MG INTO THE SKIN ONCE A WEEK  . pravastatin (PRAVACHOL) 40 MG tablet TAKE 1 TABLET(40 MG) BY MOUTH DAILY  . senna-docusate (SENOKOT-S) 8.6-50 MG tablet Take 2 tablets by mouth at bedtime. For AFTER surgery, hold if having loose stools  . traMADol (ULTRAM) 50 MG tablet TAKE 1 TABLET(50 MG) BY MOUTH TWICE DAILY AS NEEDED   No facility-administered encounter  medications on file as of 09/23/2020.    Allergy:  Allergies  Allergen Reactions  . Penicillins Shortness Of Breath, Itching and Swelling    Has patient had a PCN reaction causing immediate rash, facial/tongue/throat swelling, SOB or lightheadedness with hypotension: Yes Has patient had a PCN reaction causing severe rash involving mucus membranes or skin necrosis:No Has patient had a PCN reaction that required hospitalization: No Has patient had a PCN reaction occurring within the last 10 years: no If all of the above answers are "NO", then may proceed with Cephalosporin use.      Social Hx:   Social History   Socioeconomic History  . Marital status: Married    Spouse name: Not on file  . Number of children: 2  . Years of education: Not on file  . Highest education level: Not on file  Occupational History  . Occupation: Intake Coordinator  Tobacco Use  . Smoking status: Former Smoker    Types: Cigarettes  . Smokeless tobacco: Never Used  . Tobacco comment: 18 years ago.  Vaping Use  . Vaping Use: Never used  Substance and Sexual Activity  . Alcohol use: No    Alcohol/week: 0.0 standard drinks  . Drug use: No  . Sexual activity: Not on file  Other Topics Concern  . Not on file  Social History Narrative  . Not on file   Social Determinants of Health   Financial Resource Strain:   . Difficulty of Paying Living Expenses: Not on file  Food Insecurity:   . Worried About Charity fundraiser in the Last Year: Not on file  . Ran Out of Food in the Last Year: Not on file  Transportation Needs:   . Lack of Transportation (Medical): Not on file  . Lack of Transportation (Non-Medical): Not on file  Physical Activity:   . Days of Exercise per Week: Not on file  . Minutes of Exercise per Session: Not on file  Stress:   . Feeling of Stress : Not on file  Social Connections:   . Frequency of Communication with Friends and Family: Not on file  . Frequency of Social  Gatherings with Friends and Family: Not on file  . Attends Religious Services: Not on file  . Active Member of Clubs or Organizations: Not on file  . Attends Archivist Meetings: Not on file  . Marital Status: Not on file  Intimate Partner Violence:   . Fear of Current or Ex-Partner: Not on file  . Emotionally Abused: Not on file  . Physically Abused: Not on file  . Sexually Abused: Not on file    Past Surgical Hx:  Past Surgical History:  Procedure  Laterality Date  . CESAREAN SECTION     x3  . COLONOSCOPY WITH PROPOFOL N/A 06/29/2017   Procedure: COLONOSCOPY WITH PROPOFOL;  Surgeon: Irene Shipper, MD;  Location: [facility];  Service: Endoscopy;  Laterality: N/A;  . CYSTECTOMY Right 2010   Cyst removed from right groin.     Past Medical Hx:  Past Medical History:  Diagnosis Date  . Abnormal uterine bleeding   . Allergy   . Cataract    BOTH EYES  . Diabetes ([place])    type II   . Diabetic neuropathy ([place])   . GERD (gastroesophageal reflux disease)   . Hyperlipidemia   . Hypertension   . Morbid obesity ([place])   . Osteoarthritis    right knee     Past Gynecological History:  Cesarean section x 3,  No LMP recorded. Patient is postmenopausal.  Family Hx:  Family History  Adopted: Yes  Problem Relation Age of Onset  . Diabetes Father   . Heart disease Father   . Cancer Mother        colon  . Colon cancer Neg Hx   . Esophageal cancer Neg Hx   . Rectal cancer Neg Hx   . Stomach cancer Neg Hx     Review of Systems:  Constitutional  Feels well,    ENT Normal appearing ears and nares bilaterally Skin/Breast  No rash, sores, jaundice, itching, dryness Cardiovascular  No chest pain, shortness of breath, or edema  Pulmonary  No cough or wheeze.  Gastro Intestinal  No nausea, vomitting, or diarrhoea. No bright red blood per rectum, no abdominal pain, change in bowel movement, or constipation.  Genito Urinary  No frequency, urgency, dysuria, +  postmenopausal bleeding Musculo Skeletal  No myalgia, arthralgia, joint swelling or pain  Neurologic  No weakness, numbness, change in gait,  Psychology  No depression, anxiety, insomnia.   Vitals:  Blood pressure (!) 144/73, pulse 96, temperature (!) 96.8 F (36 C), temperature source Tympanic, resp. rate 18, height 5\' 8"  (1.727 m), weight (!) 364 lb 6.7 oz (165.3 kg), SpO2 100 %.  Physical Exam: WD in NAD Neck  Supple NROM, without any enlargements.  Lymph Node Survey No cervical supraclavicular or inguinal adenopathy Cardiovascular  Pulse normal rate, regularity and rhythm. S1 and S2 normal.  Lungs  Clear to auscultation bilateraly, without wheezes/crackles/rhonchi. Good air movement.  Skin  No rash/lesions/breakdown  Psychiatry  Alert and oriented to person, place, and time  Abdomen  Normoactive bowel sounds, abdomen soft, non-tender and obese without evidence of hernia. Large abdominal pannus, relatively smaller profile upper abdomen.  Back No CVA tenderness Genito Urinary  Vulva/vagina: Normal external female genitalia.  No lesions. No discharge or bleeding.  Bladder/urethra:  No lesions or masses, well supported bladder  Vagina: normal  Cervix: Normal appearing, no lesions. Easily visualized with pederson speculum.  Uterus:  Small, mobile, no parametrial involvement or nodularity.  Adnexa: no palpable masses. Rectal  deferred Extremities  No bilateral cyanosis, clubbing or edema.  60 minutes of total time was spent for this patient encounter, including preparation, face-to-face counseling with the patient and coordination of care, and documentation of the encounter.   Thereasa Solo, MD  09/23/2020, 9:54 AM

## 2020-09-23 NOTE — Patient Instructions (Signed)
We will be checking a hemoglobin A1C today which is a picture of how your blood sugar has been running over the past three months. If it is greater than 8, we will plan for a dilation and curettage of the uterus with IUD placement. If less than 8, we will proceed with the below and see how you are able to tolerate the positioning needed for the robotic surgery. If you are unable to tolerate this from a breathing standpoint, we will proceed with a dilation and curettage of the uterus with IUD placement.  Preparing for your Surgery  Plan for surgery on October 15, 2020 with Dr. Everitt Amber at [facility] will be scheduled for a robotic assisted total laparoscopic hysterectomy (removal of the uterus and cervix), bilateral salpingo-oophorectomy (removal of both ovaries and fallopian tubes), sentinel lymph node biopsy, possible dilation and curettage of the uterus, possible Mirena IUD placement.   Pre-operative Testing -You will receive a phone call from presurgical testing at [facility] to arrange for a pre-operative appointment over the phone, lab appointment, and COVID test. The COVID test normally happens 3 days prior to the surgery and they ask that you self quarantine after the test up until surgery to decrease chance of exposure.  -Bring your insurance card, copy of an advanced directive if applicable, medication list  -At that visit, you will be asked to sign a consent for a possible blood transfusion in case a transfusion becomes necessary during surgery.  The need for a blood transfusion is rare but having consent is a necessary part of your care.     -You should not be taking blood thinners or aspirin at least ten days prior to surgery unless instructed by your surgeon.  -Do not take supplements such as fish oil (omega 3), red yeast rice, turmeric before your surgery. STOP YOUR FISH OIL NOW  Day Before Surgery at [place] will be asked to take in a light diet the day  before surgery. You will be advised you can have clear liquids up until 3 hours before your surgery.    Eat a light diet the day before surgery.  Examples including soups, broths, toast, yogurt, mashed potatoes.  AVOID GAS PRODUCING FOODS. Things to avoid include carbonated beverages (fizzy beverages), raw fruits and raw vegetables, or beans.   If your bowels are filled with gas, your surgeon will have difficulty visualizing your pelvic organs which increases your surgical risks.  Your role in recovery Your role is to become active as soon as directed by your doctor, while still giving yourself time to heal.  Rest when you feel tired. You will be asked to do the following in order to speed your recovery:  - Cough and breathe deeply. This helps to clear and expand your lungs and can prevent pneumonia after surgery.  - [place]. Do mild physical activity. Walking or moving your legs help your circulation and body functions return to normal. Do not try to get up or walk alone the first time after surgery.   -If you develop swelling on one leg or the other, pain in the back of your leg, redness/warmth in one of your legs, please call the office or go to the Emergency Room to have a doppler to rule out a blood clot. For shortness of breath, chest pain-seek care in the Emergency Room as soon as possible. - Actively manage your pain. Managing your pain lets you move in  comfort. We will ask you to rate your pain on a scale of zero to 10. It is your responsibility to tell your doctor or nurse where and how much you hurt so your pain can be treated.  Special Considerations -If you are diabetic, you may be placed on insulin after surgery to have closer control over your blood sugars to promote healing and recovery.  This does not mean that you will be discharged on insulin.  If applicable, your oral antidiabetics will be resumed when you are tolerating a solid diet.  -Your final pathology  results from surgery should be available around one week after surgery and the results will be relayed to you when available.  -Dr. Lahoma Crocker is the surgeon that assists your GYN Oncologist with surgery.  If you end up staying the night, the next day after your surgery you will either see Dr. Denman George, Dr. Berline Lopes, or Dr. Lahoma Crocker.  -FMLA forms can be faxed to [phone] and please allow 5-7 business days for completion.  Pain Management After Surgery -You have been prescribed your pain medication and bowel regimen medications before surgery so that you can have these available when you are discharged from the hospital. The pain medication is for use ONLY AFTER surgery and a new prescription will not be given.   -Make sure that you have Tylenol and Ibuprofen at home to use on a regular basis after surgery for pain control. We recommend alternating the medications every hour to six hours since they work differently and are processed in the body differently for pain relief.  -Review the attached handout on narcotic use and their risks and side effects.   Bowel Regimen -You have been prescribed Sennakot-S to take nightly to prevent constipation especially if you are taking the narcotic pain medication intermittently.  It is important to prevent constipation and drink adequate amounts of liquids. You can stop taking this medication when you are not taking pain medication and you are back on your normal bowel routine.  Risks of Surgery Risks of surgery are low but include bleeding, infection, damage to surrounding structures, re-operation, blood clots, and very rarely death.   Blood Transfusion Information (For the consent to be signed before surgery)  We will be checking your blood type before surgery so in case of emergencies, we will know what type of blood you would need.                                            WHAT IS A BLOOD TRANSFUSION?  A transfusion is the  replacement of blood or some of its parts. Blood is made up of multiple cells which provide different functions.  Red blood cells carry oxygen and are used for blood loss replacement.  White blood cells fight against infection.  Platelets control bleeding.  Plasma helps clot blood.  Other blood products are available for specialized needs, such as hemophilia or other clotting disorders. BEFORE THE TRANSFUSION  Who gives blood for transfusions?   You may be able to donate blood to be used at a later date on yourself (autologous donation).  Relatives can be asked to donate blood. This is generally not any safer than if you have received blood from a stranger. The same precautions are taken to ensure safety when a relative's blood is donated.  Healthy volunteers who are fully evaluated to  make sure their blood is safe. This is blood bank blood. Transfusion therapy is the safest it has ever been in the practice of medicine. Before blood is taken from a donor, a complete history is taken to make sure that person has no history of diseases nor engages in risky social behavior (examples are intravenous drug use or sexual activity with multiple partners). The donor's travel history is screened to minimize risk of transmitting infections, such as malaria. The donated blood is tested for signs of infectious diseases, such as HIV and hepatitis. The blood is then tested to be sure it is compatible with you in order to minimize the chance of a transfusion reaction. If you or a relative donates blood, this is often done in anticipation of surgery and is not appropriate for emergency situations. It takes many days to process the donated blood. RISKS AND COMPLICATIONS Although transfusion therapy is very safe and saves many lives, the main dangers of transfusion include:   Getting an infectious disease.  Developing a transfusion reaction. This is an allergic reaction to something in the blood you were  given. Every precaution is taken to prevent this. The decision to have a blood transfusion has been considered carefully by your caregiver before blood is given. Blood is not given unless the benefits outweigh the risks.  AFTER SURGERY INSTRUCTIONS  Return to work: 4 weeks if applicable  Activity: 1. Be up and out of the bed during the day.  Take a nap if needed.  You may walk up steps but be careful and use the hand rail.  Stair climbing will tire you more than you think, you may need to stop part way and rest.   2. No lifting or straining for 6 weeks over 10 pounds. No pushing, pulling, straining for 6 weeks.  3. No driving for 1 week(s) if you have a hysterectomy. If D&C, then no driving for minimum of 48 hours if you were cleared to drive before surgery.  Do not drive if you are taking narcotic pain medicine and make sure that your reaction time has returned.   4. You can shower as soon as the next day after surgery. Shower daily.  Use your regular soap and water (not directly on the incision) and pat your incision(s) dry afterwards; don't rub.  No tub baths or submerging your body in water until cleared by your surgeon. If you have the soap that was given to you by pre-surgical testing that was used before surgery, you do not need to use it afterwards because this can irritate your incisions.   5. No sexual activity and nothing in the vagina for 8 weeks if you have a hysterectomy. If you have a D&C with IUD, then nothing in the vagina for 4 weeks.  6. You may experience a small amount of clear drainage from your incisions, which is normal.  If the drainage persists, increases, or changes color please call the office.  7. Do not use creams, lotions, or ointments such as neosporin on your incisions after surgery until advised by your surgeon because they can cause removal of the dermabond glue on your incisions.    8. You may experience vaginal spotting after surgery or around the 6-8 week  mark from surgery when the stitches at the top of the vagina begin to dissolve.  The spotting is normal but if you experience heavy bleeding, call our office.  9. Take Tylenol or ibuprofen first for pain and only use  narcotic pain medication for severe pain not relieved by the Tylenol or Ibuprofen.  Monitor your Tylenol intake to a max of 4,000 mg in a 24 hour period. You can alternate these medications after surgery.  Diet: 1. Low sodium Heart Healthy Diet is recommended but you are cleared to resume your normal (before surgery) diet after your procedure.  2. It is safe to use a laxative, such as Miralax or Colace, if you have difficulty moving your bowels. You have been prescribed Sennakot at bedtime every evening to keep bowel movements regular and to prevent constipation.    Wound Care: 1. Keep clean and dry.  Shower daily.  Reasons to call the Doctor:  Fever - Oral temperature greater than 100.4 degrees Fahrenheit  Foul-smelling vaginal discharge  Difficulty urinating  Nausea and vomiting  Increased pain at the site of the incision that is unrelieved with pain medicine.  Difficulty breathing with or without chest pain  New calf pain especially if only on one side  Sudden, continuing increased vaginal bleeding with or without clots.   Contacts: For questions or concerns you should contact:  Dr. Everitt Amber at [phone]  Joylene John, NP at [phone]  After Hours: call [phone] and have the GYN Oncologist paged/contacted (after 5 pm or on the weekends)   Intrauterine Device Insertion An intrauterine device (IUD) is a medical device that gets inserted into the uterus to prevent pregnancy. It is a small, T-shaped device that has one or two nylon strings hanging down from it. The strings hang out of the lower part of the uterus (cervix) to allow for future IUD removal. There are two types of IUDs available:  Copper IUD. This type of IUD has copper wire wrapped  around it. Copper makes the uterus and fallopian tubes produce a fluid that kills sperm. A copper IUD may last up to 10 years.  Hormone IUD. This type of IUD is made of plastic and contains the hormone progestin (synthetic progesterone). The hormone thickens mucus in the cervix and prevents sperm from entering the uterus. It also thins the uterine lining to prevent implantation of a fertilized egg. The hormone can weaken or kill the sperm that get into the uterus. A hormone IUD may last 3-5 years. Tell a health care provider about:  Any allergies you have.  All medicines you are taking, including vitamins, herbs, eye drops, creams, and over-the-counter medicines.  Any problems you or family members have had with anesthetic medicines.  Any blood disorders you have.  Any surgeries you have had.  Any medical conditions you have, including any STIs (sexually transmitted infections) you may have.  Whether you are pregnant or may be pregnant. What are the risks? Generally, this is a safe procedure. However, problems may occur, including:  Infection.  Bleeding.  Allergic reactions to medicines.  Accidental puncture (perforation) of the uterus, or damage to other structures or organs.  Accidental placement of the IUD either in the muscle layer of the uterus (myometrium) or outside the uterus.  The IUD falling out of the uterus (expulsion). This is more common among women who have recently had a child.  Pregnancy that happens in the fallopian tube (ectopic pregnancy).  Infection of the uterus and fallopian tubes (pelvic inflammatory disease). What happens before the procedure?  Schedule the IUD insertion for when you will have your menstrual period or right after, to make sure you are not pregnant. Placement of the IUD is better tolerated shortly after a menstrual cycle.  Follow instructions from your health care provider about eating or drinking restrictions.  Ask your health care  provider about changing or stopping your regular medicines. This is especially important if you are taking diabetes medicines or blood thinners.  You may get a pain reliever to take before the procedure.  You may have tests for:  Pregnancy. A pregnancy test involves having a urine sample taken.  STIs. Placing an IUD in someone who has an STI can make the infection worse.  Cervical cancer. You may have a Pap test to check for this type of cancer. This means collecting cells from your cervix to be examined under a microscope.  You may have a physical exam to determine the size and position of your uterus. The procedure may vary among health care providers and hospitals. What happens during the procedure?  A tool (speculum) will be placed in your vagina and widened so that your health care provider can see your cervix.  Medicine may be applied to your cervix to help lower your risk of infection (antiseptic medicine).  You may be given an anesthetic medicine to numb each side of your cervix (intracervical block or paracervical block). This medicine is usually given by an injection into the cervix.  A tool (uterine sound) will be inserted into your uterus to determine the length of your uterus and the direction that your uterus may be tilted.  A slim instrument or tube (IUD inserter) that holds the IUD will be inserted into your vagina, through your cervical canal, and into your uterus.  The IUD will be placed in the uterus, and the IUD inserter will be removed.  The strings that are attached to the IUD will be trimmed so that they lie just below the cervix. The procedure may vary among health care providers and hospitals. What happens after the procedure?  You may have bleeding after the procedure. This is normal. It varies from light bleeding (spotting) for a few days to menstrual-like bleeding.  You may have cramping and pain.  You may feel dizzy or light-headed.  You may have  lower back pain. Summary  An intrauterine device (IUD) is a small, T-shaped device that has one or two nylon strings hanging down from it.  Two types of IUDs are available. You may have a copper IUD or a hormone IUD.  Schedule the IUD insertion for when you will have your menstrual period or right after, to make sure you are not pregnant. Placement of the IUD is better tolerated shortly after a menstrual cycle.  You may have bleeding after the procedure. This is normal. It varies from light spotting for a few days to menstrual-like bleeding. This information is not intended to replace advice given to you by your health care provider. Make sure you discuss any questions you have with your health care provider. Document Released: 07/22/2011 Document Revised: 10/14/2016 Document Reviewed: 10/14/2016 Elsevier Interactive Patient Education  2017 [employer].  Levonorgestrel intrauterine device (IUD)  What is this medicine? LEVONORGESTREL IUD (LEE voe nor jes trel) is a contraceptive (birth control) device. The device is placed inside the uterus by a healthcare professional. It is used to treat certain conditions or prevent pregnancy. This device can also be used to treat heavy bleeding that occurs during your period. This medicine may be used for other purposes; ask your health care provider or pharmacist if you have questions. COMMON BRAND NAME(S): Minette Headland What should I tell my health care provider  before I take this medicine? They need to know if you have any of these conditions: -abnormal Pap smear -cancer of the breast, uterus, or cervix -diabetes -endometritis -genital or pelvic infection now or in the past -have more than one sexual partner or your partner has more than one partner -heart disease -history of an ectopic or tubal pregnancy -immune system problems -IUD in place -liver disease or tumor -problems with blood clots or take  blood-thinners -seizures -use intravenous drugs -uterus of unusual shape -vaginal bleeding that has not been explained -an unusual or allergic reaction to levonorgestrel, other hormones, silicone, or polyethylene, medicines, foods, dyes, or preservatives -pregnant or trying to get pregnant -breast-feeding How should I use this medicine? This device is placed inside the uterus by a health care professional. Talk to your pediatrician regarding the use of this medicine in children. Special care may be needed. Overdosage: If you think you have taken too much of this medicine contact a poison control center or emergency room at once. NOTE: This medicine is only for you. Do not share this medicine with others. What if I miss a dose? This does not apply. Depending on the brand of device you have inserted, the device will need to be replaced every 3 to 5 years if you wish to continue using this type of birth control. What may interact with this medicine? Do not take this medicine with any of the following medications: -amprenavir -bosentan -fosamprenavir This medicine may also interact with the following medications: -aprepitant -armodafinil -barbiturate medicines for inducing sleep or treating seizures -bexarotene -boceprevir -griseofulvin -medicines to treat seizures like carbamazepine, ethotoin, felbamate, oxcarbazepine, phenytoin, topiramate -modafinil -pioglitazone -rifabutin -rifampin -rifapentine -some medicines to treat HIV infection like atazanavir, efavirenz, indinavir, lopinavir, nelfinavir, tipranavir, ritonavir -St. John's wort -warfarin This list may not describe all possible interactions. Give your health care provider a list of all the medicines, herbs, non-prescription drugs, or dietary supplements you use. Also tell them if you smoke, drink alcohol, or use illegal drugs. Some items may interact with your medicine. What should I watch for while using this  medicine? Visit your doctor or health care professional for regular check ups. See your doctor if you or your partner has sexual contact with others, becomes HIV positive, or gets a sexual transmitted disease. This product does not protect you against HIV infection (AIDS) or other sexually transmitted diseases. You can check the placement of the IUD yourself by reaching up to the top of your vagina with clean fingers to feel the threads. Do not pull on the threads. It is a good habit to check placement after each menstrual period. Call your doctor right away if you feel more of the IUD than just the threads or if you cannot feel the threads at all. The IUD may come out by itself. You may become pregnant if the device comes out. If you notice that the IUD has come out use a backup birth control method like condoms and call your health care provider. Using tampons will not change the position of the IUD and are okay to use during your period. This IUD can be safely scanned with magnetic resonance imaging (MRI) only under specific conditions. Before you have an MRI, tell your healthcare provider that you have an IUD in place, and which type of IUD you have in place. What side effects may I notice from receiving this medicine? Side effects that you should report to your doctor or health care professional as soon  as possible: -allergic reactions like skin rash, itching or hives, swelling of the face, lips, or tongue -fever, flu-like symptoms -genital sores -high blood pressure -no menstrual period for 6 weeks during use -pain, swelling, warmth in the leg -pelvic pain or tenderness -severe or sudden headache -signs of pregnancy -stomach cramping -sudden shortness of breath -trouble with balance, talking, or walking -unusual vaginal bleeding, discharge -yellowing of the eyes or skin Side effects that usually do not require medical attention (report to your doctor or health care professional if they  continue or are bothersome): -acne -breast pain -change in sex drive or performance -changes in weight -cramping, dizziness, or faintness while the device is being inserted -headache -irregular menstrual bleeding within first 3 to 6 months of use -nausea This list may not describe all possible side effects. Call your doctor for medical advice about side effects. You may report side effects to FDA at 1-800-FDA-1088. Where should I keep my medicine? This does not apply. NOTE: This sheet is a summary. It may not cover all possible information. If you have questions about this medicine, talk to your doctor, pharmacist, or health care provider.  2018 Elsevier/Gold Standard (2016-09-04 14:14:56)

## 2020-09-24 ENCOUNTER — Telehealth: Payer: Self-pay

## 2020-09-24 NOTE — Telephone Encounter (Signed)
Told Ms Bowersox that her Hgb A1c was 11.5 10-18. This is too high to do a hysterectomy.  It will need to be postponed.  Dr. Denman George will proceed with a D&C and IUD placement. Dr. Denman George has an opening on her surgery schedule for 10-10-20 as patient is currently scheduled for 10-15-20. Ms Whiten will take the 10-10-20 date. Joylene John, NP notified of new surgery date.

## 2020-09-26 ENCOUNTER — Other Ambulatory Visit: Payer: Self-pay | Admitting: Family Medicine

## 2020-09-26 DIAGNOSIS — I10 Essential (primary) hypertension: Secondary | ICD-10-CM

## 2020-09-26 DIAGNOSIS — K219 Gastro-esophageal reflux disease without esophagitis: Secondary | ICD-10-CM

## 2020-09-26 NOTE — Telephone Encounter (Signed)
Requested medications are due for refill today yes  Requested medications are on the active medication list yes  Last refill 7/26  Last visit 12/2019  Future visit scheduled no  Notes to clinic Failed protocol of valid visit within 6 months.

## 2020-10-02 NOTE — Progress Notes (Addendum)
COVID Vaccine Completed:  No Date COVID Vaccine completed: COVID vaccine manufacturer: [place]   PCP - Merri Ray, MD Cardiologist - Clovis Riley, MD.  Last OV 04-01-16  Chest x-ray -  EKG - 10-03-20 in Epic Stress Test -  ECHO -  Cardiac Cath - 04-01-16 (results in Epic) Pacemaker/ICD device last checked:  Sleep Study -  CPAP -   Fasting Blood Sugar - 117-236 Checks Blood Sugar - once a day  Blood Thinner Instructions: Aspirin Instructions: Last Dose:  Anesthesia review: Hx of chest pain, SOB with abnormal EKG,  evaluation in 2017.  Uncontrolled diabetes, A1c 11.5 09/23/20, HTN, Asthma.  BMI 54.94  Patient denies shortness of breath, fever, cough and chest pain at PAT appointment   Patient verbalized understanding of instructions that were given to them at the PAT appointment. Patient was also instructed that they will need to review over the PAT instructions again at home before surgery.

## 2020-10-02 NOTE — Patient Instructions (Addendum)
DUE TO COVID-19 ONLY ONE VISITOR IS ALLOWED TO COME WITH YOU AND STAY IN THE WAITING ROOM ONLY DURING PRE OP AND PROCEDURE.   IF YOU WILL BE ADMITTED INTO THE HOSPITAL YOU ARE ALLOWED ONE SUPPORT PERSON DURING VISITATION HOURS ONLY (10AM -8PM)   . The support person may change daily. . The support person must pass our screening, gel in and out, and wear a mask at all times, including in the patient's room. . Patients must also wear a mask when staff or their support person are in the room.   COVID SWAB TESTING MUST BE COMPLETED ON:  Monday, 10-07-20 @ 10:10 AM   [address] [place], [place] [address]  (Must self quarantine after testing. Follow instructions on handout.)    Your procedure is scheduled on: Thursday, 10-10-20   Report to [facility] Main  Entrance    Report to admitting at 10:30 AM   Call this number if you have problems the morning of surgery [phone]   Do not eat food :After Midnight.   May have liquids until 9:30 AM  day of surgery  CLEAR LIQUID DIET  Foods Allowed                                                                     Foods Excluded  Water, Black Coffee and tea, regular and decaf              liquids that you cannot  Plain Jell-O in any flavor  (No red)                                     see through such as: Fruit ices (not with fruit pulp)                                      milk, soups, orange juice              Iced Popsicles (No red)                                      All solid food                                   Apple juices Sports drinks like Gatorade (No red) Lightly seasoned clear broth or consume(fat free) Sugar, honey syrup     Oral Hygiene is also important to reduce your risk of infection.                                    Remember - BRUSH YOUR TEETH THE MORNING OF SURGERY WITH YOUR REGULAR TOOTHPASTE   Do NOT smoke after Midnight   Take these medicines the morning of surgery with A SIP OF WATER:  Gabapentin,  Pravastatin  How to Manage Your Diabetes Before and After Surgery  Why is it important to control my blood sugar before and after surgery? . Improving blood sugar levels before and after surgery helps healing and can limit problems. . A way of improving blood sugar control is eating a healthy diet by: o  Eating less sugar and carbohydrates o  Increasing activity/exercise o  Talking with your doctor about reaching your blood sugar goals . High blood sugars (greater than 180 mg/dL) can raise your risk of infections and slow your recovery, so you will need to focus on controlling your diabetes during the weeks before surgery. . Make sure that the doctor who takes care of your diabetes knows about your planned surgery including the date and location.  How do I manage my blood sugar before surgery? . Check your blood sugar at least 4 times a day, starting 2 days before surgery, to make sure that the level is not too high or low. o Check your blood sugar the morning of your surgery when you wake up and every 2 hours until you get to the Short Stay unit. . If your blood sugar is less than 70 mg/dL, you will need to treat for low blood sugar: o Do not take insulin. o Treat a low blood sugar (less than 70 mg/dL) with  cup of clear juice (cranberry or apple), 4 glucose tablets, OR glucose gel. o Recheck blood sugar in 15 minutes after treatment (to make sure it is greater than 70 mg/dL). If your blood sugar is not greater than 70 mg/dL on recheck, call [phone] for further instructions. . Report your blood sugar to the short stay nurse when you get to Short Stay.  . If you are admitted to the hospital after surgery: o Your blood sugar will be checked by the staff and you will probably be given insulin after surgery (instead of oral diabetes medicines) to make sure you have good blood sugar levels. o The goal for blood sugar control after surgery is 80-180 mg/dL.   WHAT DO I  DO ABOUT MY DIABETES MEDICATION?  Marland Kitchen Do not take oral diabetes medicines (pills) the morning of surgery.  . THE DAY BEFORE SURGERY:  Do not take Jardiance    Take Metformin as prescribed    Take  35 units of Tresiba.       . THE MORNING OF SURGERY:  Do not take Jardiance, Metformin, Tresiba, Ozempic.   Reviewed and Endorsed by [facility] Patient Education Committee, August 2015              You may not have any metal on your body including hair pins, jewelry, and body piercings             Do not wear make-up, lotions, powders, perfumes/cologne, or deodorant             Do not wear nail polish.  Do not shave  48 hours prior to surgery.              Do not bring valuables to the hospital. [place].   Contacts, dentures or bridgework may not be worn into surgery.      Patients discharged the day of surgery will not be allowed to drive home.                Please read over the following fact sheets you were given: IF YOU HAVE QUESTIONS ABOUT  YOUR PRE OP INSTRUCTIONS PLEASE CALL [phone]   [facility] - Preparing for Surgery Before surgery, you can play an important role.  Because skin is not sterile, your skin needs to be as free of germs as possible.  You can reduce the number of germs on your skin by washing with CHG (chlorahexidine gluconate) soap before surgery.  CHG is an antiseptic cleaner which kills germs and bonds with the skin to continue killing germs even after washing. Please DO NOT use if you have an allergy to CHG or antibacterial soaps.  If your skin becomes reddened/irritated stop using the CHG and inform your nurse when you arrive at Short Stay. Do not shave (including legs and underarms) for at least 48 hours prior to the first CHG shower.  You may shave your face/neck.  Please follow these instructions carefully:  1.  Shower with CHG Soap the night before surgery and the  morning of surgery.  2.  If you choose to wash  your hair, wash your hair first as usual with your normal  shampoo.  3.  After you shampoo, rinse your hair and body thoroughly to remove the shampoo.                             4.  Use CHG as you would any other liquid soap.  You can apply chg directly to the skin and wash.  Gently with a scrungie or clean washcloth.  5.  Apply the CHG Soap to your body ONLY FROM THE NECK DOWN.   Do   not use on face/ open                           Wound or open sores. Avoid contact with eyes, ears mouth and   genitals (private parts).                       Wash face,  Genitals (private parts) with your normal soap.             6.  Wash thoroughly, paying special attention to the area where your    surgery  will be performed.  7.  Thoroughly rinse your body with warm water from the neck down.  8.  DO NOT shower/wash with your normal soap after using and rinsing off the CHG Soap.                9.  Pat yourself dry with a clean towel.            10.  Wear clean pajamas.            11.  Place clean sheets on your bed the night of your first shower and do not  sleep with pets. Day of Surgery : Do not apply any lotions/deodorants the morning of surgery.  Please wear clean clothes to the hospital/surgery center.  FAILURE TO FOLLOW THESE INSTRUCTIONS MAY RESULT IN THE CANCELLATION OF YOUR SURGERY  PATIENT SIGNATURE_________________________________  NURSE SIGNATURE__________________________________  ________________________________________________________________________

## 2020-10-03 ENCOUNTER — Other Ambulatory Visit: Payer: Self-pay

## 2020-10-03 ENCOUNTER — Ambulatory Visit (HOSPITAL_COMMUNITY): Payer: Managed Care, Other (non HMO) | Admitting: Emergency Medicine

## 2020-10-03 ENCOUNTER — Encounter (HOSPITAL_COMMUNITY)
Admission: RE | Admit: 2020-10-03 | Discharge: 2020-10-03 | Disposition: A | Discharge status: Home / Self Care | Payer: Managed Care, Other (non HMO) | Source: Ambulatory Visit | Attending: Gynecologic Oncology | Admitting: Gynecologic Oncology

## 2020-10-03 ENCOUNTER — Telehealth: Payer: Self-pay

## 2020-10-03 ENCOUNTER — Ambulatory Visit (HOSPITAL_COMMUNITY): Payer: Managed Care, Other (non HMO) | Admitting: Anesthesiology

## 2020-10-03 ENCOUNTER — Encounter (HOSPITAL_COMMUNITY): Payer: Self-pay

## 2020-10-03 DIAGNOSIS — Z794 Long term (current) use of insulin: Secondary | ICD-10-CM | POA: Insufficient documentation

## 2020-10-03 DIAGNOSIS — C541 Malignant neoplasm of endometrium: Secondary | ICD-10-CM | POA: Insufficient documentation

## 2020-10-03 DIAGNOSIS — Z7901 Long term (current) use of anticoagulants: Secondary | ICD-10-CM | POA: Insufficient documentation

## 2020-10-03 DIAGNOSIS — I10 Essential (primary) hypertension: Secondary | ICD-10-CM | POA: Diagnosis not present

## 2020-10-03 DIAGNOSIS — D649 Anemia, unspecified: Secondary | ICD-10-CM | POA: Diagnosis not present

## 2020-10-03 DIAGNOSIS — E118 Type 2 diabetes mellitus with unspecified complications: Secondary | ICD-10-CM | POA: Insufficient documentation

## 2020-10-03 DIAGNOSIS — Z79899 Other long term (current) drug therapy: Secondary | ICD-10-CM | POA: Insufficient documentation

## 2020-10-03 DIAGNOSIS — Z01818 Encounter for other preprocedural examination: Secondary | ICD-10-CM | POA: Diagnosis not present

## 2020-10-03 HISTORY — DX: Pneumonia, unspecified organism: J18.9

## 2020-10-03 HISTORY — DX: Anemia, unspecified: D64.9

## 2020-10-03 HISTORY — DX: Malignant neoplasm of endometrium: C54.1

## 2020-10-03 LAB — GLUCOSE, CAPILLARY: Glucose-Capillary: 156 mg/dL — ABNORMAL HIGH (ref 70–99)

## 2020-10-03 LAB — CBC
HCT: 37.8 % (ref 36.0–46.0)
Hemoglobin: 12 g/dL (ref 12.0–15.0)
MCH: 28.6 pg (ref 26.0–34.0)
MCHC: 31.7 g/dL (ref 30.0–36.0)
MCV: 90.2 fL (ref 80.0–100.0)
Platelets: 298 10*3/uL (ref 150–400)
RBC: 4.19 MIL/uL (ref 3.87–5.11)
RDW: 13.7 % (ref 11.5–15.5)
WBC: 11.1 10*3/uL — ABNORMAL HIGH (ref 4.0–10.5)
nRBC: 0 % (ref 0.0–0.2)

## 2020-10-03 LAB — COMPREHENSIVE METABOLIC PANEL
ALT: 12 U/L (ref 0–44)
AST: 13 U/L — ABNORMAL LOW (ref 15–41)
Albumin: 3.3 g/dL — ABNORMAL LOW (ref 3.5–5.0)
Alkaline Phosphatase: 56 U/L (ref 38–126)
Anion gap: 10 (ref 5–15)
BUN: 25 mg/dL — ABNORMAL HIGH (ref 6–20)
CO2: 22 mmol/L (ref 22–32)
Calcium: 9 mg/dL (ref 8.9–10.3)
Chloride: 107 mmol/L (ref 98–111)
Creatinine, Ser: 1.42 mg/dL — ABNORMAL HIGH (ref 0.44–1.00)
GFR, Estimated: 43 mL/min — ABNORMAL LOW (ref >60)
Glucose, Bld: 166 mg/dL — ABNORMAL HIGH (ref 70–99)
Potassium: 4.4 mmol/L (ref 3.5–5.1)
Sodium: 139 mmol/L (ref 135–145)
Total Bilirubin: 0.3 mg/dL (ref 0.3–1.2)
Total Protein: 7.9 g/dL (ref 6.5–8.1)

## 2020-10-03 NOTE — Telephone Encounter (Signed)
TC to patient to review lab 10/03/2020.  Creatinine trending up at 1.42mg /dL.  Patient was instructed to NOT take ibuprofen prescribed for post op discomfort, may use tylenol.  Patient stated she has not picked up rx for ibuprofen.  Patient verbalized understanding.  All other labs reviewed with patient WNL.  Lab results faxed to Dr. Merri Ray.

## 2020-10-04 NOTE — Progress Notes (Signed)
Anesthesia Chart Review:   Case: [id number] Date/Time: 10/10/20 1215   Procedures:      DILATATION AND CURETTAGE OF UTERUS (N/A )     INTRAUTERINE DEVICE (IUD) INSERTION (N/A ) - IUD FROM MAIN PHARMACY   Anesthesia type: General   Pre-op diagnosis: ENDOMETRIAL CANCER   [facility] ROOM 02 / [facility]   Surgeons: Everitt Amber, MD      DISCUSSION:  Pt is a 59 year old with hx HTN, DM, asthma, anemia  - DM is uncontrolled. Last A1c was 11.5 on 09/23/20. Dr. Denman George is aware of uncontrolled DM.    VS: BP (!) 166/54   Pulse 87   Resp 18   Ht 5\' 8"  (1.727 m)   Wt (!) 163.9 kg   SpO2 100%   BMI 54.94 kg/m    PROVIDERS: - PCP is [name], MD   LABS:  - HbA1c was 11.5 on 09/23/20  (all labs ordered are listed, but only abnormal results are displayed)  Labs Reviewed  CBC - Abnormal; Notable for the following components:      Result Value   WBC 11.1 (*)    All other components within normal limits  COMPREHENSIVE METABOLIC PANEL - Abnormal; Notable for the following components:   Glucose, Bld 166 (*)    BUN 25 (*)    Creatinine, Ser 1.42 (*)    Albumin 3.3 (*)    AST 13 (*)    GFR, Estimated 43 (*)    All other components within normal limits  GLUCOSE, CAPILLARY - Abnormal; Notable for the following components:   Glucose-Capillary 156 (*)    All other components within normal limits     EKG 10/03/20: NSR. Cannot rule out Anterior infarct, age undetermined   CV: Cardiac cath 04/01/16 (care everywhere):  1. Successful transradial cardiac catheterization  2. Widely patent coronary arteries  3. Left ventricular ejection fraction 70%  4. Left ventricular end-diastolic pressure 8     Past Medical History:  Diagnosis Date  . Abnormal uterine bleeding   . Allergy   . Anemia   . Asthma   . Cataract    BOTH EYES  . Diabetes ([place])    type II   . Diabetic neuropathy ([place])   . Endometrial cancer ([place])   . GERD (gastroesophageal reflux disease)   .  Hyperlipidemia   . Hypertension   . Morbid obesity ([place])   . Osteoarthritis    right knee   . Pneumonia     Past Surgical History:  Procedure Laterality Date  . CERVICAL BIOPSY    . CESAREAN SECTION     x3  . COLONOSCOPY WITH PROPOFOL N/A 06/29/2017   Procedure: COLONOSCOPY WITH PROPOFOL;  Surgeon: Irene Shipper, MD;  Location[facility];  Service: Endoscopy;  Laterality: N/A;  . CYSTECTOMY Right 2010   Cyst removed from right groin.   . TONSILLECTOMY      MEDICATIONS: . amLODipine (NORVASC) 10 MG tablet  . BIOTIN PO  . CINNAMON PO  . Colloidal Oatmeal 2 % CREA  . cyclobenzaprine (FLEXERIL) 5 MG tablet  . ferrous sulfate 325 (65 FE) MG tablet  . gabapentin (NEURONTIN) 300 MG capsule  . glucose blood (ONE TOUCH ULTRA TEST) test strip  . ibuprofen (ADVIL) 800 MG tablet  . Insulin Degludec (TRESIBA FLEXTOUCH) 200 UNIT/ML SOPN  . Insulin Pen Needle (B-D UF III MINI PEN NEEDLES) 31G X 5 MM MISC  . JARDIANCE 25 MG TABS tablet  .  lisinopril-hydrochlorothiazide (ZESTORETIC) 20-25 MG tablet  . medroxyPROGESTERone (PROVERA) 10 MG tablet  . metFORMIN (GLUCOPHAGE) 1000 MG tablet  . Misc Natural Products (OSTEO BI-FLEX ADV DOUBLE ST PO)  . Misc. Devices MISC  . Multiple Vitamins-Minerals (MULTIVITAMIN WITH MINERALS) tablet  . Omega-3 Fatty Acids (FISH OIL) 1000 MG CAPS  . omeprazole (PRILOSEC) 20 MG capsule  . ONETOUCH DELICA LANCETS 23N MISC  . OZEMPIC, 0.25 OR 0.5 MG/DOSE, 2 MG/1.5ML SOPN  . pravastatin (PRAVACHOL) 40 MG tablet  . senna-docusate (SENOKOT-S) 8.6-50 MG tablet  . traMADol (ULTRAM) 50 MG tablet  . traMADol (ULTRAM) 50 MG tablet   No current facility-administered medications for this encounter.    If blood glucose acceptable day of surgery, I anticipate pt can proceed with surgery as scheduled.  Willeen Cass, PhD, FNP-BC [facility]/Anesthesiology Phone: [phone] 10/04/2020 9:01 AM

## 2020-10-04 NOTE — Anesthesia Preprocedure Evaluation (Addendum)
Anesthesia Evaluation    Reviewed: Allergy & Precautions, Patient's Chart, lab work & pertinent test results  Airway        Dental   Pulmonary asthma , former smoker,           Cardiovascular hypertension, Pt. on medications    Cardiac cath 04/01/16 (care everywhere):  1. Successful transradial cardiac catheterization  2. Widely patent coronary arteries  3. Left ventricular ejection fraction 70%  4. Left ventricular end-diastolic pressure 8    Neuro/Psych negative neurological ROS  negative psych ROS   GI/Hepatic Neg liver ROS, GERD  Medicated and Controlled,  Endo/Other  diabetes, Poorly Controlled, Type 2, Insulin Dependent, Oral Hypoglycemic AgentsMorbid obesityBMI 55 Last a1c 11.5  Renal/GU Renal InsufficiencyRenal diseaseCr 1.42   Endometrial ca    Musculoskeletal  (+) Arthritis , Osteoarthritis,    Abdominal (+) + obese,   Peds  Hematology negative hematology ROS (+) hct 37.8, plt 298   Anesthesia Other Findings   Reproductive/Obstetrics negative OB ROS                            Anesthesia Physical Anesthesia Plan  ASA: III  Anesthesia Plan: General   Post-op Pain Management:    Induction: Intravenous  PONV Risk Score and Plan: 3 and Ondansetron, Dexamethasone, Midazolam, Treatment may vary due to age or medical condition and Scopolamine patch - Pre-op  Airway Management Planned: LMA and Oral ETT  Additional Equipment: None  Intra-op Plan:   Post-operative Plan: Extubation in OR  Informed Consent:   Plan Discussed with:   Anesthesia Plan Comments:        Anesthesia Quick Evaluation

## 2020-10-07 ENCOUNTER — Other Ambulatory Visit (HOSPITAL_COMMUNITY)
Admission: RE | Admit: 2020-10-07 | Discharge: 2020-10-07 | Disposition: A | Discharge status: Home / Self Care | Payer: Managed Care, Other (non HMO) | Source: Ambulatory Visit | Attending: Gynecologic Oncology | Admitting: Gynecologic Oncology

## 2020-10-07 DIAGNOSIS — I442 Atrioventricular block, complete: Secondary | ICD-10-CM | POA: Diagnosis not present

## 2020-10-07 DIAGNOSIS — Z01812 Encounter for preprocedural laboratory examination: Secondary | ICD-10-CM | POA: Insufficient documentation

## 2020-10-07 DIAGNOSIS — Z20822 Contact with and (suspected) exposure to covid-19: Secondary | ICD-10-CM | POA: Insufficient documentation

## 2020-10-07 DIAGNOSIS — I459 Conduction disorder, unspecified: Secondary | ICD-10-CM | POA: Diagnosis not present

## 2020-10-07 LAB — SARS CORONAVIRUS 2 (TAT 6-24 HRS): SARS Coronavirus 2: NEGATIVE

## 2020-10-09 ENCOUNTER — Telehealth: Payer: Self-pay

## 2020-10-09 NOTE — Telephone Encounter (Signed)
LM for Leslie Mullen to call back to the office at [phone] to discuss pre op instructions for tomorrow.

## 2020-10-09 NOTE — Telephone Encounter (Signed)
Patient returned TC.  Preop instructions reviewed, patient understands all preop/medication instructions.  Patient will call with any questions or concerns.

## 2020-10-10 ENCOUNTER — Inpatient Hospital Stay (HOSPITAL_COMMUNITY): Payer: Managed Care, Other (non HMO)

## 2020-10-10 ENCOUNTER — Encounter (HOSPITAL_COMMUNITY): Payer: Self-pay | Admitting: Gynecologic Oncology

## 2020-10-10 ENCOUNTER — Encounter (HOSPITAL_COMMUNITY): Payer: Self-pay

## 2020-10-10 ENCOUNTER — Encounter (HOSPITAL_COMMUNITY)
Admission: RE | Disposition: A | Discharge status: Still In Hospital | Payer: Self-pay | Source: Home / Self Care | Attending: Gynecologic Oncology

## 2020-10-10 ENCOUNTER — Emergency Department (HOSPITAL_COMMUNITY): Payer: Managed Care, Other (non HMO)

## 2020-10-10 ENCOUNTER — Other Ambulatory Visit: Payer: Self-pay

## 2020-10-10 ENCOUNTER — Inpatient Hospital Stay (HOSPITAL_COMMUNITY)
Admission: EM | Admit: 2020-10-10 | Discharge: 2020-10-12 | DRG: 243 | Disposition: A | Discharge status: Home / Self Care | Payer: Managed Care, Other (non HMO) | Source: Ambulatory Visit | Attending: Internal Medicine | Admitting: Internal Medicine

## 2020-10-10 ENCOUNTER — Ambulatory Visit (HOSPITAL_COMMUNITY)
Admission: RE | Admit: 2020-10-10 | Discharge: 2020-10-10 | Disposition: A | Discharge status: Still In Hospital | Payer: Managed Care, Other (non HMO) | Source: Home / Self Care | Attending: Gynecologic Oncology | Admitting: Gynecologic Oncology

## 2020-10-10 DIAGNOSIS — Z20822 Contact with and (suspected) exposure to covid-19: Secondary | ICD-10-CM | POA: Diagnosis present

## 2020-10-10 DIAGNOSIS — Z880 Allergy status to penicillin: Secondary | ICD-10-CM | POA: Diagnosis not present

## 2020-10-10 DIAGNOSIS — M1990 Unspecified osteoarthritis, unspecified site: Secondary | ICD-10-CM | POA: Diagnosis present

## 2020-10-10 DIAGNOSIS — I10 Essential (primary) hypertension: Secondary | ICD-10-CM | POA: Diagnosis present

## 2020-10-10 DIAGNOSIS — Z87891 Personal history of nicotine dependence: Secondary | ICD-10-CM | POA: Insufficient documentation

## 2020-10-10 DIAGNOSIS — D631 Anemia in chronic kidney disease: Secondary | ICD-10-CM | POA: Diagnosis present

## 2020-10-10 DIAGNOSIS — E1140 Type 2 diabetes mellitus with diabetic neuropathy, unspecified: Secondary | ICD-10-CM | POA: Diagnosis present

## 2020-10-10 DIAGNOSIS — K219 Gastro-esophageal reflux disease without esophagitis: Secondary | ICD-10-CM | POA: Diagnosis present

## 2020-10-10 DIAGNOSIS — Z950 Presence of cardiac pacemaker: Secondary | ICD-10-CM

## 2020-10-10 DIAGNOSIS — G2581 Restless legs syndrome: Secondary | ICD-10-CM

## 2020-10-10 DIAGNOSIS — I442 Atrioventricular block, complete: Principal | ICD-10-CM | POA: Diagnosis present

## 2020-10-10 DIAGNOSIS — E1142 Type 2 diabetes mellitus with diabetic polyneuropathy: Secondary | ICD-10-CM

## 2020-10-10 DIAGNOSIS — W19XXXA Unspecified fall, initial encounter: Secondary | ICD-10-CM | POA: Diagnosis present

## 2020-10-10 DIAGNOSIS — Z794 Long term (current) use of insulin: Secondary | ICD-10-CM | POA: Diagnosis not present

## 2020-10-10 DIAGNOSIS — E1122 Type 2 diabetes mellitus with diabetic chronic kidney disease: Secondary | ICD-10-CM | POA: Diagnosis present

## 2020-10-10 DIAGNOSIS — R55 Syncope and collapse: Secondary | ICD-10-CM | POA: Insufficient documentation

## 2020-10-10 DIAGNOSIS — Z79899 Other long term (current) drug therapy: Secondary | ICD-10-CM

## 2020-10-10 DIAGNOSIS — N1831 Chronic kidney disease, stage 3a: Secondary | ICD-10-CM | POA: Diagnosis present

## 2020-10-10 DIAGNOSIS — Y92009 Unspecified place in unspecified non-institutional (private) residence as the place of occurrence of the external cause: Secondary | ICD-10-CM | POA: Diagnosis present

## 2020-10-10 DIAGNOSIS — M25511 Pain in right shoulder: Secondary | ICD-10-CM | POA: Diagnosis present

## 2020-10-10 DIAGNOSIS — Z8542 Personal history of malignant neoplasm of other parts of uterus: Secondary | ICD-10-CM | POA: Diagnosis not present

## 2020-10-10 DIAGNOSIS — C541 Malignant neoplasm of endometrium: Secondary | ICD-10-CM | POA: Diagnosis present

## 2020-10-10 DIAGNOSIS — R739 Hyperglycemia, unspecified: Secondary | ICD-10-CM

## 2020-10-10 DIAGNOSIS — E785 Hyperlipidemia, unspecified: Secondary | ICD-10-CM | POA: Diagnosis present

## 2020-10-10 DIAGNOSIS — Z6841 Body Mass Index (BMI) 40.0 and over, adult: Secondary | ICD-10-CM

## 2020-10-10 DIAGNOSIS — Z7984 Long term (current) use of oral hypoglycemic drugs: Secondary | ICD-10-CM

## 2020-10-10 DIAGNOSIS — E1165 Type 2 diabetes mellitus with hyperglycemia: Secondary | ICD-10-CM | POA: Diagnosis present

## 2020-10-10 DIAGNOSIS — N179 Acute kidney failure, unspecified: Secondary | ICD-10-CM | POA: Diagnosis present

## 2020-10-10 DIAGNOSIS — R001 Bradycardia, unspecified: Secondary | ICD-10-CM | POA: Diagnosis present

## 2020-10-10 DIAGNOSIS — E119 Type 2 diabetes mellitus without complications: Secondary | ICD-10-CM | POA: Insufficient documentation

## 2020-10-10 DIAGNOSIS — I459 Conduction disorder, unspecified: Secondary | ICD-10-CM | POA: Diagnosis present

## 2020-10-10 HISTORY — DX: Atrioventricular block, complete: I44.2

## 2020-10-10 LAB — CBC WITH DIFFERENTIAL/PLATELET
Abs Immature Granulocytes: 0.06 10*3/uL (ref 0.00–0.07)
Basophils Absolute: 0 10*3/uL (ref 0.0–0.1)
Basophils Relative: 0 %
Eosinophils Absolute: 0.1 10*3/uL (ref 0.0–0.5)
Eosinophils Relative: 1 %
HCT: 35.8 % — ABNORMAL LOW (ref 36.0–46.0)
Hemoglobin: 11.5 g/dL — ABNORMAL LOW (ref 12.0–15.0)
Immature Granulocytes: 1 %
Lymphocytes Relative: 14 %
Lymphs Abs: 1.8 10*3/uL (ref 0.7–4.0)
MCH: 28.3 pg (ref 26.0–34.0)
MCHC: 32.1 g/dL (ref 30.0–36.0)
MCV: 88 fL (ref 80.0–100.0)
Monocytes Absolute: 0.6 10*3/uL (ref 0.1–1.0)
Monocytes Relative: 5 %
Neutro Abs: 10.4 10*3/uL — ABNORMAL HIGH (ref 1.7–7.7)
Neutrophils Relative %: 79 %
Platelets: 271 10*3/uL (ref 150–400)
RBC: 4.07 MIL/uL (ref 3.87–5.11)
RDW: 13.4 % (ref 11.5–15.5)
WBC: 13 10*3/uL — ABNORMAL HIGH (ref 4.0–10.5)
nRBC: 0 % (ref 0.0–0.2)

## 2020-10-10 LAB — BASIC METABOLIC PANEL
Anion gap: 11 (ref 5–15)
BUN: 32 mg/dL — ABNORMAL HIGH (ref 6–20)
CO2: 21 mmol/L — ABNORMAL LOW (ref 22–32)
Calcium: 9.1 mg/dL (ref 8.9–10.3)
Chloride: 104 mmol/L (ref 98–111)
Creatinine, Ser: 1.69 mg/dL — ABNORMAL HIGH (ref 0.44–1.00)
GFR, Estimated: 35 mL/min — ABNORMAL LOW (ref >60)
Glucose, Bld: 343 mg/dL — ABNORMAL HIGH (ref 70–99)
Potassium: 4.9 mmol/L (ref 3.5–5.1)
Sodium: 136 mmol/L (ref 135–145)

## 2020-10-10 LAB — RESPIRATORY PANEL BY RT PCR (FLU A&B, COVID)
Influenza A by PCR: NEGATIVE
Influenza B by PCR: NEGATIVE
SARS Coronavirus 2 by RT PCR: NEGATIVE

## 2020-10-10 LAB — MAGNESIUM: Magnesium: 2.2 mg/dL (ref 1.7–2.4)

## 2020-10-10 LAB — GLUCOSE, CAPILLARY
Glucose-Capillary: 277 mg/dL — ABNORMAL HIGH (ref 70–99)
Glucose-Capillary: 377 mg/dL — ABNORMAL HIGH (ref 70–99)
Glucose-Capillary: 331 mg/dL — ABNORMAL HIGH (ref 70–99)

## 2020-10-10 SURGERY — DILATION AND CURETTAGE
Anesthesia: General

## 2020-10-10 MED ORDER — ENOXAPARIN SODIUM 40 MG/0.4ML ~~LOC~~ SOLN
40.0000 mg | SUBCUTANEOUS | Status: DC
  Filled 2020-10-10: qty 0.4

## 2020-10-10 MED ORDER — SCOPOLAMINE 1 MG/3DAYS TD PT72
1.0000 | MEDICATED_PATCH | TRANSDERMAL | Status: DC
  Filled 2020-10-10: qty 1

## 2020-10-10 MED ORDER — FERROUS SULFATE 325 (65 FE) MG PO TABS
325.0000 mg | ORAL_TABLET | Freq: Two times a day (BID) | ORAL | Status: DC
  Administered 2020-10-11 – 2020-10-12 (×2): 325 mg via ORAL
  Filled 2020-10-10 (×2): qty 1

## 2020-10-10 MED ORDER — ACETAMINOPHEN 325 MG PO TABS
650.0000 mg | ORAL_TABLET | Freq: Four times a day (QID) | ORAL | Status: DC | PRN
  Administered 2020-10-11: 650 mg via ORAL
  Filled 2020-10-10: qty 2

## 2020-10-10 MED ORDER — GABAPENTIN 300 MG PO CAPS
300.0000 mg | ORAL_CAPSULE | ORAL | Status: DC
  Filled 2020-10-10: qty 1

## 2020-10-10 MED ORDER — ACETAMINOPHEN 500 MG PO TABS: 1000.0000 mg | ORAL_TABLET | Freq: Once | ORAL | Status: DC

## 2020-10-10 MED ORDER — LEVONORGESTREL 20 MCG/24HR IU IUD: INTRAUTERINE_SYSTEM | INTRAUTERINE | Status: DC

## 2020-10-10 MED ORDER — INSULIN GLARGINE 100 UNIT/ML ~~LOC~~ SOLN
40.0000 [IU] | Freq: Every evening | SUBCUTANEOUS | Status: DC
  Administered 2020-10-10 – 2020-10-11 (×2): 40 [IU] via SUBCUTANEOUS
  Filled 2020-10-10 (×3): qty 0.4

## 2020-10-10 MED ORDER — INSULIN ASPART 100 UNIT/ML ~~LOC~~ SOLN: 0.0000 [IU] | Freq: Every day | SUBCUTANEOUS | Status: DC

## 2020-10-10 MED ORDER — INSULIN ASPART 100 UNIT/ML ~~LOC~~ SOLN
0.0000 [IU] | SUBCUTANEOUS | Status: DC
  Administered 2020-10-10: 16 [IU] via SUBCUTANEOUS
  Administered 2020-10-11: 4 [IU] via SUBCUTANEOUS
  Administered 2020-10-11 (×2): 8 [IU] via SUBCUTANEOUS
  Administered 2020-10-12: 4 [IU] via SUBCUTANEOUS
  Administered 2020-10-12: 8 [IU] via SUBCUTANEOUS
  Administered 2020-10-12: 12 [IU] via SUBCUTANEOUS
  Administered 2020-10-12: 8 [IU] via SUBCUTANEOUS

## 2020-10-10 MED ORDER — AMLODIPINE BESYLATE 10 MG PO TABS: 10.0000 mg | ORAL_TABLET | Freq: Every day | ORAL | Status: DC

## 2020-10-10 MED ORDER — LISINOPRIL 10 MG PO TABS
20.0000 mg | ORAL_TABLET | Freq: Every day | ORAL | Status: DC
  Administered 2020-10-11 – 2020-10-12 (×2): 20 mg via ORAL
  Filled 2020-10-10 (×2): qty 2

## 2020-10-10 MED ORDER — GABAPENTIN 300 MG PO CAPS: 300.0000 mg | ORAL_CAPSULE | Freq: Two times a day (BID) | ORAL | Status: DC

## 2020-10-10 MED ORDER — SENNOSIDES-DOCUSATE SODIUM 8.6-50 MG PO TABS
2.0000 | ORAL_TABLET | Freq: Every day | ORAL | Status: DC
  Filled 2020-10-10: qty 2

## 2020-10-10 MED ORDER — ORAL CARE MOUTH RINSE: 15.0000 mL | Freq: Once | OROMUCOSAL | Status: DC

## 2020-10-10 MED ORDER — PRAVASTATIN SODIUM 40 MG PO TABS
40.0000 mg | ORAL_TABLET | Freq: Every evening | ORAL | Status: DC
  Administered 2020-10-11: 40 mg via ORAL
  Filled 2020-10-10: qty 1

## 2020-10-10 MED ORDER — LACTATED RINGERS IV SOLN: INTRAVENOUS | Status: DC

## 2020-10-10 MED ORDER — GABAPENTIN 600 MG PO TABS
300.0000 mg | ORAL_TABLET | Freq: Two times a day (BID) | ORAL | Status: DC
  Administered 2020-10-10 – 2020-10-12 (×4): 300 mg via ORAL
  Filled 2020-10-10 (×4): qty 1

## 2020-10-10 MED ORDER — INSULIN ASPART 100 UNIT/ML ~~LOC~~ SOLN: 0.0000 [IU] | Freq: Three times a day (TID) | SUBCUTANEOUS | Status: DC

## 2020-10-10 MED ORDER — AMLODIPINE BESYLATE 10 MG PO TABS
10.0000 mg | ORAL_TABLET | Freq: Every day | ORAL | Status: DC
  Administered 2020-10-10 – 2020-10-12 (×3): 10 mg via ORAL
  Filled 2020-10-10 (×3): qty 1

## 2020-10-10 MED ORDER — LISINOPRIL-HYDROCHLOROTHIAZIDE 20-25 MG PO TABS: 1.0000 | ORAL_TABLET | Freq: Every day | ORAL | Status: DC

## 2020-10-10 MED ORDER — ENOXAPARIN SODIUM 40 MG/0.4ML ~~LOC~~ SOLN
40.0000 mg | Freq: Once | SUBCUTANEOUS | Status: AC
  Administered 2020-10-10: 40 mg via SUBCUTANEOUS
  Filled 2020-10-10: qty 0.4

## 2020-10-10 MED ORDER — PERFLUTREN LIPID MICROSPHERE
1.0000 mL | INTRAVENOUS | Status: AC | PRN
  Administered 2020-10-10: 3 mL via INTRAVENOUS
  Filled 2020-10-10: qty 10

## 2020-10-10 MED ORDER — EMPAGLIFLOZIN 25 MG PO TABS
25.0000 mg | ORAL_TABLET | Freq: Every day | ORAL | Status: DC
  Administered 2020-10-11 – 2020-10-12 (×2): 25 mg via ORAL
  Filled 2020-10-10 (×2): qty 1

## 2020-10-10 MED ORDER — PANTOPRAZOLE SODIUM 40 MG PO TBEC
40.0000 mg | DELAYED_RELEASE_TABLET | Freq: Every day | ORAL | Status: DC
  Administered 2020-10-10 – 2020-10-11 (×2): 40 mg via ORAL
  Filled 2020-10-10 (×2): qty 1

## 2020-10-10 MED ORDER — SCOPOLAMINE 1 MG/3DAYS TD PT72: 1.0000 | MEDICATED_PATCH | TRANSDERMAL | Status: DC

## 2020-10-10 MED ORDER — TRAMADOL HCL 50 MG PO TABS
50.0000 mg | ORAL_TABLET | Freq: Four times a day (QID) | ORAL | Status: DC | PRN
  Administered 2020-10-11: 50 mg via ORAL
  Filled 2020-10-10: qty 1

## 2020-10-10 MED ORDER — ACETAMINOPHEN 500 MG PO TABS
1000.0000 mg | ORAL_TABLET | ORAL | Status: DC
  Filled 2020-10-10: qty 2

## 2020-10-10 MED ORDER — DEXTROSE 50 % IV SOLN: 12.5000 g | INTRAVENOUS | Status: AC

## 2020-10-10 MED ORDER — CHLORHEXIDINE GLUCONATE 0.12 % MT SOLN: 15.0000 mL | Freq: Once | OROMUCOSAL | Status: DC

## 2020-10-10 NOTE — H&P (Signed)
Physician History and Physical     Patient ID: Leslie Mullen MRN: [id number] DOB/AGE: [birth year] 58 y.o. Admit date: 10/10/2020  Primary Care Physician: [name], MD Primary Cardiologist: New ? Quentin Ore post PPM  Active Problems:   Heart block   HPI:  59 y.o. seen for pre syncope and heart block. She was at [facility] today for [facility] procedure D&C with IUD. She went to bathroom and had presyncope. Has had multiple episodes on monitor All are preceded by a frequency sound in her ears then 5-9 second pauses with ventricular standstill and just P waves marching through Her baseline ECG is normal. She notes an episode of "starring" in 2019 and 2020 with brief period of unresponsiveness. No history of head trauma/seizures. She is on meds for BP including ACE, norvasc and diuretic but no beta blocker K is 4.9 Cr 1.7 BS high need to order TSH.  No history of sarcoid , RA or other connective tissue disease. She has no chest pain , dyspnea, palpitations or frank syncope in past.   Review of systems complete and found to be negative unless listed above   Past Medical History:  Diagnosis Date  . Abnormal uterine bleeding   . Allergy   . Anemia   . Asthma   . Cataract    BOTH EYES  . Diabetes ([place])    type II   . Diabetic neuropathy ([place])   . Endometrial cancer ([place])   . GERD (gastroesophageal reflux disease)   . Hyperlipidemia   . Hypertension   . Morbid obesity ([place])   . Osteoarthritis    right knee   . Pneumonia     Family History  Adopted: Yes  Problem Relation Age of Onset  . Diabetes Father   . Heart disease Father   . Cancer Mother        colon  . Colon cancer Neg Hx   . Esophageal cancer Neg Hx   . Rectal cancer Neg Hx   . Stomach cancer Neg Hx     Social History   Socioeconomic History  . Marital status: Married    Spouse name: Not on file  . Number of children: 2  . Years of education: Not on file  . Highest education level: Not on file  Occupational History  .  Occupation: Intake Coordinator  Tobacco Use  . Smoking status: Former Smoker    Types: Cigarettes  . Smokeless tobacco: Never Used  . Tobacco comment: 18 years ago.  Vaping Use  . Vaping Use: Never used  Substance and Sexual Activity  . Alcohol use: No    Alcohol/week: 0.0 standard drinks  . Drug use: No  . Sexual activity: Not on file  Other Topics Concern  . Not on file  Social History Narrative  . Not on file   Social Determinants of Health   Financial Resource Strain:   . Difficulty of Paying Living Expenses: Not on file  Food Insecurity:   . Worried About Charity fundraiser in the Last Year: Not on file  . Ran Out of Food in the Last Year: Not on file  Transportation Needs:   . Lack of Transportation (Medical): Not on file  . Lack of Transportation (Non-Medical): Not on file  Physical Activity:   . Days of Exercise per Week: Not on file  . Minutes of Exercise per Session: Not on file  Stress:   . Feeling of Stress : Not on file  Social Connections:   .  Frequency of Communication with Friends and Family: Not on file  . Frequency of Social Gatherings with Friends and Family: Not on file  . Attends Religious Services: Not on file  . Active Member of Clubs or Organizations: Not on file  . Attends Archivist Meetings: Not on file  . Marital Status: Not on file  Intimate Partner Violence:   . Fear of Current or Ex-Partner: Not on file  . Emotionally Abused: Not on file  . Physically Abused: Not on file  . Sexually Abused: Not on file    Past Surgical History:  Procedure Laterality Date  . CERVICAL BIOPSY    . CESAREAN SECTION     x3  . COLONOSCOPY WITH PROPOFOL N/A 06/29/2017   Procedure: COLONOSCOPY WITH PROPOFOL;  Surgeon: Irene Shipper, MD;  [facility];  Service: Endoscopy;  Laterality: N/A;  . CYSTECTOMY Right 2010   Cyst removed from right groin.   . TONSILLECTOMY       (Not in a hospital admission)   Physical Exam: Blood  pressure (!) 188/79, pulse 95, temperature 98 F (36.7 C), temperature source Oral, resp. rate 17, height 5\' 8"  (1.727 m), weight (!) 163.7 kg, SpO2 100 %.   Affect appropriate Overweight black female  HEENT: normal Neck supple with no adenopathy JVP normal no bruits no thyromegaly Lungs clear with no wheezing and good diaphragmatic motion Heart:  S1/S2 no murmur, no rub, gallop or click PMI normal Abdomen: benighn, BS positve, no tenderness, no AAA no bruit.  No HSM or HJR Distal pulses intact with no bruits No edema Neuro non-focal Skin warm and dry No muscular weakness  No current facility-administered medications on file prior to encounter.   Current Outpatient Medications on File Prior to Encounter  Medication Sig Dispense Refill  . amLODipine (NORVASC) 10 MG tablet TAKE 1 TABLET(10 MG) BY MOUTH AT BEDTIME (Patient taking differently: Take 10 mg by mouth at bedtime. ) 90 tablet 0  . Colloidal Oatmeal 2 % CREA Apply 1 application topically in the morning and at bedtime.    . ferrous sulfate 325 (65 FE) MG tablet Take 325 mg by mouth 2 (two) times daily with a meal.    . gabapentin (NEURONTIN) 300 MG capsule TAKE 1 CAPSULE(300 MG) BY MOUTH THREE TIMES DAILY (Patient taking differently: Take 300 mg by mouth 2 (two) times daily. ) 270 capsule 3  . ibuprofen (ADVIL) 800 MG tablet Take 1 tablet (800 mg total) by mouth every 8 (eight) hours as needed for moderate pain. For AFTER surgery only 30 tablet 0  . Insulin Degludec (TRESIBA FLEXTOUCH) 200 UNIT/ML SOPN Inject 70 Units into the muscle every evening. (Patient taking differently: Inject 80 Units into the muscle every evening. ) 45 mL 3  . JARDIANCE 25 MG TABS tablet TAKE 1 TABLET BY MOUTH DAILY (Patient taking differently: Take 25 mg by mouth daily. ) 90 tablet 1  . lisinopril-hydrochlorothiazide (ZESTORETIC) 20-25 MG tablet TAKE 1 TABLET BY MOUTH DAILY 90 tablet 0  . medroxyPROGESTERone (PROVERA) 10 MG tablet Take 10 mg by mouth  daily.    . metFORMIN (GLUCOPHAGE) 1000 MG tablet TAKE 1 TABLET(1000 MG) BY MOUTH TWICE DAILY WITH A MEAL (Patient taking differently: Take 1,000 mg by mouth 2 (two) times daily with a meal. ) 180 tablet 0  . omeprazole (PRILOSEC) 20 MG capsule TAKE 1 CAPSULE(20 MG) BY MOUTH AT BEDTIME (Patient taking differently: Take 20 mg by mouth at bedtime. ) 90 capsule 0  .  OZEMPIC, 0.25 OR 0.5 MG/DOSE, 2 MG/1.5ML SOPN INJECT 0.5 MG INTO THE SKIN ONCE A WEEK 4.5 mL 0  . pravastatin (PRAVACHOL) 40 MG tablet TAKE 1 TABLET(40 MG) BY MOUTH DAILY (Patient taking differently: Take 40 mg by mouth every evening. ) 90 tablet 1  . senna-docusate (SENOKOT-S) 8.6-50 MG tablet Take 2 tablets by mouth at bedtime. For AFTER surgery, hold if having loose stools 30 tablet 1  . traMADol (ULTRAM) 50 MG tablet TAKE 1 TABLET(50 MG) BY MOUTH TWICE DAILY AS NEEDED (Patient taking differently: Take 50 mg by mouth 2 (two) times daily. ) 60 tablet 0  . traMADol (ULTRAM) 50 MG tablet Take 1 tablet (50 mg total) by mouth every 6 (six) hours as needed for severe pain. For AFTER surgery pain only, do not take and drive 10 tablet 0  . cyclobenzaprine (FLEXERIL) 5 MG tablet TAKE 1 TABLET BY MOUTH THREE TIMES DAILY AS NEEDED FOR MUSCLE SPASMS( START EVERY NIGHT AT BEDTIME AS NEEDED DUE TO SEDATION) (Patient not taking: Reported on 10/01/2020) 15 tablet o  . glucose blood (ONE TOUCH ULTRA TEST) test strip     . Insulin Pen Needle (B-D UF III MINI PEN NEEDLES) 31G X 5 MM MISC     . ONETOUCH DELICA LANCETS 95A MISC       Labs:   Lab Results  Component Value Date   WBC 13.0 (H) 10/10/2020   HGB 11.5 (L) 10/10/2020   HCT 35.8 (L) 10/10/2020   MCV 88.0 10/10/2020   PLT 271 10/10/2020    Recent Labs  Lab 10/10/20 1220  NA 136  K 4.9  CL 104  CO2 21*  BUN 32*  CREATININE 1.69*  CALCIUM 9.1  GLUCOSE 343*   No results found for: CKTOTAL, CKMB, CKMBINDEX, TROPONINI   Lab Results  Component Value Date   CHOL 155 12/22/2019   Lab  Results  Component Value Date   HDL 47 12/22/2019   Lab Results  Component Value Date   LDLCALC 69 12/22/2019   Lab Results  Component Value Date   TRIG 238 (H) 12/22/2019   Lab Results  Component Value Date   CHOLHDL 3.3 12/22/2019   No results found for: LDLDIRECT     Radiology: DG Scapula Right  Result Date: 10/10/2020 CLINICAL DATA:  Fall, pain. EXAM: RIGHT SCAPULA - 2+ VIEWS COMPARISON:  02/08/2020 shoulder radiographs. FINDINGS: Normal alignment with approximation of the joints. No fracture. Glenohumeral and acromioclavicular degenerative changes. Soft tissues are within normal limits. IMPRESSION: No acute osseous abnormality. Glenohumeral and acromioclavicular osteoarthrosis. Electronically Signed   By: Primitivo Gauze M.D.   On: 10/10/2020 14:30    EKG: SR poor R wave progression   ASSESSMENT AND PLAN:  1. Pre-syncope:  No obvious reversible cause. Will check TSH. Echo ordered stat to r/o structural heart disease. Discussed with Dr Quentin Ore EP Transfer to [facility] for likely PPM in am Dr Quentin Ore puts MRI compatible pacers in and post implant may benefit from cardiac MRI to r/o infiltrative disease   2. HtN:  Resume ACE, and norvasc may need to add hydralazine hold diuretic before procedure with mildly increased Cr 1.7  3. DM:  Discussed low carb diet.  Target hemoglobin A1c is 6.5 or less.  Continue current medications.  4. HLD:  Continue statin   Signed: Collier Salina Nishan11/03/2020, 4:19 PM

## 2020-10-10 NOTE — ED Notes (Signed)
Pt ambulates with steady gait and walker to and from restroom without assistance from RN or support staff.

## 2020-10-10 NOTE — ED Triage Notes (Signed)
Pt brought to ED from short stay following syncopal episode. Pt fell in bathroom and hit her back on the toilet. Endorses 5/10 back pain. Pt did not hit head. Sts this has happened in 2019, but does not know the cause. HTN in triage at 174/90. Pt has systolic BP of 762 and BG 277 in short stay. Other VSS. A/Ox4.

## 2020-10-10 NOTE — Progress Notes (Signed)
   10/10/20 1826  Assess: MEWS Score  Temp 98.7 F (37.1 C)  BP (!) 173/83  Pulse Rate (!) 109  ECG Heart Rate (!) 123  Resp 18  Level of Consciousness Alert  SpO2 99 %  O2 Device Room Air  Assess: MEWS Score  MEWS Temp 0  MEWS Systolic 0  MEWS Pulse 2  MEWS RR 0  MEWS LOC 0  MEWS Score 2  MEWS Score Color Yellow  Assess: if the MEWS score is Yellow or Red  Were vital signs taken at a resting state? Yes  Focused Assessment No change from prior assessment (new patient)  Early Detection of Sepsis Score *See Row Information* High  MEWS guidelines implemented *See Row Information* Yes  Treat  MEWS Interventions Other (Comment) (MD aware)  Pain Scale 0-10  Pain Score 0  Take Vital Signs  Increase Vital Sign Frequency  Yellow: Q 2hr X 2 then Q 4hr X 2, if remains yellow, continue Q 4hrs  Escalate  MEWS: Escalate Yellow: discuss with charge nurse/RN and consider discussing with provider and RRT  Notify: Charge Nurse/RN  Name of Charge Nurse/RN Notified Engineer, water RN  Date Charge Nurse/RN Notified 10/10/20  Time Charge Nurse/RN Notified 4580   Pt received from [facility] ED. Alert and oriented x4. Cardiology aware of new admit. Pt has had episodes of asystole today, plan for PPM tomorrow. Zoll pads attached to pt per MD order. Will continue to monitor.  Arletta Bale, RN

## 2020-10-10 NOTE — ED Notes (Signed)
Report given to RN at [facility].   

## 2020-10-10 NOTE — ED Notes (Signed)
Echo at bedside

## 2020-10-10 NOTE — H&P (Signed)
Patient arrived for surgery however was noted by anesthesia to have severe hypertension and passed out in the bathroom. Please refer to their note.  I did not see or evaluate the patient prior to her transfer/ discharge to the emergency room. Her surgery was cancelled/postponed pending medical optimization.   Thereasa Solo, MD

## 2020-10-10 NOTE — ED Provider Notes (Signed)
[facility] DEPT Provider Note   CSN: [id number] Arrival date & time: 10/10/20  1128     History Chief Complaint  Patient presents with  . Fall    Leslie Mullen is a 59 y.o. female.  Patient with history of abnormal uterine bleeding, diabetes, hypertension --presents to the emergency department today from short stay.  Patient was scheduled to have a GYN procedure [facility]) done as an outpatient today.  She has been fasting since the previous evening.  She has not taken any of her medications.  She states that while she was using the restroom and washing her hands she began "hearing a frequency".  She then awoke lying on the floor with soap in her hands.  She was assisted by a bystander.  Patient was evaluated by staff and was found to have elevated blood sugar, elevated blood pressure, negative orthostatics.  Patient had continued dizziness and she agreed to come to the emergency department for evaluation.  Currently she is feeling better.  She has occasional transient episodes of dizziness which last 2 to 3 seconds.  She complains of pain in her right scapula from where she struck the commode when she fell. Patient denies signs of stroke including: facial droop, slurred speech, aphasia, weakness/numbness in extremities, imbalance/trouble walking and currently denies headache.  She reports having a brief spell in 2019 where her "eyes rolled back" but no other similar symptoms.  She denies associated chest pain, shortness of breath, leg swelling.  No history of blood clots or congestive heart failure.        Past Medical History:  Diagnosis Date  . Abnormal uterine bleeding   . Allergy   . Anemia   . Asthma   . Cataract    BOTH EYES  . Diabetes ([place])    type II   . Diabetic neuropathy ([place])   . Endometrial cancer ([place])   . GERD (gastroesophageal reflux disease)   . Hyperlipidemia   . Hypertension   . Morbid obesity ([place])   . Osteoarthritis    right knee    . Pneumonia     Patient Active Problem List   Diagnosis Date Noted  . Type 2 diabetes mellitus with diabetic polyneuropathy, with long-term current use of insulin ([place]) 01/31/2020  . Type 2 diabetes mellitus with hyperglycemia, with long-term current use of insulin ([place]) 01/31/2020  . Type 2 diabetes mellitus with stage 3a chronic kidney disease, with long-term current use of insulin ([place]) 01/31/2020  . Abnormal feces   . Benign neoplasm of ascending colon   . Benign neoplasm of cecum   . Benign neoplasm of transverse colon     Past Surgical History:  Procedure Laterality Date  . CERVICAL BIOPSY    . CESAREAN SECTION     x3  . COLONOSCOPY WITH PROPOFOL N/A 06/29/2017   Procedure: COLONOSCOPY WITH PROPOFOL;  Surgeon: Irene Shipper, MD;  Location: [facility] ENDOSCOPY;  Service: Endoscopy;  Laterality: N/A;  . CYSTECTOMY Right 2010   Cyst removed from right groin.   . TONSILLECTOMY       OB History   No obstetric history on file.     Family History  Adopted: Yes  Problem Relation Age of Onset  . Diabetes Father   . Heart disease Father   . Cancer Mother        colon  . Colon cancer Neg Hx   . Esophageal cancer Neg Hx   . Rectal cancer Neg Hx   .  Stomach cancer Neg Hx     Social History   Tobacco Use  . Smoking status: Former Smoker    Types: Cigarettes  . Smokeless tobacco: Never Used  . Tobacco comment: 18 years ago.  Vaping Use  . Vaping Use: Never used  Substance Use Topics  . Alcohol use: No    Alcohol/week: 0.0 standard drinks  . Drug use: No    Home Medications Prior to Admission medications   Medication Sig Start Date End Date Taking? Authorizing Provider  amLODipine (NORVASC) 10 MG tablet TAKE 1 TABLET(10 MG) BY MOUTH AT BEDTIME Patient taking differently: Take 10 mg by mouth at bedtime.  09/26/20   [name], MD  BIOTIN PO Take by mouth. Patient not taking: Reported on 10/01/2020    [provider]  CINNAMON PO Take 1,000 mg by mouth  2 (two) times daily.  Patient not taking: Reported on 10/01/2020    [provider]  Colloidal Oatmeal 2 % CREA Apply 1 application topically in the morning and at bedtime.    [provider]  cyclobenzaprine (FLEXERIL) 5 MG tablet TAKE 1 TABLET BY MOUTH THREE TIMES DAILY AS NEEDED FOR MUSCLE SPASMS( START EVERY NIGHT AT BEDTIME AS NEEDED DUE TO SEDATION) Patient not taking: Reported on 10/01/2020 05/31/20   [name], MD  ferrous sulfate 325 (65 FE) MG tablet Take 325 mg by mouth 2 (two) times daily with a meal.    [provider]  gabapentin (NEURONTIN) 300 MG capsule TAKE 1 CAPSULE(300 MG) BY MOUTH THREE TIMES DAILY Patient taking differently: Take 300 mg by mouth 3 (three) times daily.  04/02/20   [name], MD  glucose blood (ONE TOUCH ULTRA TEST) test strip  07/24/15   [provider]  ibuprofen (ADVIL) 800 MG tablet Take 1 tablet (800 mg total) by mouth every 8 (eight) hours as needed for moderate pain. For AFTER surgery only 09/23/20   Joylene John D, NP  Insulin Degludec (TRESIBA FLEXTOUCH) 200 UNIT/ML SOPN Inject 70 Units into the muscle every evening. 01/31/20   Shamleffer, Melanie Crazier, MD  Insulin Pen Needle (B-D UF III MINI PEN NEEDLES) 31G X 5 MM MISC  08/01/15   [provider]  JARDIANCE 25 MG TABS tablet TAKE 1 TABLET BY MOUTH DAILY Patient taking differently: Take 25 mg by mouth daily.  07/05/20   [name], MD  lisinopril-hydrochlorothiazide (ZESTORETIC) 20-25 MG tablet TAKE 1 TABLET BY MOUTH DAILY 07/23/20   [name], MD  medroxyPROGESTERone (PROVERA) 10 MG tablet Take 10 mg by mouth daily. 08/26/20   [provider]  metFORMIN (GLUCOPHAGE) 1000 MG tablet TAKE 1 TABLET(1000 MG) BY MOUTH TWICE DAILY WITH A MEAL Patient taking differently: Take 1,000 mg by mouth 2 (two) times daily with a meal.  07/23/20   [name], MD  Misc Natural Products (OSTEO BI-FLEX ADV DOUBLE ST PO) Take 1 tablet  by mouth 2 (two) times daily. Patient not taking: Reported on 10/01/2020    [provider]  [place]. Devices MISC  11/17/13   [provider]  Multiple Vitamins-Minerals (MULTIVITAMIN WITH MINERALS) tablet Take 1 tablet by mouth daily. Patient not taking: Reported on 10/01/2020    [provider]  Omega-3 Fatty Acids (FISH OIL) 1000 MG CAPS Take 1,000 mg by mouth 2 (two) times daily. Patient not taking: Reported on 10/01/2020    [provider]  omeprazole (PRILOSEC) 20 MG capsule TAKE 1 CAPSULE(20 MG) BY MOUTH  AT BEDTIME Patient taking differently: Take 20 mg by mouth at bedtime.  09/26/20   [name], MD  [place] LANCETS 94T MISC  08/01/15   [provider]  OZEMPIC, 0.25 OR 0.5 MG/DOSE, 2 MG/1.5ML SOPN INJECT 0.5 MG INTO THE SKIN ONCE A WEEK 09/12/20   Shamleffer, Melanie Crazier, MD  pravastatin (PRAVACHOL) 40 MG tablet TAKE 1 TABLET(40 MG) BY MOUTH DAILY Patient taking differently: Take 40 mg by mouth daily.  07/23/20   [name], MD  senna-docusate (SENOKOT-S) 8.6-50 MG tablet Take 2 tablets by mouth at bedtime. For AFTER surgery, hold if having loose stools 09/23/20   Cross, Lenna Sciara D, NP  traMADol (ULTRAM) 50 MG tablet TAKE 1 TABLET(50 MG) BY MOUTH TWICE DAILY AS NEEDED Patient taking differently: Take 50 mg by mouth 2 (two) times daily.  09/11/20   [name], MD  traMADol (ULTRAM) 50 MG tablet Take 1 tablet (50 mg total) by mouth every 6 (six) hours as needed for severe pain. For AFTER surgery pain only, do not take and drive 65/46/50   Joylene John D, NP    Allergies    Penicillins  Review of Systems   Review of Systems  Constitutional: Negative for fever.  HENT: Negative for rhinorrhea and sore throat.   Eyes: Negative for redness.  Respiratory: Negative for cough.   Cardiovascular: Negative for chest pain.  Gastrointestinal: Negative for abdominal pain, diarrhea, nausea and vomiting.  Genitourinary:  Negative for dysuria, frequency, hematuria and urgency.  Musculoskeletal: Positive for back pain. Negative for myalgias.  Skin: Negative for rash.  Neurological: Positive for syncope. Negative for seizures and headaches.    Physical Exam Updated Vital Signs BP (!) 193/85 (BP Location: Left Arm)   Pulse (!) 103   Temp 98 F (36.7 C) (Oral)   Resp 14   Ht 5\' 8"  (1.727 m)   Wt (!) 163.7 kg   SpO2 100%   BMI 54.89 kg/m   Physical Exam Vitals and nursing note reviewed.  Constitutional:      Appearance: She is well-developed. She is not diaphoretic.  HENT:     Head: Normocephalic and atraumatic.     Mouth/Throat:     Mouth: Mucous membranes are not dry.  Eyes:     Conjunctiva/sclera: Conjunctivae normal.  Neck:     Vascular: Normal carotid pulses. No JVD.     Trachea: Trachea normal. No tracheal deviation.  Cardiovascular:     Rate and Rhythm: Normal rate and regular rhythm.     Pulses: No decreased pulses.     Heart sounds: Normal heart sounds, S1 normal and S2 normal. No murmur heard.   Pulmonary:     Effort: Pulmonary effort is normal. No respiratory distress.     Breath sounds: No wheezing.  Chest:     Chest wall: No tenderness.  Abdominal:     General: Bowel sounds are normal.     Palpations: Abdomen is soft.     Tenderness: There is no abdominal tenderness. There is no guarding or rebound.  Musculoskeletal:        General: Normal range of motion.     Cervical back: Normal range of motion and neck supple. No muscular tenderness.  Skin:    General: Skin is warm and dry.     Coloration: Skin is not pale.  Neurological:     Mental Status: She is alert.     ED Results / Procedures / Treatments   Labs (all  labs ordered are listed, but only abnormal results are displayed) Labs Reviewed  CBC WITH DIFFERENTIAL/PLATELET - Abnormal; Notable for the following components:      Result Value   WBC 13.0 (*)    Hemoglobin 11.5 (*)    HCT 35.8 (*)    Neutro Abs 10.4  (*)    All other components within normal limits  BASIC METABOLIC PANEL - Abnormal; Notable for the following components:   CO2 21 (*)    Glucose, Bld 343 (*)    BUN 32 (*)    Creatinine, Ser 1.69 (*)    GFR, Estimated 35 (*)    All other components within normal limits  RESPIRATORY PANEL BY RT PCR (FLU A&B, COVID)  MAGNESIUM    EKG ED ECG REPORT   Date: 10/10/2020  Rate: 87  Rhythm: normal sinus rhythm  QRS Axis: normal  Intervals: normal  ST/T Wave abnormalities: normal  Conduction Disutrbances:none  Narrative Interpretation: No prolonged QTC, Brugada syndrome, WPW, hypertrophy, heart blocks or other arrhythmia  Old EKG Reviewed: unchanged  I have personally reviewed the EKG tracing and agree with the computerized printout as noted.  Radiology DG Scapula Right  Result Date: 10/10/2020 CLINICAL DATA:  Fall, pain. EXAM: RIGHT SCAPULA - 2+ VIEWS COMPARISON:  02/08/2020 shoulder radiographs. FINDINGS: Normal alignment with approximation of the joints. No fracture. Glenohumeral and acromioclavicular degenerative changes. Soft tissues are within normal limits. IMPRESSION: No acute osseous abnormality. Glenohumeral and acromioclavicular osteoarthrosis. Electronically Signed   By: Primitivo Gauze M.D.   On: 10/10/2020 14:30    Procedures Procedures (including critical care time)  Medications Ordered in ED Medications - No data to display  ED Course  I have reviewed the triage vital signs and the nursing notes.  Pertinent labs & imaging results that were available during my care of the patient were reviewed by me and considered in my medical decision making (see chart for details).  Patient seen and examined. EKG reviewed. Work-up initiated.   Vital signs reviewed and are as follows: BP (!) 193/85 (BP Location: Left Arm)   Pulse (!) 103   Temp 98 F (36.7 C) (Oral)   Resp 14   Ht 5\' 8"  (1.727 m)   Wt (!) 163.7 kg   SpO2 100%   BMI 54.89 kg/m   Work-up was  reviewed.  I went and discussed this with patient at bedside. She has been up to restroom without difficulty. While talking with her about the results, she states that she continues to have had dizzy spells and when these occur her monitor starts chiming.   I went and reviewed her telemetry.  Patient was noted to have had approximately 15 episodes since arrival where she has P waves but no QRS conduction.  These last as long as 10 seconds.  These were reviewed immediately with Dr. Melina Copa who has assessed patient as well.  I spoke with Dr. Johnsie Cancel of cardiology.  He requests that I have patient admitted to [facility], telemetry, to the cardiology service.  He will see the patient either at [facility] or [facility] later today depending upon her location.  Patient and family updated on results. RN and charge nurse aware.  See picture saved in Epic as an example.   3:42 PM patient with 1 additional episode last 40 minutes.  Signout to oncoming team at shift change who are aware of patient.   CRITICAL CARE Performed by: Carlisle Cater PA-C Total critical care time: 40 minutes Critical  care time was exclusive of separately billable procedures and treating other patients. Critical care was necessary to treat or prevent imminent or life-threatening deterioration. Critical care was time spent personally by me on the following activities: development of treatment plan with patient and/or surrogate as well as nursing, discussions with consultants, evaluation of patient's response to treatment, examination of patient, obtaining history from patient or surrogate, ordering and performing treatments and interventions, ordering and review of laboratory studies, ordering and review of radiographic studies, pulse oximetry and re-evaluation of patient's condition.    Clinical Course as of Oct 11 1539  Thu Oct 10, 3646  5432 59 year old female here from short stay after she passed out prior to procedure.   Work-up has been fairly unremarkable so far with slightly worse creatinine and elevated glucose.  She still experienced some dizzy spells while she was here and was noted on the monitor that she was not conducting any of her P waves for a period of a few seconds.  Will review with cardiology and she will likely need to be admitted for further work-up   [MB]    Clinical Course User Index [MB] Hayden Rasmussen, MD   MDM Rules/Calculators/A&P                           Final Clinical Impression(s) / ED Diagnoses Final diagnoses:  Heart block  Syncope, unspecified syncope type  Hyperglycemia    Rx / DC Orders ED Discharge Orders    None       Carlisle Cater, PA-C 10/10/20 1545    Hayden Rasmussen, MD 10/11/20 1028

## 2020-10-10 NOTE — Progress Notes (Signed)
  Echocardiogram 2D Echocardiogram has been performed.  Leslie Mullen 10/10/2020, 5:53 PM

## 2020-10-10 NOTE — Progress Notes (Signed)
Per Dr. Doroteo Glassman, spoke to Dr. Denman George and decided to cancel case due to syncope, htn, and hyperglycemia.  Recommended for pt to call primary care doctor and/or urgent care/ER for care due to dizziness.

## 2020-10-10 NOTE — Progress Notes (Addendum)
Was the fall witnessed: NO   Patient condition before and after the fall: upon arrival to unit , patient wanted to go to restroom ;  Pt alert and oriented x 4 ; ambulatory with cane and no assistance needed  Patient's reaction to the fall: pt states " I blacked out while I was trying to wash my hands " pt remained alert and oriented x 4 and followed simple commands post syncope. Denies hitting her head ; denies chest pain or SOB  And her only complaint is 5/10 back pain from hitting her back on the toilet seat  Name of the doctor that was notified including date and time: Dr. Elgie Congo @1057   Any interventions and vital signs:  Checked CBG And vital signs in flowsheets ; pt placed in gurney and took patient to ER

## 2020-10-10 NOTE — Progress Notes (Signed)
Called to preoperative area to assess patient after syncopal episode and fall in the bathroom after urinating. Pt fell onto floor, hitting right scapula on toilet. C/o 5/10 pain in right scapula. Per pt, felt dizzy prior to falling.   Orthostatics checked and negative- in fact, pt extremely hypertensive (200/110 sitting, standing and supine). Fingerstick 280. Tenderness to palpation over right scapula but no obvious bleeding or bruising.  D/w Dr. Denman George and decision to reschedule when patient is more medically optimized. Advised patient she should go to emergency department for evaluation and she agrees.

## 2020-10-10 NOTE — ED Notes (Addendum)
Cardiology at bedside.

## 2020-10-10 NOTE — Progress Notes (Signed)
Pt agreeing to go to ER at this time due to continued dizziness.

## 2020-10-10 NOTE — Progress Notes (Signed)
Pt received from [facility] via [place] to [phone]. Oriented x 4. CHG bath complete. Tele box connected, CCMD called. VSS. Call bell in reach. Will continue to monitor.  Arletta Bale, RN

## 2020-10-10 NOTE — ED Notes (Signed)
Pt wheeled to/from restroom

## 2020-10-11 ENCOUNTER — Telehealth: Payer: Self-pay

## 2020-10-11 ENCOUNTER — Encounter (HOSPITAL_COMMUNITY)
Admission: EM | Disposition: A | Discharge status: Home / Self Care | Payer: Self-pay | Source: Ambulatory Visit | Attending: Cardiology

## 2020-10-11 ENCOUNTER — Encounter (HOSPITAL_COMMUNITY): Payer: Self-pay | Admitting: Cardiovascular Disease

## 2020-10-11 DIAGNOSIS — R55 Syncope and collapse: Secondary | ICD-10-CM | POA: Diagnosis not present

## 2020-10-11 DIAGNOSIS — I442 Atrioventricular block, complete: Principal | ICD-10-CM

## 2020-10-11 HISTORY — PX: PACEMAKER IMPLANT: EP1218

## 2020-10-11 LAB — BASIC METABOLIC PANEL
Anion gap: 9 (ref 5–15)
BUN: 27 mg/dL — ABNORMAL HIGH (ref 6–20)
CO2: 23 mmol/L (ref 22–32)
Calcium: 9.1 mg/dL (ref 8.9–10.3)
Chloride: 107 mmol/L (ref 98–111)
Creatinine, Ser: 1.56 mg/dL — ABNORMAL HIGH (ref 0.44–1.00)
GFR, Estimated: 38 mL/min — ABNORMAL LOW (ref >60)
Glucose, Bld: 208 mg/dL — ABNORMAL HIGH (ref 70–99)
Potassium: 4.1 mmol/L (ref 3.5–5.1)
Sodium: 139 mmol/L (ref 135–145)

## 2020-10-11 LAB — ECHOCARDIOGRAM COMPLETE
AR max vel: 2.57 cm2
AV Area VTI: 3.27 cm2
AV Area mean vel: 2.79 cm2
AV Mean grad: 4 mmHg
AV Peak grad: 9.5 mmHg
Ao pk vel: 1.54 m/s
Area-P 1/2: 4.57 cm2
Height: 68 in
S' Lateral: 2.5 cm
Weight: 5776 oz

## 2020-10-11 LAB — CBC
HCT: 31.4 % — ABNORMAL LOW (ref 36.0–46.0)
Hemoglobin: 10 g/dL — ABNORMAL LOW (ref 12.0–15.0)
MCH: 28.1 pg (ref 26.0–34.0)
MCHC: 31.8 g/dL (ref 30.0–36.0)
MCV: 88.2 fL (ref 80.0–100.0)
Platelets: 267 10*3/uL (ref 150–400)
RBC: 3.56 MIL/uL — ABNORMAL LOW (ref 3.87–5.11)
RDW: 13.5 % (ref 11.5–15.5)
WBC: 10.9 10*3/uL — ABNORMAL HIGH (ref 4.0–10.5)
nRBC: 0 % (ref 0.0–0.2)

## 2020-10-11 LAB — SURGICAL PCR SCREEN
MRSA, PCR: NEGATIVE
Staphylococcus aureus: NEGATIVE

## 2020-10-11 LAB — PROTIME-INR
INR: 1.1 (ref 0.8–1.2)
Prothrombin Time: 13.5 seconds (ref 11.4–15.2)

## 2020-10-11 LAB — GLUCOSE, CAPILLARY
Glucose-Capillary: 164 mg/dL — ABNORMAL HIGH (ref 70–99)
Glucose-Capillary: 115 mg/dL — ABNORMAL HIGH (ref 70–99)
Glucose-Capillary: 155 mg/dL — ABNORMAL HIGH (ref 70–99)
Glucose-Capillary: 226 mg/dL — ABNORMAL HIGH (ref 70–99)
Glucose-Capillary: 239 mg/dL — ABNORMAL HIGH (ref 70–99)

## 2020-10-11 LAB — T4, FREE: Free T4: 1.02 ng/dL (ref 0.61–1.12)

## 2020-10-11 LAB — HIV ANTIBODY (ROUTINE TESTING W REFLEX): HIV Screen 4th Generation wRfx: NONREACTIVE

## 2020-10-11 LAB — TSH: TSH: 1.2 u[IU]/mL (ref 0.350–4.500)

## 2020-10-11 SURGERY — PACEMAKER IMPLANT

## 2020-10-11 MED ORDER — MIDAZOLAM HCL 5 MG/5ML IJ SOLN
INTRAMUSCULAR | Status: AC
  Filled 2020-10-11: qty 5

## 2020-10-11 MED ORDER — SODIUM CHLORIDE 0.9 % IV SOLN
INTRAVENOUS | Status: AC
  Filled 2020-10-11: qty 2

## 2020-10-11 MED ORDER — HEPARIN (PORCINE) IN NACL 1000-0.9 UT/500ML-% IV SOLN
INTRAVENOUS | Status: AC
  Filled 2020-10-11: qty 500

## 2020-10-11 MED ORDER — FENTANYL CITRATE (PF) 100 MCG/2ML IJ SOLN
INTRAMUSCULAR | Status: AC
  Filled 2020-10-11: qty 2

## 2020-10-11 MED ORDER — SODIUM CHLORIDE 0.9 % IV SOLN: INTRAVENOUS | Status: DC | PRN

## 2020-10-11 MED ORDER — VANCOMYCIN HCL 1500 MG/300ML IV SOLN: 1500.0000 mg | INTRAVENOUS | Status: DC

## 2020-10-11 MED ORDER — FENTANYL CITRATE (PF) 100 MCG/2ML IJ SOLN
INTRAMUSCULAR | Status: DC | PRN
  Administered 2020-10-11 (×4): 25 ug via INTRAVENOUS

## 2020-10-11 MED ORDER — MIDAZOLAM HCL 5 MG/5ML IJ SOLN
INTRAMUSCULAR | Status: DC | PRN
  Administered 2020-10-11 (×5): 1 mg via INTRAVENOUS

## 2020-10-11 MED ORDER — CHLORHEXIDINE GLUCONATE 4 % EX LIQD
60.0000 mL | Freq: Once | CUTANEOUS | Status: DC
  Administered 2020-10-11: 4 via TOPICAL
  Filled 2020-10-11: qty 60

## 2020-10-11 MED ORDER — VANCOMYCIN HCL 1500 MG/300ML IV SOLN
1500.0000 mg | Freq: Once | INTRAVENOUS | Status: DC
  Administered 2020-10-11: 1500 mg via INTRAVENOUS
  Filled 2020-10-11 (×2): qty 300

## 2020-10-11 MED ORDER — SODIUM CHLORIDE 0.9 % IV SOLN: INTRAVENOUS | Status: DC

## 2020-10-11 MED ORDER — SODIUM CHLORIDE 0.9 % IV SOLN: 80.0000 mg | INTRAVENOUS | Status: DC

## 2020-10-11 MED ORDER — ONDANSETRON HCL 4 MG/2ML IJ SOLN: 4.0000 mg | Freq: Four times a day (QID) | INTRAMUSCULAR | Status: DC | PRN

## 2020-10-11 MED ORDER — LIDOCAINE HCL (PF) 1 % IJ SOLN
INTRAMUSCULAR | Status: AC
  Filled 2020-10-11: qty 60

## 2020-10-11 MED ORDER — LIDOCAINE HCL (PF) 1 % IJ SOLN
INTRAMUSCULAR | Status: DC | PRN
  Administered 2020-10-11: 60 mL

## 2020-10-11 MED ORDER — HEPARIN (PORCINE) IN NACL 1000-0.9 UT/500ML-% IV SOLN
INTRAVENOUS | Status: DC | PRN
  Administered 2020-10-11: 500 mL

## 2020-10-11 SURGICAL SUPPLY — 10 items
CABLE SURGICAL S-101-97-12 (CABLE) ×2 IMPLANT
IPG PACE AZUR XT DR MRI W1DR01 (Pacemaker) ×1 IMPLANT
LEAD CAPSURE NOVUS 5076-52CM (Lead) ×2 IMPLANT
LEAD CAPSURE NOVUS 5076-58CM (Lead) ×2 IMPLANT
MAT PREVALON FULL STRYKER (MISCELLANEOUS) ×2 IMPLANT
PACE AZURE XT DR MRI W1DR01 (Pacemaker) ×2 IMPLANT
PAD PRO RADIOLUCENT 2001M-C (PAD) ×2 IMPLANT
SHEATH 7FR PRELUDE SNAP 13 (SHEATH) ×4 IMPLANT
SHEATH PROBE COVER 6X72 (BAG) ×2 IMPLANT
TRAY PACEMAKER INSERTION (PACKS) ×2 IMPLANT

## 2020-10-11 NOTE — Progress Notes (Signed)
Patient back from cath lab at this time. Telemetry applied. V/s and assessment complete. Left chest pacer site dry and intact with two small areas of blood noted and marked. Will continue to monitor.

## 2020-10-11 NOTE — Progress Notes (Addendum)
0723- Patient with 12.98 second pause on monitor. Patient heart rate returned to 96 on monitor after the pause. Patient states she can "feel" when the pauses come on.  [phone]- Patient continues with several sinus arrests/ pauses on monitor [facility] made aware with cardiology will be up to see patient shortly.  Patients RN remains at bedside for monitoring at this time.  Vital signs obtained.  Rapid Response RN made aware and into see patient. Will monitor patient. Jadrian Bulman, Bettina Gavia RN

## 2020-10-11 NOTE — Significant Event (Addendum)
Rapid Response Event Note   Reason for Call :  Pt admitted for syncope and noted to have heart block. Within the last hour, she has been having more frequent episodes of ventricular standstill. During this time she sustains regular marching P waves, lasting from 6-12.98 seconds.   Initial Focused Assessment:  Pt lying in bed, she is awake and oriented. Skin is warm, dry, pink. She is able to inform staff seconds prior to converting into CHB. She states she hears a "ringing/radio frequency" in her ears right as the ventricular standstill is about to come on and during the event. She only begins to feel faint when they last longer than a few seconds. Otherwise she feels fine, no complaints. No loss of consciousness was witnessed during my encounter. She endorses that just recently she started having these episodes of ringing in her ears/radio frequency accompanied by the occasional sensation of feeling faint, but no loss of consciousness until the event that occurred while she was at [facility] on 10/10/2020.   VS: T 98.98F, BP 157/78, HR 100, RR 19, SpO2 100% on room air CBG: 115  Interventions:  -No intervention from RRRN -EP notified regarding increased frequency of sustained CHB  Plan of Care:  -Plan for PPM implant this morning -Pre-op orders received and initiated  Event Summary:  MD Notified: Dr. Quentin Ore Call Time: [phone] Arrival Time: 8299 End Time: Cool, RN

## 2020-10-11 NOTE — Plan of Care (Signed)
  Problem: Clinical Measurements: Goal: Will remain free from infection Outcome: Progressing   Problem: Activity: Goal: Risk for activity intolerance will decrease Outcome: Progressing   

## 2020-10-11 NOTE — Discharge Instructions (Signed)
After Your Pacemaker   ACTIVITY . Do not lift your arm above shoulder height for 1 week after your procedure. After 7 days, you may progress as below.     Friday October 18, 2020  Saturday October 19, 2020 Sunday October 20, 2020 Monday October 21, 2020   . Do not lift, push, pull, or carry anything over 10 pounds with the affected arm until 6 weeks (Friday November 22, 2020 ) after your procedure.   . Do NOT DRIVE until you have been seen for your wound check, or as long as instructed by your healthcare provider.   . Ask your healthcare provider when you can go back to work   INCISION/Dressing . If you are on a blood thinner such as Coumadin, Xarelto, Eliquis, Plavix, or Pradaxa please confirm with your provider when this should be resumed.   . Monitor your Pacemaker site for redness, swelling, and drainage. Call the device clinic at [phone] if you experience these symptoms or fever/chills.  . If your incision is sealed with Steri-strips or staples, you may shower 10 days after your procedure or when told by your provider. Do not remove the steri-strips or let the shower hit directly on your site. You may wash around your site with soap and water.    Marland Kitchen Avoid lotions, ointments, or perfumes over your incision until it is well-healed.  . You may use a hot tub or a pool AFTER your wound check appointment if the incision is completely closed.  Marland Kitchen PAcemaker Alerts:  Some alerts are vibratory and others beep. These are NOT emergencies. Please call our office to let us know. If this occurs at night or on weekends, it can wait until the next business day. Send a remote transmission.  . If your device is capable of reading fluid status (for heart failure), you will be offered monthly monitoring to review this with you.   DEVICE MANAGEMENT . Remote monitoring is used to monitor your pacemaker from home. This monitoring is scheduled every 91 days by our office. It allows [place] to keep an  eye on the functioning of your device to ensure it is working properly. You will routinely see your Electrophysiologist annually (more often if necessary).   . You should receive your ID card for your new device in 4-8 weeks. Keep this card with you at all times once received. Consider wearing a medical alert bracelet or necklace.  . Your Pacemaker may be MRI compatible. This will be discussed at your next office visit/wound check.  You should avoid contact with strong electric or magnetic fields.    Do not use amateur (ham) radio equipment or electric (arc) welding torches. MP3 player headphones with magnets should not be used. Some devices are safe to use if held at least 12 inches (30 cm) from your Pacemaker. These include power tools, lawn mowers, and speakers. If you are unsure if something is safe to use, ask your health care provider.   When using your cell phone, hold it to the ear that is on the opposite side from the Pacemaker. Do not leave your cell phone in a pocket over the Pacemaker.   You may safely use electric blankets, heating pads, computers, and microwave ovens.  Call the office right away if:  You have chest pain.  You feel more short of breath than you have felt before.  You feel more light-headed than you have felt before.  Your incision starts to open up.  This  information is not intended to replace advice given to you by your health care provider. Make sure you discuss any questions you have with your health care provider.  

## 2020-10-11 NOTE — Progress Notes (Signed)
MD at bedside to discuss POC with patient and patient's spouse. Orders placed at this time.

## 2020-10-11 NOTE — Consult Note (Signed)
ELECTROPHYSIOLOGY CONSULT NOTE    Patient ID: Leslie Mullen MRN: [id number], DOB/AGE: 03-31-1961 59 y.o.  Admit date: 10/10/2020 Date of Consult: 10/11/2020  Primary Physician: [name], MD Primary Cardiologist: No primary care provider on file.  Electrophysiologist: Dr. Quentin Ore  Referring Provider: Dr. Johnsie Cancel  Patient Profile: Leslie Mullen is a 59 y.o. female with a history of GERD, DM2, HTN, Morbid obesity, and endometrial cancer who is being seen today for the evaluation of syncope and intermittent CHB at the request of Dr. Johnsie Cancel.  HPI:  Leslie Mullen is a 59 y.o. female with medical history above.  She presented for [facility] 11/4 and was cancelled due to HTN. She went to the bathroom and had presyncope accompanied by a high frequency sound in her ears, and then 5-9 seconds pauses with CHB on monitoring. She was sent to [facility] and subsequently sent to [facility] for PPM consideration.  Pertinent labs on admisison include K 4.9, Cr 1.7.    No personal history of sarcoid, RA, or other known connective tissue disease. She had distant history of syncope with similar symptoms in 2019, at that time she thought it was due to being over heated.   Tele was mostly stable overnight, but this am she has had 5-11 second pauses with pre or brief syncope. She is awake and alert at the time of our exam but continues to have intermittent, symptomatic HB, with a prodrome of "buzzing" in the ear.   Past Medical History:  Diagnosis Date  . Abnormal uterine bleeding   . Allergy   . Anemia   . Asthma   . Cataract    BOTH EYES  . Diabetes ([place])    type II   . Diabetic neuropathy ([place])   . Endometrial cancer ([place])   . GERD (gastroesophageal reflux disease)   . Hyperlipidemia   . Hypertension   . Morbid obesity ([place])   . Osteoarthritis    right knee   . Pneumonia      Surgical History:  Past Surgical History:  Procedure Laterality Date  . CERVICAL BIOPSY    . CESAREAN SECTION     x3   . COLONOSCOPY WITH PROPOFOL N/A 06/29/2017   Procedure: COLONOSCOPY WITH PROPOFOL;  Surgeon: Irene Shipper, MD;  Location: [facility] ENDOSCOPY;  Service: Endoscopy;  Laterality: N/A;  . CYSTECTOMY Right 2010   Cyst removed from right groin.   . TONSILLECTOMY       Medications Prior to Admission  Medication Sig Dispense Refill Last Dose  . amLODipine (NORVASC) 10 MG tablet TAKE 1 TABLET(10 MG) BY MOUTH AT BEDTIME (Patient taking differently: Take 10 mg by mouth at bedtime. ) 90 tablet 0 10/09/2020 at Unknown time  . Colloidal Oatmeal 2 % CREA Apply 1 application topically in the morning and at bedtime.   Past Week at Unknown time  . ferrous sulfate 325 (65 FE) MG tablet Take 325 mg by mouth 2 (two) times daily with a meal.   10/09/2020 at Unknown time  . gabapentin (NEURONTIN) 300 MG capsule TAKE 1 CAPSULE(300 MG) BY MOUTH THREE TIMES DAILY (Patient taking differently: Take 300 mg by mouth 2 (two) times daily. ) 270 capsule 3 10/09/2020 at Unknown time  . ibuprofen (ADVIL) 800 MG tablet Take 1 tablet (800 mg total) by mouth every 8 (eight) hours as needed for moderate pain. For AFTER surgery only 30 tablet 0 unknown  . Insulin Degludec (TRESIBA FLEXTOUCH) 200 UNIT/ML SOPN Inject 70 Units into the  muscle every evening. (Patient taking differently: Inject 80 Units into the muscle every evening. ) 45 mL 3 10/09/2020 at Unknown time  . JARDIANCE 25 MG TABS tablet TAKE 1 TABLET BY MOUTH DAILY (Patient taking differently: Take 25 mg by mouth daily. ) 90 tablet 1 Past Week at Unknown time  . lisinopril-hydrochlorothiazide (ZESTORETIC) 20-25 MG tablet TAKE 1 TABLET BY MOUTH DAILY 90 tablet 0 10/09/2020 at Unknown time  . medroxyPROGESTERone (PROVERA) 10 MG tablet Take 10 mg by mouth daily.   Past Week at Unknown time  . metFORMIN (GLUCOPHAGE) 1000 MG tablet TAKE 1 TABLET(1000 MG) BY MOUTH TWICE DAILY WITH A MEAL (Patient taking differently: Take 1,000 mg by mouth 2 (two) times daily with a meal. ) 180 tablet 0  10/09/2020 at Unknown time  . omeprazole (PRILOSEC) 20 MG capsule TAKE 1 CAPSULE(20 MG) BY MOUTH AT BEDTIME (Patient taking differently: Take 20 mg by mouth at bedtime. ) 90 capsule 0 10/09/2020 at Unknown time  . OZEMPIC, 0.25 OR 0.5 MG/DOSE, 2 MG/1.5ML SOPN INJECT 0.5 MG INTO THE SKIN ONCE A WEEK 4.5 mL 0 10/05/2020 at unknown time  . pravastatin (PRAVACHOL) 40 MG tablet TAKE 1 TABLET(40 MG) BY MOUTH DAILY (Patient taking differently: Take 40 mg by mouth every evening. ) 90 tablet 1 10/09/2020 at Unknown time  . senna-docusate (SENOKOT-S) 8.6-50 MG tablet Take 2 tablets by mouth at bedtime. For AFTER surgery, hold if having loose stools 30 tablet 1 unknown  . traMADol (ULTRAM) 50 MG tablet TAKE 1 TABLET(50 MG) BY MOUTH TWICE DAILY AS NEEDED (Patient taking differently: Take 50 mg by mouth 2 (two) times daily. ) 60 tablet 0 10/09/2020 at Unknown time  . traMADol (ULTRAM) 50 MG tablet Take 1 tablet (50 mg total) by mouth every 6 (six) hours as needed for severe pain. For AFTER surgery pain only, do not take and drive 10 tablet 0 unknown  . cyclobenzaprine (FLEXERIL) 5 MG tablet TAKE 1 TABLET BY MOUTH THREE TIMES DAILY AS NEEDED FOR MUSCLE SPASMS( START EVERY NIGHT AT BEDTIME AS NEEDED DUE TO SEDATION) (Patient not taking: Reported on 10/01/2020) 15 tablet o Not Taking at Unknown time  . glucose blood (ONE TOUCH ULTRA TEST) test strip      . Insulin Pen Needle (B-D UF III MINI PEN NEEDLES) 31G X 5 MM MISC      . [place] LANCETS 78G MISC        Inpatient Medications:  . amLODipine  10 mg Oral Daily  . chlorhexidine  60 mL Topical Once  . dextrose  12.5 g Intravenous STAT  . empagliflozin  25 mg Oral Daily  . ferrous sulfate  325 mg Oral BID WC  . gabapentin  300 mg Oral BID  . gentamicin irrigation  80 mg Irrigation To SSTC  . insulin aspart  0-24 Units Subcutaneous Q4H  . insulin glargine  40 Units Subcutaneous QPM  . lisinopril  20 mg Oral Daily  . pantoprazole  40 mg Oral QHS  .  pravastatin  40 mg Oral QPM  . senna-docusate  2 tablet Oral QHS    Allergies:  Allergies  Allergen Reactions  . Penicillins Shortness Of Breath, Itching and Swelling    Has patient had a PCN reaction causing immediate rash, facial/tongue/throat swelling, SOB or lightheadedness with hypotension: Yes Has patient had a PCN reaction causing severe rash involving mucus membranes or skin necrosis:No Has patient had a PCN reaction that required hospitalization: No Has patient had a PCN  reaction occurring within the last 10 years: no If all of the above answers are "NO", then may proceed with Cephalosporin use.      Social History   Socioeconomic History  . Marital status: Married    Spouse name: Not on file  . Number of children: 2  . Years of education: Not on file  . Highest education level: Not on file  Occupational History  . Occupation: Intake Coordinator  Tobacco Use  . Smoking status: Former Smoker    Types: Cigarettes  . Smokeless tobacco: Never Used  . Tobacco comment: 18 years ago.  Vaping Use  . Vaping Use: Never used  Substance and Sexual Activity  . Alcohol use: No    Alcohol/week: 0.0 standard drinks  . Drug use: No  . Sexual activity: Not on file  Other Topics Concern  . Not on file  Social History Narrative  . Not on file   Social Determinants of Health   Financial Resource Strain:   . Difficulty of Paying Living Expenses: Not on file  Food Insecurity:   . Worried About Charity fundraiser in the Last Year: Not on file  . Ran Out of Food in the Last Year: Not on file  Transportation Needs:   . Lack of Transportation (Medical): Not on file  . Lack of Transportation (Non-Medical): Not on file  Physical Activity:   . Days of Exercise per Week: Not on file  . Minutes of Exercise per Session: Not on file  Stress:   . Feeling of Stress : Not on file  Social Connections:   . Frequency of Communication with Friends and Family: Not on file  . Frequency of  Social Gatherings with Friends and Family: Not on file  . Attends Religious Services: Not on file  . Active Member of Clubs or Organizations: Not on file  . Attends Archivist Meetings: Not on file  . Marital Status: Not on file  Intimate Partner Violence:   . Fear of Current or Ex-Partner: Not on file  . Emotionally Abused: Not on file  . Physically Abused: Not on file  . Sexually Abused: Not on file     Family History  Adopted: Yes  Problem Relation Age of Onset  . Diabetes Father   . Heart disease Father   . Cancer Mother        colon  . Colon cancer Neg Hx   . Esophageal cancer Neg Hx   . Rectal cancer Neg Hx   . Stomach cancer Neg Hx      Review of Systems: All other systems reviewed and are otherwise negative except as noted above.  Physical Exam: Vitals:   10/10/20 2045 10/10/20 2317 10/11/20 0412 10/11/20 0748  BP: (!) 145/72 (!) 144/87 (!) 147/76 (!) 157/78  Pulse: 97 90 82   Resp: 16 18 15  (!) 23  Temp: 98.3 F (36.8 C) 98 F (36.7 C) 98.5 F (36.9 C) 98.1 F (36.7 C)  TempSrc: Oral Oral Oral Oral  SpO2: 100% 99% 99% 100%  Weight:      Height:        GEN- The patient is well appearing, alert and oriented x 3 today.   HEENT: normocephalic, atraumatic; sclera clear, conjunctiva pink; hearing intact; oropharynx clear; neck supple Lungs- Clear to ausculation bilaterally, normal work of breathing.  No wheezes, rales, rhonchi Heart- Regular rate and rhythm, no murmurs, rubs or gallops GI- soft, non-tender, non-distended, bowel sounds present Extremities- no  clubbing, cyanosis, or edema; DP/PT/radial pulses 2+ bilaterally MS- no significant deformity or atrophy Skin- warm and dry, no rash or lesion Psych- euthymic mood, full affect Neuro- strength and sensation are intact  Labs:   Lab Results  Component Value Date   WBC 10.9 (H) 10/11/2020   HGB 10.0 (L) 10/11/2020   HCT 31.4 (L) 10/11/2020   MCV 88.2 10/11/2020   PLT 267 10/11/2020      Recent Labs  Lab 10/11/20 0225  NA 139  K 4.1  CL 107  CO2 23  BUN 27*  CREATININE 1.56*  CALCIUM 9.1  GLUCOSE 208*      Radiology/Studies: DG Scapula Right  Result Date: 10/10/2020 CLINICAL DATA:  [place], pain. EXAM: RIGHT SCAPULA - 2+ VIEWS COMPARISON:  02/08/2020 shoulder radiographs. FINDINGS: Normal alignment with approximation of the joints. No fracture. Glenohumeral and acromioclavicular degenerative changes. Soft tissues are within normal limits. IMPRESSION: No acute osseous abnormality. Glenohumeral and acromioclavicular osteoarthrosis. Electronically Signed   By: Primitivo Gauze M.D.   On: 10/10/2020 14:30    EKG: Baseline EKG 10/03/2020 shows NSR at 84 bpm, PR interval 176 ms, QRS 76 ms  (personally reviewed)  TELEMETRY: NSR 70-90s but with 5 to 11 second intermittent, symptomatic CHB (personally reviewed)  Assessment/Plan: 1.  Syncope with intermittent CHB Not on AV nodal blockades No obvious reversible cause.  ? Underlying infiltrated cardiomyopathy. Had planned to try and get cMRI this am, but she continues to have long symptomatic pauses, so will have to attempt once healed from PPM.  Echo pending.  Explained risks, benefits, and alternatives to PPM implantation, including but not limited to bleeding, infection, pneumothorax, pericardial effusion, lead dislodgement, heart attack, stroke, or death.  Pt verbalized understanding and agrees to proceed.  With stable prodrome of buzzing in ear, will ask neurology to see for any input they may be able to provide  2. Endometrial cancer Will need to reschedule D&C once healed from PPM.   3. HTN Continue ACE and amlodipine  4. DM II Close follow up with PCP  Dr. Quentin Ore has seen. She will get urgent PPM implantation this morning with ongoing pauses and syncope/near syncope. She is tolerated well overall currently.   For questions or updates, please contact [place] Please consult [email or web address] for contact  info under Cardiology/STEMI.  Jacalyn Lefevre, PA-C  10/11/2020 8:43 AM

## 2020-10-11 NOTE — Plan of Care (Signed)
Continue to monitor

## 2020-10-11 NOTE — Plan of Care (Signed)
  Problem: Clinical Measurements: Goal: Will remain free from infection Outcome: Progressing Goal: Diagnostic test results will improve Outcome: Progressing   

## 2020-10-11 NOTE — Telephone Encounter (Signed)
LM for Leslie Mullen to call the office to discuss how she is doing after her surgery yesterday.

## 2020-10-12 ENCOUNTER — Inpatient Hospital Stay (HOSPITAL_COMMUNITY): Payer: Managed Care, Other (non HMO)

## 2020-10-12 ENCOUNTER — Encounter (HOSPITAL_COMMUNITY): Payer: Self-pay | Admitting: Cardiovascular Disease

## 2020-10-12 DIAGNOSIS — R55 Syncope and collapse: Secondary | ICD-10-CM | POA: Diagnosis not present

## 2020-10-12 DIAGNOSIS — I442 Atrioventricular block, complete: Secondary | ICD-10-CM | POA: Diagnosis not present

## 2020-10-12 LAB — GLUCOSE, CAPILLARY
Glucose-Capillary: 191 mg/dL — ABNORMAL HIGH (ref 70–99)
Glucose-Capillary: 213 mg/dL — ABNORMAL HIGH (ref 70–99)
Glucose-Capillary: 230 mg/dL — ABNORMAL HIGH (ref 70–99)
Glucose-Capillary: 292 mg/dL — ABNORMAL HIGH (ref 70–99)

## 2020-10-12 MED ORDER — TRESIBA FLEXTOUCH 200 UNIT/ML ~~LOC~~ SOPN: 80.0000 [IU] | PEN_INJECTOR | Freq: Every evening | SUBCUTANEOUS | Status: DC

## 2020-10-12 MED ORDER — LISINOPRIL 20 MG PO TABS: 20.0000 mg | ORAL_TABLET | Freq: Every day | ORAL | 1 refills | Status: DC

## 2020-10-12 MED ORDER — GABAPENTIN 300 MG PO CAPS: 300.0000 mg | ORAL_CAPSULE | Freq: Two times a day (BID) | ORAL | Status: DC

## 2020-10-12 NOTE — Progress Notes (Signed)
10 beat PVCs. Patient asymptomatic

## 2020-10-12 NOTE — Progress Notes (Signed)
Patient had 4 episodes of 8 beat run PVCs. Patient asymptomatic. notified on call cardiology. Waiting for call back. Will continue to monitor

## 2020-10-12 NOTE — Discharge Summary (Addendum)
ELECTROPHYSIOLOGY PROCEDURE DISCHARGE SUMMARY    Patient ID: Leslie Mullen,  MRN: [id number], DOB/AGE: 1961-09-17 59 y.o.  Admit date: 10/10/2020 Discharge date: 10/12/2020  Primary Care Physician: [name], MD  Primary Cardiologist: Jenkins Rouge, MD  Electrophysiologist: Vickie Epley, MD   Primary Discharge Diagnosis:  Symptomatic bradycardia/complete heart block status post pacemaker implantation this admission  Secondary Discharge Diagnosis:  Diabetes mellitus, insulin dependent Morbid obesity Renal insufficiency (suspected AKI superimposed on CKD stage III per review of chart) Anemia, unspecified  Allergies  Allergen Reactions   Penicillins Shortness Of Breath, Itching and Swelling    Has patient had a PCN reaction causing immediate rash, facial/tongue/throat swelling, SOB or lightheadedness with hypotension: Yes Has patient had a PCN reaction causing severe rash involving mucus membranes or skin necrosis:No Has patient had a PCN reaction that required hospitalization: No Has patient had a PCN reaction occurring within the last 10 years: no If all of the above answers are "NO", then may proceed with Cephalosporin use.       Procedures This Admission:   2D echo 10/10/20  1. Left ventricular ejection fraction, by estimation, is 65 to 70%. The  left ventricle has normal function. The left ventricle has no regional  wall motion abnormalities. There is moderate concentric left ventricular  hypertrophy. Left ventricular  diastolic parameters are consistent with Grade I diastolic dysfunction  (impaired relaxation). Elevated left atrial pressure.   2. Right ventricular systolic function is normal. The right ventricular  size is normal. There is normal pulmonary artery systolic pressure.   3. The mitral valve is normal in structure. No evidence of mitral valve  regurgitation. No evidence of mitral stenosis.   4. The aortic valve is normal in structure. Aortic  valve regurgitation is  not visualized. No aortic stenosis is present.   5. The inferior vena cava is normal in size with greater than 50%  respiratory variability, suggesting right atrial pressure of 3 mmHg.   10/11/20 PPM insertion SURGEON:  Lars Mage, MD      PREPROCEDURE DIAGNOSES:   1. Paroxysmal complete heart block     POSTPROCEDURE DIAGNOSES:   1. Paroxysmal complete heart block     PROCEDURES:    1. Dual chamber permanent pacemaker implantation       INTRODUCTION: Leslie Mullen is a 59 y.o. female with paroxysmal complete heart block who presents to the EP lab for dual chamber permanent pacemaker implantation. The ejection fraction is normal.     DESCRIPTION OF PROCEDURE:  Informed written consent was obtained and the patient was brought to the electrophysiology lab in the fasting state. The patient was adequately sedated with intravenous Versed, and fentanyl as outlined in the nursing report.  The patient's left chest was prepped and draped in the usual sterile fashion by the EP lab staff.  The skin overlying the left deltopectoral region was infiltrated with lidocaine for local analgesia.  An incision was created over the left deltopectoral region.  A left subcutaneous pocket was fashioned using a combination of sharp and blunt dissection immediately superficial to the pectoral fascia.  Electrocautery was used to assure hemostasis.   Lead Placement: The left axillary vein was cannulated using ultrasound guidance.  Through the left axillary vein, an RV pace/sense lead was advanced to the RV mid septum (model [id number], serial [id number]) where it displayed excelling pacing (1 V at 0.9ms) and good sensing thresholds (4.9 mV) with an acceptable impedance (1330 ohms). Extensive mapping was performed in  the RV to find a suitable lead location. The final RV lead location was where contact was best with a good injury current and stable sensing was observed between subsequent beats. The lead  was secured to the pectoral fascia. Next, the right atrial lead (model [id number], serial [id number] was advanced to the RA appendage where it displayed excellent pacing (1.25 V at 0.36ms) and sensing (2.4 mV) thresholds with an acceptable impedance (399 ohms). It was secured to the pectoral fascia. The pocket was irrigated with copious vancomycin solution.  The leads were then  connected to a pulse generator (model [id number], serial [id number]). The pocket was closed in layers of absorbable suture. EBL < 68mL. Steri-strips and a sterile dressing were applied. The patient's heart rate, blood pressure, and oxygen saturation are monitored continuously during the procedure.      CONCLUSIONS:   1. Paroxysmal complete heart block  2. Successful dual chamber permanent pacemaker implantation  3.  No early apparent complications.    Lysbeth Galas T. Quentin Ore, MD, [facility] Cardiac Electrophysiology  Estimated blood loss <50 mL.   During this procedure medications were administered to achieve and maintain moderate conscious sedation while the patient's heart rate, blood pressure, and oxygen saturation were continuously monitored and I was present face-to-face 100% of this time.   CXR 10/12/20 IMPRESSION: No active cardiopulmonary abnormalities.   Left chest wall pacer device noted with leads in the right atrial appendage and right ventricle. No pneumothorax identified.      Brief HPI: Leslie Mullen is a 59 y.o. female with history of abnormal uterine bleeding, anemia, asthma, cataracts, endometrial cancer, DM w/ neuropathy, GERD, HTN, HLD, morbid obesity, osteoarthritis, chronic appearing renal insufficiency. She presented for [facility] 11/4 and was cancelled due to HTN. She went to the bathroom and had presyncope accompanied by a high frequency sound in her ears, and then 5-9 seconds pauses with complete heart block on monitoring. She was sent to [facility] and subsequently sent to [facility] for PPM consideration. She denied any  personal history of sarcoid, RA, or other known connective tissue disease. Her creatinine was noted to be 1.69, previously 1.42. 2D echo 10/10/20 showed EF 65-70%, grade 1 DD. Upon EP evaluation she was felt to have symptomatic bradycardia without reversible causes identified.  Risks, benefits, and alternatives to PPM implantation were reviewed with the patient who wished to proceed.  Hospital Course:  The patient underwent implantation of a Medtronic dual chamber pacemaker. She was monitored on telemetry overnight which demonstrated NSR with ventricular pacing.  Left chest was without hematoma or ecchymosis.  The device was interrogated and found to be functioning normally.  CXR was obtained and demonstrated no pneumothorax status post device implantation.  Wound care, arm mobility, and restrictions were reviewed with the patient.  The patient was examined and considered stable for discharge to home.  Usual f/u has been arranged with wound check and EP f/u. Standard EP team has outlined instructions on AVS. She does not work outside the home so work note not needed. Dr. Lovena Le has seen and examined the patient today and feels she is stable for discharge.  Regarding discharge meds: - Dr. Lovena Le recommended holding metformin for 48 hours post-procedure as a precaution, to resume 11/8 - On pre-admission medicines she was on lisinopril/HCTZ which was held due to her suspected AKI on CKD stage III. He recommends she resume lisinopril only at this time. She was advised to f/u with PCP within 1 week to monitor kidney function and blood count (  labwork showed mild anemia which patient has history of) - On discharge meds she originally had pre-procedure OB GYN meds of ibuprofen/breakthrough Tramadol called in in anticipation of her original D&C - I left these on her medicine list in case her procedure gets rescheduled so that the OBGYN team can see that they were already sent in. I verbally clarified with the patient  that these meds are listed for post OBGYN procedure, not necessarily pacemaker. (She also takes chronic Tramadol already for unrelated causes.) She indicates OB GYN had tried to call her to discuss next steps but she had not yet been able to speak with them. - Cyclobenzaprine was removed since she indicated she is longer taking this. Med list was also updated to reflect how she is presently taking her insulin and gabapentin. She had been taking insulin 80 units daily at home prior to admission (previously prescribed as 70 units but had been doing 80 units which she states as was at the direction of PCP - she was getting 1/2 dose here in the hospital due to need for procedures). She had been taking gabapentin 2x daily instead of 3x daily recently. She was advised to touch base with the prescriber of these medicines so that they can update her prescriptions on file at her pharmacy.   Physical Exam: Vitals:   10/12/20 0025 10/12/20 0421 10/12/20 0824 10/12/20 0913  BP: 137/64 (!) 157/76 (!) 133/55 139/79  Pulse: 83 83 88   Resp: 16 15 14    Temp: 98.2 F (36.8 C) 98.4 F (36.9 C) 98.3 F (36.8 C)   TempSrc: Oral Oral Oral   SpO2: 99% 99% 99%   Weight:      Height:       See rounding note for exam.  Labs:   Lab Results  Component Value Date   WBC 10.9 (H) 10/11/2020   HGB 10.0 (L) 10/11/2020   HCT 31.4 (L) 10/11/2020   MCV 88.2 10/11/2020   PLT 267 10/11/2020    Recent Labs  Lab 10/11/20 0225  NA 139  K 4.1  CL 107  CO2 23  BUN 27*  CREATININE 1.56*  CALCIUM 9.1  GLUCOSE 208*    Discharge Medications:  Allergies as of 10/12/2020       Reactions   Penicillins Shortness Of Breath, Itching, Swelling   Has patient had a PCN reaction causing immediate rash, facial/tongue/throat swelling, SOB or lightheadedness with hypotension: Yes Has patient had a PCN reaction causing severe rash involving mucus membranes or skin necrosis:No Has patient had a PCN reaction that required  hospitalization: No Has patient had a PCN reaction occurring within the last 10 years: no If all of the above answers are "NO", then may proceed with Cephalosporin use.          Medication List     STOP taking these medications    cyclobenzaprine 5 MG tablet Commonly known as: FLEXERIL   lisinopril-hydrochlorothiazide 20-25 MG tablet Commonly known as: ZESTORETIC       TAKE these medications    amLODipine 10 MG tablet Commonly known as: NORVASC TAKE 1 TABLET(10 MG) BY MOUTH AT BEDTIME What changed: See the new instructions.   B-D UF III MINI PEN NEEDLES 31G X 5 MM Misc Generic drug: Insulin Pen Needle   Colloidal Oatmeal 2 % Crea Apply 1 application topically in the morning and at bedtime.   ferrous sulfate 325 (65 FE) MG tablet Take 325 mg by mouth 2 (two) times daily with  a meal.   gabapentin 300 MG capsule Commonly known as: NEURONTIN Take 1 capsule (300 mg total) by mouth 2 (two) times daily. What changed: See the new instructions.   ibuprofen 800 MG tablet Commonly known as: ADVIL Take 1 tablet (800 mg total) by mouth every 8 (eight) hours as needed for moderate pain. For AFTER surgery only   Jardiance 25 MG Tabs tablet Generic drug: empagliflozin TAKE 1 TABLET BY MOUTH DAILY What changed: how much to take   lisinopril 20 MG tablet Commonly known as: ZESTRIL Take 1 tablet (20 mg total) by mouth daily. Start taking on: October 13, 2020   medroxyPROGESTERone 10 MG tablet Commonly known as: PROVERA Take 10 mg by mouth daily.   metFORMIN 1000 MG tablet Commonly known as: GLUCOPHAGE TAKE 1 TABLET(1000 MG) BY MOUTH TWICE DAILY WITH A MEAL What changed: See the new instructions. Notes to patient: Please stop your metformin for 48 hours after your procedure. You can restart this the morning of 10/14/20.   omeprazole 20 MG capsule Commonly known as: PRILOSEC TAKE 1 CAPSULE(20 MG) BY MOUTH AT BEDTIME What changed: See the new instructions.   ONE TOUCH  ULTRA TEST test strip Generic drug: glucose blood   OneTouch Delica Lancets 40J Misc   Ozempic (0.25 or 0.5 MG/DOSE) 2 MG/1.5ML Sopn Generic drug: Semaglutide(0.25 or 0.5MG /DOS) INJECT 0.5 MG INTO THE SKIN ONCE A WEEK   pravastatin 40 MG tablet Commonly known as: PRAVACHOL TAKE 1 TABLET(40 MG) BY MOUTH DAILY What changed: See the new instructions.   senna-docusate 8.6-50 MG tablet Commonly known as: Senokot-S Take 2 tablets by mouth at bedtime. For AFTER surgery, hold if having loose stools   traMADol 50 MG tablet Commonly known as: ULTRAM TAKE 1 TABLET(50 MG) BY MOUTH TWICE DAILY AS NEEDED What changed: See the new instructions.   traMADol 50 MG tablet Commonly known as: ULTRAM Take 1 tablet (50 mg total) by mouth every 6 (six) hours as needed for severe pain. For AFTER surgery pain only, do not take and drive What changed: Another medication with the same name was changed. Make sure you understand how and when to take each.   Tyler Aas FlexTouch 200 UNIT/ML FlexTouch Pen Generic drug: insulin degludec Inject 80 Units into the skin every evening. What changed:  how much to take how to take this               Discharge Care Instructions  (From admission, onward)           Start     Ordered   10/12/20 0000  Discharge wound care:       Comments: Please see attached sheet at the end of your After-Visit Summary for instructions on wound care, activity, and bathing.   10/12/20 1027            Disposition:  Discharge Instructions     Diet - low sodium heart healthy   Complete by: As directed    Discharge instructions   Complete by: As directed    Please stop your metformin for 48 hours after your procedure. You can restart this the morning of 10/14/20.  Your kidney function looked slightly more impaired this admission. Your lisinopril/hydrochlorothiazide combination pill was changed to lisinopril only. Please monitor your blood pressure occasionally at  home. Call your doctor if you tend to get readings of greater than 130 on the top number or 80 on the bottom number. Please call your primary care provider to be seen  within 1 week to recheck this as well as your blood count.  Your medication list was updated to reflect how you are currently taking your gabapentin and insulin. You should make sure to touch base with the prescriber of these medicines so that they can update your prescriptions on file at your pharmacy.  Your cyclobenzaprine was removed from your list since you indicated you are no longer taking this.  On your medicine list, you originally had pre-procedure OB GYN meds of ibuprofen/breakthrough Tramadol called in in anticipation of your D&C procedure that was ultimately cancelled. We left these on your medicine list for when your D&C procedure gets rescheduled so that the OBGYN team can see that they were already sent in.   Discharge wound care:   Complete by: As directed    Please see attached sheet at the end of your After-Visit Summary for instructions on wound care, activity, and bathing.   Increase activity slowly   Complete by: As directed    Please see attached sheet at the end of your After-Visit Summary for instructions on wound care, activity, and bathing.       Follow-up Information     Vickie Epley, MD Follow up on 01/21/2021.   Specialties: Cardiology, Radiology Why: at 330 for 3 month post pacemaker check Contact information: [place] [address] [address] [place] Follow up on 10/22/2020.   Why: at 3:20 for post pacemaker wound check - [address]/[place] location Contact information: [place] [address] [phone]        [name], MD Follow up.   Specialties: Family Medicine, Sports Medicine Why: Your bloodwork showed mild anemia (decreased  hemoglobin/blood count) and slight elevation of your kidney function compared to your baseline. Please call to schedule early post-hospital follow-up with primary care to follow this. Contact information: [place] [address] [phone]                 Duration of Discharge Encounter: Greater than 30 minutes including physician time.  Signed, Charlie Pitter, PA-C  10/12/2020 10:45 AM  EP Attending  Patient seen and examined. Agree with above. See my note as well. She is stable for DC and will undergo usual followup.  Carleene Overlie Kordelia Severin,MD

## 2020-10-12 NOTE — Progress Notes (Addendum)
Progress Note  Patient Name: Leslie Mullen Date of Encounter: 10/12/2020  Primary Cardiologist: No primary care provider on file.   Subjective   No chest pain or sob  Inpatient Medications    Scheduled Meds: . amLODipine  10 mg Oral Daily  . empagliflozin  25 mg Oral Daily  . ferrous sulfate  325 mg Oral BID WC  . gabapentin  300 mg Oral BID  . insulin aspart  0-24 Units Subcutaneous Q4H  . insulin glargine  40 Units Subcutaneous QPM  . lisinopril  20 mg Oral Daily  . pantoprazole  40 mg Oral QHS  . pravastatin  40 mg Oral QPM  . senna-docusate  2 tablet Oral QHS   Continuous Infusions:  PRN Meds: acetaminophen, ondansetron (ZOFRAN) IV, traMADol   Vital Signs    Vitals:   10/11/20 2016 10/12/20 0025 10/12/20 0421 10/12/20 0824  BP: 132/68 137/64 (!) 157/76 (!) 133/55  Pulse: 86 83 83 88  Resp: 17 16 15 14   Temp: 98.4 F (36.9 C) 98.2 F (36.8 C) 98.4 F (36.9 C) 98.3 F (36.8 C)  TempSrc: Oral Oral Oral Oral  SpO2: 97% 99% 99% 99%  Weight:      Height:        Intake/Output Summary (Last 24 hours) at 10/12/2020 0834 Last data filed at 10/11/2020 1900 Gross per 24 hour  Intake 430.25 ml  Output 0 ml  Net 430.25 ml   Filed Weights   10/10/20 1157 10/10/20 1821  Weight: (!) 163.7 kg (!) 162.9 kg    Telemetry    nsr with ventricular pacing - Personally Reviewed  ECG    none - Personally Reviewed  Physical Exam   GEN: No acute distress.   Neck: No JVD Cardiac: RRR, no murmurs, rubs, or gallops.  Respiratory: Clear to auscultation bilaterally. No hematoma GI: Soft, nontender, non-distended  MS: No edema; No deformity. Neuro:  Nonfocal  Psych: Normal affect   Labs    Chemistry Recent Labs  Lab 10/10/20 1220 10/11/20 0225  NA 136 139  K 4.9 4.1  CL 104 107  CO2 21* 23  GLUCOSE 343* 208*  BUN 32* 27*  CREATININE 1.69* 1.56*  CALCIUM 9.1 9.1  GFRNONAA 35* 38*  ANIONGAP 11 9     Hematology Recent Labs  Lab 10/10/20 1220  10/11/20 0225  WBC 13.0* 10.9*  RBC 4.07 3.56*  HGB 11.5* 10.0*  HCT 35.8* 31.4*  MCV 88.0 88.2  MCH 28.3 28.1  MCHC 32.1 31.8  RDW 13.4 13.5  PLT 271 267    Cardiac EnzymesNo results for input(s): TROPONINI in the last 168 hours. No results for input(s): TROPIPOC in the last 168 hours.   BNPNo results for input(s): BNP, PROBNP in the last 168 hours.   DDimer No results for input(s): DDIMER in the last 168 hours.   Radiology    DG Chest 2 View  Result Date: 10/12/2020 CLINICAL DATA:  Evaluate pacemaker placement. EXAM: CHEST - 2 VIEW COMPARISON:  None FINDINGS: Left chest wall pacer device is noted with leads in the right atrial appendage and right ventricle. No pneumothorax identified. Heart size is normal. No pleural effusion, interstitial edema or airspace densities. IMPRESSION: No active cardiopulmonary abnormalities. Left chest wall pacer device noted with leads in the right atrial appendage and right ventricle. No pneumothorax identified. Electronically Signed   By: Kerby Moors M.D.   On: 10/12/2020 08:17   DG Scapula Right  Result Date: 10/10/2020 CLINICAL DATA:  Fall, pain. EXAM: RIGHT SCAPULA - 2+ VIEWS COMPARISON:  02/08/2020 shoulder radiographs. FINDINGS: Normal alignment with approximation of the joints. No fracture. Glenohumeral and acromioclavicular degenerative changes. Soft tissues are within normal limits. IMPRESSION: No acute osseous abnormality. Glenohumeral and acromioclavicular osteoarthrosis. Electronically Signed   By: Primitivo Gauze M.D.   On: 10/10/2020 14:30   ECHOCARDIOGRAM COMPLETE  Result Date: 10/11/2020    ECHOCARDIOGRAM REPORT   Patient Name:   Leslie Mullen Date of Exam: 10/10/2020 Medical Rec #:  [id number]    Height:       68.0 in Accession #:    [id number]   Weight:       361.0 lb Date of Birth:  27-Jan-1961    BSA:          2.627 m Patient Age:    59 years     BP:           175/76 mmHg Patient Gender: F            HR:           94 bpm. Exam  Location:  Inpatient Procedure: 2D Echo, Cardiac Doppler, Color Doppler and Intracardiac            Opacification Agent Indications:    Syncope  History:        Patient has no prior history of Echocardiogram examinations.                 Risk Factors:Hypertension, Diabetes and Former Smoker.  Sonographer:    Clayton Lefort RDCS (AE) Referring Phys: [place]  1. Left ventricular ejection fraction, by estimation, is 65 to 70%. The left ventricle has normal function. The left ventricle has no regional wall motion abnormalities. There is moderate concentric left ventricular hypertrophy. Left ventricular diastolic parameters are consistent with Grade I diastolic dysfunction (impaired relaxation). Elevated left atrial pressure.  2. Right ventricular systolic function is normal. The right ventricular size is normal. There is normal pulmonary artery systolic pressure.  3. The mitral valve is normal in structure. No evidence of mitral valve regurgitation. No evidence of mitral stenosis.  4. The aortic valve is normal in structure. Aortic valve regurgitation is not visualized. No aortic stenosis is present.  5. The inferior vena cava is normal in size with greater than 50% respiratory variability, suggesting right atrial pressure of 3 mmHg. FINDINGS  Left Ventricle: Left ventricular ejection fraction, by estimation, is 65 to 70%. The left ventricle has normal function. The left ventricle has no regional wall motion abnormalities. Definity contrast agent was given IV to delineate the left ventricular  endocardial borders. The left ventricular internal cavity size was normal in size. There is moderate concentric left ventricular hypertrophy. Left ventricular diastolic parameters are consistent with Grade I diastolic dysfunction (impaired relaxation). Elevated left atrial pressure. Right Ventricle: The right ventricular size is normal. No increase in right ventricular wall thickness. Right ventricular systolic  function is normal. There is normal pulmonary artery systolic pressure. Left Atrium: Left atrial size was normal in size. Right Atrium: Right atrial size was normal in size. Pericardium: There is no evidence of pericardial effusion. Mitral Valve: The mitral valve is normal in structure. No evidence of mitral valve regurgitation. No evidence of mitral valve stenosis. Tricuspid Valve: The tricuspid valve is normal in structure. Tricuspid valve regurgitation is not demonstrated. No evidence of tricuspid stenosis. Aortic Valve: The aortic valve is normal in structure. Aortic valve regurgitation is not visualized. No aortic stenosis  is present. Aortic valve mean gradient measures 4.0 mmHg. Aortic valve peak gradient measures 9.5 mmHg. Aortic valve area, by VTI measures 3.27 cm. Pulmonic Valve: The pulmonic valve was normal in structure. Pulmonic valve regurgitation is not visualized. No evidence of pulmonic stenosis. Aorta: The aortic root is normal in size and structure. Venous: The inferior vena cava is normal in size with greater than 50% respiratory variability, suggesting right atrial pressure of 3 mmHg. IAS/Shunts: No atrial level shunt detected by color flow Doppler.  LEFT VENTRICLE PLAX 2D LVIDd:         3.80 cm  Diastology LVIDs:         2.50 cm  LV e' medial:    5.77 cm/s LV PW:         1.60 cm  LV E/e' medial:  12.5 LV IVS:        1.60 cm  LV e' lateral:   5.55 cm/s LVOT diam:     2.20 cm  LV E/e' lateral: 13.0 LV SV:         73 LV SV Index:   28 LVOT Area:     3.80 cm  RIGHT VENTRICLE             IVC RV Basal diam:  3.10 cm     IVC diam: 2.30 cm RV S prime:     17.60 cm/s TAPSE (M-mode): 2.4 cm LEFT ATRIUM             Index       RIGHT ATRIUM           Index LA diam:        3.60 cm 1.37 cm/m  RA Area:     16.80 cm LA Vol (A2C):   83.3 ml 31.71 ml/m RA Volume:   37.10 ml  14.12 ml/m LA Vol (A4C):   55.1 ml 20.98 ml/m LA Biplane Vol: 68.5 ml 26.08 ml/m  AORTIC VALVE AV Area (Vmax):    2.57 cm AV Area  (Vmean):   2.79 cm AV Area (VTI):     3.27 cm AV Vmax:           154.00 cm/s AV Vmean:          94.100 cm/s AV VTI:            0.223 m AV Peak Grad:      9.5 mmHg AV Mean Grad:      4.0 mmHg LVOT Vmax:         104.00 cm/s LVOT Vmean:        69.100 cm/s LVOT VTI:          0.192 m LVOT/AV VTI ratio: 0.86  AORTA Ao Root diam: 3.20 cm Ao Asc diam:  3.30 cm MITRAL VALVE MV Area (PHT): 4.57 cm     SHUNTS MV Decel Time: 166 msec     Systemic VTI:  0.19 m MV E velocity: 72.20 cm/s   Systemic Diam: 2.20 cm MV A velocity: 129.00 cm/s MV E/A ratio:  0.56 Ena Dawley MD Electronically signed by Ena Dawley MD Signature Date/Time: 10/11/2020/10:34:53 AM    Final     Cardiac Studies   none  Patient Profile     59 y.o. female admitted with transient CHB for PPM  Assessment & Plan    1. 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 PPM - her [employer] DDD PM is working normally. She can be discharged home with usual followup with  device clinic in 2 weeks and with Dr. Quentin Ore in 3 months 3.  Disp. - DC home.  For questions or updates, please contact [place] Please consult [email or web address] for contact info under Cardiology/STEMI.      Signed, Cristopher Peru, MD  10/12/2020, 8:34 AM  Patient ID: Derrek Gu, female   DOB: [birth year], 59 y.o.   MRN: [id number]

## 2020-10-12 NOTE — Progress Notes (Signed)
Pt discharged to home today with family.  Pt's IV removed.  Pt taken off telemetry and CCMD notified.  Pt left with all of their personal belongings.  AVS documentation reviewed with Pt and all question answered.

## 2020-10-12 NOTE — Progress Notes (Addendum)
Paged on called cardiology about 16 beat run PVCs. awaiting on call back. Will continue to monitor

## 2020-10-14 NOTE — Telephone Encounter (Signed)
Pt did not have procedure as she passed out prior to procedure and went to the ED and was admitted from there.

## 2020-10-15 DIAGNOSIS — C541 Malignant neoplasm of endometrium: Secondary | ICD-10-CM

## 2020-10-22 ENCOUNTER — Ambulatory Visit (INDEPENDENT_AMBULATORY_CARE_PROVIDER_SITE_OTHER): Payer: Managed Care, Other (non HMO) | Admitting: Emergency Medicine

## 2020-10-22 ENCOUNTER — Other Ambulatory Visit: Payer: Self-pay

## 2020-10-22 DIAGNOSIS — I459 Conduction disorder, unspecified: Secondary | ICD-10-CM

## 2020-10-22 LAB — CUP PACEART INCLINIC DEVICE CHECK
Battery Remaining Longevity: 184 mo
Battery Voltage: 3.23 V
Brady Statistic AP VP Percent: 0.01 %
Brady Statistic AP VS Percent: 0.01 %
Brady Statistic AS VP Percent: 0.23 %
Brady Statistic AS VS Percent: 99.75 %
Brady Statistic RA Percent Paced: 0.02 %
Brady Statistic RV Percent Paced: 0.24 %
Date Time Interrogation Session: 20211116155353
Implantable Lead Implant Date: 20211105
Implantable Lead Implant Date: 20211105
Implantable Lead Location: 753859
Implantable Lead Location: 753860
Implantable Lead Model: 5076
Implantable Lead Model: 5076
Implantable Pulse Generator Implant Date: 20211105
Lead Channel Impedance Value: 266 Ohm
Lead Channel Impedance Value: 323 Ohm
Lead Channel Impedance Value: 361 Ohm
Lead Channel Impedance Value: 418 Ohm
Lead Channel Pacing Threshold Amplitude: 0.875 V
Lead Channel Pacing Threshold Amplitude: 1.5 V
Lead Channel Pacing Threshold Pulse Width: 0.4 ms
Lead Channel Pacing Threshold Pulse Width: 0.4 ms
Lead Channel Sensing Intrinsic Amplitude: 2.5 mV
Lead Channel Sensing Intrinsic Amplitude: 2.875 mV
Lead Channel Sensing Intrinsic Amplitude: 4.875 mV
Lead Channel Sensing Intrinsic Amplitude: 5.625 mV
Lead Channel Setting Pacing Amplitude: 3.5 V
Lead Channel Setting Pacing Amplitude: 3.5 V
Lead Channel Setting Pacing Pulse Width: 0.6 ms
Lead Channel Setting Sensing Sensitivity: 1.2 mV

## 2020-10-22 NOTE — Progress Notes (Signed)
Wound check appointment. Steri-strips removed. Wound without redness or edema. Incision edges approximated, wound well healed. Normal device function. RV threshold noted to be increasing, however WNL, RV Output pulse width adjusted to 0.21ms today due to threshold measurement of 1.25@ 0.6 vs 1.5 and 0.4.   RA Thresholds, sensing, and impedances consistent with implant measurements. Device programmed at 3.5V for extra safety margin until 3 month visit. Histogram distribution appropriate for patient and level of activity. 2 fast A&V episodes reviewed, appear SVT.  Clock adjusted on device for DST.   Patient educated about wound care, arm mobility, lifting restrictions. Patient is enrolled in remote monitoring, next scheduled check 01/10/21.  ROV with Dr. Quentin Ore 01/21/21.

## 2020-11-01 ENCOUNTER — Telehealth: Payer: Self-pay | Admitting: *Deleted

## 2020-11-01 NOTE — Telephone Encounter (Signed)
Patient called and canceled appt for 11/30

## 2020-11-05 ENCOUNTER — Encounter: Payer: Managed Care, Other (non HMO) | Admitting: Gynecologic Oncology

## 2020-12-12 ENCOUNTER — Other Ambulatory Visit: Payer: Self-pay | Admitting: Internal Medicine

## 2020-12-13 ENCOUNTER — Other Ambulatory Visit: Payer: Self-pay | Admitting: Internal Medicine

## 2020-12-13 NOTE — Telephone Encounter (Signed)
Pt's pharmacy is requesting a refill on lisinopril. Pt has an upcoming appt in February 2022 with Dr. Quentin Ore. Would he like to refill this medication until appt? Please address

## 2021-01-03 ENCOUNTER — Other Ambulatory Visit: Payer: Self-pay | Admitting: Family Medicine

## 2021-01-03 DIAGNOSIS — I10 Essential (primary) hypertension: Secondary | ICD-10-CM

## 2021-01-03 NOTE — Telephone Encounter (Signed)
Called pt.  Left vm to call and schedule f/u appt.  Courtesy refill given; #30; no refills.   Requested Prescriptions  Pending Prescriptions Disp Refills  . amLODipine (NORVASC) 10 MG tablet [Pharmacy Med Name: AMLODIPINE BESYLATE 10MG  TABLETS] 30 tablet 0    Sig: Take 1 tablet (10 mg total) by mouth at bedtime.     Cardiovascular:  Calcium Channel Blockers Failed - 01/03/2021  8:43 AM      Failed - Valid encounter within last 6 months    Recent Outpatient Visits          11 months ago Type 2 diabetes mellitus with hyperglycemia, with long-term current use of insulin [facility])   [facility], Ranell Patrick, MD   11 months ago Type 2 diabetes mellitus with hyperglycemia, with long-term current use of insulin [facility])   [facility] [place] [facility], Ranell Patrick, MD   12 months ago Type 2 diabetes mellitus with hyperglycemia, with long-term current use of insulin [facility])   [facility], Ranell Patrick, MD   1 year ago Type 2 diabetes mellitus without complication, with long-term current use of insulin [facility])   [facility], Ranell Patrick, MD             Passed - Last BP in normal range    BP Readings from Last 1 Encounters:  10/12/20 125/79

## 2021-01-10 LAB — CUP PACEART REMOTE DEVICE CHECK
Battery Remaining Longevity: 183 mo
Battery Voltage: 3.22 V
Brady Statistic AP VP Percent: 0.01 %
Brady Statistic AP VS Percent: 0.05 %
Brady Statistic AS VP Percent: 0.03 %
Brady Statistic AS VS Percent: 99.91 %
Brady Statistic RA Percent Paced: 0.06 %
Brady Statistic RV Percent Paced: 0.03 %
Date Time Interrogation Session: 20220203224254
Implantable Lead Implant Date: 20211105
Implantable Lead Implant Date: 20211105
Implantable Lead Location: 753859
Implantable Lead Location: 753860
Implantable Lead Model: 5076
Implantable Lead Model: 5076
Implantable Pulse Generator Implant Date: 20211105
Lead Channel Impedance Value: 285 Ohm
Lead Channel Impedance Value: 323 Ohm
Lead Channel Impedance Value: 399 Ohm
Lead Channel Impedance Value: 418 Ohm
Lead Channel Pacing Threshold Amplitude: 0.625 V
Lead Channel Pacing Threshold Amplitude: 1 V
Lead Channel Pacing Threshold Pulse Width: 0.4 ms
Lead Channel Pacing Threshold Pulse Width: 0.4 ms
Lead Channel Sensing Intrinsic Amplitude: 2 mV
Lead Channel Sensing Intrinsic Amplitude: 2 mV
Lead Channel Sensing Intrinsic Amplitude: 3.75 mV
Lead Channel Sensing Intrinsic Amplitude: 3.75 mV
Lead Channel Setting Pacing Amplitude: 1.5 V
Lead Channel Setting Pacing Amplitude: 2 V
Lead Channel Setting Pacing Pulse Width: 0.4 ms
Lead Channel Setting Sensing Sensitivity: 1.2 mV

## 2021-01-21 ENCOUNTER — Other Ambulatory Visit: Payer: Self-pay

## 2021-01-21 ENCOUNTER — Ambulatory Visit: Payer: 59 | Admitting: Cardiology

## 2021-01-21 ENCOUNTER — Encounter: Payer: Self-pay | Admitting: Cardiology

## 2021-01-21 VITALS — BP 142/68 | HR 90 | Ht 68.0 in | Wt 360.0 lb

## 2021-01-21 DIAGNOSIS — I442 Atrioventricular block, complete: Secondary | ICD-10-CM

## 2021-01-21 NOTE — Patient Instructions (Signed)
Medication Instructions:  Your physician recommends that you continue on your current medications as directed. Please refer to the Current Medication list given to you today.  Labwork: None ordered.  Testing/Procedures: None ordered.  Follow-Up: Your physician wants you to follow-up in: 9 months with Dr. Quentin Ore.   You will receive a reminder letter in the mail two months in advance. If you don't receive a letter, please call our office to schedule the follow-up appointment.  Remote monitoring is used to monitor your Pacemaker from home. This monitoring reduces the number of office visits required to check your device to one time per year. It allows [place] to keep an eye on the functioning of your device to ensure it is working properly. You are scheduled for a device check from home on 04/11/2021. You may send your transmission at any time that day. If you have a wireless device, the transmission will be sent automatically. After your physician reviews your transmission, you will receive a postcard with your next transmission date.  Any Other Special Instructions Will Be Listed Below (If Applicable).  If you need a refill on your cardiac medications before your next appointment, please call your pharmacy.

## 2021-01-21 NOTE — Progress Notes (Signed)
Electrophysiology Office Follow up Visit Note:    Date:  01/21/2021   ID:  Leslie Mullen, DOB August 21, 1961, MRN [id number]  PCP:  Sue Lush, PA-C  [facility] Cardiologist:  Jenkins Rouge, MD  [place] Electrophysiologist:  Vickie Epley, MD    Interval History:    Leslie Mullen is a 60 y.o. 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 She has done well since her PPM implant. The incision has healed well. No syncope or presyncope since implant.   Past Medical History:  Diagnosis Date  . Abnormal uterine bleeding   . Allergy   . Anemia   . Asthma   . Cataract    BOTH EYES  . Complete heart block ([place])    a. Medtronic PPM 10/2020.  . Diabetes ([place])    type II   . Diabetic neuropathy ([place])   . Endometrial cancer ([place])   . GERD (gastroesophageal reflux disease)   . Hyperlipidemia   . Hypertension   . Morbid obesity ([place])   . Osteoarthritis    right knee   . Pneumonia     Past Surgical History:  Procedure Laterality Date  . CERVICAL BIOPSY    . CESAREAN SECTION     x3  . COLONOSCOPY WITH PROPOFOL N/A 06/29/2017   Procedure: COLONOSCOPY WITH PROPOFOL;  Surgeon: Irene Shipper, MD;  Location: [facility];  Service: Endoscopy;  Laterality: N/A;  . CYSTECTOMY Right 2010   Cyst removed from right groin.   Marland Kitchen PACEMAKER IMPLANT N/A 10/11/2020   Procedure: PACEMAKER IMPLANT;  Surgeon: Vickie Epley, MD;  Location: [facility];  Service: Cardiovascular;  Laterality: N/A;  . TONSILLECTOMY      Current Medications: Current Meds  Medication Sig  . amLODipine (NORVASC) 10 MG tablet Take 1 tablet (10 mg total) by mouth at bedtime.  . Colloidal Oatmeal 2 % CREA Apply 1 application topically in the morning and at bedtime.  . ferrous sulfate 325 (65 FE) MG tablet Take 325 mg by mouth 2 (two) times daily with a meal.  . gabapentin (NEURONTIN) 300 MG capsule Take 1 capsule (300 mg  total) by mouth 2 (two) times daily.  Marland Kitchen glucose blood test strip   . ibuprofen (ADVIL) 800 MG tablet Take 1 tablet (800 mg total) by mouth every 8 (eight) hours as needed for moderate pain. For AFTER surgery only  . insulin degludec (TRESIBA FLEXTOUCH) 200 UNIT/ML FlexTouch Pen Inject 80 Units into the skin every evening.  . Insulin Pen Needle 31G X 5 MM MISC   . JARDIANCE 25 MG TABS tablet TAKE 1 TABLET BY MOUTH DAILY (Patient taking differently: Take 25 mg by mouth daily.)  . lisinopril (ZESTRIL) 20 MG tablet Take 1 tablet (20 mg total) by mouth daily.  . medroxyPROGESTERone (PROVERA) 10 MG tablet Take 10 mg by mouth daily.  . metFORMIN (GLUCOPHAGE) 1000 MG tablet TAKE 1 TABLET(1000 MG) BY MOUTH TWICE DAILY WITH A MEAL (Patient taking differently: Take 1,000 mg by mouth 2 (two) times daily with a meal.)  . omeprazole (PRILOSEC) 20 MG capsule TAKE 1 CAPSULE(20 MG) BY MOUTH AT BEDTIME (Patient taking differently: Take 20 mg by mouth at bedtime.)  . ONETOUCH DELICA LANCETS 99B MISC   . OZEMPIC, 0.25 OR 0.5 MG/DOSE, 2 MG/1.5ML SOPN INEJCT 0.5 MG INTO THE SKIN ONCE WEEKLY  . pravastatin (PRAVACHOL) 40 MG tablet TAKE 1 TABLET(40 MG) BY MOUTH DAILY (Patient  taking differently: Take 40 mg by mouth every evening.)  . senna-docusate (SENOKOT-S) 8.6-50 MG tablet Take 2 tablets by mouth at bedtime. For AFTER surgery, hold if having loose stools  . traMADol (ULTRAM) 50 MG tablet TAKE 1 TABLET(50 MG) BY MOUTH TWICE DAILY AS NEEDED (Patient taking differently: Take 50 mg by mouth 2 (two) times daily.)  . [DISCONTINUED] traMADol (ULTRAM) 50 MG tablet Take 1 tablet (50 mg total) by mouth every 6 (six) hours as needed for severe pain. For AFTER surgery pain only, do not take and drive     Allergies:   Penicillins   Social History   Socioeconomic History  . Marital status: Married    Spouse name: Not on file  . Number of children: 2  . Years of education: Not on file  . Highest education level: Not on file   Occupational History  . Occupation: Intake Coordinator  Tobacco Use  . Smoking status: Former Smoker    Types: Cigarettes  . Smokeless tobacco: Never Used  . Tobacco comment: 18 years ago.  Vaping Use  . Vaping Use: Never used  Substance and Sexual Activity  . Alcohol use: No    Alcohol/week: 0.0 standard drinks  . Drug use: No  . Sexual activity: Not on file  Other Topics Concern  . Not on file  Social History Narrative  . Not on file   Social Determinants of Health   Financial Resource Strain: Not on file  Food Insecurity: Not on file  Transportation Needs: Not on file  Physical Activity: Not on file  Stress: Not on file  Social Connections: Not on file     Family History: The patient's family history includes Cancer in her mother; Diabetes in her father; Heart disease in her father. There is no history of Colon cancer, Esophageal cancer, Rectal cancer, or Stomach cancer. She was adopted.  ROS:   Please see the history of present illness.    All other systems reviewed and are negative.  EKGs/Labs/Other Studies Reviewed:    The following studies were reviewed today:  01/21/2021 Device interrogation personally reviewed <0.1% A and V pacing Lead parameters stable Battery longevity 15.2 years 1 SVT episode AAI>DDD 60-130   EKG:  The ekg ordered today demonstrates sinus rhythm.  Recent Labs: 10/03/2020: ALT 12 10/10/2020: Magnesium 2.2 10/11/2020: BUN 27; Creatinine, Ser 1.56; Hemoglobin 10.0; Platelets 267; Potassium 4.1; Sodium 139; TSH 1.200  Recent Lipid Panel    Component Value Date/Time   CHOL 155 12/22/2019 1712   TRIG 238 (H) 12/22/2019 1712   HDL 47 12/22/2019 1712   CHOLHDL 3.3 12/22/2019 1712   LDLCALC 69 12/22/2019 1712    Physical Exam:    VS:  BP (!) 142/68   Pulse 90   Ht 5\' 8"  (1.727 m)   Wt (!) 360 lb (163.3 kg)   SpO2 99%   BMI 54.74 kg/m     Wt Readings from Last 3 Encounters:  01/21/21 (!) 360 lb (163.3 kg)  10/10/20 (!) 359  lb 2.1 oz (162.9 kg)  10/03/20 (!) 361 lb 5 oz (163.9 kg)     GEN:  Well nourished, well developed in no acute distress HEENT: Normal NECK: No JVD; No carotid bruits LYMPHATICS: No lymphadenopathy CARDIAC: RRR, no murmurs, rubs, gallops. Pacemaker pocket well healed. RESPIRATORY:  Clear to auscultation without rales, wheezing or rhonchi  ABDOMEN: Soft, non-tender, non-distended MUSCULOSKELETAL:  No edema; No deformity  SKIN: Warm and dry NEUROLOGIC:  Alert and oriented x  3 PSYCHIATRIC:  Normal affect   ASSESSMENT:    1. Complete heart block (HCC)    PLAN:    In order of problems listed above:  1. Paroxysmal AV block Doing well after PPM implant.  PPM lead parameters stable. Good battery longevity. Plan to see back in clinic in 9 months. Continue remote interrogations.    Medication Adjustments/Labs and Tests Ordered: Current medicines are reviewed at length with the patient today.  Concerns regarding medicines are outlined above.  Orders Placed This Encounter  Procedures  . EKG 12-Lead   No orders of the defined types were placed in this encounter.    Signed, Lars Mage, MD, [facility]  01/21/2021 8:10 PM    Electrophysiology [facility]

## 2021-01-31 ENCOUNTER — Other Ambulatory Visit: Payer: Self-pay | Admitting: Family Medicine

## 2021-01-31 ENCOUNTER — Other Ambulatory Visit: Payer: Self-pay | Admitting: Internal Medicine

## 2021-01-31 DIAGNOSIS — Z794 Long term (current) use of insulin: Secondary | ICD-10-CM

## 2021-01-31 DIAGNOSIS — E1165 Type 2 diabetes mellitus with hyperglycemia: Secondary | ICD-10-CM

## 2021-01-31 DIAGNOSIS — I10 Essential (primary) hypertension: Secondary | ICD-10-CM

## 2021-01-31 NOTE — Telephone Encounter (Signed)
Please call patient. Needs appointment before we can refill. Have not been seen over 1 year

## 2021-01-31 NOTE — Telephone Encounter (Signed)
LMTCB to schedule an appt.

## 2021-02-06 ENCOUNTER — Other Ambulatory Visit: Payer: Self-pay | Admitting: Family Medicine

## 2021-02-06 DIAGNOSIS — K219 Gastro-esophageal reflux disease without esophagitis: Secondary | ICD-10-CM

## 2021-02-06 NOTE — Telephone Encounter (Signed)
Courtesy refill. Patient needs appt.

## 2021-03-06 ENCOUNTER — Other Ambulatory Visit: Payer: Self-pay | Admitting: Family Medicine

## 2021-03-06 DIAGNOSIS — K219 Gastro-esophageal reflux disease without esophagitis: Secondary | ICD-10-CM

## 2021-03-06 NOTE — Telephone Encounter (Signed)
Requested medications are due for refill today.  yes  Requested medications are on the active medications list.  yes  Last refill. 02/06/2021  Future visit scheduled.   no  Notes to clinic.

## 2021-04-03 ENCOUNTER — Other Ambulatory Visit: Payer: Self-pay | Admitting: Family Medicine

## 2021-04-03 DIAGNOSIS — E1165 Type 2 diabetes mellitus with hyperglycemia: Secondary | ICD-10-CM

## 2021-04-03 DIAGNOSIS — E1142 Type 2 diabetes mellitus with diabetic polyneuropathy: Secondary | ICD-10-CM

## 2021-04-03 DIAGNOSIS — Z794 Long term (current) use of insulin: Secondary | ICD-10-CM

## 2021-04-03 DIAGNOSIS — G2581 Restless legs syndrome: Secondary | ICD-10-CM

## 2021-04-11 ENCOUNTER — Ambulatory Visit (INDEPENDENT_AMBULATORY_CARE_PROVIDER_SITE_OTHER): Payer: 59

## 2021-04-11 DIAGNOSIS — I442 Atrioventricular block, complete: Secondary | ICD-10-CM

## 2021-04-11 LAB — CUP PACEART REMOTE DEVICE CHECK
Battery Remaining Longevity: 180 mo
Battery Voltage: 3.2 V
Brady Statistic AP VP Percent: 0.01 %
Brady Statistic AP VS Percent: 0.03 %
Brady Statistic AS VP Percent: 0.1 %
Brady Statistic AS VS Percent: 99.86 %
Brady Statistic RA Percent Paced: 0.04 %
Brady Statistic RV Percent Paced: 0.11 %
Date Time Interrogation Session: 20220505205044
Implantable Lead Implant Date: 20211105
Implantable Lead Implant Date: 20211105
Implantable Lead Location: 753859
Implantable Lead Location: 753860
Implantable Lead Model: 5076
Implantable Lead Model: 5076
Implantable Pulse Generator Implant Date: 20211105
Lead Channel Impedance Value: 285 Ohm
Lead Channel Impedance Value: 323 Ohm
Lead Channel Impedance Value: 361 Ohm
Lead Channel Impedance Value: 380 Ohm
Lead Channel Pacing Threshold Amplitude: 0.625 V
Lead Channel Pacing Threshold Amplitude: 1.25 V
Lead Channel Pacing Threshold Pulse Width: 0.4 ms
Lead Channel Pacing Threshold Pulse Width: 0.4 ms
Lead Channel Sensing Intrinsic Amplitude: 2.25 mV
Lead Channel Sensing Intrinsic Amplitude: 2.25 mV
Lead Channel Sensing Intrinsic Amplitude: 3.375 mV
Lead Channel Sensing Intrinsic Amplitude: 3.375 mV
Lead Channel Setting Pacing Amplitude: 1.5 V
Lead Channel Setting Pacing Amplitude: 2.5 V
Lead Channel Setting Pacing Pulse Width: 0.4 ms
Lead Channel Setting Sensing Sensitivity: 1.2 mV

## 2021-04-24 NOTE — Progress Notes (Signed)
Remote pacemaker transmission.   

## 2021-04-29 ENCOUNTER — Encounter: Payer: Self-pay | Admitting: Internal Medicine

## 2021-05-27 ENCOUNTER — Telehealth: Payer: Self-pay

## 2021-05-27 NOTE — Telephone Encounter (Signed)
LM for Leslie Mullen stating that Dr. Denman George wants to reach out to her cardiologists to clear her for the procedure. Once this clearance is obtained then a D&C and IUD placement can be scheduled. Requested her to call the office back to let us know who is managing her blood sugar so a recent Hgb A1c can be obtained.

## 2021-05-27 NOTE — Telephone Encounter (Signed)
Ms Leslie Mullen stated that she is calling to reschedule her D&C and IUD placement from last fall when she had emergency surgery for [facility] and had to cancel surgery for Endometrial cancer. Information given to Melissa Cross,NP to review with Dr. Denman George. Will call Ms Leslie Mullen back with follow recommendations.

## 2021-05-28 ENCOUNTER — Telehealth: Payer: Self-pay | Admitting: *Deleted

## 2021-05-28 NOTE — Telephone Encounter (Signed)
   [facility] Pre-operative Risk Assessment    Patient Name: Leslie Mullen  DOB: 10-Jan-1961  MRN: [id number]   HEARTCARE STAFF: - Please ensure there is not already an duplicate clearance open for this procedure. - Under Visit Info/Reason for Call, type in Other and utilize the format Clearance MM/DD/YY or Clearance TBD. Do not use dashes or single digits. - If request is for dental extraction, please clarify the # of teeth to be extracted. - If the patient is currently at the dentist's office, call Pre-Op APP to address. If the patient is not currently in the dentist office, please route to the Pre-Op pool  Request for surgical clearance:  What type of surgery is being performed? ENDOMETRIAL CANCER; NEEDS D&C WITH IUD PLACEMENT   When is this surgery scheduled? TBD   What type of clearance is required (medical clearance vs. Pharmacy clearance to hold med vs. Both)? MEDICAL  Are there any medications that need to be held prior to surgery and how long? NONE LISTED   Practice name and name of physician performing surgery? [facility]; DR. Terrence Dupont ROSSI   What is the office phone number? [phone]   7.   What is the office fax number? [phone]  8.   Anesthesia type (None, local, MAC, general) ? CHOICE   Julaine Hua 05/28/2021, 5:04 PM  _________________________________________________________________   (provider comments below)

## 2021-05-28 NOTE — Telephone Encounter (Addendum)
Faxed Surgical Risk Stratification/ Optimization form to be reviewed prior to scheduling D&C and IUD. Date of Surgery TBD to Dr. Jenkins Rouge.

## 2021-05-29 NOTE — Telephone Encounter (Signed)
Left VM

## 2021-05-30 ENCOUNTER — Telehealth: Payer: Self-pay | Admitting: General Practice

## 2021-05-30 NOTE — Telephone Encounter (Signed)
Follow Up:    Pt is returning Jesse's call from today.

## 2021-05-30 NOTE — Telephone Encounter (Signed)
Left message to call back [phone] on 05/30/2021.  Patient is call back.

## 2021-05-30 NOTE — Telephone Encounter (Signed)
Follow Up:    Pt is returning Jesse's call

## 2021-05-30 NOTE — Telephone Encounter (Signed)
Left message to call back 9:07 AM on 05/30/2021

## 2021-06-04 NOTE — Telephone Encounter (Addendum)
Dr. Quentin Ore, [place] last saw this patient 01/21/21 following PPM for CHB. She does not have a history of ischemic  heart disease, but she is very sedentary. She specifically denies angina. Because she can't complete 4.0 METS due to back and knee pain, I am unable to clear her using our general protocol. Are you comfortable giving clearance for her planned low risk procedure of D&C?

## 2021-06-10 NOTE — Telephone Encounter (Signed)
I called and s/w Leslie Mullen with surgeon's office. Leslie Mullen was checking on the status of clearance back from 05/28/21. I did assure her that we did receive the clearance request and looks like a couple of things, one being trying to reach the pt for pre op assessment as well as waiting on MD Dr. Mardene Speak input. I assured Leslie Mullen that I will send a note to our pre op provider about the status of the clearance. Leslie Mullen thanked me for the call.

## 2021-06-10 NOTE — Telephone Encounter (Signed)
I called and s/w Sharyn Lull with surgeon's office. Sharyn Lull was checking on the status of clearance back from 05/28/21. I did assure her that we did receive the clearance request and looks like a couple of things, one being trying to reach the pt for pre op assessment as well as waiting on MD Dr. Mardene Speak input. I assured Sharyn Lull that I will send a note to our pre op provider about the status of the clearance. Sharyn Lull thanked me for the call.

## 2021-06-10 NOTE — Telephone Encounter (Signed)
Leslie Mullen is returning a call

## 2021-06-11 NOTE — Telephone Encounter (Addendum)
   Patient Name: Leslie Mullen  DOB: [birth year]  MRN: [id number]   Primary Cardiologist: Dr. Lars Mage (patient was seen by Dr. Johnsie Cancel once in 10/2020 upon arrival to the hospital but follows with cardiology for EP issues.)  Chart reviewed as part of pre-op protocol, coming on board the pool today. As below, we are waiting on clearance from Dr. Quentin Ore per [place] note 06/04/21. Surgeon has reached out for updated clearance decision - will re-route to Dr. Quentin Ore for his input. Dr. Quentin Ore  - - Please route response to P CV DIV PREOP (the pre-op pool). Thank you.   Charlie Pitter, PA-C 06/11/2021, 10:47 AM

## 2021-06-12 ENCOUNTER — Other Ambulatory Visit: Payer: Self-pay | Admitting: Cardiology

## 2021-06-15 ENCOUNTER — Other Ambulatory Visit: Payer: Self-pay

## 2021-06-15 ENCOUNTER — Emergency Department (HOSPITAL_COMMUNITY)
Admission: EM | Admit: 2021-06-15 | Discharge: 2021-06-15 | Disposition: A | Discharge status: Home / Self Care | Payer: 59 | Attending: Emergency Medicine | Admitting: Emergency Medicine

## 2021-06-15 ENCOUNTER — Emergency Department (HOSPITAL_COMMUNITY): Payer: 59

## 2021-06-15 DIAGNOSIS — J45909 Unspecified asthma, uncomplicated: Secondary | ICD-10-CM | POA: Insufficient documentation

## 2021-06-15 DIAGNOSIS — Z7982 Long term (current) use of aspirin: Secondary | ICD-10-CM | POA: Insufficient documentation

## 2021-06-15 DIAGNOSIS — E1142 Type 2 diabetes mellitus with diabetic polyneuropathy: Secondary | ICD-10-CM | POA: Diagnosis not present

## 2021-06-15 DIAGNOSIS — Z8542 Personal history of malignant neoplasm of other parts of uterus: Secondary | ICD-10-CM | POA: Insufficient documentation

## 2021-06-15 DIAGNOSIS — R0789 Other chest pain: Secondary | ICD-10-CM | POA: Diagnosis not present

## 2021-06-15 DIAGNOSIS — Z79899 Other long term (current) drug therapy: Secondary | ICD-10-CM | POA: Diagnosis not present

## 2021-06-15 DIAGNOSIS — I10 Essential (primary) hypertension: Secondary | ICD-10-CM | POA: Diagnosis not present

## 2021-06-15 DIAGNOSIS — Z87891 Personal history of nicotine dependence: Secondary | ICD-10-CM | POA: Diagnosis not present

## 2021-06-15 DIAGNOSIS — R079 Chest pain, unspecified: Secondary | ICD-10-CM

## 2021-06-15 DIAGNOSIS — Z794 Long term (current) use of insulin: Secondary | ICD-10-CM | POA: Diagnosis not present

## 2021-06-15 LAB — BASIC METABOLIC PANEL
Anion gap: 13 (ref 5–15)
BUN: 23 mg/dL — ABNORMAL HIGH (ref 6–20)
CO2: 20 mmol/L — ABNORMAL LOW (ref 22–32)
Calcium: 8.9 mg/dL (ref 8.9–10.3)
Chloride: 107 mmol/L (ref 98–111)
Creatinine, Ser: 1.68 mg/dL — ABNORMAL HIGH (ref 0.44–1.00)
GFR, Estimated: 35 mL/min — ABNORMAL LOW (ref >60)
Glucose, Bld: 262 mg/dL — ABNORMAL HIGH (ref 70–99)
Potassium: 4.2 mmol/L (ref 3.5–5.1)
Sodium: 140 mmol/L (ref 135–145)

## 2021-06-15 LAB — CBC
HCT: 38 % (ref 36.0–46.0)
Hemoglobin: 12.1 g/dL (ref 12.0–15.0)
MCH: 28.8 pg (ref 26.0–34.0)
MCHC: 31.8 g/dL (ref 30.0–36.0)
MCV: 90.5 fL (ref 80.0–100.0)
Platelets: 308 10*3/uL (ref 150–400)
RBC: 4.2 MIL/uL (ref 3.87–5.11)
RDW: 12.5 % (ref 11.5–15.5)
WBC: 13.9 10*3/uL — ABNORMAL HIGH (ref 4.0–10.5)
nRBC: 0 % (ref 0.0–0.2)

## 2021-06-15 LAB — PROTIME-INR
INR: 1 (ref 0.8–1.2)
Prothrombin Time: 13.1 seconds (ref 11.4–15.2)

## 2021-06-15 LAB — TROPONIN I (HIGH SENSITIVITY)
Troponin I (High Sensitivity): 10 ng/L (ref <18)
Troponin I (High Sensitivity): 10 ng/L (ref <18)

## 2021-06-15 MED ORDER — AMLODIPINE BESYLATE 5 MG PO TABS
10.0000 mg | ORAL_TABLET | Freq: Once | ORAL | Status: AC
  Administered 2021-06-15: 10 mg via ORAL
  Filled 2021-06-15: qty 2

## 2021-06-15 NOTE — ED Provider Notes (Signed)
[facility] EMERGENCY DEPARTMENT Provider Note   CSN: [id number] Arrival date & time: 06/15/21  0033     History Chief Complaint  Patient presents with   Chest Pain    Leslie Mullen is a 60 y.o. female.  Presents with chief complaint of mid chest heaviness.  Describes it as persistent heaviness sensation started around 10 AM today.  Associated with some increased burping has been going on for several weeks.  She denies any fevers or cough.  Denies shortness of breath denies palpitations or diaphoresis.      Past Medical History:  Diagnosis Date   Abnormal uterine bleeding    Allergy    Anemia    Asthma    Cataract    BOTH EYES   Complete heart block ([place])    a. Medtronic PPM 10/2020.   Diabetes ([place])    type II    Diabetic neuropathy ([place])    Endometrial cancer ([place])    GERD (gastroesophageal reflux disease)    Hyperlipidemia    Hypertension    Morbid obesity ([place])    Osteoarthritis    right knee    Pneumonia     Patient Active Problem List   Diagnosis Date Noted   Heart block 10/10/2020   Syncope 10/10/2020   Type 2 diabetes mellitus with diabetic polyneuropathy, with long-term current use of insulin ([place]) 01/31/2020   Type 2 diabetes mellitus with hyperglycemia, with long-term current use of insulin ([place]) 01/31/2020   Type 2 diabetes mellitus with stage 3a chronic kidney disease, with long-term current use of insulin ([place]) 01/31/2020   Abnormal feces    Benign neoplasm of ascending colon    Benign neoplasm of cecum    Benign neoplasm of transverse colon     Past Surgical History:  Procedure Laterality Date   CERVICAL BIOPSY     CESAREAN SECTION     x3   COLONOSCOPY WITH PROPOFOL N/A 06/29/2017   Procedure: COLONOSCOPY WITH PROPOFOL;  Surgeon: Irene Shipper, MD;  Location: [facility];  Service: Endoscopy;  Laterality: N/A;   CYSTECTOMY Right 2010   Cyst removed from right groin.    PACEMAKER IMPLANT N/A 10/11/2020   Procedure:  PACEMAKER IMPLANT;  Surgeon: Vickie Epley, MD;  Location: [place] CV LAB;  Service: Cardiovascular;  Laterality: N/A;   TONSILLECTOMY       OB History   No obstetric history on file.     Family History  Adopted: Yes  Problem Relation Age of Onset   Diabetes Father    Heart disease Father    Cancer Mother        colon   Colon cancer Neg Hx    Esophageal cancer Neg Hx    Rectal cancer Neg Hx    Stomach cancer Neg Hx     Social History   Tobacco Use   Smoking status: Former    Pack years: 0.00    Types: Cigarettes   Smokeless tobacco: Never   Tobacco comments:    18 years ago.  Vaping Use   Vaping Use: Never used  Substance Use Topics   Alcohol use: No    Alcohol/week: 0.0 standard drinks   Drug use: No    Home Medications Prior to Admission medications   Medication Sig Start Date End Date Taking? Authorizing Provider  amLODipine (NORVASC) 10 MG tablet Take 1 tablet (10 mg total) by mouth at bedtime. 01/03/21  Yes [name], MD  gabapentin (NEURONTIN) 300  MG capsule Take 1 capsule (300 mg total) by mouth 2 (two) times daily. 10/12/20  Yes Evans Lance, MD  insulin degludec (TRESIBA FLEXTOUCH) 200 UNIT/ML FlexTouch Pen Inject 80 Units into the skin every evening. 10/12/20  Yes Evans Lance, MD  lisinopril (ZESTRIL) 20 MG tablet TAKE 1 TABLET(20 MG) BY MOUTH DAILY Patient taking differently: Take 20 mg by mouth daily. 06/13/21  Yes Vickie Epley, MD  loratadine (CLARITIN) 10 MG tablet Take 10 mg by mouth daily as needed for allergies.   Yes [provider]  medroxyPROGESTERone (PROVERA) 10 MG tablet Take 10 mg by mouth daily. 08/26/20  Yes [provider]  omeprazole (PRILOSEC) 20 MG capsule TAKE 1 CAPSULE(20 MG) BY MOUTH AT BEDTIME Patient taking differently: Take 20 mg by mouth at bedtime. 02/06/21  Yes [name], MD  OZEMPIC, 0.25 OR 0.5 MG/DOSE, 2 MG/1.5ML SOPN INEJCT 0.5 MG INTO THE SKIN ONCE WEEKLY Patient taking  differently: Inject 2 mg into the skin every Saturday. 12/12/20  Yes Shamleffer, Melanie Crazier, MD  pravastatin (PRAVACHOL) 40 MG tablet TAKE 1 TABLET(40 MG) BY MOUTH DAILY Patient taking differently: Take 40 mg by mouth every evening. 07/23/20  Yes [name], MD  SYNJARDY XR 12.04-999 MG TB24 Take 1 tablet by mouth 2 (two) times daily. 05/30/21  Yes [provider]  traMADol (ULTRAM) 50 MG tablet TAKE 1 TABLET(50 MG) BY MOUTH TWICE DAILY AS NEEDED Patient taking differently: Take 50 mg by mouth 2 (two) times daily. 09/11/20  Yes [name], MD  glucose blood test strip  07/24/15   [provider]  ibuprofen (ADVIL) 800 MG tablet Take 1 tablet (800 mg total) by mouth every 8 (eight) hours as needed for moderate pain. For AFTER surgery only Patient not taking: Reported on 06/15/2021 09/23/20   Joylene John D, NP  Insulin Pen Needle 31G X 5 MM MISC  08/01/15   [provider]  JARDIANCE 25 MG TABS tablet TAKE 1 TABLET BY MOUTH DAILY Patient not taking: No sig reported 07/05/20   [name], MD  metFORMIN (GLUCOPHAGE) 1000 MG tablet TAKE 1 TABLET(1000 MG) BY MOUTH TWICE DAILY WITH A MEAL Patient not taking: No sig reported 07/23/20   [name], MD  [facility] DELICA LANCETS [address] [place]  08/01/15   [provider]  senna-docusate (SENOKOT-S) 8.6-50 MG tablet Take 2 tablets by mouth at bedtime. For AFTER surgery, hold if having loose stools Patient not taking: No sig reported 09/23/20   Joylene John D, NP  tiZANidine (ZANAFLEX) 4 MG tablet Take 4 mg by mouth 3 (three) times daily. 06/12/21   [provider]    Allergies    Penicillins  Review of Systems   Review of Systems  Constitutional:  Negative for fever.  HENT:  Negative for ear pain.   Eyes:  Negative for pain.  Respiratory:  Negative for cough.   Cardiovascular:  Positive for chest pain.  Gastrointestinal:  Negative for abdominal pain.  Genitourinary:  Negative for flank  pain.  Musculoskeletal:  Negative for back pain.  Skin:  Negative for rash.  Neurological:  Negative for headaches.   Physical Exam Updated Vital Signs BP (!) 192/91 (BP Location: Right Arm)   Pulse 96   Temp 98.1 F (36.7 C) (Oral)   Resp 19   SpO2 100%   Physical Exam Constitutional:      General: She is not in acute distress.    Appearance: Normal appearance.  HENT:     Head: Normocephalic.     Nose: Nose normal.  Eyes:     Extraocular Movements: Extraocular movements intact.  Cardiovascular:     Rate and Rhythm: Normal rate.  Pulmonary:     Effort: Pulmonary effort is normal.  Musculoskeletal:        General: Normal range of motion.     Cervical back: Normal range of motion.  Neurological:     General: No focal deficit present.     Mental Status: She is alert. Mental status is at baseline.    ED Results / Procedures / Treatments   Labs (all labs ordered are listed, but only abnormal results are displayed) Labs Reviewed  BASIC METABOLIC PANEL - Abnormal; Notable for the following components:      Result Value   CO2 20 (*)    Glucose, Bld 262 (*)    BUN 23 (*)    Creatinine, Ser 1.68 (*)    GFR, Estimated 35 (*)    All other components within normal limits  CBC - Abnormal; Notable for the following components:   WBC 13.9 (*)    All other components within normal limits  PROTIME-INR  TROPONIN I (HIGH SENSITIVITY)  TROPONIN I (HIGH SENSITIVITY)    EKG EKG Interpretation  Date/Time:  Sunday June 15 2021 00:39:35 EDT Ventricular Rate:  98 PR Interval:  168 QRS Duration: 76 QT Interval:  346 QTC Calculation: 441 R Axis:   26 Text Interpretation: Normal sinus rhythm Cannot rule out Anterior infarct , age undetermined Abnormal ECG Confirmed by Merrily Pew [phone]) on 06/15/2021 1:06:03 AM  Radiology DG Chest 2 View  Result Date: 06/15/2021 CLINICAL DATA:  59 year old female with chest pain. EXAM: CHEST - 2 VIEW COMPARISON:  Chest radiograph dated  10/12/2020 FINDINGS: There is mild eventration of the left hemidiaphragm. No focal consolidation, pleural effusion or pneumothorax. The cardiac silhouette is within limits. Left pectoral pacemaker device. No acute osseous pathology. IMPRESSION: No active cardiopulmonary disease. Electronically Signed   By: Anner Crete M.D.   On: 06/15/2021 01:19    Procedures Procedures   Medications Ordered in ED Medications  amLODipine (NORVASC) tablet 10 mg (has no administration in time range)    ED Course  I have reviewed the triage vital signs and the nursing notes.  Pertinent labs & imaging results that were available during my care of the patient were reviewed by me and considered in my medical decision making (see chart for details).    MDM Rules/Calculators/A&P                          EKG is nonspecific, sinus rhythm, normal rate, no ST elevations noted.  Labs are unremarkable troponin is negative x2.  Chest imaging is unremarkable as well no acute findings per radiologist.  Patient's chest pain is reproducible with palpation of the chest wall.  Given work-up and history and physical exam today I doubt acute coronary syndrome.  Will recommend outpatient follow-up with cardiologist within the week.  Recommending immediate return for worsening pain difficulty breathing or any additional concerns.  Final Clinical Impression(s) / ED Diagnoses Final diagnoses:  Nonspecific chest pain    Rx / DC Orders ED Discharge Orders     None        Luna Fuse, MD 06/15/21 [phone]

## 2021-06-15 NOTE — ED Triage Notes (Signed)
Pt c/o discomfort/ heaviness in chest since 10am. Pt states indigestion/ burping x74mo, prescribed prilosec has not helped. Medtronic pacemaker

## 2021-06-15 NOTE — Discharge Instructions (Addendum)
Call your cardiologist as discussed in the next 2-3 days.   Return immediately back to the ER if:  Your symptoms worsen within the next 12-24 hours. You develop new symptoms such as new fevers, persistent vomiting, new pain, shortness of breath, or new weakness or numbness, or if you have any other concerns.

## 2021-06-16 ENCOUNTER — Telehealth: Payer: Self-pay | Admitting: General Practice

## 2021-06-16 NOTE — Telephone Encounter (Signed)
Sharyn Lull is calling to speak with Arbie Cookey in regards to this pt

## 2021-06-16 NOTE — Telephone Encounter (Signed)
ACCIDENTALLY REMOVED FROM THE PRE OP POOL.

## 2021-06-16 NOTE — Telephone Encounter (Signed)
Sent FYI to surgeon's office we are awaiting on Dr. Mardene Speak input for pre op clearance. Once pt has been cleared we will fax notes.

## 2021-06-16 NOTE — Telephone Encounter (Signed)
Will send FYI to surgeon's office awaiting on Dr. Mardene Speak input in regards to pre op clearance.

## 2021-06-17 NOTE — Telephone Encounter (Signed)
I left a message for the patient to return my call about her pre operative clearance.  I have also send a fax over to requesting surgeon's office via Epic.

## 2021-06-17 NOTE — Telephone Encounter (Signed)
   Name: Fotini Lemus  DOB: July 11, 1961  MRN: [id number]   Primary Cardiologist: Jenkins Rouge, MD  Chart reviewed as part of pre-operative protocol coverage. Patient was contacted 06/17/2021 in reference to pre-operative risk assessment for pending surgery as outlined below.  Kariyah Baugh was last seen on 01/21/21 by Dr. Quentin Ore.  Since that day, Jahira Swiss has done well. She does not have a history of ischemic heart disease and heart cath in 2017 with no obstructive CAD. She is sedentary and unable to complete 4.0 METS due to back and knee pain. Per Dr. Quentin Ore, via verbal communication, she is cleared for surgery without further testing.   Therefore, based on ACC/AHA guidelines, the patient would be at acceptable risk for the planned procedure without further cardiovascular testing.   The patient was advised that if she develops new symptoms prior to surgery to contact our office to arrange for a follow-up visit, and she verbalized understanding.  I will route this recommendation to the requesting party via Epic fax function and remove from pre-op pool. Please call with questions.  Tami Lin Shalane Florendo, PA 06/17/2021, 8:44 AM

## 2021-06-18 NOTE — Telephone Encounter (Signed)
Told Ms Mode that 07-29-21 is available for her surgery. Ms Argueta stated that this date will work for her.  She will hear from pre-surgical testing ~2 weeks prior to surgery to set up pre-p appointment. Pt verbalized understanding.

## 2021-06-18 NOTE — Telephone Encounter (Signed)
I s/w surgery scheduler Sharyn Lull and confirmed that they did receive clearance notes yesterday.

## 2021-07-11 ENCOUNTER — Telehealth: Payer: Self-pay

## 2021-07-11 ENCOUNTER — Ambulatory Visit (INDEPENDENT_AMBULATORY_CARE_PROVIDER_SITE_OTHER): Payer: 59

## 2021-07-11 DIAGNOSIS — I442 Atrioventricular block, complete: Secondary | ICD-10-CM | POA: Diagnosis not present

## 2021-07-11 DIAGNOSIS — R195 Other fecal abnormalities: Secondary | ICD-10-CM

## 2021-07-11 NOTE — Telephone Encounter (Signed)
Dr. Henrene Pastor,  This patient is currently scheduled for a PV on 07/21/2021 with a procedure scheduled at [facility] on 08/04/2021.  On chart prep, her BMI=54.75.  Would you like for the patient to be scheduled for an OV or a direct at [facility]? Please advise

## 2021-07-11 NOTE — Telephone Encounter (Signed)
Set up as a direct outpatient during a [place] endoscopy block.  Thanks

## 2021-07-14 LAB — CUP PACEART REMOTE DEVICE CHECK
Battery Remaining Longevity: 177 mo
Battery Voltage: 3.18 V
Brady Statistic AP VP Percent: 0.02 %
Brady Statistic AP VS Percent: 0.03 %
Brady Statistic AS VP Percent: 0.43 %
Brady Statistic AS VS Percent: 99.52 %
Brady Statistic RA Percent Paced: 0.05 %
Brady Statistic RV Percent Paced: 0.45 %
Date Time Interrogation Session: 20220804193331
Implantable Lead Implant Date: 20211105
Implantable Lead Implant Date: 20211105
Implantable Lead Location: 753859
Implantable Lead Location: 753860
Implantable Lead Model: 5076
Implantable Lead Model: 5076
Implantable Pulse Generator Implant Date: 20211105
Lead Channel Impedance Value: 285 Ohm
Lead Channel Impedance Value: 285 Ohm
Lead Channel Impedance Value: 361 Ohm
Lead Channel Impedance Value: 532 Ohm
Lead Channel Pacing Threshold Amplitude: 0.75 V
Lead Channel Pacing Threshold Amplitude: 1.125 V
Lead Channel Pacing Threshold Pulse Width: 0.4 ms
Lead Channel Pacing Threshold Pulse Width: 0.4 ms
Lead Channel Sensing Intrinsic Amplitude: 2 mV
Lead Channel Sensing Intrinsic Amplitude: 2 mV
Lead Channel Sensing Intrinsic Amplitude: 3.25 mV
Lead Channel Sensing Intrinsic Amplitude: 3.25 mV
Lead Channel Setting Pacing Amplitude: 1.75 V
Lead Channel Setting Pacing Amplitude: 2.25 V
Lead Channel Setting Pacing Pulse Width: 0.4 ms
Lead Channel Setting Sensing Sensitivity: 1.2 mV

## 2021-07-16 NOTE — Telephone Encounter (Signed)
Attempted to reach patient - PV appt is scheduled on 07/21/2021- procedure scheduled at [facility] on 09/15/2021- requesting patient to rescheduled PV closer to the time of procedure- LMTCB 07/16/2021 at 1655

## 2021-07-16 NOTE — Telephone Encounter (Signed)
Scheduled patient for a colonoscopy with propofol at [facility] on 09/15/2021 at 8:30am.  (September block was already filled.)

## 2021-07-17 ENCOUNTER — Telehealth: Payer: Self-pay | Admitting: Internal Medicine

## 2021-07-17 NOTE — Telephone Encounter (Signed)
Pt colonoscopy cancelled as requested.

## 2021-07-17 NOTE — Telephone Encounter (Signed)
Leslie Mullen,   I spoke with the patient. She request her [facility] colonoscopy be cancelled at this time.

## 2021-07-21 NOTE — Telephone Encounter (Signed)
Please see previous documentation- patient responded to [facility] was cancelled per documentation

## 2021-07-22 ENCOUNTER — Encounter: Payer: Self-pay | Admitting: Cardiology

## 2021-07-22 ENCOUNTER — Other Ambulatory Visit: Payer: Self-pay

## 2021-07-22 ENCOUNTER — Encounter (HOSPITAL_COMMUNITY): Payer: Self-pay

## 2021-07-22 ENCOUNTER — Encounter (HOSPITAL_COMMUNITY)
Admission: RE | Admit: 2021-07-22 | Discharge: 2021-07-22 | Disposition: A | Discharge status: Home / Self Care | Payer: 59 | Source: Ambulatory Visit | Attending: Gynecologic Oncology | Admitting: Gynecologic Oncology

## 2021-07-22 DIAGNOSIS — Z01812 Encounter for preprocedural laboratory examination: Secondary | ICD-10-CM | POA: Insufficient documentation

## 2021-07-22 LAB — BASIC METABOLIC PANEL
Anion gap: 9 (ref 5–15)
BUN: 21 mg/dL — ABNORMAL HIGH (ref 6–20)
CO2: 24 mmol/L (ref 22–32)
Calcium: 8.7 mg/dL — ABNORMAL LOW (ref 8.9–10.3)
Chloride: 109 mmol/L (ref 98–111)
Creatinine, Ser: 1.34 mg/dL — ABNORMAL HIGH (ref 0.44–1.00)
GFR, Estimated: 46 mL/min — ABNORMAL LOW (ref >60)
Glucose, Bld: 80 mg/dL (ref 70–99)
Potassium: 4 mmol/L (ref 3.5–5.1)
Sodium: 142 mmol/L (ref 135–145)

## 2021-07-22 LAB — CBC
HCT: 38 % (ref 36.0–46.0)
Hemoglobin: 11.9 g/dL — ABNORMAL LOW (ref 12.0–15.0)
MCH: 28.7 pg (ref 26.0–34.0)
MCHC: 31.3 g/dL (ref 30.0–36.0)
MCV: 91.6 fL (ref 80.0–100.0)
Platelets: 315 10*3/uL (ref 150–400)
RBC: 4.15 MIL/uL (ref 3.87–5.11)
RDW: 12.8 % (ref 11.5–15.5)
WBC: 11.2 10*3/uL — ABNORMAL HIGH (ref 4.0–10.5)
nRBC: 0 % (ref 0.0–0.2)

## 2021-07-22 LAB — GLUCOSE, CAPILLARY: Glucose-Capillary: 84 mg/dL (ref 70–99)

## 2021-07-22 LAB — HEMOGLOBIN A1C
Hgb A1c MFr Bld: 7.9 % — ABNORMAL HIGH (ref 4.8–5.6)
Mean Plasma Glucose: 180.03 mg/dL

## 2021-07-22 NOTE — Progress Notes (Signed)
[facility] DEVICE PROGRAMMING  Patient Information: Name:  Leslie Mullen  DOB:  1961-06-25  MRN:  [id number]    Willeen Cass, RN  P Cv Div Heartcare Device Planned Procedure:  [facility] with IUD placement  Surgeon:  Everitt Amber  Date of Procedure:  07/29/21  Cautery will be used.  Position during surgery:  unknown   Please send documentation back to:  [facility] (Fax # [phone])   Willeen Cass, RN  06/23/2021 10:13 AM  Device Information:  Clinic EP Physician:  Dr. Lars Mage   Device Type:  Pacemaker Manufacturer and Phone #:  Medtronic: [phone] Pacemaker Dependent?:  No. Date of Last Device Check:  07/10/21 Normal Device Function?:  Yes.    Electrophysiologist's Recommendations:  Have magnet available. Provide continuous ECG monitoring when magnet is used or reprogramming is to be performed.  Procedure should not interfere with device function.  No device programming or magnet placement needed.  Per Device Clinic Standing Orders, York Ram, RN  1:51 PM 07/22/2021

## 2021-07-22 NOTE — Progress Notes (Addendum)
COVID swab appointment: N/a  COVID Vaccine Completed: yes x2 Date COVID Vaccine completed: Has received booster: COVID vaccine manufacturer: Pfizer       Date of COVID positive in last 90 days: No  PCP - Tereasa Coop, PA Cardiologist - Verlin Dike Electrophysiology- Lars Mage, MD  Clearance by Fabian Sharp 06/17/21 Epic  Chest x-ray - 06/15/21 Epic EKG - 06/20/21 Epic Stress Test -  ECHO - 10/11/20 Epic Cardiac Cath - 2017- Care Pacemaker/ICD device last checked: 07/10/21 Spinal Cord Stimulator: N/a  Sleep Study -  N/a CPAP -   Fasting Blood Sugar - 84-127 Checks Blood Sugar _3-4__ times a day Has glucose monitor, Freestyle Libre  Blood Thinner Instructions: N/a Aspirin Instructions: Last Dose:  Activity level: Can perform activities of daily living without stopping and without symptoms of chest pain or shortness of breath. Not able to do stairs do to knee arthritis. Endorses SOB from time to time with exertion.     Anesthesia review: recent hospitalization for CP, heart block-has pacemaker, DM 2, HTN  Patient denies shortness of breath, fever, cough and chest pain at PAT appointment   Patient verbalized understanding of instructions that were given to them at the PAT appointment. Patient was also instructed that they will need to review over the PAT instructions again at home before surgery.

## 2021-07-22 NOTE — Patient Instructions (Addendum)
DUE TO COVID-19 ONLY ONE VISITOR IS ALLOWED TO COME WITH YOU AND STAY IN THE WAITING ROOM ONLY DURING PRE OP AND PROCEDURE.   **NO VISITORS ARE ALLOWED IN THE SHORT STAY AREA OR RECOVERY ROOM!!**       Your procedure is scheduled on: 07/29/21   Report to [facility] Main  Entrance   Report to Short Stay at 5:15 AM   [facility])   Call this number if you have problems the morning of surgery [phone]   Do not eat food :After Midnight.   May have liquids until  4:30 AM day of surgery  CLEAR LIQUID DIET  Foods Allowed                                                                     Foods Excluded  Water, Black Coffee and tea, regular and decaf               liquids that you cannot  Plain Jell-O in any flavor  (No red)                                    see through such as: Fruit ices (not with fruit pulp)                                            milk, soups, orange juice              Iced Popsicles (No red)                                                All solid food                                   Apple juices Sports drinks like Gatorade (No red) Lightly seasoned clear broth or consume(fat free) Sugar, honey syrup    Oral Hygiene is also important to reduce your risk of infection.                                    Remember - BRUSH YOUR TEETH THE MORNING OF SURGERY WITH YOUR REGULAR TOOTHPASTE   Do NOT smoke after Midnight   Take these medicines the morning of surgery with A SIP OF WATER: Amlodipine, Gabapentin, Claritin, Provera, Prilosec, Zanaflex, Tramadol.   DO NOT TAKE ANY ORAL DIABETIC MEDICATIONS DAY OF YOUR SURGERY  How to Manage Your Diabetes Before and After Surgery  Why is it important to control my blood sugar before and after surgery? Improving blood sugar levels before and after surgery helps healing and can limit problems. A way of improving blood sugar control is eating a healthy diet by:  Eating less sugar and carbohydrates  Increasing  activity/exercise  Talking with your doctor  about reaching your blood sugar goals High blood sugars (greater than 180 mg/dL) can raise your risk of infections and slow your recovery, so you will need to focus on controlling your diabetes during the weeks before surgery. Make sure that the doctor who takes care of your diabetes knows about your planned surgery including the date and location.  How do I manage my blood sugar before surgery? Check your blood sugar at least 4 times a day, starting 2 days before surgery, to make sure that the level is not too high or low. Check your blood sugar the morning of your surgery when you wake up and every 2 hours until you get to the Short Stay unit. If your blood sugar is less than 70 mg/dL, you will need to treat for low blood sugar: Do not take insulin. Treat a low blood sugar (less than 70 mg/dL) with  cup of clear juice (cranberry or apple), 4 glucose tablets, OR glucose gel. Recheck blood sugar in 15 minutes after treatment (to make sure it is greater than 70 mg/dL). If your blood sugar is not greater than 70 mg/dL on recheck, call [phone] for further instructions. Report your blood sugar to the short stay nurse when you get to Short Stay.  If you are admitted to the hospital after surgery: Your blood sugar will be checked by the staff and you will probably be given insulin after surgery (instead of oral diabetes medicines) to make sure you have good blood sugar levels. The goal for blood sugar control after surgery is 80-180 mg/dL.   WHAT DO I DO ABOUT MY DIABETES MEDICATION?  Do not take oral diabetes medicines (pills) the morning of surgery.  THE DAY BEFORE SURGERY, take 40 units of evening dose of insulin Degludec. Do not take Synjardy.      THE MORNING OF SURGERY, do not take any diabetes medications.     Reviewed and Endorsed by [facility] Patient Education Committee, August 2015                               You may not have  any metal on your body including hair pins, jewelry, and body piercing             Do not wear make-up, lotions, powders, perfumes, or deodorant  Do not wear nail polish including gel and S&S, artificial/acrylic nails, or any other type of covering on natural nails including finger and toenails. If you have artificial nails, gel coating, etc. that needs to be removed by a nail salon please have this removed prior to surgery or surgery may need to be canceled/ delayed if the surgeon/ anesthesia feels like they are unable to be safely monitored.   Do not shave  48 hours prior to surgery.    Do not bring valuables to the hospital. [place].    Patients discharged the day of surgery will not be allowed to drive home.   Please read over the following fact sheets you were given: IF YOU HAVE QUESTIONS ABOUT YOUR PRE OP INSTRUCTIONS PLEASE CALL [facility] - Preparing for Surgery Before surgery, you can play an important role.  Because skin is not sterile, your skin needs to be as free of germs as possible.  You can reduce the number of germs  on your skin by washing with CHG (chlorahexidine gluconate) soap before surgery.  CHG is an antiseptic cleaner which kills germs and bonds with the skin to continue killing germs even after washing. Please DO NOT use if you have an allergy to CHG or antibacterial soaps.  If your skin becomes reddened/irritated stop using the CHG and inform your nurse when you arrive at Short Stay. Do not shave (including legs and underarms) for at least 48 hours prior to the first CHG shower.  You may shave your face/neck.  Please follow these instructions carefully:  1.  Shower with CHG Soap the night before surgery and the  morning of surgery.  2.  If you choose to wash your hair, wash your hair first as usual with your normal  shampoo.  3.  After you shampoo, rinse your hair and body thoroughly to remove  the shampoo.                             4.  Use CHG as you would any other liquid soap.  You can apply chg directly to the skin and wash.  Gently with a scrungie or clean washcloth.  5.  Apply the CHG Soap to your body ONLY FROM THE NECK DOWN.   Do   not use on face/ open                           Wound or open sores. Avoid contact with eyes, ears mouth and   genitals (private parts).                       Wash face,  Genitals (private parts) with your normal soap.             6.  Wash thoroughly, paying special attention to the area where your    surgery  will be performed.  7.  Thoroughly rinse your body with warm water from the neck down.  8.  DO NOT shower/wash with your normal soap after using and rinsing off the CHG Soap.                9.  Pat yourself dry with a clean towel.            10.  Wear clean pajamas.            11.  Place clean sheets on your bed the night of your first shower and do not  sleep with pets. Day of Surgery : Do not apply any lotions/deodorants the morning of surgery.  Please wear clean clothes to the hospital/surgery center.  FAILURE TO FOLLOW THESE INSTRUCTIONS MAY RESULT IN THE CANCELLATION OF YOUR SURGERY  PATIENT SIGNATURE_________________________________  NURSE SIGNATURE__________________________________  ________________________________________________________________________

## 2021-07-24 NOTE — Anesthesia Preprocedure Evaluation (Addendum)
Anesthesia Evaluation  Patient identified by MRN, date of birth, ID band Patient awake    Reviewed: Allergy & Precautions, NPO status , Patient's Chart, lab work & pertinent test results  History of Anesthesia Complications Negative for: history of anesthetic complications  Airway Mallampati: III  TM Distance: >3 FB Neck ROM: Full    Dental  (+) Teeth Intact, Dental Advisory Given   Pulmonary asthma , former smoker,    Pulmonary exam normal        Cardiovascular hypertension, Pt. on medications (-) CAD (neg cath 2017) Normal cardiovascular exam+ dysrhythmias (CHB) + pacemaker      Neuro/Psych negative neurological ROS     GI/Hepatic Neg liver ROS, GERD  ,  Endo/Other  diabetes, Type 2, Insulin Dependent, Oral Hypoglycemic AgentsMorbid obesity  Renal/GU Renal InsufficiencyRenal disease (CKD 3a, Cr 1.34)  negative genitourinary   Musculoskeletal  (+) Arthritis ,   Abdominal   Peds  Hematology  (+) anemia ,   Anesthesia Other Findings  Echo 10/10/20: EF 65-70%, mod LVH, g1dd, normal RV function/PASP, normal MV/AV  Reproductive/Obstetrics Endometrial Cancer                          Anesthesia Physical Anesthesia Plan  ASA: 3  Anesthesia Plan: General   Post-op Pain Management:    Induction: Intravenous  PONV Risk Score and Plan: 4 or greater and Ondansetron, Dexamethasone, Midazolam and Treatment may vary due to age or medical condition  Airway Management Planned: LMA  Additional Equipment: None  Intra-op Plan:   Post-operative Plan: Extubation in OR  Informed Consent: I have reviewed the patients History and Physical, chart, labs and discussed the procedure including the risks, benefits and alternatives for the proposed anesthesia with the patient or authorized representative who has indicated his/her understanding and acceptance.     Dental advisory given  Plan Discussed  with:   Anesthesia Plan Comments: (Magnet available)      Anesthesia Quick Evaluation

## 2021-07-24 NOTE — Progress Notes (Signed)
Anesthesia Chart Review:   Case: [id number] Date/Time: 07/29/21 0715   Procedures:      DILATATION AND CURETTAGE OF UTERUS     INTRAUTERINE DEVICE (IUD) INSERTION - IUD FROM MAIN PHARMACY   Anesthesia type: General   Pre-op diagnosis: ENDOMETRIAL CANCER   Location: [facility] ROOM 03 / [facility]   Surgeons: Everitt Amber, MD       DISCUSSION: Pt is 60 years old with hx complete heart block (PPM implanted 10/11/20), HTN, DM, asthma, anemia  Perioperative prescription for pacemaker:  Have magnet available. Provide continuous ECG monitoring when magnet is used or reprogramming is to be performed.  Procedure should not interfere with device function.  No device programming or magnet placement needed.   VS: BP (!) 187/77 Comment: has not taaken her B/P meddication today  Pulse 89   Temp 36.9 C (Oral)   Resp 18   Ht '5\' 8"'$  (1.727 m)   Wt (!) 157.1 kg   SpO2 100%   BMI 52.65 kg/m   PROVIDERS: - PCP is Elinor Parkinson - EP cardiologist is Lars Mage, MD. Last office visit 01/21/21. Cleared for surgery 06/17/21 by Fabian Sharp, PA   LABS: Labs reviewed: Acceptable for surgery. (all labs ordered are listed, but only abnormal results are displayed)  Labs Reviewed  CBC - Abnormal; Notable for the following components:      Result Value   WBC 11.2 (*)    Hemoglobin 11.9 (*)    All other components within normal limits  BASIC METABOLIC PANEL - Abnormal; Notable for the following components:   BUN 21 (*)    Creatinine, Ser 1.34 (*)    Calcium 8.7 (*)    GFR, Estimated 46 (*)    All other components within normal limits  HEMOGLOBIN A1C - Abnormal; Notable for the following components:   Hgb A1c MFr Bld 7.9 (*)    All other components within normal limits  GLUCOSE, CAPILLARY     IMAGES: CXR 06/15/21: No active cardiopulmonary disease   EKG 06/15/21: NSR. Cannot rule out anterior infarct, age undetermined   CV: Echo 10/5420:  1. Left ventricular ejection fraction, by  estimation, is 65 to 70%. The left ventricle has normal function. The left ventricle has no regional wall motion abnormalities. There is moderate concentric left ventricular hypertrophy. Left ventricular  diastolic parameters are consistent with Grade I diastolic dysfunction (impaired relaxation). Elevated left atrial pressure.   2. Right ventricular systolic function is normal. The right ventricular size is normal. There is normal pulmonary artery systolic pressure.   3. The mitral valve is normal in structure. No evidence of mitral valve regurgitation. No evidence of mitral stenosis.   4. The aortic valve is normal in structure. Aortic valve regurgitation is not visualized. No aortic stenosis is present.   5. The inferior vena cava is normal in size with greater than 50% respiratory variability, suggesting right atrial pressure of 3 mmHg.    Past Medical History:  Diagnosis Date   Abnormal uterine bleeding    Allergy    Anemia    Asthma    Cataract    BOTH EYES   Complete heart block ([place])    a. Medtr[employer] PPM 10/2020.   Diabetes ([place])    type II    Diabetic neuropathy [place])    Endometrial cancer ([place])    GERD (gastroesophageal reflux disease)    Hyperlipidemia    Hypertension    Morbid obesity ([place])    Osteoarthritis  right knee    Pneumonia     Past Surgical History:  Procedure Laterality Date   CERVICAL BIOPSY     CESAREAN SECTION     x3   COLONOSCOPY WITH PROPOFOL N/A 06/29/2017   Procedure: COLONOSCOPY WITH PROPOFOL;  Surgeon: Irene Shipper, MD;  Location: [facility];  Service: Endoscopy;  Laterality: N/A;   CYSTECTOMY Right 2010   Cyst removed from right groin.    PACEMAKER IMPLANT N/A 10/11/2020   Procedure: PACEMAKER IMPLANT;  Surgeon: Vickie Epley, MD;  Location: [facility];  Service: Cardiovascular;  Laterality: N/A;   TONSILLECTOMY      MEDICATIONS:  amLODipine (NORVASC) 10 MG tablet   gabapentin (NEURONTIN) 300 MG capsule   glucose blood  test strip   ibuprofen (ADVIL) 200 MG tablet   insulin degludec (TRESIBA FLEXTOUCH) 200 UNIT/ML FlexTouch Pen   Insulin Pen Needle 31G X 5 MM MISC   lisinopril (ZESTRIL) 20 MG tablet   loratadine (CLARITIN) 10 MG tablet   medroxyPROGESTERone (PROVERA) 10 MG tablet   omeprazole (PRILOSEC) 20 MG capsule   ONETOUCH DELICA LANCETS 99991111 MISC   OZEMPIC, 0.25 OR 0.5 MG/DOSE, 2 MG/1.5ML SOPN   polyethylene glycol (MIRALAX / GLYCOLAX) 17 g packet   pravastatin (PRAVACHOL) 40 MG tablet   SYNJARDY XR 12.04-999 MG TB24   tiZANidine (ZANAFLEX) 4 MG tablet   traMADol (ULTRAM) 50 MG tablet   trolamine salicylate (ASPERCREME) 10 % cream   No current facility-administered medications for this encounter.    If no changes, I anticipate pt can proceed with surgery as scheduled.   Willeen Cass, PhD, FNP-BC [facility]/Anesthesiology Phone: [phone] 07/24/2021 10:49 AM

## 2021-07-28 ENCOUNTER — Telehealth: Payer: Self-pay

## 2021-07-28 NOTE — Telephone Encounter (Signed)
Telephone call to check on pre-operative status.  Patient compliant with pre-operative instructions.  Reinforced NPO after midnight.  No questions or concerns voiced.  Instructed to call for any needs.   

## 2021-07-29 ENCOUNTER — Encounter (HOSPITAL_COMMUNITY)
Admission: RE | Disposition: A | Discharge status: Home / Self Care | Payer: Self-pay | Source: Home / Self Care | Attending: Gynecologic Oncology

## 2021-07-29 ENCOUNTER — Ambulatory Visit (HOSPITAL_COMMUNITY): Payer: 59 | Admitting: Anesthesiology

## 2021-07-29 ENCOUNTER — Ambulatory Visit (HOSPITAL_COMMUNITY)
Admission: RE | Admit: 2021-07-29 | Discharge: 2021-07-29 | Disposition: A | Discharge status: Home / Self Care | Payer: 59 | Attending: Gynecologic Oncology | Admitting: Gynecologic Oncology

## 2021-07-29 ENCOUNTER — Ambulatory Visit (HOSPITAL_COMMUNITY): Payer: 59 | Admitting: Emergency Medicine

## 2021-07-29 ENCOUNTER — Encounter (HOSPITAL_COMMUNITY): Payer: Self-pay | Admitting: Gynecologic Oncology

## 2021-07-29 DIAGNOSIS — Z794 Long term (current) use of insulin: Secondary | ICD-10-CM | POA: Insufficient documentation

## 2021-07-29 DIAGNOSIS — E1169 Type 2 diabetes mellitus with other specified complication: Secondary | ICD-10-CM | POA: Insufficient documentation

## 2021-07-29 DIAGNOSIS — Z880 Allergy status to penicillin: Secondary | ICD-10-CM | POA: Diagnosis not present

## 2021-07-29 DIAGNOSIS — Z7984 Long term (current) use of oral hypoglycemic drugs: Secondary | ICD-10-CM | POA: Diagnosis not present

## 2021-07-29 DIAGNOSIS — Z6841 Body Mass Index (BMI) 40.0 and over, adult: Secondary | ICD-10-CM | POA: Insufficient documentation

## 2021-07-29 DIAGNOSIS — C541 Malignant neoplasm of endometrium: Secondary | ICD-10-CM

## 2021-07-29 DIAGNOSIS — Z800 Family history of malignant neoplasm of digestive organs: Secondary | ICD-10-CM | POA: Diagnosis not present

## 2021-07-29 DIAGNOSIS — Z833 Family history of diabetes mellitus: Secondary | ICD-10-CM | POA: Diagnosis not present

## 2021-07-29 DIAGNOSIS — Z8249 Family history of ischemic heart disease and other diseases of the circulatory system: Secondary | ICD-10-CM | POA: Insufficient documentation

## 2021-07-29 DIAGNOSIS — Z79899 Other long term (current) drug therapy: Secondary | ICD-10-CM | POA: Insufficient documentation

## 2021-07-29 DIAGNOSIS — Z87891 Personal history of nicotine dependence: Secondary | ICD-10-CM | POA: Diagnosis not present

## 2021-07-29 DIAGNOSIS — Z791 Long term (current) use of non-steroidal anti-inflammatories (NSAID): Secondary | ICD-10-CM | POA: Diagnosis not present

## 2021-07-29 HISTORY — PX: DILATION AND CURETTAGE OF UTERUS: SHX78

## 2021-07-29 HISTORY — PX: INTRAUTERINE DEVICE (IUD) INSERTION: SHX5877

## 2021-07-29 LAB — GLUCOSE, CAPILLARY
Glucose-Capillary: 167 mg/dL — ABNORMAL HIGH (ref 70–99)
Glucose-Capillary: 196 mg/dL — ABNORMAL HIGH (ref 70–99)

## 2021-07-29 SURGERY — DILATION AND CURETTAGE
Anesthesia: General | Site: Uterus

## 2021-07-29 MED ORDER — DEXAMETHASONE SODIUM PHOSPHATE 10 MG/ML IJ SOLN
INTRAMUSCULAR | Status: AC
  Filled 2021-07-29: qty 1

## 2021-07-29 MED ORDER — LIDOCAINE 2% (20 MG/ML) 5 ML SYRINGE
INTRAMUSCULAR | Status: DC | PRN
  Administered 2021-07-29: 100 mg via INTRAVENOUS

## 2021-07-29 MED ORDER — FENTANYL CITRATE (PF) 100 MCG/2ML IJ SOLN
INTRAMUSCULAR | Status: AC
  Filled 2021-07-29: qty 2

## 2021-07-29 MED ORDER — OXYCODONE HCL 5 MG PO TABS: 5.0000 mg | ORAL_TABLET | Freq: Once | ORAL | Status: DC | PRN

## 2021-07-29 MED ORDER — LIDOCAINE 2% (20 MG/ML) 5 ML SYRINGE
INTRAMUSCULAR | Status: AC
  Filled 2021-07-29: qty 5

## 2021-07-29 MED ORDER — MIDAZOLAM HCL 5 MG/5ML IJ SOLN
INTRAMUSCULAR | Status: DC | PRN
  Administered 2021-07-29: 2 mg via INTRAVENOUS

## 2021-07-29 MED ORDER — PROPOFOL 10 MG/ML IV BOLUS
INTRAVENOUS | Status: AC
  Filled 2021-07-29: qty 20

## 2021-07-29 MED ORDER — ONDANSETRON HCL 4 MG/2ML IJ SOLN
INTRAMUSCULAR | Status: AC
  Filled 2021-07-29: qty 2

## 2021-07-29 MED ORDER — LEVONORGESTREL 20 MCG/DAY IU IUD
1.0000 | INTRAUTERINE_SYSTEM | INTRAUTERINE | Status: AC
Start: 2021-07-29 — End: 2021-07-29
  Administered 2021-07-29: 1 via INTRAUTERINE
  Filled 2021-07-29: qty 1

## 2021-07-29 MED ORDER — ONDANSETRON HCL 4 MG/2ML IJ SOLN
INTRAMUSCULAR | Status: DC | PRN
  Administered 2021-07-29: 4 mg via INTRAVENOUS

## 2021-07-29 MED ORDER — FENTANYL CITRATE (PF) 100 MCG/2ML IJ SOLN
INTRAMUSCULAR | Status: DC | PRN
  Administered 2021-07-29 (×2): 50 ug via INTRAVENOUS

## 2021-07-29 MED ORDER — ROCURONIUM BROMIDE 10 MG/ML (PF) SYRINGE
PREFILLED_SYRINGE | INTRAVENOUS | Status: AC
  Filled 2021-07-29: qty 10

## 2021-07-29 MED ORDER — SILVER NITRATE-POT NITRATE 75-25 % EX MISC
CUTANEOUS | Status: AC
  Filled 2021-07-29: qty 10

## 2021-07-29 MED ORDER — LACTATED RINGERS IV SOLN: INTRAVENOUS | Status: DC

## 2021-07-29 MED ORDER — OXYCODONE HCL 5 MG/5ML PO SOLN: 5.0000 mg | Freq: Once | ORAL | Status: DC | PRN

## 2021-07-29 MED ORDER — SUCCINYLCHOLINE CHLORIDE 200 MG/10ML IV SOSY
PREFILLED_SYRINGE | INTRAVENOUS | Status: AC
  Filled 2021-07-29: qty 10

## 2021-07-29 MED ORDER — FENTANYL CITRATE (PF) 100 MCG/2ML IJ SOLN: 25.0000 ug | INTRAMUSCULAR | Status: DC | PRN

## 2021-07-29 MED ORDER — MIDAZOLAM HCL 2 MG/2ML IJ SOLN
INTRAMUSCULAR | Status: AC
  Filled 2021-07-29: qty 2

## 2021-07-29 MED ORDER — ONDANSETRON HCL 4 MG/2ML IJ SOLN: 4.0000 mg | Freq: Once | INTRAMUSCULAR | Status: DC | PRN

## 2021-07-29 MED ORDER — SCOPOLAMINE 1 MG/3DAYS TD PT72
1.0000 | MEDICATED_PATCH | TRANSDERMAL | Status: DC
  Administered 2021-07-29: 1.5 mg via TRANSDERMAL
  Filled 2021-07-29: qty 1

## 2021-07-29 MED ORDER — DEXAMETHASONE SODIUM PHOSPHATE 10 MG/ML IJ SOLN
INTRAMUSCULAR | Status: DC | PRN
  Administered 2021-07-29: 5 mg via INTRAVENOUS

## 2021-07-29 MED ORDER — CHLORHEXIDINE GLUCONATE 0.12 % MT SOLN
15.0000 mL | Freq: Once | OROMUCOSAL | Status: AC
  Administered 2021-07-29: 15 mL via OROMUCOSAL

## 2021-07-29 MED ORDER — LIDOCAINE HCL (PF) 1 % IJ SOLN
INTRAMUSCULAR | Status: AC
  Filled 2021-07-29: qty 30

## 2021-07-29 MED ORDER — PROPOFOL 10 MG/ML IV BOLUS
INTRAVENOUS | Status: DC | PRN
  Administered 2021-07-29: 200 mg via INTRAVENOUS

## 2021-07-29 MED ORDER — ACETAMINOPHEN 500 MG PO TABS
1000.0000 mg | ORAL_TABLET | ORAL | Status: AC
Start: 2021-07-29 — End: 2021-07-29
  Administered 2021-07-29: 1000 mg via ORAL
  Filled 2021-07-29: qty 2

## 2021-07-29 MED ORDER — AMISULPRIDE (ANTIEMETIC) 5 MG/2ML IV SOLN: 10.0000 mg | Freq: Once | INTRAVENOUS | Status: DC | PRN

## 2021-07-29 MED ORDER — EPHEDRINE SULFATE-NACL 50-0.9 MG/10ML-% IV SOSY
PREFILLED_SYRINGE | INTRAVENOUS | Status: DC | PRN
  Administered 2021-07-29: 5 mg via INTRAVENOUS

## 2021-07-29 SURGICAL SUPPLY — 23 items
BAG COUNTER SPONGE SURGICOUNT (BAG) IMPLANT
CATH ROBINSON RED A/P 16FR (CATHETERS) ×2 IMPLANT
COVER SURGICAL LIGHT HANDLE (MISCELLANEOUS) ×2 IMPLANT
DRAPE SHEET LG 3/4 BI-LAMINATE (DRAPES) ×2 IMPLANT
DRAPE UNDERBUTTOCKS STRL (DISPOSABLE) ×2 IMPLANT
DRSG TELFA 3X8 NADH (GAUZE/BANDAGES/DRESSINGS) ×2 IMPLANT
GAUZE 4X4 16PLY ~~LOC~~+RFID DBL (SPONGE) ×2 IMPLANT
GLOVE SURG ENC MOIS LTX SZ6 (GLOVE) ×4 IMPLANT
GOWN STRL REUS W/ TWL LRG LVL3 (GOWN DISPOSABLE) ×2 IMPLANT
GOWN STRL REUS W/TWL LRG LVL3 (GOWN DISPOSABLE) ×2
KIT BASIN OR (CUSTOM PROCEDURE TRAY) ×2 IMPLANT
KIT TURNOVER KIT A (KITS) ×2 IMPLANT
Mirena ×2 IMPLANT
NEEDLE SPNL 22GX3.5 QUINCKE BK (NEEDLE) ×2 IMPLANT
NS IRRIG 1000ML POUR BTL (IV SOLUTION) ×2 IMPLANT
PACK LITHOTOMY IV (CUSTOM PROCEDURE TRAY) ×2 IMPLANT
PAD OB MATERNITY 4.3X12.25 (PERSONAL CARE ITEMS) ×2 IMPLANT
PENCIL SMOKE EVACUATOR (MISCELLANEOUS) IMPLANT
SOL PREP PROV IODINE SCRUB 4OZ (MISCELLANEOUS) ×2 IMPLANT
SURGILUBE 2OZ TUBE FLIPTOP (MISCELLANEOUS) ×2 IMPLANT
TOWEL OR 17X26 10 PK STRL BLUE (TOWEL DISPOSABLE) ×2 IMPLANT
TOWEL OR NON WOVEN STRL DISP B (DISPOSABLE) ×2 IMPLANT
UNDERPAD 30X36 HEAVY ABSORB (UNDERPADS AND DIAPERS) ×4 IMPLANT

## 2021-07-29 NOTE — Op Note (Signed)
OPERATIVE NOTE  PATIENT: Leslie Mullen DATE: 07/29/21   Preop Diagnosis: endometrial cancer  Postoperative Diagnosis: same  Surgery: D&C (dilation and curettage) and placement of progestin-releasing IUD  Surgeons:  Donaciano Eva, MD Assistant: none  Anesthesia: General   Estimated blood loss: <1m  IVF:  2093m  Urine output: [id number]   Complications: None   Pathology: endometrial curetteings  Operative findings: uterus sounded to 9cm, anteverted, grossly normal appearing cervix.  Procedure: The patient was identified in the preoperative holding area. Informed consent was signed on the chart. Patient was seen history was reviewed and exam was performed.   The patient was then taken to the operating room and placed in the supine position with SCD hose on. General anesthesia was then induced without difficulty. She was then placed in the dorsolithotomy position. The perineum was prepped with Betadine. The vagina was prepped with Betadine. The patient was then draped after the prep was dried. A in and out catheter to empty the bladder was performed under aseptic conditions.  Timeout was performed the patient, procedure, antibiotic, allergy, and length of procedure.   The weighted speculum was placed in the posterior vagina. The single tooth tenaculum was placed on the anterior lip of the cervix. The uterine sound was placed in the cervix and advanced to the fundus. The cervix was successively dilated using pratts dilators to accommodate a uterine curette. A sharp curette was advanced to the fundus and a comprehensive curette of the endometrial cavity took place until a gritty feel was appreciated. The specimen was collected on a telfa and sent for permanent pathology.  The Mirena IUD ([id number] #[id number]) was deployed to the uterine fundus and released. Strings were cut to 3 inches from the external os.   The tenaculum was removed and hemostasis was  observed.   The vagina was irrigated.  All instrument, suture, laparotomy, Ray-Tec, and needle counts were correct x2. The patient tolerated the procedure well and was taken recovery room in stable condition. This is [name] an operative note on [name]

## 2021-07-29 NOTE — H&P (Signed)
H&P Note: Gyn-Onc  Consult was requested by Dr. Charlesetta Garibaldi for the evaluation of Maven Vencill 59 y.o. female  CC:  Endometrial cancer   Assessment/Plan:  Ms. Mikeria Schipper  is a 60 y.o.  year old P3 with grade 1 endometrial cancer. She is not a candidate for hysterectomy due to her very poorly controlled diabetes and medical comorbidities.   An alternative to hysterectomy would be D&C and Mirena IUD placement.  If she has this performed I explained the 60 to 80% chance of progression of the cancer.  I explained that she will need repeat sampling in the office at 3 months to confirm regression.  I feel that this could be easily accomplished in the office given the easy visualization of her cervix on speculum exam.  After counseling and consideration of her options, she is in agreement to proceed with D&C IUD.  She will need repeat sampling 3 months after IUD placement to confirm resolution of malignancy.      HPI: Ms Magaby Beegle is a 60 year old P3 who was seen in consultation at the request of Dr Charlesetta Garibaldi for evaluation and treatment of grade 1 endometrial cancer.   Her symptoms began in August 2019 with postmenopausal bleeding.  She underwent an ultrasound at that time did show a uterus measuring 4.8 x 3.7 x 5.2 cm with a 5 mm endometrial stripe.  She subsequently underwent endometrial sampling which showed complex hyperplasia with atypia.  A planned hysterectomy was scheduled at that time however needed to be aborted when hemoglobin A1c testing in February 2020 was elevated at 9.5%.  The patient at that time did not have health insurance coverage for her medications.  She was started on Megace progesterone which she believes she took for approximately 3 months.  This stopped her vaginal bleeding and she discontinued it due to lack of insurance coverage of medications.  She subsequently continued to have poor diabetes control with a hemoglobin A1c in January 2021 measuring 13%.  Following that  measures she was seen by an endocrinologist and started on multiple diabetes medications including Tresiba and Ozempic.  She is somewhat better financial coverage of her diabetes medication and is for the most part fairly compliant with them.  She began having postmenopausal bleeding in the summer 2021.  She returned to see Dr. Charlesetta Garibaldi who performed a transvaginal ultrasound scan on July 23, 2020.  It showed a uterus measuring 4.1 x 4.5 x 3.9 cm with an endometrial thickness of 14 mm.  She was started on progestin therapy.  A Pap test was performed on July 01, 2020 which was normal.  She underwent endometrial sampling with Pipelle in the office on September 11, 2020 which showed a well differentiated endometrioid adenocarcinoma.  Her medical history is most remarkable for morbid obesity with a BMI of 55 kg meters squared.  She has type 2 diabetes mellitus for which she takes insulin and other medications.  She has hypertension.  Her surgical history is remarkable for 3 prior cesarean sections.  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  She lives with her husband.  Family history is remarkable for a mother who died from colon cancer greater than the age of 93.  Interval Hx:  In October, 2021 she presented for [facility] and IUD (due to very elevated HbA1c precluding hysterectomy as appropriate option). She had a presyncopal event in preop and her procedure was canceled pending optimization and risk stratification by cardiology and endocrinology.  Current Meds:  Outpatient Encounter Medications as of 09/23/2020  Medication Sig   amLODipine (NORVASC) 10 MG tablet TAKE 1 TABLET(10 MG) BY MOUTH AT BEDTIME   BIOTIN PO Take by mouth.   CINNAMON PO Take 1,000 mg by mouth 2 (two) times daily.    cyclobenzaprine (FLEXERIL) 5 MG tablet TAKE 1 TABLET BY MOUTH THREE TIMES DAILY AS NEEDED FOR MUSCLE SPASMS( START EVERY NIGHT AT BEDTIME AS NEEDED DUE TO SEDATION)   gabapentin  (NEURONTIN) 300 MG capsule TAKE 1 CAPSULE(300 MG) BY MOUTH THREE TIMES DAILY   glucose blood (ONE TOUCH ULTRA TEST) test strip    ibuprofen (ADVIL) 800 MG tablet Take 1 tablet (800 mg total) by mouth every 8 (eight) hours as needed.   Insulin Degludec (TRESIBA FLEXTOUCH) 200 UNIT/ML SOPN Inject 70 Units into the muscle every evening.   Insulin Pen Needle (B-D UF III MINI PEN NEEDLES) 31G X 5 MM MISC    JARDIANCE 25 MG TABS tablet TAKE 1 TABLET BY MOUTH DAILY   lisinopril-hydrochlorothiazide (ZESTORETIC) 20-25 MG tablet TAKE 1 TABLET BY MOUTH DAILY   metFORMIN (GLUCOPHAGE) 1000 MG tablet TAKE 1 TABLET(1000 MG) BY MOUTH TWICE DAILY WITH A MEAL   Misc Natural Products (OSTEO BI-FLEX ADV DOUBLE ST PO) Take 1 tablet by mouth 2 (two) times daily.   Misc. Devices MISC    Multiple Vitamins-Minerals (MULTIVITAMIN WITH MINERALS) tablet Take 1 tablet by mouth daily.   Omega-3 Fatty Acids (FISH OIL) 1000 MG CAPS Take 1,000 mg by mouth 2 (two) times daily.   omeprazole (PRILOSEC) 20 MG capsule TAKE 1 CAPSULE(20 MG) BY MOUTH AT BEDTIME   ONETOUCH DELICA LANCETS [id number] MISC    OZEMPIC, 0.25 OR 0.5 MG/DOSE, 2 MG/1.5ML SOPN INJECT 0.5 MG INTO THE SKIN ONCE A WEEK   pravastatin (PRAVACHOL) 40 MG tablet TAKE 1 TABLET(40 MG) BY MOUTH DAILY   senna-docusate (SENOKOT-S) 8.6-50 MG tablet Take 2 tablets by mouth at bedtime. For AFTER surgery, hold if having loose stools   traMADol (ULTRAM) 50 MG tablet TAKE 1 TABLET(50 MG) BY MOUTH TWICE DAILY AS NEEDED   No facility-administered encounter medications on file as of 09/23/2020.    Allergy:  Allergies  Allergen Reactions   Penicillins Shortness Of Breath, Itching and Swelling    Has patient had a PCN reaction causing immediate rash, facial/tongue/throat swelling, SOB or lightheadedness with hypotension: Yes Has patient had a PCN reaction causing severe rash involving mucus membranes or skin necrosis:No Has patient had a PCN reaction that required hospitalization:  No Has patient had a PCN reaction occurring within the last 10 years: no If all of the above answers are "NO", then may proceed with Cephalosporin use.      Social Hx:   Social History   Socioeconomic History   Marital status: Married    Spouse name: Not on file   Number of children: 2   Years of education: Not on file   Highest education level: Not on file  Occupational History   Occupation: Intake Coordinator  Tobacco Use   Smoking status: Former    Types: Cigarettes   Smokeless tobacco: Never   Tobacco comments:    18 years ago.  Vaping Use   Vaping Use: Never used  Substance and Sexual Activity   Alcohol use: No    Alcohol/week: 0.0 standard drinks   Drug use: No   Sexual activity: Not on file  Other Topics Concern   Not on file  Social History Narrative  Not on file   Social Determinants of Health   Financial Resource Strain: Not on file  Food Insecurity: Not on file  Transportation Needs: Not on file  Physical Activity: Not on file  Stress: Not on file  Social Connections: Not on file  Intimate Partner Violence: Not on file    Past Surgical Hx:  Past Surgical History:  Procedure Laterality Date   CERVICAL BIOPSY     CESAREAN SECTION     x3   COLONOSCOPY WITH PROPOFOL N/A 06/29/2017   Procedure: COLONOSCOPY WITH PROPOFOL;  Surgeon: Irene Shipper, MD;  Location: [facility];  Service: Endoscopy;  Laterality: N/A;   CYSTECTOMY Right 2010   Cyst removed from right groin.    PACEMAKER IMPLANT N/A 10/11/2020   Procedure: PACEMAKER IMPLANT;  Surgeon: Vickie Epley, MD;  Location: [facility];  Service: Cardiovascular;  Laterality: N/A;   TONSILLECTOMY      Past Medical Hx:  Past Medical History:  Diagnosis Date   Abnormal uterine bleeding    Allergy    Anemia    Asthma    Cataract    BOTH EYES   Complete heart block ([place])    a. Medtronic PPM 10/2020.   Diabetes ([place])    type II    Diabetic neuropathy ([facility])    Endometrial cancer  ([place])    GERD (gastroesophageal reflux disease)    Hyperlipidemia    Hypertension    Morbid obesity ([place])    Osteoarthritis    right knee    Pneumonia     Past Gynecological History:  Cesarean section x 3,  No LMP recorded. Patient is postmenopausal.  Family Hx:  Family History  Adopted: Yes  Problem Relation Age of Onset   Diabetes Father    Heart disease Father    Cancer Mother        colon   Colon cancer Neg Hx    Esophageal cancer Neg Hx    Rectal cancer Neg Hx    Stomach cancer Neg Hx     Review of Systems:  Constitutional  Feels well,    ENT Normal appearing ears and nares bilaterally Skin/Breast  No rash, sores, jaundice, itching, dryness Cardiovascular  No chest pain, shortness of breath, or edema  Pulmonary  No cough or wheeze.  Gastro Intestinal  No nausea, vomitting, or diarrhoea. No bright red blood per rectum, no abdominal pain, change in bowel movement, or constipation.  Genito Urinary  No frequency, urgency, dysuria, + postmenopausal bleeding Musculo Skeletal  No myalgia, arthralgia, joint swelling or pain  Neurologic  No weakness, numbness, change in gait,  Psychology  No depression, anxiety, insomnia.   Vitals:  Blood pressure (!) 180/70, pulse 87, temperature (!) 97.4 F (36.3 C), temperature source Oral, resp. rate 16, height '5\' 8"'$  (1.727 m), weight (!) 346 lb 5.5 oz (157.1 kg), SpO2 98 %.  Physical Exam: WD in NAD Neck  Supple NROM, without any enlargements.  Lymph Node Survey No cervical supraclavicular or inguinal adenopathy Cardiovascular  Pulse normal rate, regularity and rhythm. S1 and S2 normal.  Lungs  Clear to auscultation bilateraly, without wheezes/crackles/rhonchi. Good air movement.  Skin  No rash/lesions/breakdown  Psychiatry  Alert and oriented to person, place, and time  Abdomen  Normoactive bowel sounds, abdomen soft, non-tender and obese without evidence of hernia. Large abdominal pannus, relatively smaller  profile upper abdomen.  Back No CVA tenderness Genito Urinary  Vulva/vagina: Normal external female genitalia.  No lesions. No  discharge or bleeding.  Bladder/urethra:  No lesions or masses, well supported bladder  Vagina: normal  Cervix: Normal appearing, no lesions. Easily visualized with pederson speculum.  Uterus:  Small, mobile, no parametrial involvement or nodularity.  Adnexa: no palpable masses. Rectal  deferred Extremities  No bilateral cyanosis, clubbing or edema.   Thereasa Solo, MD  07/29/2021, 7:04 AM

## 2021-07-29 NOTE — Transfer of Care (Signed)
Immediate Anesthesia Transfer of Care Note  Patient: Leslie Mullen  Procedure(s) Performed: DILATATION AND CURETTAGE OF UTERUS (Uterus) INTRAUTERINE DEVICE (IUD) INSERTION (Uterus)  Patient Location: PACU  Anesthesia Type:General  Level of Consciousness: awake, alert  and oriented  Airway & Oxygen Therapy: Patient Spontanous Breathing and Patient connected to face mask  Post-op Assessment: Report given to RN and Post -op Vital signs reviewed and stable  Post vital signs: Reviewed and stable  Last Vitals:  Vitals Value Taken Time  BP 160/83 07/29/21 0805  Temp    Pulse 81 07/29/21 0808  Resp 14 07/29/21 0808  SpO2 99 % 07/29/21 0808  Vitals shown include unvalidated device data.  Last Pain:  Vitals:   07/29/21 0608  TempSrc:   PainSc: 0-No pain         Complications: No notable events documented.

## 2021-07-29 NOTE — Discharge Instructions (Signed)
D&C Care After MEDICATION Take ibuprofen ('400mg'$  every 4 hours as needed) for cramping pains.   ACTIVITY Rest as much as possible the first day after discharge. You do not have weight restrictions on lifting Avoid strenuous working out such as running or lifting weights for 48 hours You can climb stairs and drive a car NUTRITION You may resume your normal diet. Drink 6 to 8 glasses of fluids a day. Eat a healthy, balanced diet including portions of food from the meat (protein), milk, fruit, vegetable, and bread groups. Your caregiver may recommend you take a multivitamin with iron.  ELIMINATION  If constipation occurs, drink more liquids, and add more fruits, vegetables, and bran to your diet. You may take a mild laxative, such as Milk of Magnesia, Metamucil, or a stool softener such as Colace, with permission from your caregiver.  HYGIENE You may shower and wash your hair. Avoid tub baths for 4 weeks Do not add any bath oils or chemicals to your bath water, after you have permission to take baths. Avoid placing anything in the vagina for 2 weeks. It is normal to pass some blood (less than a period type bleeding) for up to a month  [place]  Take your temperature twice a day and record it, especially if you feel feverish or have chills. Follow your caregiver's instructions about medicines, activity, and follow-up appointments after surgery. Do not drink alcohol while taking pain medicine. You may take over-the-counter medicine for pain, recommended by your caregiver. If your pain is not relieved with medicine, call your caregiver. Do not take aspirin because it can cause bleeding. Do not douche or use tampons (use a nonperfumed sanitary pad). Do not have sexual intercourse for 4 weeks postoperatively. Hugging, kissing, and playful sexual activity is fine with your caregiver's permission. Take showers instead of baths, until your caregiver gives you permission to take  baths. You may take a mild medicine for constipation, recommended by your caregiver. Bran foods and drinking a lot of fluids will help with constipation. Make sure your family understands everything about your operation and recovery.  SEEK MEDICAL CARE IF:   You notice a foul smell coming from the vagina. You have painful or bloody urination. You develop nausea and vomiting. You develop diarrhea. You develop a rash. You have a reaction or allergy from the medicine. You feel dizzy or light-headed. You need stronger pain medicine.  SEEK IMMEDIATE MEDICAL CARE IF:  You develop a temperature of 102 F (38.9 C) or higher. You pass out. You develop leg or chest pain. You develop abdominal pain. You develop shortness of breath. You bleed heavier than a period (soaking through 2 or more pads per hour for 2 hours in a row). You see pus in the wound area.  MAKE SURE YOU:  Understand these instructions. Will watch your condition. Will get help right away if you are not doing well or get worse. Document Released: 07/07/2004 Document Revised: 04/09/2014 Document Reviewed: 10/25/2009 [facility] Patient Information 2015 [place], [place]. This information is not intended to replace advice given to you by your health care provider. Make sure you discuss any questions you have with your health care provider.

## 2021-07-29 NOTE — Anesthesia Postprocedure Evaluation (Signed)
Anesthesia Post Note  Patient: Leslie Mullen  Procedure(s) Performed: DILATATION AND CURETTAGE OF UTERUS (Uterus) INTRAUTERINE DEVICE (IUD) INSERTION (Uterus)     Patient location during evaluation: PACU Anesthesia Type: General Level of consciousness: awake and alert Pain management: pain level controlled Vital Signs Assessment: post-procedure vital signs reviewed and stable Respiratory status: spontaneous breathing, nonlabored ventilation and respiratory function stable Cardiovascular status: blood pressure returned to baseline and stable Postop Assessment: no apparent nausea or vomiting Anesthetic complications: no   No notable events documented.  Last Vitals:  Vitals:   07/29/21 0815 07/29/21 0830  BP: (!) 172/80 (!) 164/84  Pulse: 80 84  Resp: 13 15  Temp:    SpO2: 100% 97%    Last Pain:  Vitals:   07/29/21 0830  TempSrc:   PainSc: 0-No pain                 Lidia Collum

## 2021-07-29 NOTE — Anesthesia Procedure Notes (Signed)
Procedure Name: LMA Insertion Date/Time: 07/29/2021 7:35 AM Performed by: Rosaland Lao, CRNA Pre-anesthesia Checklist: Patient identified, Emergency Drugs available, Suction available and Patient being monitored Patient Re-evaluated:Patient Re-evaluated prior to induction Oxygen Delivery Method: Circle system utilized Preoxygenation: Pre-oxygenation with 100% oxygen Induction Type: IV induction LMA: LMA with gastric port inserted LMA Size: 4.0 Number of attempts: 1 Tube secured with: Tape Dental Injury: Teeth and Oropharynx as per pre-operative assessment

## 2021-07-30 ENCOUNTER — Encounter (HOSPITAL_COMMUNITY): Payer: Self-pay | Admitting: Gynecologic Oncology

## 2021-07-30 ENCOUNTER — Telehealth: Payer: Self-pay

## 2021-07-30 NOTE — Telephone Encounter (Signed)
Spoke with Ms.Eulas Post. She states she is eating, drinking, and urinating well. She has had a BM and is passing gas. Encouraged her to drink plenty of water. She denies fever or chills. Her pain is controlled with ibuprofen.   Instructed to call office with any fever, chills, purulent vaginal drainage, uncontrolled pain or any other questions or concerns. Patient verbalizes understanding.  Pt aware of post op appointments as well as the office number [phone] and after hours number [phone] to call if she has any questions or concerns

## 2021-07-30 NOTE — Telephone Encounter (Signed)
LM for Leslie Mullen to call back to the office to discuss how she was doing after her surgery yesterday.

## 2021-07-31 LAB — SURGICAL PATHOLOGY

## 2021-07-31 NOTE — Progress Notes (Signed)
Remote pacemaker transmission.   

## 2021-08-04 ENCOUNTER — Encounter: Payer: 59 | Admitting: Internal Medicine

## 2021-08-13 ENCOUNTER — Telehealth: Payer: Self-pay | Admitting: *Deleted

## 2021-08-13 NOTE — Telephone Encounter (Signed)
I spoke with Ms. Leslie Mullen and we discussed the results of her surgical pathology. No cancer or precancer seen. She has an appt w/ Dr. Denman George next week and then will be scheduled for a repeat biopsy in 3 months.

## 2021-08-19 NOTE — Progress Notes (Signed)
Follow-up Note: Gyn-Onc  Consult was requested by Dr. Charlesetta Garibaldi for the evaluation of Leslie Mullen 60 y.o. female  CC:  Chief Complaint  Patient presents with   Endometrial cancer [facility])     Assessment/Plan:  Leslie Mullen  is a 60 y.o.  year old P3 with a history of grade 1 endometrial cancer diagnosed in 2021, s/p D&C with mirena IUD placement on 07/29/21. No residual cancer found on D&C specimen.   Plan is for follow-up annually with Dr Charlesetta Garibaldi, the patient's gynecologist.   Endometrial sampling would be recommended for new episodes of bleeding. The IUD should be removed and replaced in 5 years.  HPI: Leslie Mullen is a 60 year old P3 who was seen in consultation at the request of Dr Charlesetta Garibaldi for evaluation and treatment of grade 1 endometrial cancer.   Her symptoms began in August 2019 with postmenopausal bleeding.  She underwent an ultrasound at that time did show a uterus measuring 4.8 x 3.7 x 5.2 cm with a 5 mm endometrial stripe.  She subsequently underwent endometrial sampling which showed complex hyperplasia with atypia.  A planned hysterectomy was scheduled at that time however needed to be aborted when hemoglobin A1c testing in February 2020 was elevated at 9.5%.  The patient at that time did not have health insurance coverage for her medications.  She was started on Megace progesterone which she believes she took for approximately 3 months.  This stopped her vaginal bleeding and she discontinued it due to lack of insurance coverage of medications.  She subsequently continued to have poor diabetes control with a hemoglobin A1c in January 2021 measuring 13%.  Following that measures she was seen by an endocrinologist and started on multiple diabetes medications including Tresiba and Ozempic.  She is somewhat better financial coverage of her diabetes medication and is for the most part fairly compliant with them.  She began having postmenopausal bleeding in the summer 2021.  She  returned to see Dr. Charlesetta Garibaldi who performed a transvaginal ultrasound scan on July 23, 2020.  It showed a uterus measuring 4.1 x 4.5 x 3.9 cm with an endometrial thickness of 14 mm.  She was started on progestin therapy.  A Pap test was performed on July 01, 2020 which was normal.  She underwent endometrial sampling with Pipelle in the office on September 11, 2020 which showed a well differentiated endometrioid adenocarcinoma.  Her medical history is most remarkable for morbid obesity with a BMI of 55 kg meters squared.  She has type 2 diabetes mellitus for which she takes insulin and other medications.  She has hypertension.  Interval Hx:  Her original planned surgery was cancelled due to medical comorbidities including fainting on the day of surgery and poor blood glucose optimization.  She was placed on oral progestins in the interim while her medical comorbidities were optimized.  She was taken to the OR on 07/29/21 for a D&C and placement of a Mirena IUD. Intraoperative findings were significant for a uterus sounded to 9cm with an anteverted grossly normal appearing cervix.  Final pathology showed benign endometrium with metaplastic changes.  Since the procedure she has done well with no bleeding.   Current Meds:  Outpatient Encounter Medications as of 08/20/2021  Medication Sig   amLODipine (NORVASC) 10 MG tablet Take 1 tablet (10 mg total) by mouth at bedtime.   gabapentin (NEURONTIN) 300 MG capsule Take 1 capsule (300 mg total) by mouth 2 (two) times daily.   glucose blood  test strip    insulin degludec (TRESIBA FLEXTOUCH) 200 UNIT/ML FlexTouch Pen Inject 80 Units into the skin every evening.   Insulin Pen Needle 31G X 5 MM MISC    lisinopril (ZESTRIL) 20 MG tablet TAKE 1 TABLET(20 MG) BY MOUTH DAILY (Patient taking differently: Take 20 mg by mouth daily.)   omeprazole (PRILOSEC) 20 MG capsule TAKE 1 CAPSULE(20 MG) BY MOUTH AT BEDTIME (Patient taking differently: Take 20 mg by mouth 2  (two) times daily.)   ONETOUCH DELICA LANCETS 99991111 MISC    OZEMPIC, 0.25 OR 0.5 MG/DOSE, 2 MG/1.5ML SOPN INEJCT 0.5 MG INTO THE SKIN ONCE WEEKLY (Patient taking differently: Inject 2 mg into the skin every Saturday.)   polyethylene glycol (MIRALAX / GLYCOLAX) 17 g packet Take 17 g by mouth daily.   pravastatin (PRAVACHOL) 40 MG tablet TAKE 1 TABLET(40 MG) BY MOUTH DAILY (Patient taking differently: Take 40 mg by mouth every evening.)   SYNJARDY XR 12.04-999 MG TB24 Take 1 tablet by mouth 2 (two) times daily.   traMADol (ULTRAM) 50 MG tablet TAKE 1 TABLET(50 MG) BY MOUTH TWICE DAILY AS NEEDED (Patient taking differently: Take 50 mg by mouth 2 (two) times daily.)   trolamine salicylate (ASPERCREME) 10 % cream Apply 1 application topically 2 (two) times daily.   ibuprofen (ADVIL) 200 MG tablet Take 400 mg by mouth every 6 (six) hours as needed for headache. (Patient not taking: Reported on 08/20/2021)   loratadine (CLARITIN) 10 MG tablet Take 10 mg by mouth daily as needed for allergies. (Patient not taking: Reported on 08/20/2021)   tiZANidine (ZANAFLEX) 4 MG tablet Take 4 mg by mouth 3 (three) times daily. (Patient not taking: Reported on 08/20/2021)   No facility-administered encounter medications on file as of 08/20/2021.    Allergy:  Allergies  Allergen Reactions   Penicillins Shortness Of Breath, Itching and Swelling    Has patient had a PCN reaction causing immediate rash, facial/tongue/throat swelling, SOB or lightheadedness with hypotension: Yes Has patient had a PCN reaction causing severe rash involving mucus membranes or skin necrosis:No Has patient had a PCN reaction that required hospitalization: No Has patient had a PCN reaction occurring within the last 10 years: no If all of the above answers are "NO", then may proceed with Cephalosporin use.      Social Hx:   Social History   Socioeconomic History   Marital status: Married    Spouse name: Not on file   Number of children: 2    Years of education: Not on file   Highest education level: Not on file  Occupational History   Occupation: Intake Coordinator  Tobacco Use   Smoking status: Former    Types: Cigarettes   Smokeless tobacco: Never   Tobacco comments:    18 years ago.  Vaping Use   Vaping Use: Never used  Substance and Sexual Activity   Alcohol use: No    Alcohol/week: 0.0 standard drinks   Drug use: No   Sexual activity: Not on file  Other Topics Concern   Not on file  Social History Narrative   Not on file   Social Determinants of Health   Financial Resource Strain: Not on file  Food Insecurity: Not on file  Transportation Needs: Not on file  Physical Activity: Not on file  Stress: Not on file  Social Connections: Not on file  Intimate Partner Violence: Not on file    Past Surgical Hx:  Past Surgical History:  Procedure Laterality Date  CERVICAL BIOPSY     CESAREAN SECTION     x3   COLONOSCOPY WITH PROPOFOL N/A 06/29/2017   Procedure: COLONOSCOPY WITH PROPOFOL;  Surgeon: Irene Shipper, MD;  Location: [facility];  Service: Endoscopy;  Laterality: N/A;   CYSTECTOMY Right 2010   Cyst removed from right groin.    DILATION AND CURETTAGE OF UTERUS N/A 07/29/2021   Procedure: DILATATION AND CURETTAGE OF UTERUS;  Surgeon: Everitt Amber, MD;  Location: [facility];  Service: Gynecology;  Laterality: N/A;   INTRAUTERINE DEVICE (IUD) INSERTION N/A 07/29/2021   Procedure: INTRAUTERINE DEVICE (IUD) INSERTION;  Surgeon: Everitt Amber, MD;  Location: [facility];  Service: Gynecology;  Laterality: N/A;  IUD FROM MAIN PHARMACY   PACEMAKER IMPLANT N/A 10/11/2020   Procedure: PACEMAKER IMPLANT;  Surgeon: Vickie Epley, MD;  Location: [facility];  Service: Cardiovascular;  Laterality: N/A;   TONSILLECTOMY      Past Medical Hx:  Past Medical History:  Diagnosis Date   Abnormal uterine bleeding    Allergy    Anemia    Asthma    Cataract    BOTH EYES   Complete heart block ([place])    a.  Medtronic PPM 10/2020.   Diabetes ([place])    type II    Diabetic neuropathy ([facility])    Endometrial cancer ([place])    GERD (gastroesophageal reflux disease)    Hyperlipidemia    Hypertension    Morbid obesity ([place])    Osteoarthritis    right knee    Pneumonia     Past Gynecological History:  Cesarean section x 3,  No LMP recorded. Patient is postmenopausal.  Family Hx:  Family History  Adopted: Yes  Problem Relation Age of Onset   Diabetes Father    Heart disease Father    Cancer Mother        colon   Colon cancer Neg Hx    Esophageal cancer Neg Hx    Rectal cancer Neg Hx    Stomach cancer Neg Hx     Review of Systems:  Constitutional  Feels well,    ENT Normal appearing ears and nares bilaterally Skin/Breast  No rash, sores, jaundice, itching, dryness Cardiovascular  No chest pain, shortness of breath, or edema  Pulmonary  No cough or wheeze.  Gastro Intestinal  No nausea, vomitting, or diarrhoea. No bright red blood per rectum, no abdominal pain, change in bowel movement, or constipation.  Genito Urinary  No frequency, urgency, dysuria, + postmenopausal bleeding Musculo Skeletal  No myalgia, arthralgia, joint swelling or pain  Neurologic  No weakness, numbness, change in gait,  Psychology  No depression, anxiety, insomnia.   Vitals:  Blood pressure (!) 144/57, pulse 80, temperature 97.9 F (36.6 C), temperature source Tympanic, resp. rate 16, height '5\' 8"'$  (1.727 m), weight (!) 348 lb 12.8 oz (158.2 kg), SpO2 100 %.  Physical Exam: WD in NAD Neck  Supple NROM, without any enlargements.  Lymph Node Survey No cervical supraclavicular or inguinal adenopathy Cardiovascular  Pulse normal rate, regularity and rhythm. S1 and S2 normal.  Lungs  Clear to auscultation bilateraly, without wheezes/crackles/rhonchi. Good air movement.  Skin  No rash/lesions/breakdown  Psychiatry  Alert and oriented to person, place, and time  Abdomen  Normoactive bowel sounds,  abdomen soft, non-tender and obese without evidence of hernia. Large abdominal pannus, relatively smaller profile upper abdomen.  Back No CVA tenderness Genito Urinary  Vulva/vagina: speculum exam confirms IUD strings from os. No bleeding. Otherwise normal  appearing cervix.  Rectal  deferred Extremities  No bilateral cyanosis, clubbing or edema.   Thereasa Solo, MD  08/20/2021, 3:31 PM

## 2021-08-20 ENCOUNTER — Inpatient Hospital Stay: Payer: 59 | Attending: Gynecologic Oncology | Admitting: Gynecologic Oncology

## 2021-08-20 ENCOUNTER — Other Ambulatory Visit: Payer: Self-pay

## 2021-08-20 ENCOUNTER — Encounter: Payer: Self-pay | Admitting: Gynecologic Oncology

## 2021-08-20 VITALS — BP 144/57 | HR 80 | Temp 97.9°F | Resp 16 | Ht 68.0 in | Wt 348.8 lb

## 2021-08-20 DIAGNOSIS — E119 Type 2 diabetes mellitus without complications: Secondary | ICD-10-CM | POA: Insufficient documentation

## 2021-08-20 DIAGNOSIS — Z794 Long term (current) use of insulin: Secondary | ICD-10-CM | POA: Insufficient documentation

## 2021-08-20 DIAGNOSIS — C541 Malignant neoplasm of endometrium: Secondary | ICD-10-CM | POA: Diagnosis not present

## 2021-08-20 DIAGNOSIS — Z6841 Body Mass Index (BMI) 40.0 and over, adult: Secondary | ICD-10-CM | POA: Insufficient documentation

## 2021-08-20 DIAGNOSIS — Z79890 Hormone replacement therapy (postmenopausal): Secondary | ICD-10-CM | POA: Diagnosis not present

## 2021-08-20 NOTE — Patient Instructions (Signed)
The D&C showed no residual cancer in the uterus.  Dr Denman George recommends follow-up once a year with Dr Charlesetta Garibaldi for gynecologic care.  If you develop new bleeding, you should see Dr Charlesetta Garibaldi earlier to be considered for uterine biopsy.  The IUD should be removed and replaced in 5 years.

## 2021-09-01 ENCOUNTER — Telehealth: Payer: Self-pay

## 2021-09-01 NOTE — Telephone Encounter (Signed)
Attempted to contact patient and numbers on DPR to assess and send manual transmission to access presenting AF. No answer, phone went straight to VM. LMTCB  Alert: AT/AF >= 6 hr for 1 days. Avg. Ventricular Rate >= 100 bpm during AT/AF (>= 6 hr) for 1 days.1 NSVT event/ 6 beats. 16 Fast A-V events all appears atrial driven tachycardia. 20-AF events. Burden 0.5%. Current EGM on-going AF episode since 09/24.

## 2021-09-01 NOTE — Telephone Encounter (Signed)
Pt left a voicemail returning nurse call.   I called the patient back and left a voicemail for her to send a transmission with her monitor for the nurse to review.

## 2021-09-02 NOTE — Telephone Encounter (Signed)
LMOVM for pt to send manual transmission for nurse to review.

## 2021-09-02 NOTE — Telephone Encounter (Signed)
Patient called back and needed help sending a transmission, I have told patient how to send it and she states she is going to send it now

## 2021-09-03 ENCOUNTER — Telehealth: Payer: Self-pay

## 2021-09-03 NOTE — Telephone Encounter (Signed)
Returned pt call.  No asnwer.  Call goes straight to VM.  Left VM with device clinic # to return call.

## 2021-09-03 NOTE — Telephone Encounter (Signed)
Pt returned call.  She was not aware of being in AF, she also denies any previous history of AF.    Discussed symptoms with pt.  She reports she has felt SOB with exertion lately.  She denies any other cardiac symptoms.    Advised I would forward to Dr. Quentin Ore for review.

## 2021-09-03 NOTE — Telephone Encounter (Signed)
The patient called wanting to speak with the nurse. I let her know that the nurse is in a meeting and will give her a call back.

## 2021-09-03 NOTE — Telephone Encounter (Signed)
Manual transmission reviewed.  Pt continues in AF.  Need to assess symptoms and med compliance.    Attempted to reach pt, no answer.  LVM with device clinic # and hours to discuss.

## 2021-09-03 NOTE — Telephone Encounter (Signed)
See other phone encounter.  Left message for pt to call back again.

## 2021-09-03 NOTE — Telephone Encounter (Signed)
Patient called back and would like a call back from a nurse  

## 2021-09-08 NOTE — Telephone Encounter (Signed)
Spoke with patient regarding Dr. Claudie Revering recommendations informed her that she had a rhythm called atrial fibrillation, patient agreeable to appointment with AF clinic on 09/18/21 at 2:30pm, gave patient AF clinic number/driving instructions and gated entrance code.

## 2021-09-15 ENCOUNTER — Ambulatory Visit (HOSPITAL_COMMUNITY): Admit: 2021-09-15 | Payer: 59 | Admitting: Internal Medicine

## 2021-09-15 ENCOUNTER — Encounter (HOSPITAL_COMMUNITY): Payer: Self-pay

## 2021-09-15 SURGERY — COLONOSCOPY WITH PROPOFOL
Anesthesia: Monitor Anesthesia Care

## 2021-09-18 ENCOUNTER — Ambulatory Visit (HOSPITAL_COMMUNITY)
Admission: RE | Admit: 2021-09-18 | Discharge: 2021-09-18 | Disposition: A | Discharge status: Home / Self Care | Payer: 59 | Source: Ambulatory Visit | Attending: Physician Assistant | Admitting: Physician Assistant

## 2021-09-18 ENCOUNTER — Encounter (HOSPITAL_COMMUNITY): Payer: Self-pay | Admitting: Physician Assistant

## 2021-09-18 ENCOUNTER — Other Ambulatory Visit: Payer: Self-pay

## 2021-09-18 VITALS — BP 138/80 | HR 88 | Ht 68.0 in | Wt 350.0 lb

## 2021-09-18 DIAGNOSIS — I480 Paroxysmal atrial fibrillation: Secondary | ICD-10-CM | POA: Diagnosis present

## 2021-09-18 DIAGNOSIS — Z794 Long term (current) use of insulin: Secondary | ICD-10-CM | POA: Insufficient documentation

## 2021-09-18 DIAGNOSIS — Z87891 Personal history of nicotine dependence: Secondary | ICD-10-CM | POA: Insufficient documentation

## 2021-09-18 DIAGNOSIS — Z79899 Other long term (current) drug therapy: Secondary | ICD-10-CM | POA: Diagnosis not present

## 2021-09-18 DIAGNOSIS — I10 Essential (primary) hypertension: Secondary | ICD-10-CM | POA: Diagnosis not present

## 2021-09-18 DIAGNOSIS — E785 Hyperlipidemia, unspecified: Secondary | ICD-10-CM | POA: Diagnosis not present

## 2021-09-18 DIAGNOSIS — Z6841 Body Mass Index (BMI) 40.0 and over, adult: Secondary | ICD-10-CM | POA: Insufficient documentation

## 2021-09-18 DIAGNOSIS — D6869 Other thrombophilia: Secondary | ICD-10-CM | POA: Diagnosis not present

## 2021-09-18 DIAGNOSIS — Z880 Allergy status to penicillin: Secondary | ICD-10-CM | POA: Insufficient documentation

## 2021-09-18 DIAGNOSIS — Z950 Presence of cardiac pacemaker: Secondary | ICD-10-CM | POA: Diagnosis not present

## 2021-09-18 DIAGNOSIS — E119 Type 2 diabetes mellitus without complications: Secondary | ICD-10-CM | POA: Insufficient documentation

## 2021-09-18 MED ORDER — APIXABAN 5 MG PO TABS: 5.0000 mg | ORAL_TABLET | Freq: Two times a day (BID) | ORAL | 3 refills | Status: DC

## 2021-09-18 NOTE — Progress Notes (Signed)
Primary Care Physician: Sue Lush, PA-C Primary Cardiologist: Dr Johnsie Cancel Primary Electrophysiologist: Dr Quentin Ore Referring Physician: Dr Quentin Ore   Leslie Mullen is a 60 y.o. female with a history of CHB s/p PPM, DM, HTN, HLD, endometrial cancer, atrial fibrillation who presents for follow up in the [facility].  The patient was initially diagnosed with atrial fibrillation 09/01/21 after the device clinic received an alert for an ongoing afib episode starting 08/30/21. Patient did have palpitations and SOB with exertion at that time. Patient has a CHADS2VASC score of 3. She is back in SR today and reports her palpitations stopped about one week ago. She denies significant snoring or alcohol use. She did have an upper and lower EGD 09/05/21 for worsening reflux which showed edema of duodenal bulb, no bleeding.   Today, she denies symptoms of chest pain, shortness of breath, orthopnea, PND, lower extremity edema, dizziness, presyncope, syncope, snoring, daytime somnolence, bleeding, or neurologic sequela. The patient is tolerating medications without difficulties and is otherwise without complaint today.    Atrial Fibrillation Risk Factors:  she does not have symptoms or diagnosis of sleep apnea. she does not have a history of rheumatic fever. she does not have a history of alcohol use. The patient does have a history of early familial atrial fibrillation or other arrhythmias. Father has afib.  she has a BMI of Body mass index is 53.22 kg/m.Marland Kitchen Filed Weights   09/18/21 1436  Weight: (!) 158.8 kg    Family History  Adopted: Yes  Problem Relation Age of Onset   Diabetes Father    Heart disease Father    Cancer Mother        colon   Colon cancer Neg Hx    Esophageal cancer Neg Hx    Rectal cancer Neg Hx    Stomach cancer Neg Hx      Atrial Fibrillation Management history:  Previous antiarrhythmic drugs: none Previous cardioversions: none Previous  ablations: none CHADS2VASC score: 3 Anticoagulation history: none   Past Medical History:  Diagnosis Date   Abnormal uterine bleeding    Allergy    Anemia    Asthma    Cataract    BOTH EYES   Complete heart block ([place])    a. Medtronic PPM 10/2020.   Diabetes ([place])    type II    Diabetic neuropathy ([place])    Endometrial cancer ([place])    GERD (gastroesophageal reflux disease)    Hyperlipidemia    Hypertension    Morbid obesity ([place])    Osteoarthritis    right knee    Pneumonia    Past Surgical History:  Procedure Laterality Date   CERVICAL BIOPSY     CESAREAN SECTION     x3   COLONOSCOPY WITH PROPOFOL N/A 06/29/2017   Procedure: COLONOSCOPY WITH PROPOFOL;  Surgeon: Irene Shipper, MD;  [facility];  Service: Endoscopy;  Laterality: N/A;   CYSTECTOMY Right 2010   Cyst removed from right groin.    DILATION AND CURETTAGE OF UTERUS N/A 07/29/2021   Procedure: DILATATION AND CURETTAGE OF UTERUS;  Surgeon: Everitt Amber, MD;  [facility];  Service: Gynecology;  Laterality: N/A;   INTRAUTERINE DEVICE (IUD) INSERTION N/A 07/29/2021   Procedure: INTRAUTERINE DEVICE (IUD) INSERTION;  Surgeon: Everitt Amber, MD;  [facility];  Service: Gynecology;  Laterality: N/A;  IUD FROM MAIN PHARMACY   PACEMAKER IMPLANT N/A 10/11/2020   Procedure: PACEMAKER IMPLANT;  Surgeon: Vickie Epley, MD;  Location: [facility];  Service: Cardiovascular;  Laterality: N/A;   TONSILLECTOMY      Current Outpatient Medications  Medication Sig Dispense Refill   amLODipine (NORVASC) 10 MG tablet Take 1 tablet (10 mg total) by mouth at bedtime. 30 tablet 0   gabapentin (NEURONTIN) 300 MG capsule Take 1 capsule (300 mg total) by mouth 2 (two) times daily.     glucose blood test strip      insulin degludec (TRESIBA FLEXTOUCH) 200 UNIT/ML FlexTouch Pen Inject 80 Units into the skin every evening.     Insulin Pen Needle 31G X 5 MM MISC      lisinopril (ZESTRIL) 20 MG tablet TAKE 1  TABLET(20 MG) BY MOUTH DAILY 90 tablet 1   loratadine (CLARITIN) 10 MG tablet Take 10 mg by mouth daily as needed for allergies.     omeprazole (PRILOSEC) 40 MG capsule Take 40 mg by mouth 2 (two) times daily.     ONETOUCH DELICA LANCETS 37T MISC      OZEMPIC, 2 MG/DOSE, 8 MG/3ML SOPN Inject into the skin.     polyethylene glycol (MIRALAX / GLYCOLAX) 17 g packet Take 17 g by mouth daily.     pravastatin (PRAVACHOL) 40 MG tablet TAKE 1 TABLET(40 MG) BY MOUTH DAILY 90 tablet 1   SYNJARDY XR 12.04-999 MG TB24 Take 1 tablet by mouth 2 (two) times daily.     traMADol (ULTRAM) 50 MG tablet TAKE 1 TABLET(50 MG) BY MOUTH TWICE DAILY AS NEEDED 60 tablet 0   trolamine salicylate (ASPERCREME) 10 % cream Apply 1 application topically 2 (two) times daily.     No current facility-administered medications for this encounter.    Allergies  Allergen Reactions   Penicillins Shortness Of Breath, Itching and Swelling    Has patient had a PCN reaction causing immediate rash, facial/tongue/throat swelling, SOB or lightheadedness with hypotension: Yes Has patient had a PCN reaction causing severe rash involving mucus membranes or skin necrosis:No Has patient had a PCN reaction that required hospitalization: No Has patient had a PCN reaction occurring within the last 10 years: no If all of the above answers are "NO", then may proceed with Cephalosporin use.      Social History   Socioeconomic History   Marital status: Married    Spouse name: Not on file   Number of children: 2   Years of education: Not on file   Highest education level: Not on file  Occupational History   Occupation: Intake Coordinator  Tobacco Use   Smoking status: Former    Types: Cigarettes   Smokeless tobacco: Never   Tobacco comments:    Former smoker (09/18/2021)  Vaping Use   Vaping Use: Never used  Substance and Sexual Activity   Alcohol use: No    Alcohol/week: 0.0 standard drinks   Drug use: No   Sexual activity: Not  on file  Other Topics Concern   Not on file  Social History Narrative   Not on file   Social Determinants of Health   Financial Resource Strain: Not on file  Food Insecurity: Not on file  Transportation Needs: Not on file  Physical Activity: Not on file  Stress: Not on file  Social Connections: Not on file  Intimate Partner Violence: Not on file     ROS- All systems are reviewed and negative except as per the HPI above.  Physical Exam: Vitals:   09/18/21 1436  BP: 138/80  Pulse: 88  Weight: Marland Kitchen)  158.8 kg  Height: 5\' 8"  (1.727 m)    GEN- The patient is a well appearing obese female, alert and oriented x 3 today.   Head- normocephalic, atraumatic Eyes-  Sclera clear, conjunctiva pink Ears- hearing intact Oropharynx- clear Neck- supple  Lungs- Clear to ausculation bilaterally, normal work of breathing Heart- Regular rate and rhythm, no murmurs, rubs or gallops  GI- soft, NT, ND, + BS Extremities- no clubbing, cyanosis, or edema MS- no significant deformity or atrophy Skin- no rash or lesion Psych- euthymic mood, full affect Neuro- strength and sensation are intact  Wt Readings from Last 3 Encounters:  09/18/21 (!) 158.8 kg  08/20/21 (!) 158.2 kg  07/29/21 (!) 157.1 kg    EKG today demonstrates  SR Vent. rate 88 BPM PR interval 168 ms QRS duration 76 ms QT/QTcB 366/442 ms  Echo 10/10/20 demonstrated  1. Left ventricular ejection fraction, by estimation, is 65 to 70%. The  left ventricle has normal function. The left ventricle has no regional  wall motion abnormalities. There is moderate concentric left ventricular hypertrophy. Left ventricular diastolic parameters are consistent with Grade I diastolic dysfunction (impaired relaxation). Elevated left atrial pressure.   2. Right ventricular systolic function is normal. The right ventricular  size is normal. There is normal pulmonary artery systolic pressure.   3. The mitral valve is normal in structure. No  evidence of mitral valve regurgitation. No evidence of mitral stenosis.   4. The aortic valve is normal in structure. Aortic valve regurgitation is not visualized. No aortic stenosis is present.   5. The inferior vena cava is normal in size with greater than 50%  respiratory variability, suggesting right atrial pressure of 3 mmHg.   Epic records are reviewed at length today  CHA2DS2-VASc Score = 3  The patient's score is based upon: CHF History: 0 HTN History: 1 Diabetes History: 1 Stroke History: 0 Vascular Disease History: 0 Age Score: 0 Gender Score: 1      ASSESSMENT AND PLAN: 1. Paroxysmal Atrial Fibrillation (ICD10:  I48.0) The patient's CHA2DS2-VASc score is 3, indicating a 3.2% annual risk of stroke.   General education about afib provided and questions answered. We also discussed her stroke risk and the risks and benefits of anticoagulation. Start Eliquis 5 mg BID, recent bmet/CBC reviewed.   2. Secondary Hypercoagulable State (ICD10:  D68.69) The patient is at significant risk for stroke/thromboembolism based upon her CHA2DS2-VASc Score of 3.  Start Apixaban (Eliquis).   3. Obesity Body mass index is 53.22 kg/m. Lifestyle modification was discussed at length including regular exercise and weight reduction. Patient doing intermittent fasting.   4. CHB S/p PPM, followed by Dr Quentin Ore and the device clinic.  5. HTN Stable, no changes today.   Follow up in the AF clinic in one month.    [facility] [address] [place], [place] [address] [phone] 09/18/2021 2:44 PM

## 2021-09-18 NOTE — Patient Instructions (Signed)
Start Eliquis 5mg twice a day 

## 2021-09-24 ENCOUNTER — Emergency Department (HOSPITAL_COMMUNITY): Payer: 59

## 2021-09-24 ENCOUNTER — Other Ambulatory Visit: Payer: Self-pay

## 2021-09-24 ENCOUNTER — Encounter (HOSPITAL_COMMUNITY): Payer: Self-pay | Admitting: Emergency Medicine

## 2021-09-24 ENCOUNTER — Emergency Department (HOSPITAL_COMMUNITY)
Admission: EM | Admit: 2021-09-24 | Discharge: 2021-09-25 | Disposition: A | Discharge status: Home / Self Care | Payer: 59 | Attending: Emergency Medicine | Admitting: Emergency Medicine

## 2021-09-24 DIAGNOSIS — J45909 Unspecified asthma, uncomplicated: Secondary | ICD-10-CM | POA: Diagnosis not present

## 2021-09-24 DIAGNOSIS — E1122 Type 2 diabetes mellitus with diabetic chronic kidney disease: Secondary | ICD-10-CM | POA: Diagnosis not present

## 2021-09-24 DIAGNOSIS — Z79899 Other long term (current) drug therapy: Secondary | ICD-10-CM | POA: Insufficient documentation

## 2021-09-24 DIAGNOSIS — R110 Nausea: Secondary | ICD-10-CM | POA: Diagnosis not present

## 2021-09-24 DIAGNOSIS — Z794 Long term (current) use of insulin: Secondary | ICD-10-CM | POA: Diagnosis not present

## 2021-09-24 DIAGNOSIS — Z7901 Long term (current) use of anticoagulants: Secondary | ICD-10-CM | POA: Diagnosis not present

## 2021-09-24 DIAGNOSIS — R1010 Upper abdominal pain, unspecified: Secondary | ICD-10-CM

## 2021-09-24 DIAGNOSIS — I129 Hypertensive chronic kidney disease with stage 1 through stage 4 chronic kidney disease, or unspecified chronic kidney disease: Secondary | ICD-10-CM | POA: Diagnosis not present

## 2021-09-24 DIAGNOSIS — R748 Abnormal levels of other serum enzymes: Secondary | ICD-10-CM

## 2021-09-24 DIAGNOSIS — R1011 Right upper quadrant pain: Secondary | ICD-10-CM | POA: Diagnosis present

## 2021-09-24 DIAGNOSIS — N1831 Chronic kidney disease, stage 3a: Secondary | ICD-10-CM | POA: Diagnosis not present

## 2021-09-24 DIAGNOSIS — Z950 Presence of cardiac pacemaker: Secondary | ICD-10-CM | POA: Diagnosis not present

## 2021-09-24 DIAGNOSIS — E1140 Type 2 diabetes mellitus with diabetic neuropathy, unspecified: Secondary | ICD-10-CM | POA: Insufficient documentation

## 2021-09-24 DIAGNOSIS — Z87891 Personal history of nicotine dependence: Secondary | ICD-10-CM | POA: Diagnosis not present

## 2021-09-24 LAB — CBC
HCT: 34.3 % — ABNORMAL LOW (ref 36.0–46.0)
Hemoglobin: 10.4 g/dL — ABNORMAL LOW (ref 12.0–15.0)
MCH: 26.7 pg (ref 26.0–34.0)
MCHC: 30.3 g/dL (ref 30.0–36.0)
MCV: 88.2 fL (ref 80.0–100.0)
Platelets: 399 10*3/uL (ref 150–400)
RBC: 3.89 MIL/uL (ref 3.87–5.11)
RDW: 12.3 % (ref 11.5–15.5)
WBC: 15.1 10*3/uL — ABNORMAL HIGH (ref 4.0–10.5)
nRBC: 0 % (ref 0.0–0.2)

## 2021-09-24 LAB — COMPREHENSIVE METABOLIC PANEL
ALT: 8 U/L (ref 0–44)
AST: 14 U/L — ABNORMAL LOW (ref 15–41)
Albumin: 2.4 g/dL — ABNORMAL LOW (ref 3.5–5.0)
Alkaline Phosphatase: 74 U/L (ref 38–126)
Anion gap: 10 (ref 5–15)
BUN: 12 mg/dL (ref 6–20)
CO2: 27 mmol/L (ref 22–32)
Calcium: 8.8 mg/dL — ABNORMAL LOW (ref 8.9–10.3)
Chloride: 103 mmol/L (ref 98–111)
Creatinine, Ser: 1.39 mg/dL — ABNORMAL HIGH (ref 0.44–1.00)
GFR, Estimated: 44 mL/min — ABNORMAL LOW (ref >60)
Glucose, Bld: 145 mg/dL — ABNORMAL HIGH (ref 70–99)
Potassium: 4.3 mmol/L (ref 3.5–5.1)
Sodium: 140 mmol/L (ref 135–145)
Total Bilirubin: 0.5 mg/dL (ref 0.3–1.2)
Total Protein: 7.6 g/dL (ref 6.5–8.1)

## 2021-09-24 LAB — URINALYSIS, ROUTINE W REFLEX MICROSCOPIC
Bacteria, UA: NONE SEEN
Bilirubin Urine: NEGATIVE
Glucose, UA: >=500 mg/dL — AB
Ketones, ur: NEGATIVE mg/dL
Leukocytes,Ua: NEGATIVE
Nitrite: NEGATIVE
Protein, ur: >=300 mg/dL — AB
Specific Gravity, Urine: 1.024 (ref 1.005–1.030)
pH: 6 (ref 5.0–8.0)

## 2021-09-24 LAB — LIPASE, BLOOD: Lipase: 452 U/L — ABNORMAL HIGH (ref 11–51)

## 2021-09-24 MED ORDER — MORPHINE SULFATE (PF) 4 MG/ML IV SOLN
4.0000 mg | Freq: Once | INTRAVENOUS | Status: AC
  Administered 2021-09-24: 4 mg via INTRAVENOUS
  Filled 2021-09-24: qty 1

## 2021-09-24 MED ORDER — SODIUM CHLORIDE 0.9 % IV BOLUS
1000.0000 mL | Freq: Once | INTRAVENOUS | Status: AC
  Administered 2021-09-24: 1000 mL via INTRAVENOUS

## 2021-09-24 MED ORDER — ONDANSETRON HCL 4 MG/2ML IJ SOLN
4.0000 mg | Freq: Once | INTRAMUSCULAR | Status: AC
  Administered 2021-09-24: 4 mg via INTRAVENOUS
  Filled 2021-09-24: qty 2

## 2021-09-24 NOTE — ED Provider Notes (Signed)
[facility] EMERGENCY DEPARTMENT Provider Note   CSN: [id number] Arrival date & time: 09/24/21  2046     History Chief Complaint  Patient presents with   Abdominal Pain    Genevive Printup is a 60 y.o. female.  HPI Patient is a 60 year old female with a past medical history significant for anemia, asthma, DM2, endometrial cancer, HLD, HTN, obesity  Patient is presented to the emergency room today with complaints of right upper quadrant abdominal pain and nausea without vomiting she states is been ongoing for the past 2 weeks but seems to have been much worse over the weekend in the last couple days.  She states today the pain was severe.  She states that she does have a history of a stomach ulcer that she was told that she had after endoscopy within the past few weeks.  She describes the pain is achy constant worse with touch and severely painful.  She denies any radiation of pain to her back.  She denies any chest pain or shortness of breath states that she feels nauseous at this moment but has not had any vomiting.  Patient reports that she is experiencing for about right upper GI pain. Patient reports she has been seen by GI on 09/06/2019 had an endoscopy done and they found an ulcer but the area was inflamed and the gastroenterologist was unable to assess the extent of the ulcer. Patient reports she is taking her medication Pepcid and Protonix as prescribed but is still experiencing a great deal of pain. Patient reports she has a follow-up appointment on 09/25/2021 to see GI.      Past Medical History:  Diagnosis Date   Abnormal uterine bleeding    Allergy    Anemia    Asthma    Cataract    BOTH EYES   Complete heart block ([place])    a. [employer] PPM 10/2020.   Diabetes ([place])    type II    Diabetic neuropathy ([place])    Endometrial cancer ([place])    GERD (gastroesophageal reflux disease)    Hyperlipidemia    Hypertension    Morbid obesity ([place])     Osteoarthritis    right knee    Pneumonia     Patient Active Problem List   Diagnosis Date Noted   Paroxysmal atrial fibrillation ([place]) 09/18/2021   Secondary hypercoagulable state ([place]) 09/18/2021   Endometrial cancer ([place]) 07/29/2021   Heart block 10/10/2020   Syncope 10/10/2020   Type 2 diabetes mellitus with diabetic polyneuropathy, with long-term current use of insulin ([place]) 01/31/2020   Type 2 diabetes mellitus with hyperglycemia, with long-term current use of insulin ([place]) 01/31/2020   Type 2 diabetes mellitus with stage 3a chronic kidney disease, with long-term current use of insulin ([place]) 01/31/2020   Abnormal feces    Benign neoplasm of ascending colon    Benign neoplasm of cecum    Benign neoplasm of transverse colon     Past Surgical History:  Procedure Laterality Date   CERVICAL BIOPSY     CESAREAN SECTION     x3   COLONOSCOPY WITH PROPOFOL N/A 06/29/2017   Procedure: COLONOSCOPY WITH PROPOFOL;  Surgeon: Irene Shipper, MD;  Location: [facility] ENDOSCOPY;  Service: Endoscopy;  Laterality: N/A;   CYSTECTOMY Right 2010   Cyst removed from right groin.    DILATION AND CURETTAGE OF UTERUS N/A 07/29/2021   Procedure: DILATATION AND CURETTAGE OF UTERUS;  Surgeon: Everitt Amber, MD;  Location: [facility] ORS;  Service: Gynecology;  Laterality: N/A;   INTRAUTERINE DEVICE (IUD) INSERTION N/A 07/29/2021   Procedure: INTRAUTERINE DEVICE (IUD) INSERTION;  Surgeon: Everitt Amber, MD;  Location: [facility] OR[facility];  Service: Gynecology;  Laterality: N/A;  IUD FROM MAIN PHARMACY   PACEMAKER IMPLANT N/A 10/11/2020   Procedure: PACEMAKER IMPLANT;  Surgeon: Vickie Epley, MD;  Location: [place] [facility];  Service: Cardiovascular;  Laterality: N/A;   TONSILLECTOMY       OB History   No obstetric history on file.     Family History  Adopted: Yes  Problem Relation Age of Onset   Diabetes Father    Heart disease Father    Cancer Mother        colon   Colon cancer Neg Hx    Esophageal cancer Neg Hx     Rectal cancer Neg Hx    Stomach cancer Neg Hx     Social History   Tobacco Use   Smoking status: Former    Types: Cigarettes   Smokeless tobacco: Never   Tobacco comments:    Former smoker (09/18/2021)  Vaping Use   Vaping Use: Never used  Substance Use Topics   Alcohol use: No    Alcohol/week: 0.0 standard drinks   Drug use: No    Home Medications Prior to Admission medications   Medication Sig Start Date End Date Taking? Authorizing Provider  amLODipine (NORVASC) 10 MG tablet Take 1 tablet (10 mg total) by mouth at bedtime. 01/03/21   [name], MD  apixaban (ELIQUIS) 5 MG TABS tablet Take 1 tablet (5 mg total) by mouth 2 (two) times daily. 09/18/21   Fenton, Clint R, PA  gabapentin (NEURONTIN) 300 MG capsule Take 1 capsule (300 mg total) by mouth 2 (two) times daily. 10/12/20   Evans Lance, MD  glucose blood test strip  07/24/15   [provider]  insulin degludec (TRESIBA FLEXTOUCH) 200 UNIT/ML FlexTouch Pen Inject 80 Units into the skin every evening. 10/12/20   Evans Lance, MD  Insulin Pen Needle 31G X 5 MM MISC  08/01/15   [provider]  lisinopril (ZESTRIL) 20 MG tablet TAKE 1 TABLET(20 MG) BY MOUTH DAILY 06/13/21   Vickie Epley, MD  loratadine (CLARITIN) 10 MG tablet Take 10 mg by mouth daily as needed for allergies.    [provider]  omeprazole (PRILOSEC) 40 MG capsule Take 40 mg by mouth 2 (two) times daily. 09/05/21   [provider]  ONETOUCH DELICA LANCETS 70W MISC  08/01/15   [provider]  OZEMPIC, 2 MG/DOSE, 8 MG/3ML SOPN Inject into the skin. 07/21/21   [provider]  polyethylene glycol (MIRALAX / GLYCOLAX) 17 g packet Take 17 g by mouth daily.    [provider]  pravastatin (PRAVACHOL) 40 MG tablet TAKE 1 TABLET(40 MG) BY MOUTH DAILY 07/23/20   [name], MD  SYNJARDY XR 12.04-999 MG TB24 Take 1 tablet by mouth 2 (two) times daily. 05/30/21   [provider]   traMADol (ULTRAM) 50 MG tablet TAKE 1 TABLET(50 MG) BY MOUTH TWICE DAILY AS NEEDED 09/11/20   [name], MD  trolamine salicylate (ASPERCREME) 10 % cream Apply 1 application topically 2 (two) times daily.    [provider]    Allergies    Penicillins  Review of Systems   Review of Systems  Constitutional:  Negative for chills and fever.  HENT:  Negative for congestion.   Eyes:  Negative  for pain.  Respiratory:  Negative for cough and shortness of breath.   Cardiovascular:  Negative for chest pain and leg swelling.  Gastrointestinal:  Positive for abdominal pain and nausea. Negative for diarrhea and vomiting.  Genitourinary:  Negative for dysuria.  Musculoskeletal:  Negative for myalgias.  Skin:  Negative for rash.  Neurological:  Negative for dizziness and headaches.   Physical Exam Updated Vital Signs BP (!) 168/79 (BP Location: Right Arm)   Pulse 80   Temp 97.9 F (36.6 C) (Oral)   Resp 16   SpO2 100%   Physical Exam Vitals and nursing note reviewed.  Constitutional:      General: She is in acute distress.     Appearance: She is obese. She is not ill-appearing.  HENT:     Head: Normocephalic and atraumatic.     Nose: Nose normal.  Eyes:     General: No scleral icterus. Cardiovascular:     Rate and Rhythm: Normal rate and regular rhythm.     Pulses: Normal pulses.     Heart sounds: Normal heart sounds.  Pulmonary:     Effort: Pulmonary effort is normal. No respiratory distress.     Breath sounds: No wheezing.  Abdominal:     Palpations: Abdomen is soft.     Tenderness: There is abdominal tenderness.     Comments: Morbidly obese abdomen with tenderness to palpation of the right upper quadrant no guarding or rebound.  No other focal abdominal tenderness.  Musculoskeletal:     Cervical back: Normal range of motion.     Right lower leg: No edema.     Left lower leg: No edema.  Skin:    General: Skin is warm and dry.     Capillary Refill:  Capillary refill takes less than 2 seconds.  Neurological:     Mental Status: She is alert. Mental status is at baseline.  Psychiatric:        Mood and Affect: Mood normal.        Behavior: Behavior normal.    ED Results / Procedures / Treatments   Labs (all labs ordered are listed, but only abnormal results are displayed) Labs Reviewed  LIPASE, BLOOD - Abnormal; Notable for the following components:      Result Value   Lipase 452 (*)    All other components within normal limits  COMPREHENSIVE METABOLIC PANEL - Abnormal; Notable for the following components:   Glucose, Bld 145 (*)    Creatinine, Ser 1.39 (*)    Calcium 8.8 (*)    Albumin 2.4 (*)    AST 14 (*)    GFR, Estimated 44 (*)    All other components within normal limits  CBC - Abnormal; Notable for the following components:   WBC 15.1 (*)    Hemoglobin 10.4 (*)    HCT 34.3 (*)    All other components within normal limits  URINALYSIS, ROUTINE W REFLEX MICROSCOPIC - Abnormal; Notable for the following components:   APPearance HAZY (*)    Glucose, UA >=500 (*)    Hgb urine dipstick SMALL (*)    Protein, ur >=300 (*)    All other components within normal limits  URINE CULTURE  CBG MONITORING, ED    EKG None  Radiology US Abdomen Limited RUQ (LIVER/GB)  Result Date: 09/24/2021 CLINICAL DATA:  Right upper quadrant abdominal pain EXAM: ULTRASOUND ABDOMEN LIMITED RIGHT UPPER QUADRANT COMPARISON:  None. FINDINGS: Gallbladder: No gallstones or wall thickening visualized. No sonographic Percell Miller  sign noted by sonographer. Common bile duct: Diameter: 4 mm Liver: No focal lesion identified. Within normal limits in parenchymal echogenicity. Portal vein is patent on color Doppler imaging with normal direction of blood flow towards the liver. Other: 4.9 x 4.9 x 5.3 cm lesion adjacent to the pancreatic head. While this may reflect a loop of bowel, a mass is not excluded. IMPRESSION: 5.3 cm lesion adjacent to the pancreatic head.  While this may reflect a loop of bowel, a mass is not excluded. Consider CT abdomen with contrast for further evaluation. Electronically Signed   By: Julian Hy M.D.   On: 09/24/2021 23:27    Procedures Procedures   Medications Ordered in ED Medications  morphine 4 MG/ML injection 4 mg (4 mg Intravenous Given 09/24/21 2205)  ondansetron (ZOFRAN) injection 4 mg (4 mg Intravenous Given 09/24/21 2205)  sodium chloride 0.9 % bolus 1,000 mL (1,000 mLs Intravenous New Bag/Given 09/24/21 2204)    ED Course  I have reviewed the triage vital signs and the nursing notes.  Pertinent labs & imaging results that were available during my care of the patient were reviewed by me and considered in my medical decision making (see chart for details).  Clinical Course as of 09/24/21 2339  Wed Sep 24, 2021  2247 CMP with stable kidney function from 1 month ago.  No electrolyte abnormalities.  CBC w/ leukocytosis and chronic ongoing anemia seems to be fluctuating in nature. [WF]    Clinical Course User Index [WF] Tedd Sias, PA   MDM Rules/Calculators/A&P                          Patient is a 60 year old female with past medical history detailed in HPI notably does have history of diabetes  She is presented today with 2 weeks of ongoing intermittent epigastric/right upper quadrant [place] pain does not seem to be aggravated mitigated by any particular factors specifically does not seem to be better or worse with eating has a known ulcer in her stomach found approximately 20 days ago on EGD  She has not had any dark or tarry stools.  She was not told that it was bleeding when she had the EGD done.  CBC with leukocytosis CMP with mild elevation creatinine but does seem to be at patient's baseline.  Stable anemia.  Lipase elevated at 450.  Consistent with pancreatitis.  This is consistent with patient's symptoms today.  CMP without hyperbilirubinemia right upper quadrant ultrasound shows 5 x 3  cm lesion adjacent to the pancreatic head thought to be either bowel loop or perhaps lesion.  At this point CT abdomen pelvis with contrast would be helpful in evaluating patient's pain as well as and evaluating the possible lesion on the pancreas  CT abdomen pelvis with contrast ordered.  Urinalysis with glucosuria and protein consistent with prior  She has no dysuria or urgency but does describe frequency likely from patient's intermittent hyperglycemia however blood sugars only 145 here.  Will obtain urine culture  Patient care transferred to oncoming team Gypsy Decant PA-C  For follow-up on CT imaging.  Final Clinical Impression(s) / ED Diagnoses Final diagnoses:  RUQ abdominal pain  Pain of upper abdomen  Nausea  Elevated lipase    Rx / DC Orders ED Discharge Orders     None        Tedd Sias, [place] 09/24/21 2339    Lorelle Gibbs, DO 09/26/21 1536

## 2021-09-24 NOTE — ED Triage Notes (Signed)
Pt c/o RUQ pain and nausea, intermittent x 2 weeks, worsening today. Also c/o increased urination.

## 2021-09-25 ENCOUNTER — Emergency Department (HOSPITAL_COMMUNITY): Payer: 59

## 2021-09-25 LAB — CBG MONITORING, ED: Glucose-Capillary: 155 mg/dL — ABNORMAL HIGH (ref 70–99)

## 2021-09-25 MED ORDER — ONDANSETRON 4 MG PO TBDP: 4.0000 mg | ORAL_TABLET | Freq: Three times a day (TID) | ORAL | 0 refills | Status: DC | PRN

## 2021-09-25 MED ORDER — MORPHINE SULFATE (PF) 4 MG/ML IV SOLN
4.0000 mg | Freq: Once | INTRAVENOUS | Status: AC
  Administered 2021-09-25: 4 mg via INTRAVENOUS
  Filled 2021-09-25: qty 1

## 2021-09-25 MED ORDER — IOHEXOL 350 MG/ML SOLN
85.0000 mL | Freq: Once | INTRAVENOUS | Status: AC | PRN
  Administered 2021-09-25: 85 mL via INTRAVENOUS

## 2021-09-25 MED ORDER — ONDANSETRON HCL 4 MG/2ML IJ SOLN
4.0000 mg | Freq: Once | INTRAMUSCULAR | Status: AC
  Administered 2021-09-25: 4 mg via INTRAVENOUS
  Filled 2021-09-25: qty 2

## 2021-09-25 MED ORDER — OXYCODONE-ACETAMINOPHEN 5-325 MG PO TABS: 1.0000 | ORAL_TABLET | Freq: Four times a day (QID) | ORAL | 0 refills | Status: DC | PRN

## 2021-09-25 NOTE — Discharge Instructions (Addendum)
As we discussed, your CT scan shows an area of inflammation that is consistent with pancreatitis. It is important to follow up with your GI doctor in 4-6 weeks for repeat imaging to insure that this is resolved.   Take the medications as prescribed for pain and nausea. Clear liquid diet for the next 24-48 hours, until symptoms improve, then advance your diet as tolerated.   If you develop a fever, have severe/uncontrolled pain or vomiting, or have new symptom of concern, return to the ED for further evaluation.

## 2021-09-25 NOTE — ED Provider Notes (Signed)
2 weeks of epigastric and RUQ pain [place] - abnormal pancreas CT pending  Anticipate D/ch Pain controlled  H/O GI ulcer - EGD 20 days ago  1:45 - CT results show:   IMPRESSION:  4.1 cm low-density lesion in the pancreatic head/body, possibly  reflecting focal pancreatitis given the clinical history. However,  this warrants short-term follow-up in 4-6 weeks to document  improvement/resolution. Alternatively, EUS could be considered for  tissue sampling as clinically warranted.   On re-evaluation, the patient is up to the bathroom and reports no pain. I met her in the room and reviewed symptoms, presentation, results. During this time, she reports her pain is returning. Will re-dose pain/nausea medication prior to discharge.   Discussed that her pain could be ulcer vs pancreatitis. Discussed the recommended short term follow up in 4-6 weeks to insure no pancreatitic abnormalities. She is well established with GI in [place] and understands importance of follow up.   Will provide symptom control, discussed gut rest, advancing diet as tolerated.    Charlann Lange, PA-C 09/25/21 0158    Lorelle Gibbs, DO 09/26/21 1536

## 2021-09-26 LAB — URINE CULTURE

## 2021-10-05 ENCOUNTER — Inpatient Hospital Stay (HOSPITAL_COMMUNITY)
Admission: EM | Admit: 2021-10-05 | Discharge: 2021-10-09 | DRG: 435 | Disposition: A | Discharge status: Home / Self Care | Payer: 59 | Attending: Internal Medicine | Admitting: Internal Medicine

## 2021-10-05 ENCOUNTER — Inpatient Hospital Stay (HOSPITAL_COMMUNITY): Payer: 59

## 2021-10-05 ENCOUNTER — Other Ambulatory Visit: Payer: Self-pay

## 2021-10-05 ENCOUNTER — Encounter (HOSPITAL_COMMUNITY): Payer: Self-pay | Admitting: Emergency Medicine

## 2021-10-05 DIAGNOSIS — I459 Conduction disorder, unspecified: Secondary | ICD-10-CM

## 2021-10-05 DIAGNOSIS — E1142 Type 2 diabetes mellitus with diabetic polyneuropathy: Secondary | ICD-10-CM | POA: Diagnosis present

## 2021-10-05 DIAGNOSIS — Z79899 Other long term (current) drug therapy: Secondary | ICD-10-CM | POA: Diagnosis not present

## 2021-10-05 DIAGNOSIS — Z87891 Personal history of nicotine dependence: Secondary | ICD-10-CM

## 2021-10-05 DIAGNOSIS — K7689 Other specified diseases of liver: Secondary | ICD-10-CM | POA: Diagnosis not present

## 2021-10-05 DIAGNOSIS — I10 Essential (primary) hypertension: Secondary | ICD-10-CM | POA: Diagnosis not present

## 2021-10-05 DIAGNOSIS — I442 Atrioventricular block, complete: Secondary | ICD-10-CM | POA: Diagnosis present

## 2021-10-05 DIAGNOSIS — C541 Malignant neoplasm of endometrium: Secondary | ICD-10-CM | POA: Diagnosis not present

## 2021-10-05 DIAGNOSIS — D649 Anemia, unspecified: Secondary | ICD-10-CM | POA: Diagnosis not present

## 2021-10-05 DIAGNOSIS — D75839 Thrombocytosis, unspecified: Secondary | ICD-10-CM | POA: Diagnosis present

## 2021-10-05 DIAGNOSIS — E1122 Type 2 diabetes mellitus with diabetic chronic kidney disease: Secondary | ICD-10-CM | POA: Diagnosis present

## 2021-10-05 DIAGNOSIS — K8689 Other specified diseases of pancreas: Secondary | ICD-10-CM

## 2021-10-05 DIAGNOSIS — D6489 Other specified anemias: Secondary | ICD-10-CM | POA: Diagnosis present

## 2021-10-05 DIAGNOSIS — E1165 Type 2 diabetes mellitus with hyperglycemia: Secondary | ICD-10-CM

## 2021-10-05 DIAGNOSIS — C799 Secondary malignant neoplasm of unspecified site: Secondary | ICD-10-CM

## 2021-10-05 DIAGNOSIS — K5900 Constipation, unspecified: Secondary | ICD-10-CM | POA: Diagnosis present

## 2021-10-05 DIAGNOSIS — D630 Anemia in neoplastic disease: Secondary | ICD-10-CM | POA: Diagnosis present

## 2021-10-05 DIAGNOSIS — C258 Malignant neoplasm of overlapping sites of pancreas: Principal | ICD-10-CM | POA: Diagnosis present

## 2021-10-05 DIAGNOSIS — I129 Hypertensive chronic kidney disease with stage 1 through stage 4 chronic kidney disease, or unspecified chronic kidney disease: Secondary | ICD-10-CM | POA: Diagnosis present

## 2021-10-05 DIAGNOSIS — K219 Gastro-esophageal reflux disease without esophagitis: Secondary | ICD-10-CM | POA: Diagnosis present

## 2021-10-05 DIAGNOSIS — Z8249 Family history of ischemic heart disease and other diseases of the circulatory system: Secondary | ICD-10-CM | POA: Diagnosis not present

## 2021-10-05 DIAGNOSIS — E119 Type 2 diabetes mellitus without complications: Secondary | ICD-10-CM | POA: Diagnosis not present

## 2021-10-05 DIAGNOSIS — K858 Other acute pancreatitis without necrosis or infection: Secondary | ICD-10-CM | POA: Diagnosis present

## 2021-10-05 DIAGNOSIS — Z7901 Long term (current) use of anticoagulants: Secondary | ICD-10-CM

## 2021-10-05 DIAGNOSIS — N179 Acute kidney failure, unspecified: Secondary | ICD-10-CM | POA: Diagnosis present

## 2021-10-05 DIAGNOSIS — Z8719 Personal history of other diseases of the digestive system: Secondary | ICD-10-CM

## 2021-10-05 DIAGNOSIS — Z8542 Personal history of malignant neoplasm of other parts of uterus: Secondary | ICD-10-CM

## 2021-10-05 DIAGNOSIS — Z20822 Contact with and (suspected) exposure to covid-19: Secondary | ICD-10-CM | POA: Diagnosis present

## 2021-10-05 DIAGNOSIS — K859 Acute pancreatitis without necrosis or infection, unspecified: Secondary | ICD-10-CM | POA: Diagnosis present

## 2021-10-05 DIAGNOSIS — Z833 Family history of diabetes mellitus: Secondary | ICD-10-CM

## 2021-10-05 DIAGNOSIS — C787 Secondary malignant neoplasm of liver and intrahepatic bile duct: Secondary | ICD-10-CM | POA: Diagnosis present

## 2021-10-05 DIAGNOSIS — E785 Hyperlipidemia, unspecified: Secondary | ICD-10-CM | POA: Diagnosis present

## 2021-10-05 DIAGNOSIS — Z800 Family history of malignant neoplasm of digestive organs: Secondary | ICD-10-CM

## 2021-10-05 DIAGNOSIS — Z950 Presence of cardiac pacemaker: Secondary | ICD-10-CM

## 2021-10-05 DIAGNOSIS — D72829 Elevated white blood cell count, unspecified: Secondary | ICD-10-CM | POA: Diagnosis present

## 2021-10-05 DIAGNOSIS — N1832 Chronic kidney disease, stage 3b: Secondary | ICD-10-CM | POA: Diagnosis present

## 2021-10-05 DIAGNOSIS — Z794 Long term (current) use of insulin: Secondary | ICD-10-CM

## 2021-10-05 DIAGNOSIS — I480 Paroxysmal atrial fibrillation: Secondary | ICD-10-CM | POA: Diagnosis present

## 2021-10-05 DIAGNOSIS — Z6841 Body Mass Index (BMI) 40.0 and over, adult: Secondary | ICD-10-CM

## 2021-10-05 HISTORY — DX: Acute pancreatitis without necrosis or infection, unspecified: K85.90

## 2021-10-05 LAB — URINALYSIS, ROUTINE W REFLEX MICROSCOPIC
Bacteria, UA: NONE SEEN
Bilirubin Urine: NEGATIVE
Glucose, UA: >=500 mg/dL — AB
Ketones, ur: NEGATIVE mg/dL
Leukocytes,Ua: NEGATIVE
Nitrite: NEGATIVE
Protein, ur: >=300 mg/dL — AB
Specific Gravity, Urine: 1.03 (ref 1.005–1.030)
pH: 5 (ref 5.0–8.0)

## 2021-10-05 LAB — CBC
HCT: 34.8 % — ABNORMAL LOW (ref 36.0–46.0)
Hemoglobin: 10.8 g/dL — ABNORMAL LOW (ref 12.0–15.0)
MCH: 27 pg (ref 26.0–34.0)
MCHC: 31 g/dL (ref 30.0–36.0)
MCV: 87 fL (ref 80.0–100.0)
Platelets: 432 10*3/uL — ABNORMAL HIGH (ref 150–400)
RBC: 4 MIL/uL (ref 3.87–5.11)
RDW: 12.5 % (ref 11.5–15.5)
WBC: 16.5 10*3/uL — ABNORMAL HIGH (ref 4.0–10.5)
nRBC: 0 % (ref 0.0–0.2)

## 2021-10-05 LAB — COMPREHENSIVE METABOLIC PANEL
ALT: 9 U/L (ref 0–44)
AST: 11 U/L — ABNORMAL LOW (ref 15–41)
Albumin: 2.4 g/dL — ABNORMAL LOW (ref 3.5–5.0)
Alkaline Phosphatase: 78 U/L (ref 38–126)
Anion gap: 9 (ref 5–15)
BUN: 9 mg/dL (ref 6–20)
CO2: 27 mmol/L (ref 22–32)
Calcium: 8.7 mg/dL — ABNORMAL LOW (ref 8.9–10.3)
Chloride: 101 mmol/L (ref 98–111)
Creatinine, Ser: 1.63 mg/dL — ABNORMAL HIGH (ref 0.44–1.00)
GFR, Estimated: 36 mL/min — ABNORMAL LOW (ref >60)
Glucose, Bld: 262 mg/dL — ABNORMAL HIGH (ref 70–99)
Potassium: 4.7 mmol/L (ref 3.5–5.1)
Sodium: 137 mmol/L (ref 135–145)
Total Bilirubin: 0.4 mg/dL (ref 0.3–1.2)
Total Protein: 7.5 g/dL (ref 6.5–8.1)

## 2021-10-05 LAB — RESP PANEL BY RT-PCR (FLU A&B, COVID) ARPGX2
Influenza A by PCR: NEGATIVE
Influenza B by PCR: NEGATIVE
SARS Coronavirus 2 by RT PCR: NEGATIVE

## 2021-10-05 LAB — HEMOGLOBIN A1C
Hgb A1c MFr Bld: 8.6 % — ABNORMAL HIGH (ref 4.8–5.6)
Mean Plasma Glucose: 200.12 mg/dL

## 2021-10-05 LAB — LIPASE, BLOOD: Lipase: 297 U/L — ABNORMAL HIGH (ref 11–51)

## 2021-10-05 LAB — TRIGLYCERIDES: Triglycerides: 116 mg/dL (ref <150)

## 2021-10-05 LAB — GLUCOSE, CAPILLARY: Glucose-Capillary: 126 mg/dL — ABNORMAL HIGH (ref 70–99)

## 2021-10-05 MED ORDER — ACETAMINOPHEN 650 MG RE SUPP: 650.0000 mg | Freq: Four times a day (QID) | RECTAL | Status: DC | PRN

## 2021-10-05 MED ORDER — TROLAMINE SALICYLATE 10 % EX CREA
1.0000 "application " | TOPICAL_CREAM | Freq: Two times a day (BID) | CUTANEOUS | Status: DC | PRN
  Filled 2021-10-05: qty 85

## 2021-10-05 MED ORDER — ONDANSETRON HCL 4 MG/2ML IJ SOLN
4.0000 mg | Freq: Four times a day (QID) | INTRAMUSCULAR | Status: DC | PRN
  Administered 2021-10-06 (×2): 4 mg via INTRAVENOUS
  Filled 2021-10-05 (×2): qty 2

## 2021-10-05 MED ORDER — HYDROMORPHONE HCL 1 MG/ML IJ SOLN
0.5000 mg | INTRAMUSCULAR | Status: DC | PRN
Start: 2021-10-05 — End: 2021-10-05
  Administered 2021-10-05: 0.5 mg via INTRAVENOUS
  Filled 2021-10-05: qty 1

## 2021-10-05 MED ORDER — METOCLOPRAMIDE HCL 5 MG/ML IJ SOLN
10.0000 mg | Freq: Once | INTRAMUSCULAR | Status: AC
  Administered 2021-10-05: 10 mg via INTRAVENOUS
  Filled 2021-10-05: qty 2

## 2021-10-05 MED ORDER — ONDANSETRON HCL 4 MG PO TABS
4.0000 mg | ORAL_TABLET | Freq: Four times a day (QID) | ORAL | Status: DC | PRN
  Administered 2021-10-06 – 2021-10-07 (×2): 4 mg via ORAL
  Filled 2021-10-05 (×2): qty 1

## 2021-10-05 MED ORDER — ACETAMINOPHEN 325 MG PO TABS
650.0000 mg | ORAL_TABLET | Freq: Four times a day (QID) | ORAL | Status: DC | PRN
  Administered 2021-10-08: 650 mg via ORAL
  Filled 2021-10-05: qty 2

## 2021-10-05 MED ORDER — LACTATED RINGERS IV SOLN: INTRAVENOUS | Status: DC

## 2021-10-05 MED ORDER — LORATADINE 10 MG PO TABS: 10.0000 mg | ORAL_TABLET | Freq: Every day | ORAL | Status: DC | PRN

## 2021-10-05 MED ORDER — HYDROMORPHONE HCL 1 MG/ML IJ SOLN
1.0000 mg | Freq: Once | INTRAMUSCULAR | Status: AC
Start: 2021-10-05 — End: 2021-10-05
  Administered 2021-10-05: 1 mg via INTRAVENOUS
  Filled 2021-10-05: qty 1

## 2021-10-05 MED ORDER — OXYCODONE HCL 5 MG PO TABS
5.0000 mg | ORAL_TABLET | Freq: Four times a day (QID) | ORAL | Status: DC | PRN
  Administered 2021-10-05 – 2021-10-07 (×5): 5 mg via ORAL
  Filled 2021-10-05 (×5): qty 1

## 2021-10-05 MED ORDER — MUSCLE RUB 10-15 % EX CREA: TOPICAL_CREAM | Freq: Two times a day (BID) | CUTANEOUS | Status: DC | PRN

## 2021-10-05 MED ORDER — SODIUM CHLORIDE 0.9% FLUSH
3.0000 mL | Freq: Two times a day (BID) | INTRAVENOUS | Status: DC
  Administered 2021-10-05 – 2021-10-08 (×5): 3 mL via INTRAVENOUS

## 2021-10-05 MED ORDER — FENTANYL CITRATE PF 50 MCG/ML IJ SOSY: 25.0000 ug | PREFILLED_SYRINGE | INTRAMUSCULAR | Status: DC | PRN

## 2021-10-05 MED ORDER — INSULIN ASPART 100 UNIT/ML IJ SOLN
0.0000 [IU] | Freq: Three times a day (TID) | INTRAMUSCULAR | Status: DC
  Administered 2021-10-06: 3 [IU] via SUBCUTANEOUS

## 2021-10-05 MED ORDER — GABAPENTIN 300 MG PO CAPS
300.0000 mg | ORAL_CAPSULE | Freq: Three times a day (TID) | ORAL | Status: DC
  Administered 2021-10-05 – 2021-10-09 (×13): 300 mg via ORAL
  Filled 2021-10-05 (×13): qty 1

## 2021-10-05 MED ORDER — LACTATED RINGERS IV BOLUS
1000.0000 mL | Freq: Once | INTRAVENOUS | Status: AC
  Administered 2021-10-05: 1000 mL via INTRAVENOUS

## 2021-10-05 MED ORDER — LORAZEPAM 2 MG/ML IJ SOLN
0.5000 mg | Freq: Once | INTRAMUSCULAR | Status: DC | PRN
  Filled 2021-10-05: qty 1

## 2021-10-05 MED ORDER — AMLODIPINE BESYLATE 10 MG PO TABS
10.0000 mg | ORAL_TABLET | Freq: Every day | ORAL | Status: DC
  Administered 2021-10-05 – 2021-10-08 (×4): 10 mg via ORAL
  Filled 2021-10-05 (×4): qty 1

## 2021-10-05 MED ORDER — ENOXAPARIN SODIUM 80 MG/0.8ML IJ SOSY
0.5000 mg/kg | PREFILLED_SYRINGE | INTRAMUSCULAR | Status: DC
  Filled 2021-10-05: qty 0.8

## 2021-10-05 MED ORDER — INSULIN GLARGINE-YFGN 100 UNIT/ML ~~LOC~~ SOLN
50.0000 [IU] | Freq: Every day | SUBCUTANEOUS | Status: DC
  Administered 2021-10-06: 50 [IU] via SUBCUTANEOUS
  Filled 2021-10-05 (×2): qty 0.5

## 2021-10-05 MED ORDER — PRAVASTATIN SODIUM 40 MG PO TABS
40.0000 mg | ORAL_TABLET | Freq: Every day | ORAL | Status: DC
  Administered 2021-10-05 – 2021-10-08 (×4): 40 mg via ORAL
  Filled 2021-10-05 (×4): qty 1

## 2021-10-05 MED ORDER — POLYETHYLENE GLYCOL 3350 17 G PO PACK: 17.0000 g | PACK | Freq: Every day | ORAL | Status: DC | PRN

## 2021-10-05 MED ORDER — APIXABAN 5 MG PO TABS
5.0000 mg | ORAL_TABLET | Freq: Two times a day (BID) | ORAL | Status: DC
  Administered 2021-10-05 – 2021-10-06 (×3): 5 mg via ORAL
  Filled 2021-10-05 (×3): qty 1

## 2021-10-05 NOTE — ED Triage Notes (Signed)
Pt reports pancreatitis flare-up with abd pain and nausea that has been worse over the past 2 weeks.  Seen in ED 10/19 for same.

## 2021-10-05 NOTE — H&P (Signed)
History and Physical    Zanayah Shadowens ZGY:[id number] DOB: 1961/07/16 DOA: 10/05/2021  Referring MD/NP/PA: Alecia Lemming, PA-C PCP: Sue Lush, PA-C  Patient coming from: Home  Chief Complaint: Abdominal pain  I have personally briefly reviewed patient's old medical records in [place]   HPI: Leslie Mullen is a 60 y.o. female with medical history significant of hypertension, hyperlipidemia, PAF on Eliquis, CHB  s/p PPM, diabetes mellitus type 2, and obesity presents with complaints of abdominal pain.  Pain has been present for almost 1 month.  She describes it as a constant pain that is achy at baseline located epigastrically and to the right side upper abdomen.  Pain is worsened and become sharp after she tries to eat or drink anything.    Patient had previously been evaluated in the ED on 10/19 for symptoms. Abdominal ultrasound noted a 5.3 cm lesion adjacent to the pancreatic head.  CT scan of the abdomen and pelvis noted a 4.1 cm low-density lesion in the pancreatic head/body possibly reflecting focal pancreatitis without pancreatic duct dilatation noted and normal gallbladder.  Patient was given fluids, antiemetics, and pain medications with improvement in symptoms.  Patient was discharged home with prescriptions for Zofran and oxycodone for pain.  Since getting home patient reports that the pain had not improved.  In addition to having nausea patient reported that she last vomited Saturday 1 week ago.  Emesis was not bloody and nonbilious in appearance.  She has intermittently had palpitations admitting to missing some doses of Eliquis.  At baseline patient intermittently has some shortness of breath that is unchanged. Recently reports that she has had some constipation and her last bowel movement was 3 days ago.  Her abdominal pain pain has not get better and now seems to wrap around to her back.  The only medication changes that she can recall include being started on Synjardy back  possibly in May of this year, and had been off of Ozempic for 3 months due to it being on back order before it was restarted approximately 2 weeks ago.  ED Course: Upon admission into the emergency department patient was seen to have stable vital signs.  Labs significant for WBC 16.5, hemoglobin 10.8, platelets 432, BUN 9, creatinine 1.63, glucose 264, albumin 2.4, lipase 297, and LFTs within normal limits.  Urinalysis was positive for glucose with 6-10 WBCs, 6-10 RBCs/hpf, no bacteria seen.   Patient was given 1 mg of Dilaudid and Reglan.  TRH called to admit.  Review of Systems  Constitutional:  Positive for chills. Negative for fever.  Eyes:  Negative for photophobia and pain.  Respiratory:  Positive for shortness of breath. Negative for cough.   Cardiovascular:  Positive for palpitations. Negative for chest pain and leg swelling.  Gastrointestinal:  Positive for abdominal pain, constipation and nausea. Negative for diarrhea and vomiting.  Genitourinary:  Negative for dysuria.  Musculoskeletal:  Positive for back pain.  Neurological:  Negative for focal weakness and loss of consciousness.  Psychiatric/Behavioral:  Negative for substance abuse.    Past Medical History:  Diagnosis Date   Abnormal uterine bleeding    Allergy    Anemia    Asthma    Cataract    BOTH EYES   Complete heart block ([place])    a. Medtronic PPM 10/2020.   Diabetes ([place])    type II    Diabetic neuropathy ([place])    Endometrial cancer ([place])    GERD (gastroesophageal reflux disease)  Hyperlipidemia    Hypertension    Morbid obesity ([place])    Osteoarthritis    right knee    Pancreatitis    Pneumonia     Past Surgical History:  Procedure Laterality Date   CERVICAL BIOPSY     CESAREAN SECTION     x3   COLONOSCOPY WITH PROPOFOL N/A 06/29/2017   Procedure: COLONOSCOPY WITH PROPOFOL;  Surgeon: Irene Shipper, MD;  Location: [facility];  Service: Endoscopy;  Laterality: N/A;   CYSTECTOMY Right 2010   Cyst  removed from right groin.    DILATION AND CURETTAGE OF UTERUS N/A 07/29/2021   Procedure: DILATATION AND CURETTAGE OF UTERUS;  Surgeon: Everitt Amber, MD;  Location: [facility];  Service: Gynecology;  Laterality: N/A;   INTRAUTERINE DEVICE (IUD) INSERTION N/A 07/29/2021   Procedure: INTRAUTERINE DEVICE (IUD) INSERTION;  Surgeon: Everitt Amber, MD;  Location: [facility];  Service: Gynecology;  Laterality: N/A;  IUD FROM MAIN PHARMACY   PACEMAKER IMPLANT N/A 10/11/2020   Procedure: PACEMAKER IMPLANT;  Surgeon: Vickie Epley, MD;  Location: [facility];  Service: Cardiovascular;  Laterality: N/A;   TONSILLECTOMY       reports that she has quit smoking. Her smoking use included cigarettes. She has never used smokeless tobacco. She reports that she does not drink alcohol and does not use drugs.  Allergies  Allergen Reactions   Penicillins Shortness Of Breath, Itching and Swelling    Has patient had a PCN reaction causing immediate rash, facial/tongue/throat swelling, SOB or lightheadedness with hypotension: Yes Has patient had a PCN reaction causing severe rash involving mucus membranes or skin necrosis:No Has patient had a PCN reaction that required hospitalization: No Has patient had a PCN reaction occurring within the last 10 years: no If all of the above answers are "NO", then may proceed with Cephalosporin use.      Family History  Adopted: Yes  Problem Relation Age of Onset   Diabetes Father    Heart disease Father    Cancer Mother        colon   Colon cancer Neg Hx    Esophageal cancer Neg Hx    Rectal cancer Neg Hx    Stomach cancer Neg Hx     Prior to Admission medications   Medication Sig Start Date End Date Taking? Authorizing Provider  amLODipine (NORVASC) 10 MG tablet Take 1 tablet (10 mg total) by mouth at bedtime. 01/03/21  Yes [name], MD  apixaban (ELIQUIS) 5 MG TABS tablet Take 1 tablet (5 mg total) by mouth 2 (two) times daily. 09/18/21  Yes Fenton, Clint R,  PA  gabapentin (NEURONTIN) 300 MG capsule Take 1 capsule (300 mg total) by mouth 2 (two) times daily. Patient taking differently: Take 300 mg by mouth 3 (three) times daily. 10/12/20  Yes Evans Lance, MD  insulin degludec (TRESIBA FLEXTOUCH) 200 UNIT/ML FlexTouch Pen Inject 80 Units into the skin every evening. 10/12/20  Yes Evans Lance, MD  lisinopril (ZESTRIL) 20 MG tablet TAKE 1 TABLET(20 MG) BY MOUTH DAILY 06/13/21  Yes Vickie Epley, MD  loratadine (CLARITIN) 10 MG tablet Take 10 mg by mouth daily as needed for allergies.   Yes [provider]  omeprazole (PRILOSEC) 40 MG capsule Take 40 mg by mouth 2 (two) times daily. 09/05/21  Yes [provider]  ondansetron (ZOFRAN ODT) 4 MG disintegrating tablet Take 1 tablet (4 mg total) by mouth every 8 (eight) hours as needed for  nausea or vomiting. 09/25/21  Yes Charlann Lange, PA-C  oxyCODONE-acetaminophen (PERCOCET/ROXICET) 5-325 MG tablet Take 1 tablet by mouth every 6 (six) hours as needed for severe pain. 09/25/21  Yes Upstill, Shari, PA-C  OZEMPIC, 1 MG/DOSE, 4 MG/3ML SOPN Inject 1 mg into the skin once a week. 09/25/21  Yes [provider]  polyethylene glycol (MIRALAX / GLYCOLAX) 17 g packet Take 17 g by mouth daily as needed for moderate constipation.   Yes [provider]  pravastatin (PRAVACHOL) 40 MG tablet TAKE 1 TABLET(40 MG) BY MOUTH DAILY 07/23/20  Yes [name], MD  glucose blood test strip  07/24/15   [provider]  Insulin Pen Needle 31G X 5 MM MISC  08/01/15   [provider]  ONETOUCH DELICA LANCETS 01X MISC  08/01/15   [provider]  OZEMPIC, 2 MG/DOSE, 8 MG/3ML SOPN Inject into the skin. 07/21/21   [provider]  SYNJARDY XR 12.04-999 MG TB24 Take 1 tablet by mouth 2 (two) times daily. 05/30/21   [provider]  traMADol (ULTRAM) 50 MG tablet TAKE 1 TABLET(50 MG) BY MOUTH TWICE DAILY AS NEEDED 09/11/20   [name], MD   trolamine salicylate (ASPERCREME) 10 % cream Apply 1 application topically 2 (two) times daily.    [provider]    Physical Exam:  Constitutional: Middle-age female who appears to be in no acute distress at this time Vitals:   10/05/21 0728  BP: (!) 132/93  Pulse: 90  Resp: 17  Temp: 98 F (36.7 C)  TempSrc: Oral  SpO2: 100%   Eyes: PERRL, lids and conjunctivae normal ENMT: Mucous membranes are moist. Posterior pharynx clear of any exudate or lesions.  Neck: normal, supple, no masses, no thyromegaly Respiratory: clear to auscultation bilaterally, no wheezing, no crackles. Normal respiratory effort. No accessory muscle use.  Cardiovascular: Regular rate and rhythm, no murmurs / rubs / gallops. No extremity edema. 2+ pedal pulses. No carotid bruits.  Abdomen: Moderate tenderness to palpation of the epigastric and right upper quadrant of the abdomen.  Some mild tenderness appreciated to palpation of the left upper quadrant of the abdomen. Musculoskeletal: no clubbing / cyanosis. No joint deformity upper and lower extremities. Good ROM, no contractures. Normal muscle tone.  Skin: no rashes, lesions, ulcers. No induration Neurologic: CN 2-12 grossly intact. Sensation intact, DTR normal. Strength 5/5 in all 4.  Psychiatric: Normal judgment and insight. Alert and oriented x 3. Normal mood.     Labs on Admission: I have personally reviewed following labs and imaging studies  CBC: Recent Labs  Lab 10/05/21 0715  WBC 16.5*  HGB 10.8*  HCT 34.8*  MCV 87.0  PLT 793*   Basic Metabolic Panel: Recent Labs  Lab 10/05/21 0715  NA 137  K 4.7  CL 101  CO2 27  GLUCOSE 262*  BUN 9  CREATININE 1.63*  CALCIUM 8.7*   GFR: CrCl cannot be calculated (Unknown ideal weight.). Liver Function Tests: Recent Labs  Lab 10/05/21 0715  AST 11*  ALT 9  ALKPHOS 78  BILITOT 0.4  PROT 7.5  ALBUMIN 2.4*   Recent Labs  Lab 10/05/21 0715  LIPASE 297*   No results for  input(s): AMMONIA in the last 168 hours. Coagulation Profile: No results for input(s): INR, PROTIME in the last 168 hours. Cardiac Enzymes: No results for input(s): CKTOTAL, CKMB, CKMBINDEX, TROPONINI in the last 168 hours. BNP (last 3 results) No results for input(s): PROBNP in the last  8760 hours. HbA1C: No results for input(s): HGBA1C in the last 72 hours. CBG: No results for input(s): GLUCAP in the last 168 hours. Lipid Profile: No results for input(s): CHOL, HDL, LDLCALC, TRIG, CHOLHDL, LDLDIRECT in the last 72 hours. Thyroid Function Tests: No results for input(s): TSH, T4TOTAL, FREET4, T3FREE, THYROIDAB in the last 72 hours. Anemia Panel: No results for input(s): VITAMINB12, FOLATE, FERRITIN, TIBC, IRON, RETICCTPCT in the last 72 hours. Urine analysis:    Component Value Date/Time   COLORURINE YELLOW 10/05/2021 0715   APPEARANCEUR HAZY (A) 10/05/2021 0715   LABSPEC 1.030 10/05/2021 0715   PHURINE 5.0 10/05/2021 0715   GLUCOSEU >=500 (A) 10/05/2021 0715   HGBUR SMALL (A) 10/05/2021 0715   BILIRUBINUR NEGATIVE 10/05/2021 0715   KETONESUR NEGATIVE 10/05/2021 0715   PROTEINUR >=300 (A) 10/05/2021 0715   NITRITE NEGATIVE 10/05/2021 0715   LEUKOCYTESUR NEGATIVE 10/05/2021 0715   Sepsis Labs: No results found for this or any previous visit (from the past 240 hour(s)).   Radiological Exams on Admission: No results found.  EKG: Independently reviewed.  Sinus rhythm at 81 bpm  Assessment/Plan Abdominal pain secondary to suspect pancreatitis: Acute.  Patient presents with complaints of abdominal pain.  On labs lipase was elevated at 297 down from 452 on 10/19..  Abdominal ultrasound from 10/19 noted possible lesion at the head of the pancreas.  CT scan of the abdomen and pelvis was significant for focal area concerning for pancreatitis on 10/20. -Admit to a medical telemetry bed -Monitor intake and output -Follow-up triglyceride level -Recommend discontinuation of Ozempic as  pancreatitis is a known risk factor -Bolus 1 L lactated Ringer's and then placed on a rate of 165ml/hr -CT scan of the abdomen and pelvis without contrast noted persistent at least 2.5 x 3.8 cm hypodense lesion in the region of the pancreatic head/body for which MRI with and without contrast was recommended for further evaluation of this is pancreatitis versus pancreatic lesion  Leukocytosis and thrombocytosis: Acute.  On admission WBC 16.5 and platelet count 432.  Urinalysis showed no signs of infection. Suspect possibly reactive in nature to above. -Recheck CBC tomorrow morning  Acute kidney injury: Patient presents with creatinine elevated up to 1.63 with BUN 9, but baseline previously had been around 1.3.  Suspect secondary to poor p.o. intake. -IV fluids as noted above -Hold nephrotoxic agents -Recheck kidney function in a.m.  Essential hypertension: Home blood pressure medications include amlodipine 10 mg nightly and lisinopril 20 mg daily. -Held lisinopril due to AKI  -Continue amlodipine  Diabetes mellitus type 2: Patient reports her last hemoglobin A1c was 8.8.  Home insulin regimen includes Tresiba 80 units nightly, Ozempic 1 mg q. weekly, Synjardy 25.04-999 mg twice daily -Hypoglycemic protocols -Sinew -Reduced Tresiba dose to 50 units due to decreased p.o. intake and being in marked control environment -CBGs before every meal with moderate SSI  Paroxysmal atrial fibrillation on chronic anticoagulation: Patient appears to be in sinus rhythm.  Patient admits to intermittently missing doses of Eliquis. -Encourage patient of the need taking Eliquis regularly  Normocytic anemia: Hemoglobin 10.8 which appears near patient's baseline. -Continue to monitor  Complete heart block status post pacemaker  Hyperlipidemia -Continue pravastatin  History of colon polyps and prior history of endometrial cancer  Morbid obesity: BMI 53.23 kg per metered square.  Patient reports that she  has been trying to lose weight. -Continue to encourage weight loss with dietary and lifestyle modification   DVT prophylaxis: Eliquis Code Status: Full Family Communication:  None requested Disposition Plan: Hopefully discharge home once medically stable Consults called: None at this time Admission status: Inpatient, likely require more than 2 midnight stay for IV hydration  Norval Morton MD [facility]   If 7PM-7AM, please contact night-coverage   10/05/2021, 10:41 AM

## 2021-10-05 NOTE — ED Provider Notes (Signed)
[facility] EMERGENCY DEPARTMENT Provider Note   CSN: [id number] Arrival date & time: 10/05/21  0737     History No chief complaint on file.   Leslie Mullen is a 60 y.o. female.  Patient with history of diabetes, chronic kidney disease, obesity, pacemaker for heart block --presents to the emergency department for ongoing abdominal pain and severe nausea.  Patient developed pain in her abdomen about 2 weeks ago.  This was epigastric and right upper quadrant.  Patient was evaluated in the emergency department with right upper quadrant ultrasound, CT imaging, blood work.  She was diagnosed with pancreatitis.  She was discharged home and encouraged to follow-up with her gastroenterologist.  She was given Zofran and Percocet.  Overall she has done poorly.  She reports decreased oral and fluid intake.  No vomiting but pain is bothersome.  It radiates to her back.  No fevers, chest pain or shortness of breath.  No urinary symptoms. The onset of this condition was acute. The course is constant. Aggravating factors: none. Alleviating factors: none.        Past Medical History:  Diagnosis Date   Abnormal uterine bleeding    Allergy    Anemia    Asthma    Cataract    BOTH EYES   Complete heart block ([place])    a. Medtronic PPM 10/2020.   Diabetes ([place])    type II    Diabetic neuropathy ([place])    Endometrial cancer ([place])    GERD (gastroesophageal reflux disease)    Hyperlipidemia    Hypertension    Morbid obesity ([place])    Osteoarthritis    right knee    Pancreatitis    Pneumonia     Patient Active Problem List   Diagnosis Date Noted   Paroxysmal atrial fibrillation ([place]) 09/18/2021   Secondary hypercoagulable state ([place]) 09/18/2021   Endometrial cancer ([place]) 07/29/2021   Heart block 10/10/2020   Syncope 10/10/2020   Type 2 diabetes mellitus with diabetic polyneuropathy, with long-term current use of insulin ([place]) 01/31/2020   Type 2 diabetes mellitus with  hyperglycemia, with long-term current use of insulin ([place]) 01/31/2020   Type 2 diabetes mellitus with stage 3a chronic kidney disease, with long-term current use of insulin ([place]) 01/31/2020   Abnormal feces    Benign neoplasm of ascending colon    Benign neoplasm of cecum    Benign neoplasm of transverse colon     Past Surgical History:  Procedure Laterality Date   CERVICAL BIOPSY     CESAREAN SECTION     x3   COLONOSCOPY WITH PROPOFOL N/A 06/29/2017   Procedure: COLONOSCOPY WITH PROPOFOL;  Surgeon: Irene Shipper, MD;  [facility];  Service: Endoscopy;  Laterality: N/A;   CYSTECTOMY Right 2010   Cyst removed from right groin.    DILATION AND CURETTAGE OF UTERUS N/A 07/29/2021   Procedure: DILATATION AND CURETTAGE OF UTERUS;  Surgeon: Everitt Amber, MD;  Location: [facility];  Service: Gynecology;  Laterality: N/A;   INTRAUTERINE DEVICE (IUD) INSERTION N/A 07/29/2021   Procedure: INTRAUTERINE DEVICE (IUD) INSERTION;  Surgeon: Everitt Amber, MD;  Location: [facility];  Service: Gynecology;  Laterality: N/A;  IUD FROM MAIN PHARMACY   PACEMAKER IMPLANT N/A 10/11/2020   Procedure: PACEMAKER IMPLANT;  Surgeon: Vickie Epley, MD;  Location: [place] [facility];  Service: Cardiovascular;  Laterality: N/A;   TONSILLECTOMY       OB History   No obstetric history on file.  Family History  Adopted: Yes  Problem Relation Age of Onset   Diabetes Father    Heart disease Father    Cancer Mother        colon   Colon cancer Neg Hx    Esophageal cancer Neg Hx    Rectal cancer Neg Hx    Stomach cancer Neg Hx     Social History   Tobacco Use   Smoking status: Former    Types: Cigarettes   Smokeless tobacco: Never   Tobacco comments:    Former smoker (09/18/2021)  Vaping Use   Vaping Use: Never used  Substance Use Topics   Alcohol use: No    Alcohol/week: 0.0 standard drinks   Drug use: No    Home Medications Prior to Admission medications   Medication Sig Start Date End  Date Taking? Authorizing Provider  amLODipine (NORVASC) 10 MG tablet Take 1 tablet (10 mg total) by mouth at bedtime. 01/03/21   [name], MD  apixaban (ELIQUIS) 5 MG TABS tablet Take 1 tablet (5 mg total) by mouth 2 (two) times daily. 09/18/21   Fenton, Clint R, PA  gabapentin (NEURONTIN) 300 MG capsule Take 1 capsule (300 mg total) by mouth 2 (two) times daily. 10/12/20   Evans Lance, MD  glucose blood test strip  07/24/15   [provider]  insulin degludec (TRESIBA FLEXTOUCH) 200 UNIT/ML FlexTouch Pen Inject 80 Units into the skin every evening. 10/12/20   Evans Lance, MD  Insulin Pen Needle 31G X 5 MM MISC  08/01/15   [provider]  lisinopril (ZESTRIL) 20 MG tablet TAKE 1 TABLET(20 MG) BY MOUTH DAILY 06/13/21   Vickie Epley, MD  loratadine (CLARITIN) 10 MG tablet Take 10 mg by mouth daily as needed for allergies.    [provider]  omeprazole (PRILOSEC) 40 MG capsule Take 40 mg by mouth 2 (two) times daily. 09/05/21   [provider]  ondansetron (ZOFRAN ODT) 4 MG disintegrating tablet Take 1 tablet (4 mg total) by mouth every 8 (eight) hours as needed for nausea or vomiting. 09/25/21   Charlann Lange, PA-C  ONETOUCH DELICA LANCETS 08M MISC  08/01/15   [provider]  oxyCODONE-acetaminophen (PERCOCET/ROXICET) 5-325 MG tablet Take 1 tablet by mouth every 6 (six) hours as needed for severe pain. 09/25/21   Upstill, Shari, PA-C  OZEMPIC, 2 MG/DOSE, 8 MG/3ML SOPN Inject into the skin. 07/21/21   [provider]  polyethylene glycol (MIRALAX / GLYCOLAX) 17 g packet Take 17 g by mouth daily.    [provider]  pravastatin (PRAVACHOL) 40 MG tablet TAKE 1 TABLET(40 MG) BY MOUTH DAILY 07/23/20   [name], MD  SYNJARDY XR 12.04-999 MG TB24 Take 1 tablet by mouth 2 (two) times daily. 05/30/21   [provider]  traMADol (ULTRAM) 50 MG tablet TAKE 1 TABLET(50 MG) BY MOUTH TWICE DAILY AS NEEDED 09/11/20    [name], MD  trolamine salicylate (ASPERCREME) 10 % cream Apply 1 application topically 2 (two) times daily.    [provider]    Allergies    Penicillins  Review of Systems   Review of Systems  Constitutional:  Negative for fever.  HENT:  Negative for rhinorrhea and sore throat.   Eyes:  Negative for redness.  Respiratory:  Negative for cough.   Cardiovascular:  Negative for chest pain.  Gastrointestinal:  Positive for abdominal pain and nausea. Negative for constipation, diarrhea and vomiting.  Genitourinary:  Negative for dysuria, frequency, hematuria and urgency.  Musculoskeletal:  Positive for back pain. Negative for myalgias.  Skin:  Negative for rash.  Neurological:  Negative for headaches.   Physical Exam Updated Vital Signs BP (!) 132/93 (BP Location: Right Arm)   Pulse 90   Temp 98 F (36.7 C) (Oral)   Resp 17   SpO2 100%   Physical Exam Vitals and nursing note reviewed.  Constitutional:      General: She is not in acute distress.    Appearance: She is well-developed.  HENT:     Head: Normocephalic and atraumatic.     Right Ear: External ear normal.     Left Ear: External ear normal.     Nose: Nose normal.  Eyes:     Conjunctiva/sclera: Conjunctivae normal.  Cardiovascular:     Rate and Rhythm: Normal rate and regular rhythm.     Heart sounds: No murmur heard. Pulmonary:     Effort: No respiratory distress.     Breath sounds: No wheezing, rhonchi or rales.  Abdominal:     Palpations: Abdomen is soft.     Tenderness: There is abdominal tenderness. There is no guarding or rebound.     Comments: Patient tender to palpation in the epigastric, right upper quadrant and periumbilical areas.  No rebound or guarding.  Musculoskeletal:     Cervical back: Normal range of motion and neck supple.     Right lower leg: No edema.     Left lower leg: No edema.  Skin:    General: Skin is warm and dry.     Findings: No rash.  Neurological:      General: No focal deficit present.     Mental Status: She is alert. Mental status is at baseline.     Motor: No weakness.  Psychiatric:        Mood and Affect: Mood normal.    ED Results / Procedures / Treatments   Labs (all labs ordered are listed, but only abnormal results are displayed) Labs Reviewed  LIPASE, BLOOD - Abnormal; Notable for the following components:      Result Value   Lipase 297 (*)    All other components within normal limits  COMPREHENSIVE METABOLIC PANEL - Abnormal; Notable for the following components:   Glucose, Bld 262 (*)    Creatinine, Ser 1.63 (*)    Calcium 8.7 (*)    Albumin 2.4 (*)    AST 11 (*)    GFR, Estimated 36 (*)    All other components within normal limits  CBC - Abnormal; Notable for the following components:   WBC 16.5 (*)    Hemoglobin 10.8 (*)    HCT 34.8 (*)    Platelets 432 (*)    All other components within normal limits  RESP PANEL BY RT-PCR (FLU A&B, COVID) ARPGX2  URINALYSIS, ROUTINE W REFLEX MICROSCOPIC  TRIGLYCERIDES    EKG None  Radiology No results found.  Procedures Procedures   Medications Ordered in ED Medications  HYDROmorphone (DILAUDID) injection 1 mg (has no administration in time range)  metoCLOPramide (REGLAN) injection 10 mg (has no administration in time range)    ED Course  I have reviewed the triage vital signs and the nursing notes.  Pertinent labs & imaging results that were available during my care of the patient were reviewed by me and considered in my medical decision making (see chart for details).  Patient seen and examined. Work-up ordered in triage  reviewed.  Reviewed work-up from visit on 09/24/2021.  Patient has had persistent symptoms consistent with acute pancreatitis.  Added triglyceride level.  Suspect that symptoms may be due to one of her diabetes medications.  Regardless, given that symptoms are persistent and patient does not have a history of pancreatitis, feel that she needs  to be admitted today for further evaluation and symptom control.  She is in agreement.  Vital signs reviewed and are as follows: BP (!) 132/93 (BP Location: Right Arm)   Pulse 90   Temp 98 F (36.7 C) (Oral)   Resp 17   SpO2 100%   Discussed case with Dr. Tamala Julian of [facility] who will see for admission.     MDM Rules/Calculators/A&P                           Patient with acute pancreatitis.  Persistent symptoms, admit for further work-up and symptom control.   Final Clinical Impression(s) / ED Diagnoses Final diagnoses:  Acute pancreatitis, unspecified complication status, unspecified pancreatitis type    Rx / DC Orders ED Discharge Orders     None        Carlisle Cater, PA-C 10/05/21 1157    Varney Biles, MD 10/07/21 1729

## 2021-10-05 NOTE — ED Notes (Signed)
RN ordered pt a meal tray

## 2021-10-05 NOTE — ED Notes (Signed)
Patient transported to CT 

## 2021-10-06 ENCOUNTER — Inpatient Hospital Stay (HOSPITAL_COMMUNITY): Payer: 59

## 2021-10-06 DIAGNOSIS — Z7901 Long term (current) use of anticoagulants: Secondary | ICD-10-CM | POA: Diagnosis not present

## 2021-10-06 DIAGNOSIS — N179 Acute kidney failure, unspecified: Secondary | ICD-10-CM | POA: Diagnosis not present

## 2021-10-06 DIAGNOSIS — I480 Paroxysmal atrial fibrillation: Secondary | ICD-10-CM | POA: Diagnosis not present

## 2021-10-06 DIAGNOSIS — K859 Acute pancreatitis without necrosis or infection, unspecified: Secondary | ICD-10-CM | POA: Diagnosis not present

## 2021-10-06 LAB — CBC
HCT: 30.2 % — ABNORMAL LOW (ref 36.0–46.0)
Hemoglobin: 9.3 g/dL — ABNORMAL LOW (ref 12.0–15.0)
MCH: 26.7 pg (ref 26.0–34.0)
MCHC: 30.8 g/dL (ref 30.0–36.0)
MCV: 86.8 fL (ref 80.0–100.0)
Platelets: 314 10*3/uL (ref 150–400)
RBC: 3.48 MIL/uL — ABNORMAL LOW (ref 3.87–5.11)
RDW: 12.5 % (ref 11.5–15.5)
WBC: 15.3 10*3/uL — ABNORMAL HIGH (ref 4.0–10.5)
nRBC: 0 % (ref 0.0–0.2)

## 2021-10-06 LAB — BASIC METABOLIC PANEL
Anion gap: 9 (ref 5–15)
BUN: 10 mg/dL (ref 6–20)
CO2: 25 mmol/L (ref 22–32)
Calcium: 8.3 mg/dL — ABNORMAL LOW (ref 8.9–10.3)
Chloride: 102 mmol/L (ref 98–111)
Creatinine, Ser: 1.4 mg/dL — ABNORMAL HIGH (ref 0.44–1.00)
GFR, Estimated: 43 mL/min — ABNORMAL LOW (ref >60)
Glucose, Bld: 198 mg/dL — ABNORMAL HIGH (ref 70–99)
Potassium: 4.2 mmol/L (ref 3.5–5.1)
Sodium: 136 mmol/L (ref 135–145)

## 2021-10-06 LAB — MAGNESIUM: Magnesium: 1.8 mg/dL (ref 1.7–2.4)

## 2021-10-06 LAB — APTT: aPTT: 27 seconds (ref 24–36)

## 2021-10-06 LAB — GLUCOSE, CAPILLARY
Glucose-Capillary: 155 mg/dL — ABNORMAL HIGH (ref 70–99)
Glucose-Capillary: 92 mg/dL (ref 70–99)
Glucose-Capillary: 85 mg/dL (ref 70–99)
Glucose-Capillary: 78 mg/dL (ref 70–99)

## 2021-10-06 LAB — HEPARIN LEVEL (UNFRACTIONATED): Heparin Unfractionated: >1.1 IU/mL — ABNORMAL HIGH (ref 0.30–0.70)

## 2021-10-06 LAB — PROTIME-INR
INR: 1.5 — ABNORMAL HIGH (ref 0.8–1.2)
Prothrombin Time: 18 seconds — ABNORMAL HIGH (ref 11.4–15.2)

## 2021-10-06 MED ORDER — GADOBUTROL 1 MMOL/ML IV SOLN
10.0000 mL | Freq: Once | INTRAVENOUS | Status: AC | PRN
  Administered 2021-10-06: 10 mL via INTRAVENOUS

## 2021-10-06 MED ORDER — HEPARIN (PORCINE) 25000 UT/250ML-% IV SOLN
15.0000 [IU]/kg/h | INTRAVENOUS | Status: DC
  Administered 2021-10-06: 15 [IU]/kg/h via INTRAVENOUS
  Filled 2021-10-06: qty 250

## 2021-10-06 MED ORDER — INSULIN ASPART 100 UNIT/ML IJ SOLN
0.0000 [IU] | INTRAMUSCULAR | Status: DC
  Administered 2021-10-07: 5 [IU] via SUBCUTANEOUS
  Administered 2021-10-07: 2 [IU] via SUBCUTANEOUS
  Administered 2021-10-07: 5 [IU] via SUBCUTANEOUS
  Administered 2021-10-08 (×2): 2 [IU] via SUBCUTANEOUS
  Administered 2021-10-08: 3 [IU] via SUBCUTANEOUS
  Administered 2021-10-08: 2 [IU] via SUBCUTANEOUS
  Administered 2021-10-08: 3 [IU] via SUBCUTANEOUS

## 2021-10-06 NOTE — Progress Notes (Signed)
Changed device settings for MRI to  ODO   Tachy-therapies to off if applicable.   Program device back to pre-MRI settings after completion of exam.

## 2021-10-06 NOTE — Progress Notes (Signed)
PROGRESS NOTE        PATIENT DETAILS Name: Leslie Mullen Age: 60 y.o. Sex: female Date of Birth: 1961/08/13 Admit Date: 10/05/2021 Admitting Physician Norval Morton, MD EXH:BZJIR, Lu Duffel, PA-C  Brief Narrative: Patient is a 60 y.o. female with history of HTN, HLD, PAF, complete heart block-s/p PPM implantation November 2021-presented with epigastric pain for approximately 1 month-she was thought to have pancreatitis-and subsequently admitted to the hospitalist service.  Subjective: Continues to have abdominal pain-mostly epigastric/upper abdomen-7/10.  Objective: Vitals: Blood pressure (!) 156/85, pulse 86, temperature 98.9 F (37.2 C), temperature source Oral, resp. rate 15, height 5\' 8"  (1.727 m), weight (!) 152.7 kg, SpO2 94 %.   Exam: Gen Exam:Alert awake-not in any distress HEENT:atraumatic, normocephalic Chest: B/L clear to auscultation anteriorly CVS:S1S2 regular Abdomen: Continues to have upper abdominal tenderness-no peritoneal signs. Extremities:no edema Neurology: Non focal Skin: no rash  Pertinent Labs/Radiology: WBC: 15.3 Hb: 9.3 Na: 136 K: 4.2 Creatinine: 1.4  10/19>> RUQ ultrasound: No cholelithiasis, 5.3 cm lesion adjacent to the pancreatic head. 10/20>> CT abdomen/pelvis: 4.1 cm lesion in pancreatic head/body. 10/30>> CT abdomen/pelvis: 2.5 x 3.8 cm lesion in the pancreatic head/body.   Assessment/Plan: Acute pancreatitis: No gallstones on RUQ ultrasound-no history of alcohol use.  Concerned that this may be pancreatic mass/malignancy (symptoms ongoing for at least 1 month).  Await MRCP-following which GI likely will need to be consulted.  In the meantime-keep n.p.o.-continued IV fluid hydration other supportive care.    Leukocytosis: Likely due to pancreatic inflammation-no other sources of infection apparent.  Follow.  AKI: Hemodynamically mediated-improving-follow closely.  PAF: Maintaining sinus rhythm-continue  Eliquis.  History of complete heart block-s/p PPM placement: Continue telemetry monitoring  HTN: BP stable-continue amlodipine.  DM-2 (A1C 8.6): CBG stable-continue Lantus 50 units daily and SSI.  Follow and adjust  Recent Labs    10/05/21 2209 10/06/21 0826  GLUCAP 126* 155*    History of colon polyps/GERD: Claims recently had EGD/colonoscopy at [place].  Resume outpatient follow-up with GI as previous.  Morbid Obesity: Estimated body mass index is 51.19 kg/m as calculated from the following:   Height as of this encounter: 5\' 8"  (1.727 m).   Weight as of this encounter: 152.7 kg.    Procedures: None Consults: None DVT Prophylaxis: Eliquis Code Status:Full code Family Communication: None at bedside  Time spent: 35 minutes-Greater than 50% of this time was spent in counseling, explanation of diagnosis, planning of further management, and coordination of care.  Diet: Diet Order             Diet NPO time specified Except for: Sips with Meds, Ice Chips  Diet effective now                      Disposition Plan: Status is: Inpatient  Remains inpatient appropriate because: Acute pancreatitis-still with abdominal pain-n.p.o. status.  Work-up in progress-see above.   Antimicrobial agents: Anti-infectives (From admission, onward)    None        MEDICATIONS: Scheduled Meds:  amLODipine  10 mg Oral QHS   apixaban  5 mg Oral BID   gabapentin  300 mg Oral TID   insulin aspart  0-15 Units Subcutaneous TID WC   insulin glargine-yfgn  50 Units Subcutaneous QHS   pravastatin  40 mg Oral q1800   sodium chloride flush  3 mL Intravenous Q12H   Continuous Infusions:  lactated ringers 125 mL/hr at 10/06/21 0901   PRN Meds:.acetaminophen **OR** acetaminophen, fentaNYL (SUBLIMAZE) injection, loratadine, LORazepam, Muscle Rub, ondansetron **OR** ondansetron (ZOFRAN) IV, oxyCODONE, polyethylene glycol   I have personally reviewed following labs and imaging  studies  LABORATORY DATA: CBC: Recent Labs  Lab 10/05/21 0715 10/06/21 0148  WBC 16.5* 15.3*  HGB 10.8* 9.3*  HCT 34.8* 30.2*  MCV 87.0 86.8  PLT 432* 709    Basic Metabolic Panel: Recent Labs  Lab 10/05/21 0715 10/06/21 0148  NA 137 136  K 4.7 4.2  CL 101 102  CO2 27 25  GLUCOSE 262* 198*  BUN 9 10  CREATININE 1.63* 1.40*  CALCIUM 8.7* 8.3*  MG  --  1.8    GFR: Estimated Creatinine Clearance: 67.9 mL/min (A) (by C-G formula based on SCr of 1.4 mg/dL (H)).  Liver Function Tests: Recent Labs  Lab 10/05/21 0715  AST 11*  ALT 9  ALKPHOS 78  BILITOT 0.4  PROT 7.5  ALBUMIN 2.4*   Recent Labs  Lab 10/05/21 0715  LIPASE 297*   No results for input(s): AMMONIA in the last 168 hours.  Coagulation Profile: No results for input(s): INR, PROTIME in the last 168 hours.  Cardiac Enzymes: No results for input(s): CKTOTAL, CKMB, CKMBINDEX, TROPONINI in the last 168 hours.  BNP (last 3 results) No results for input(s): PROBNP in the last 8760 hours.  Lipid Profile: Recent Labs    10/05/21 0957  TRIG 116    Thyroid Function Tests: No results for input(s): TSH, T4TOTAL, FREET4, T3FREE, THYROIDAB in the last 72 hours.  Anemia Panel: No results for input(s): VITAMINB12, FOLATE, FERRITIN, TIBC, IRON, RETICCTPCT in the last 72 hours.  Urine analysis:    Component Value Date/Time   COLORURINE YELLOW 10/05/2021 0715   APPEARANCEUR HAZY (A) 10/05/2021 0715   LABSPEC 1.030 10/05/2021 0715   PHURINE 5.0 10/05/2021 0715   GLUCOSEU >=500 (A) 10/05/2021 0715   HGBUR SMALL (A) 10/05/2021 0715   BILIRUBINUR NEGATIVE 10/05/2021 0715   KETONESUR NEGATIVE 10/05/2021 0715   PROTEINUR >=300 (A) 10/05/2021 0715   NITRITE NEGATIVE 10/05/2021 0715   LEUKOCYTESUR NEGATIVE 10/05/2021 0715    Sepsis Labs: Lactic Acid, Venous No results found for: LATICACIDVEN  MICROBIOLOGY: Recent Results (from the past 240 hour(s))  Resp Panel by RT-PCR (Flu A&B, Covid)  Nasopharyngeal Swab     Status: None   Collection Time: 10/05/21 10:00 AM   Specimen: Nasopharyngeal Swab; Nasopharyngeal(NP) swabs in vial transport medium  Result Value Ref Range Status   SARS Coronavirus 2 by RT PCR NEGATIVE NEGATIVE Final    Comment: (NOTE) SARS-CoV-2 target nucleic acids are NOT DETECTED.  The SARS-CoV-2 RNA is generally detectable in upper respiratory specimens during the acute phase of infection. The lowest concentration of SARS-CoV-2 viral copies this assay can detect is 138 copies/mL. A negative result does not preclude SARS-Cov-2 infection and should not be used as the sole basis for treatment or other patient management decisions. A negative result may occur with  improper specimen collection/handling, submission of specimen other than nasopharyngeal swab, presence of viral mutation(s) within the areas targeted by this assay, and inadequate number of viral copies(<138 copies/mL). A negative result must be combined with clinical observations, patient history, and epidemiological information. The expected result is Negative.  Fact Sheet for Patients:  EntrepreneurPulse.com.au  Fact Sheet for Healthcare Providers:  IncredibleEmployment.be  This test is no t yet approved or cleared by  the [employer] and  has been authorized for detection and/or diagnosis of SARS-CoV-2 by FDA under an Emergency Use Authorization (EUA). This EUA will remain  in effect (meaning this test can be used) for the duration of the COVID-19 declaration under Section 564(b)(1) of the Act, 21 U.S.C.section 360bbb-3(b)(1), unless the authorization is terminated  or revoked sooner.       Influenza A by PCR NEGATIVE NEGATIVE Final   Influenza B by PCR NEGATIVE NEGATIVE Final    Comment: (NOTE) The Xpert Xpress SARS-CoV-2/FLU/RSV plus assay is intended as an aid in the diagnosis of influenza from Nasopharyngeal swab specimens and should not be  used as a sole basis for treatment. Nasal washings and aspirates are unacceptable for Xpert Xpress SARS-CoV-2/FLU/RSV testing.  Fact Sheet for Patients: EntrepreneurPulse.com.au  Fact Sheet for Healthcare Providers: IncredibleEmployment.be  This test is not yet approved or cleared by the [place] FDA and has been authorized for detection and/or diagnosis of SARS-CoV-2 by FDA under an Emergency Use Authorization (EUA). This EUA will remain in effect (meaning this test can be used) for the duration of the COVID-19 declaration under Section 564(b)(1) of the Act, 21 U.S.C. section 360bbb-3(b)(1), unless the authorization is terminated or revoked.  Performed at [facility], [place] [address], [place], [place] [address]     RADIOLOGY STUDIES/RESULTS: CT ABDOMEN PELVIS WO CONTRAST  Result Date: 10/05/2021 CLINICAL DATA:  Abdominal pain, acute, nonlocalized. R sided rib pain for one week EXAM: CT ABDOMEN AND PELVIS WITHOUT CONTRAST TECHNIQUE: Multidetector CT imaging of the abdomen and pelvis was performed following the standard protocol without IV contrast. COMPARISON:  CT abdomen pelvis 09/25/2021, ultrasound abdomen 09/24/2021 FINDINGS: Lower chest: Cardiac leads partially noted. Hepatobiliary: No focal liver abnormality. No gallstones, gallbladder wall thickening, or pericholecystic fluid. No biliary dilatation. Pancreas: Difficult to visualize persistent at least 2.5 x 3.8 cm hypodense lesion within the region of the pancreatic head/body. Normal pancreatic contour. No surrounding inflammatory changes. No main pancreatic ductal dilatation. Spleen: Normal in size without focal abnormality. Adrenals/Urinary Tract: No adrenal nodule bilaterally. No nephrolithiasis and no hydronephrosis. No definite contour-deforming renal mass. No ureterolithiasis or hydroureter. The urinary bladder is unremarkable. Stomach/Bowel: Stomach is within normal limits. No  evidence of bowel wall thickening or dilatation. Scattered colonic diverticulosis. Appendix appears normal. Vascular/Lymphatic: No intraperitoneal free fluid. No intraperitoneal free gas. No organized fluid collection. Reproductive: A t shaped intrauterine device is noted within the uterus in grossly appropriate position. Uterus and bilateral adnexa are unremarkable. Other: No intraperitoneal free fluid. No intraperitoneal free gas. No organized fluid collection. Musculoskeletal: There is a 2 cm fat containing umbilical hernia. No suspicious lytic or blastic osseous lesions. No acute displaced fracture. Grade 1 anterolisthesis of L4 on L5. Multilevel degenerative changes of the spine. IMPRESSION: 1. Difficult to visualize on this noncontrast study persistent at least 2.5 x 3.8 cm hypodense lesion within the region of the pancreatic head/body. Recommend nonemergent MRI pancreatic protocol further evaluation. When the patient is clinically stable and able to follow directions and hold their breath (preferably as an outpatient) further evaluation with dedicated abdominal MRI should be considered. 2. Scattered colonic diverticulosis with no acute diverticulitis. 3. T-shaped intrauterine device in grossly appropriate position. 4.  Aortic Atherosclerosis (ICD10-I70.0). Electronically Signed   By: Iven Finn M.D.   On: 10/05/2021 15:47     LOS: 1 day   Oren Binet, MD  [facility]    To contact the attending provider between 7A-7P or the  covering provider during after hours 7P-7A, please log into the web site [email or web address] and access using universal [place] password for that web site. If you do not have the password, please call the hospital operator.  10/06/2021, 12:16 PM

## 2021-10-06 NOTE — Progress Notes (Signed)
Pt admitted from ED. Report wasn't received. Pt is AAOx4; RA; VSS; c/o abd pain. PIV SL; skin intact. Pt oriented to unit, room and call bell instruction given. Bed is low and brakes are locked. Personal items within reach.

## 2021-10-06 NOTE — Significant Event (Signed)
CBG 78, pt NPO.  Hold lantus, will switch SSI to Q4H.

## 2021-10-06 NOTE — Progress Notes (Signed)
ANTICOAGULATION CONSULT NOTE - Initial Consult  Pharmacy Consult for transition from Eliquis to heparin Indication: atrial fibrillation  Allergies  Allergen Reactions   Penicillins Shortness Of Breath, Itching and Swelling    Has patient had a PCN reaction causing immediate rash, facial/tongue/throat swelling, SOB or lightheadedness with hypotension: Yes Has patient had a PCN reaction causing severe rash involving mucus membranes or skin necrosis:No Has patient had a PCN reaction that required hospitalization: No Has patient had a PCN reaction occurring within the last 10 years: no If all of the above answers are "NO", then may proceed with Cephalosporin use.      Patient Measurements: Height: 5\' 8"  (172.7 cm) Weight: (!) 152.7 kg (336 lb 10.3 oz) IBW/kg (Calculated) : 63.9 Heparin Dosing Weight: 101.7 kg  Vital Signs: Temp: 98.9 F (37.2 C) (10/31 1621) Temp Source: Oral (10/31 1621) BP: 130/92 (10/31 1621) Pulse Rate: 88 (10/31 1621)  Labs: Recent Labs    10/05/21 0715 10/06/21 0148 10/06/21 1637  HGB 10.8* 9.3*  --   HCT 34.8* 30.2*  --   PLT 432* 314  --   APTT  --   --  27  LABPROT  --   --  18.0*  INR  --   --  1.5*  HEPARINUNFRC  --   --  >1.10*  CREATININE 1.63* 1.40*  --     Estimated Creatinine Clearance: 67.9 mL/min (A) (by C-G formula based on SCr of 1.4 mg/dL (H)).   Medical History: Past Medical History:  Diagnosis Date   Abnormal uterine bleeding    Allergy    Anemia    Asthma    Cataract    BOTH EYES   Complete heart block ([place])    a. [employer] PPM 10/2020.   Diabetes ([place])    type II    Diabetic neuropathy ([facility])    Endometrial cancer ([facility])    GERD (gastroesophageal reflux disease)    Hyperlipidemia    Hypertension    Morbid obesity ([facility])    Osteoarthritis    right knee    Pancreatitis    Pneumonia     Medications:  Scheduled:   amLODipine  10 mg Oral QHS   gabapentin  300 mg Oral TID   insulin aspart  0-15 Units  Subcutaneous TID WC   insulin glargine-yfgn  50 Units Subcutaneous QHS   pravastatin  40 mg Oral q1800   sodium chloride flush  3 mL Intravenous Q12H    Assessment: Patient is newly diagnosed pancreatic cancer in need of a biopsy on Eliquis due to A. Fib. Heparin infusion will start 12 hours after her last dose of Eliquis (09:00 on 10/06/2021) without a bolus administered. APTT, Heparin level, and an INR have been ordered STAT. CBC from this morning on file and reviewed.   Goal of Therapy:  Heparin level 0.3-0.7 units/ml APTT level: 66-102 APTT and heparin level do not correlate. Will titrate using APTT levels Monitor platelets by anticoagulation protocol: Yes   Plan:  Start heparin infusion at 1550 units/hr Continue to monitor H&H and platelets  Taralee Marcus BS, PharmD, BCPS Clinical Pharmacist 10/06/2021,4:25 PM

## 2021-10-06 NOTE — Progress Notes (Signed)
Informed of MRI for today.   Device system confirmed to be MRI conditional, with implant date > 6 weeks ago, and no evidence of abandoned or epicardial leads in review of most recent CXR Interrogation from today reviewed, pt is currently AS-VS with minimal V pacing.   Change device settings for MRI to  ODO  Tachy-therapies to off if applicable.  Program device back to pre-MRI settings after completion of exam.  Annamaria Helling  10/06/2021 1:57 PM

## 2021-10-07 DIAGNOSIS — K859 Acute pancreatitis without necrosis or infection, unspecified: Secondary | ICD-10-CM | POA: Diagnosis not present

## 2021-10-07 DIAGNOSIS — N179 Acute kidney failure, unspecified: Secondary | ICD-10-CM | POA: Diagnosis not present

## 2021-10-07 DIAGNOSIS — Z7901 Long term (current) use of anticoagulants: Secondary | ICD-10-CM | POA: Diagnosis not present

## 2021-10-07 DIAGNOSIS — I480 Paroxysmal atrial fibrillation: Secondary | ICD-10-CM | POA: Diagnosis not present

## 2021-10-07 LAB — CBC
HCT: 29.9 % — ABNORMAL LOW (ref 36.0–46.0)
Hemoglobin: 9.2 g/dL — ABNORMAL LOW (ref 12.0–15.0)
MCH: 26.6 pg (ref 26.0–34.0)
MCHC: 30.8 g/dL (ref 30.0–36.0)
MCV: 86.4 fL (ref 80.0–100.0)
Platelets: 304 10*3/uL (ref 150–400)
RBC: 3.46 MIL/uL — ABNORMAL LOW (ref 3.87–5.11)
RDW: 12.8 % (ref 11.5–15.5)
WBC: 13.7 10*3/uL — ABNORMAL HIGH (ref 4.0–10.5)
nRBC: 0 % (ref 0.0–0.2)

## 2021-10-07 LAB — GLUCOSE, CAPILLARY
Glucose-Capillary: 107 mg/dL — ABNORMAL HIGH (ref 70–99)
Glucose-Capillary: 110 mg/dL — ABNORMAL HIGH (ref 70–99)
Glucose-Capillary: 117 mg/dL — ABNORMAL HIGH (ref 70–99)
Glucose-Capillary: 195 mg/dL — ABNORMAL HIGH (ref 70–99)
Glucose-Capillary: 253 mg/dL — ABNORMAL HIGH (ref 70–99)
Glucose-Capillary: 263 mg/dL — ABNORMAL HIGH (ref 70–99)

## 2021-10-07 LAB — COMPREHENSIVE METABOLIC PANEL
ALT: 10 U/L (ref 0–44)
AST: 14 U/L — ABNORMAL LOW (ref 15–41)
Albumin: 2 g/dL — ABNORMAL LOW (ref 3.5–5.0)
Alkaline Phosphatase: 58 U/L (ref 38–126)
Anion gap: 6 (ref 5–15)
BUN: 9 mg/dL (ref 6–20)
CO2: 27 mmol/L (ref 22–32)
Calcium: 8.4 mg/dL — ABNORMAL LOW (ref 8.9–10.3)
Chloride: 104 mmol/L (ref 98–111)
Creatinine, Ser: 1.43 mg/dL — ABNORMAL HIGH (ref 0.44–1.00)
GFR, Estimated: 42 mL/min — ABNORMAL LOW (ref >60)
Glucose, Bld: 100 mg/dL — ABNORMAL HIGH (ref 70–99)
Potassium: 4.1 mmol/L (ref 3.5–5.1)
Sodium: 137 mmol/L (ref 135–145)
Total Bilirubin: 0.4 mg/dL (ref 0.3–1.2)
Total Protein: 6.4 g/dL — ABNORMAL LOW (ref 6.5–8.1)

## 2021-10-07 LAB — APTT
aPTT: 49 seconds — ABNORMAL HIGH (ref 24–36)
aPTT: 56 seconds — ABNORMAL HIGH (ref 24–36)
aPTT: 54 seconds — ABNORMAL HIGH (ref 24–36)

## 2021-10-07 LAB — LIPASE, BLOOD: Lipase: 124 U/L — ABNORMAL HIGH (ref 11–51)

## 2021-10-07 LAB — HEPARIN LEVEL (UNFRACTIONATED)
Heparin Unfractionated: >1.1 IU/mL — ABNORMAL HIGH (ref 0.30–0.70)
Heparin Unfractionated: >1.1 IU/mL — ABNORMAL HIGH (ref 0.30–0.70)

## 2021-10-07 MED ORDER — PANTOPRAZOLE SODIUM 40 MG PO TBEC
40.0000 mg | DELAYED_RELEASE_TABLET | Freq: Every day | ORAL | Status: DC
  Administered 2021-10-07 – 2021-10-09 (×3): 40 mg via ORAL
  Filled 2021-10-07 (×3): qty 1

## 2021-10-07 MED ORDER — OXYCODONE HCL 5 MG PO TABS
10.0000 mg | ORAL_TABLET | Freq: Four times a day (QID) | ORAL | Status: DC | PRN
  Administered 2021-10-07 – 2021-10-09 (×5): 10 mg via ORAL
  Filled 2021-10-07 (×6): qty 2

## 2021-10-07 MED ORDER — HEPARIN (PORCINE) 25000 UT/250ML-% IV SOLN
2150.0000 [IU]/h | INTRAVENOUS | Status: DC
  Administered 2021-10-07 (×2): 1750 [IU]/h via INTRAVENOUS
  Administered 2021-10-08: 2150 [IU]/h via INTRAVENOUS
  Filled 2021-10-07 (×2): qty 250

## 2021-10-07 NOTE — Progress Notes (Signed)
PROGRESS NOTE        PATIENT DETAILS Name: Leslie Mullen Age: 60 y.o. Sex: female Date of Birth: [birth year] Admit Date: 10/05/2021 Admitting Physician Norval Morton, MD [id number], Lu Duffel, PA-C  Brief Narrative: Patient is a 60 y.o. female with history of HTN, HLD, PAF, complete heart block-s/p PPM implantation November 2021-presented with epigastric pain for approximately 1 month-she was thought to have pancreatitis-and subsequently admitted to the hospitalist service.  Subjective: Abdominal pain better today.  Wants to try to resume oral intake.  Objective: Vitals: Blood pressure 131/81, pulse 85, temperature 99.2 F (37.3 C), temperature source Oral, resp. rate 14, height 5\' 8"  (1.727 m), weight (!) 152.7 kg, SpO2 93 %.   Exam: Gen Exam:Alert awake-not in any distress HEENT:atraumatic, normocephalic Chest: B/L clear to auscultation anteriorly CVS:S1S2 regular Abdomen: Soft-significantly less tenderness in the upper abdomen today. Extremities:no edema Neurology: Non focal Skin: no rash   Pertinent Labs/Radiology: WBC: 13.7 Hb: 9.2 Na: 137 K: 4.1 Creatinine: 1.43  10/19>> RUQ ultrasound: No cholelithiasis, 5.3 cm lesion adjacent to the pancreatic head. 10/20>> CT abdomen/pelvis: 4.1 cm lesion in pancreatic head/body. 10/30>> CT abdomen/pelvis: 2.5 x 3.8 cm lesion in the pancreatic head/body. 10/31>> MRCP abdomen: Pancreatic neoplasm with hepatic metastatic disease, local nodal disease.  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   Assessment/Plan: Acute pancreatitis: Likely due to malignancy-pancreatitis clinically improving-start clear liquids-continue IVF.  Continue as needed narcotics for pain.    Newly diagnosed pancreatic neoplasm with hepatic/local metastases: Have consulted IR for liver biopsy-but last dose of Eliquis was on 10/31 morning.  Will await recommendations from  IR.  Leukocytosis: Likely due to pancreatic inflammation-leukocytosis slowly improving-no other sources of infection apparent-follow.   AKI on CKD stage IIIb: AKI hemodynamically mediated-improving.  Follow.  PAF: Maintaining sinus rhythm-stopped Eliquis-as probably needs a liver biopsy for tissue diagnosis of probable underlying pancreatic malignancy.  On IV heparin.  History of complete heart block-s/p PPM placement: Continue telemetry monitoring  HTN: BP stable-continue amlodipine.  DM-2 (A1C 8.6): CBG stable-continue Lantus 50 units daily and SSI.  Follow and adjust  Recent Labs    10/07/21 0412 10/07/21 0735 10/07/21 1227  GLUCAP 117* 110* 195*     History of colon polyps/GERD: Claims recently had EGD/colonoscopy at [facility].    Morbid Obesity: Estimated body mass index is 51.19 kg/m as calculated from the following:   Height as of this encounter: 5\' 8"  (1.727 m).   Weight as of this encounter: 152.7 kg.    Procedures: None Consults: None DVT Prophylaxis: Eliquis Code Status:Full code Family Communication: Spouse was sleeping at bedside-we will update when able.  Time spent: 35 minutes-Greater than 50% of this time was spent in counseling, explanation of diagnosis, planning of further management, and coordination of care.  Diet: Diet Order             Diet full liquid Room service appropriate? Yes; Fluid consistency: Thin  Diet effective now                      Disposition Plan: Status is: Inpatient  Remains inpatient appropriate because: Acute pancreatitis-still with abdominal pain-n.p.o. status.  Work-up in progress-see above.   Antimicrobial agents: Anti-infectives (From admission, onward)    None        MEDICATIONS: Scheduled Meds:  amLODipine  10 mg Oral QHS   gabapentin  300 mg Oral TID   insulin aspart  0-9 Units Subcutaneous Q4H   pantoprazole  40 mg Oral Q1200   pravastatin  40 mg Oral q1800   sodium chloride flush  3 mL  Intravenous Q12H   Continuous Infusions:  heparin 1,900 Units/hr (10/07/21 1212)   lactated ringers 125 mL/hr at 10/07/21 1121   PRN Meds:.acetaminophen **OR** acetaminophen, fentaNYL (SUBLIMAZE) injection, loratadine, LORazepam, Muscle Rub, ondansetron **OR** ondansetron (ZOFRAN) IV, oxyCODONE, polyethylene glycol   I have personally reviewed following labs and imaging studies  LABORATORY DATA: CBC: Recent Labs  Lab 10/05/21 0715 10/06/21 0148 10/07/21 0219  WBC 16.5* 15.3* 13.7*  HGB 10.8* 9.3* 9.2*  HCT 34.8* 30.2* 29.9*  MCV 87.0 86.8 86.4  PLT 432* 314 304     Basic Metabolic Panel: Recent Labs  Lab 10/05/21 0715 10/06/21 0148 10/07/21 0219  NA 137 136 137  K 4.7 4.2 4.1  CL 101 102 104  CO2 27 25 27   GLUCOSE 262* 198* 100*  BUN 9 10 9   CREATININE 1.63* 1.40* 1.43*  CALCIUM 8.7* 8.3* 8.4*  MG  --  1.8  --      GFR: Estimated Creatinine Clearance: 66.5 mL/min (A) (by C-G formula based on SCr of 1.43 mg/dL (H)).  Liver Function Tests: Recent Labs  Lab 10/05/21 0715 10/07/21 0219  AST 11* 14*  ALT 9 10  ALKPHOS 78 58  BILITOT 0.4 0.4  PROT 7.5 6.4*  ALBUMIN 2.4* 2.0*    Recent Labs  Lab 10/05/21 0715 10/07/21 0219  LIPASE 297* 124*    No results for input(s): AMMONIA in the last 168 hours.  Coagulation Profile: Recent Labs  Lab 10/06/21 1637  INR 1.5*    Cardiac Enzymes: No results for input(s): CKTOTAL, CKMB, CKMBINDEX, TROPONINI in the last 168 hours.  BNP (last 3 results) No results for input(s): PROBNP in the last 8760 hours.  Lipid Profile: Recent Labs    10/05/21 0957  TRIG 116     Thyroid Function Tests: No results for input(s): TSH, T4TOTAL, FREET4, T3FREE, THYROIDAB in the last 72 hours.  Anemia Panel: No results for input(s): VITAMINB12, FOLATE, FERRITIN, TIBC, IRON, RETICCTPCT in the last 72 hours.  Urine analysis:    Component Value Date/Time   COLORURINE YELLOW 10/05/2021 0715   APPEARANCEUR HAZY (A)  10/05/2021 0715   LABSPEC 1.030 10/05/2021 0715   PHURINE 5.0 10/05/2021 0715   GLUCOSEU >=500 (A) 10/05/2021 0715   HGBUR SMALL (A) 10/05/2021 0715   BILIRUBINUR NEGATIVE 10/05/2021 0715   KETONESUR NEGATIVE 10/05/2021 0715   PROTEINUR >=300 (A) 10/05/2021 0715   NITRITE NEGATIVE 10/05/2021 0715   LEUKOCYTESUR NEGATIVE 10/05/2021 0715    Sepsis Labs: Lactic Acid, Venous No results found for: LATICACIDVEN  MICROBIOLOGY: Recent Results (from the past 240 hour(s))  Resp Panel by RT-PCR (Flu A&B, Covid) Nasopharyngeal Swab     Status: None   Collection Time: 10/05/21 10:00 AM   Specimen: Nasopharyngeal Swab; Nasopharyngeal(NP) swabs in vial transport medium  Result Value Ref Range Status   SARS Coronavirus 2 by RT PCR NEGATIVE NEGATIVE Final    Comment: (NOTE) SARS-CoV-2 target nucleic acids are NOT DETECTED.  The SARS-CoV-2 RNA is generally detectable in upper respiratory specimens during the acute phase of infection. The lowest concentration of SARS-CoV-2 viral copies this assay can detect is 138 copies/mL. A negative result does not preclude SARS-Cov-2 infection and should not be used as the sole basis for  treatment or other patient management decisions. A negative result may occur with  improper specimen collection/handling, submission of specimen other than nasopharyngeal swab, presence of viral mutation(s) within the areas targeted by this assay, and inadequate number of viral copies(<138 copies/mL). A negative result must be combined with clinical observations, patient history, and epidemiological information. The expected result is Negative.  Fact Sheet for Patients:  EntrepreneurPulse.com.au  Fact Sheet for Healthcare Providers:  IncredibleEmployment.be  This test is no t yet approved or cleared by the [place] FDA and  has been authorized for detection and/or diagnosis of SARS-CoV-2 by FDA under an Emergency Use  Authorization (EUA). This EUA will remain  in effect (meaning this test can be used) for the duration of the COVID-19 declaration under Section 564(b)(1) of the Act, 21 U.S.C.section 360bbb-3(b)(1), unless the authorization is terminated  or revoked sooner.       Influenza A by PCR NEGATIVE NEGATIVE Final   Influenza B by PCR NEGATIVE NEGATIVE Final    Comment: (NOTE) The Xpert Xpress SARS-CoV-2/FLU/RSV plus assay is intended as an aid in the diagnosis of influenza from Nasopharyngeal swab specimens and should not be used as a sole basis for treatment. Nasal washings and aspirates are unacceptable for Xpert Xpress SARS-CoV-2/FLU/RSV testing.  Fact Sheet for Patients: EntrepreneurPulse.com.au  Fact Sheet for Healthcare Providers: IncredibleEmployment.be  This test is not yet approved or cleared by the [place] FDA and has been authorized for detection and/or diagnosis of SARS-CoV-2 by FDA under an Emergency Use Authorization (EUA). This EUA will remain in effect (meaning this test can be used) for the duration of the COVID-19 declaration under Section 564(b)(1) of the Act, 21 U.S.C. section 360bbb-3(b)(1), unless the authorization is terminated or revoked.  Performed at [facility], [place] [address], [place], [place] [address]     RADIOLOGY STUDIES/RESULTS: CT ABDOMEN PELVIS WO CONTRAST  Result Date: 10/05/2021 CLINICAL DATA:  Abdominal pain, acute, nonlocalized. R sided rib pain for one week EXAM: CT ABDOMEN AND PELVIS WITHOUT CONTRAST TECHNIQUE: Multidetector CT imaging of the abdomen and pelvis was performed following the standard protocol without IV contrast. COMPARISON:  CT abdomen pelvis 09/25/2021, ultrasound abdomen 09/24/2021 FINDINGS: Lower chest: Cardiac leads partially noted. Hepatobiliary: No focal liver abnormality. No gallstones, gallbladder wall thickening, or pericholecystic fluid. No biliary dilatation. Pancreas:  Difficult to visualize persistent at least 2.5 x 3.8 cm hypodense lesion within the region of the pancreatic head/body. Normal pancreatic contour. No surrounding inflammatory changes. No main pancreatic ductal dilatation. Spleen: Normal in size without focal abnormality. Adrenals/Urinary Tract: No adrenal nodule bilaterally. No nephrolithiasis and no hydronephrosis. No definite contour-deforming renal mass. No ureterolithiasis or hydroureter. The urinary bladder is unremarkable. Stomach/Bowel: Stomach is within normal limits. No evidence of bowel wall thickening or dilatation. Scattered colonic diverticulosis. Appendix appears normal. Vascular/Lymphatic: No intraperitoneal free fluid. No intraperitoneal free gas. No organized fluid collection. Reproductive: A t shaped intrauterine device is noted within the uterus in grossly appropriate position. Uterus and bilateral adnexa are unremarkable. Other: No intraperitoneal free fluid. No intraperitoneal free gas. No organized fluid collection. Musculoskeletal: There is a 2 cm fat containing umbilical hernia. No suspicious lytic or blastic osseous lesions. No acute displaced fracture. Grade 1 anterolisthesis of L4 on L5. Multilevel degenerative changes of the spine. IMPRESSION: 1. Difficult to visualize on this noncontrast study persistent at least 2.5 x 3.8 cm hypodense lesion within the region of the pancreatic head/body. Recommend nonemergent MRI pancreatic protocol further evaluation. When the patient is  clinically stable and able to follow directions and hold their breath (preferably as an outpatient) further evaluation with dedicated abdominal MRI should be considered. 2. Scattered colonic diverticulosis with no acute diverticulitis. 3. T-shaped intrauterine device in grossly appropriate position. 4.  Aortic Atherosclerosis (ICD10-I70.0). Electronically Signed   By: Iven Finn M.D.   On: 10/05/2021 15:47   MR ABDOMEN W WO CONTRAST  Result Date:  10/06/2021 CLINICAL DATA:  A 60 year old female with hypertension and hyperlipidemia presents for evaluation of pancreatic lesion versus is pancreatitis. EXAM: MRI ABDOMEN WITHOUT AND WITH CONTRAST TECHNIQUE: Multiplanar multisequence MR imaging of the abdomen was performed both before and after the administration of intravenous contrast. CONTRAST:  18mL GADAVIST GADOBUTROL 1 MMOL/ML IV SOLN COMPARISON:  Comparison made with September 25, 2021. FINDINGS: Lower chest: No effusion, no consolidative process. Hepatobiliary: Lobular hepatic contours. Mild hepatomegaly, small LEFT lobe with 22 cm greatest craniocaudal dimension. No substantial steatosis. Focal hepatic lesion in the LEFT hepatic lobe (image 49/10) this does not meet criteria for a cyst and measures 1.5 cm. Similar lesion in the posterior RIGHT hepatic lobe measuring 1.5 cm (image 8/9) additional lesion in the medial segment of the LEFT hepatic lobe (image 24/9) 9 mm. Additional subtle foci of restricted diffusion in the lateral segment and the medial segment of the LEFT hepatic lobe, at least 2 additional tiny foci. These areas show subtle hypoenhancement and signs of restricted diffusion. No biliary duct dilation. No pericholecystic stranding. Pancreas: Mass in the pancreatic head measuring 4.9 x 4.9 cm. Central hypointensity compatible with central necrosis there is loss of the normal main pancreatic duct at this location, only mild ductal dilation is noted. Mass extending anteriorly. Mild infiltration of the soft tissues around vascular structures and distortion of vascular structures, suspect least abutment of the portal vein based on its appearance. Common hepatic artery passes immediately posterior to the mass. Celiac trunk is uninvolved as is SMA as best as can be determined on MRI. Subtle nodularity in the transverse mesocolon. Surrounding nodal enlargement in the celiac with hepatic lesions as described. Lesion does not have a cystic appearance on  T2 imaging. Spleen:  Unremarkable. Adrenals/Urinary Tract: Adrenal glands are normal. Symmetric renal enhancement without suspicious renal lesion. Stomach/Bowel: Duodenum and small bowel are inseparable from the mass at the head of the pancreas. Vascular/Lymphatic: Vascular structures and relationships as described above. Nodal disease about the celiac axis, lymph nodes measuring up to a cm adjacent to the pancreas lymph nodes best seen on diffusion-weighted imaging. Supra pancreatic lymph node on image 103/11 and hepatic gastric lymph nodes on image 99/11 also with mild enlargement of portacaval lymph nodes. Other:  No ascites. Musculoskeletal: No suspicious bone lesions identified. 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 Subsequent local staging could be performed with pancreatic protocol CT and EUS as indicated. Local invasion of stomach and duodenum are suspected. Mild stranding about the pancreas could be indicative of mild superimposed pancreatitis or local tumor extension particularly into the transverse mesocolon. These results will be called to the ordering clinician or representative by the Radiologist Assistant, and communication documented in the PACS or [employer]. Electronically Signed   By: Zetta Bills M.D.   On: 10/06/2021 15:40     LOS: 2 days   Oren Binet, MD  [facility]    To contact  the attending provider between 7A-7P or the covering provider during after hours 7P-7A, please log into the web site [email or web address] and access using universal [place] password for that web site. If you do not have the password, please call the hospital operator.  10/07/2021, 2:18 PM

## 2021-10-07 NOTE — Progress Notes (Signed)
[place] for Heparin Indication: atrial fibrillation  Allergies  Allergen Reactions   Penicillins Shortness Of Breath, Itching and Swelling    Has patient had a PCN reaction causing immediate rash, facial/tongue/throat swelling, SOB or lightheadedness with hypotension: Yes Has patient had a PCN reaction causing severe rash involving mucus membranes or skin necrosis:No Has patient had a PCN reaction that required hospitalization: No Has patient had a PCN reaction occurring within the last 10 years: no If all of the above answers are "NO", then may proceed with Cephalosporin use.      Patient Measurements: Height: 5\' 8"  (172.7 cm) Weight: (!) 152.7 kg (336 lb 10.3 oz) IBW/kg (Calculated) : 63.9  Heparin Dosing Weight: 101.7 kg  Vital Signs: Temp: 99.3 F (37.4 C) (11/01 2000) Temp Source: Oral (11/01 2000) BP: 94/69 (11/01 2000) Pulse Rate: 96 (11/01 2000)  Labs: Recent Labs    10/05/21 0715 10/06/21 0148 10/06/21 1637 10/06/21 1637 10/07/21 0219 10/07/21 0907 10/07/21 1816 10/07/21 1827  HGB 10.8* 9.3*  --   --  9.2*  --   --   --   HCT 34.8* 30.2*  --   --  29.9*  --   --   --   PLT 432* 314  --   --  304  --   --   --   APTT  --   --  27   < > 49* 56*  --  54*  LABPROT  --   --  18.0*  --   --   --   --   --   INR  --   --  1.5*  --   --   --   --   --   HEPARINUNFRC  --   --  >1.10*  --  >1.10*  --  >1.10*  --   CREATININE 1.63* 1.40*  --   --  1.43*  --   --   --    < > = values in this interval not displayed.     Estimated Creatinine Clearance: 66.5 mL/min (A) (by C-G formula based on SCr of 1.43 mg/dL (H)).   Medications:  Scheduled:   amLODipine  10 mg Oral QHS   gabapentin  300 mg Oral TID   insulin aspart  0-9 Units Subcutaneous Q4H   pantoprazole  40 mg Oral Q1200   pravastatin  40 mg Oral q1800   sodium chloride flush  3 mL Intravenous Q12H   Infusions:   heparin 1,900 Units/hr (10/07/21 1212)    lactated ringers 125 mL/hr at 10/07/21 1841    Assessment: 60 YO female with newly diagnosed pancreatic cancer now in need of a biopsy. Patient was on Eliquis due to A. Fib PTA. Heparin infusion started 12 hours after her last dose of Eliquis (10/31 @ 0900) without a bolus.  Heparin level remains false elevated due to recent Eliquis administration. Will continue adjusting heparin infusion based on aPTT for the time being. aPTT subtherapeutic at 54. Will increase and recheck levels. No overt s/s of bleeding noted per RN.    Goal of Therapy:  Heparin level 0.3-0.7 units/ml APTT level: 66-102 APTT and heparin level do not correlate. Will titrate using APTT levels Monitor platelets by anticoagulation protocol: Yes  Plan:  Increase heparin infusion at 2150 units/hr Check aPTT in 6 hours and daily while on heparin Continue to monitor H&H and platelets    Albertina Parr, PharmD., BCPS, BCCCP Clinical Pharmacist Please  refer to [facility] for unit-specific pharmacist

## 2021-10-07 NOTE — Progress Notes (Signed)
[place] for Heparin Indication: atrial fibrillation  Allergies  Allergen Reactions   Penicillins Shortness Of Breath, Itching and Swelling    Has patient had a PCN reaction causing immediate rash, facial/tongue/throat swelling, SOB or lightheadedness with hypotension: Yes Has patient had a PCN reaction causing severe rash involving mucus membranes or skin necrosis:No Has patient had a PCN reaction that required hospitalization: No Has patient had a PCN reaction occurring within the last 10 years: no If all of the above answers are "NO", then may proceed with Cephalosporin use.      Patient Measurements: Height: 5\' 8"  (172.7 cm) Weight: (!) 152.7 kg (336 lb 10.3 oz) IBW/kg (Calculated) : 63.9  Heparin Dosing Weight: 101.7 kg  Vital Signs: Temp: 99.2 F (37.3 C) (11/01 0733) Temp Source: Oral (11/01 0733) BP: 138/80 (11/01 0733) Pulse Rate: 88 (11/01 0733)  Labs: Recent Labs    10/05/21 0715 10/06/21 0148 10/06/21 1637 10/07/21 0219 10/07/21 0907  HGB 10.8* 9.3*  --  9.2*  --   HCT 34.8* 30.2*  --  29.9*  --   PLT 432* 314  --  304  --   APTT  --   --  27 49* 56*  LABPROT  --   --  18.0*  --   --   INR  --   --  1.5*  --   --   HEPARINUNFRC  --   --  >1.10* >1.10*  --   CREATININE 1.63* 1.40*  --  1.43*  --     Estimated Creatinine Clearance: 66.5 mL/min (A) (by C-G formula based on SCr of 1.43 mg/dL (H)).   Medications:  Scheduled:   amLODipine  10 mg Oral QHS   gabapentin  300 mg Oral TID   insulin aspart  0-9 Units Subcutaneous Q4H   pantoprazole  40 mg Oral Q1200   pravastatin  40 mg Oral q1800   sodium chloride flush  3 mL Intravenous Q12H   Infusions:   heparin 1,750 Units/hr (10/07/21 1124)   lactated ringers 125 mL/hr at 10/07/21 1121    Assessment: 60 YO female with newly diagnosed pancreatic cancer now in need of a biopsy. Patient was on Eliquis due to A. Fib PTA. Heparin infusion started 12 hours after  her last dose of Eliquis (10/31 @ 0900) without a bolus.  Heparin level false elevated due to recent Eliquis administration. Will continue adjusting heparin infusion based on aPTT for the time being. aPTT subtherapeutic at 56. Will increase and recheck levels.   Goal of Therapy:  Heparin level 0.3-0.7 units/ml APTT level: 66-102 APTT and heparin level do not correlate. Will titrate using APTT levels Monitor platelets by anticoagulation protocol: Yes  Plan:  Increase heparin infusion at 1900 units/hr Check heparin level in 6 hours and daily while on heparin Continue to monitor H&H and platelets    Thank you for allowing pharmacy to be a part of this patient's care.  Ardyth Harps, PharmD Clinical Pharmacist

## 2021-10-07 NOTE — Progress Notes (Signed)
ANTICOAGULATION CONSULT NOTE - Follow Up Consult  Pharmacy Consult for heparin Indication: atrial fibrillation  Labs: Recent Labs    10/05/21 0715 10/06/21 0148 10/06/21 1637 10/07/21 0219  HGB 10.8* 9.3*  --  9.2*  HCT 34.8* 30.2*  --  29.9*  PLT 432* 314  --  304  APTT  --   --  27 49*  LABPROT  --   --  18.0*  --   INR  --   --  1.5*  --   HEPARINUNFRC  --   --  >1.10*  --   CREATININE 1.63* 1.40*  --   --     Assessment: 60yo female subtherapeutic on heparin with initial dosing while Eliquis on hold; no infusion issues or signs of bleeding per RN.  Goal of Therapy:  aPTT 66-102 seconds   Plan:  Will increase heparin infusion by 1-2 units/kg/hr to 1750 units/hr and check PTT in 6 hours.    Wynona Neat, PharmD, BCPS  10/07/2021,3:21 AM

## 2021-10-08 ENCOUNTER — Other Ambulatory Visit: Payer: Self-pay

## 2021-10-08 ENCOUNTER — Inpatient Hospital Stay (HOSPITAL_COMMUNITY): Payer: 59

## 2021-10-08 DIAGNOSIS — I480 Paroxysmal atrial fibrillation: Secondary | ICD-10-CM | POA: Diagnosis not present

## 2021-10-08 DIAGNOSIS — K859 Acute pancreatitis without necrosis or infection, unspecified: Secondary | ICD-10-CM | POA: Diagnosis not present

## 2021-10-08 DIAGNOSIS — I459 Conduction disorder, unspecified: Secondary | ICD-10-CM | POA: Diagnosis not present

## 2021-10-08 DIAGNOSIS — N179 Acute kidney failure, unspecified: Secondary | ICD-10-CM | POA: Diagnosis not present

## 2021-10-08 LAB — CBC
HCT: 25.3 % — ABNORMAL LOW (ref 36.0–46.0)
Hemoglobin: 7.7 g/dL — ABNORMAL LOW (ref 12.0–15.0)
MCH: 26.4 pg (ref 26.0–34.0)
MCHC: 30.4 g/dL (ref 30.0–36.0)
MCV: 86.6 fL (ref 80.0–100.0)
Platelets: 273 10*3/uL (ref 150–400)
RBC: 2.92 MIL/uL — ABNORMAL LOW (ref 3.87–5.11)
RDW: 12.6 % (ref 11.5–15.5)
WBC: 11.3 10*3/uL — ABNORMAL HIGH (ref 4.0–10.5)
nRBC: 0 % (ref 0.0–0.2)

## 2021-10-08 LAB — COMPREHENSIVE METABOLIC PANEL
ALT: 9 U/L (ref 0–44)
AST: 12 U/L — ABNORMAL LOW (ref 15–41)
Albumin: 1.7 g/dL — ABNORMAL LOW (ref 3.5–5.0)
Alkaline Phosphatase: 55 U/L (ref 38–126)
Anion gap: 5 (ref 5–15)
BUN: 9 mg/dL (ref 6–20)
CO2: 28 mmol/L (ref 22–32)
Calcium: 7.9 mg/dL — ABNORMAL LOW (ref 8.9–10.3)
Chloride: 102 mmol/L (ref 98–111)
Creatinine, Ser: 1.45 mg/dL — ABNORMAL HIGH (ref 0.44–1.00)
GFR, Estimated: 42 mL/min — ABNORMAL LOW (ref >60)
Glucose, Bld: 200 mg/dL — ABNORMAL HIGH (ref 70–99)
Potassium: 4.2 mmol/L (ref 3.5–5.1)
Sodium: 135 mmol/L (ref 135–145)
Total Bilirubin: <0.1 mg/dL — ABNORMAL LOW (ref 0.3–1.2)
Total Protein: 5.7 g/dL — ABNORMAL LOW (ref 6.5–8.1)

## 2021-10-08 LAB — GLUCOSE, CAPILLARY
Glucose-Capillary: 166 mg/dL — ABNORMAL HIGH (ref 70–99)
Glucose-Capillary: 167 mg/dL — ABNORMAL HIGH (ref 70–99)
Glucose-Capillary: 170 mg/dL — ABNORMAL HIGH (ref 70–99)
Glucose-Capillary: 206 mg/dL — ABNORMAL HIGH (ref 70–99)
Glucose-Capillary: 238 mg/dL — ABNORMAL HIGH (ref 70–99)
Glucose-Capillary: 298 mg/dL — ABNORMAL HIGH (ref 70–99)

## 2021-10-08 LAB — APTT
aPTT: 88 seconds — ABNORMAL HIGH (ref 24–36)
aPTT: 50 seconds — ABNORMAL HIGH (ref 24–36)

## 2021-10-08 MED ORDER — GELATIN ABSORBABLE 12-7 MM EX MISC
CUTANEOUS | Status: AC
  Filled 2021-10-08: qty 1

## 2021-10-08 MED ORDER — HEPARIN (PORCINE) 25000 UT/250ML-% IV SOLN
2250.0000 [IU]/h | INTRAVENOUS | Status: AC
Start: 2021-10-08 — End: 2021-10-09
  Administered 2021-10-08: 2150 [IU]/h via INTRAVENOUS
  Administered 2021-10-09: 2250 [IU]/h via INTRAVENOUS
  Filled 2021-10-08 (×2): qty 250

## 2021-10-08 MED ORDER — INSULIN ASPART 100 UNIT/ML IJ SOLN
0.0000 [IU] | Freq: Three times a day (TID) | INTRAMUSCULAR | Status: DC
  Administered 2021-10-09 (×2): 5 [IU] via SUBCUTANEOUS

## 2021-10-08 MED ORDER — FENTANYL CITRATE (PF) 100 MCG/2ML IJ SOLN
INTRAMUSCULAR | Status: AC | PRN
  Administered 2021-10-08: 50 ug via INTRAVENOUS

## 2021-10-08 MED ORDER — LIDOCAINE-EPINEPHRINE 1 %-1:100000 IJ SOLN
INTRAMUSCULAR | Status: AC
  Filled 2021-10-08: qty 1

## 2021-10-08 MED ORDER — INSULIN ASPART 100 UNIT/ML IJ SOLN
0.0000 [IU] | Freq: Every day | INTRAMUSCULAR | Status: DC
  Administered 2021-10-08: 3 [IU] via SUBCUTANEOUS

## 2021-10-08 MED ORDER — MIDAZOLAM HCL 2 MG/2ML IJ SOLN
INTRAMUSCULAR | Status: AC
  Filled 2021-10-08: qty 2

## 2021-10-08 MED ORDER — FENTANYL CITRATE (PF) 100 MCG/2ML IJ SOLN
INTRAMUSCULAR | Status: AC
  Filled 2021-10-08: qty 2

## 2021-10-08 MED ORDER — MIDAZOLAM HCL 2 MG/2ML IJ SOLN
INTRAMUSCULAR | Status: AC | PRN
  Administered 2021-10-08: 1 mg via INTRAVENOUS

## 2021-10-08 NOTE — Consult Note (Signed)
Chief Complaint: Hepatic lesion. Request is hepatic lesion biopsy  Referring Physician(s): Dr. Nena Alexander  Supervising Physician: Sandi Mariscal  Patient Status: [facility] - In-pt  History of Present Illness: Leslie Mullen is a 60 y.o. female History HTN, HLD, PAF, complete heart block. Presented to the ED at [facility] with epigastric pain X 1 month. Thought to be pancreatis. Found to have a hepatic lesion. MR from 10.31.22 reads Lobular hepatic contours. Mild hepatomegaly, small LEFT lobe with 22 cm greatest craniocaudal dimension. No substantial steatosis. Focal hepatic lesion in the LEFT hepatic lobe (image 49/10) this does not meet criteria for a cyst and measures 1.5 cm. Similar lesion in the posterior RIGHT hepatic lobe measuring 1.5 cm (image 8/9) additional lesion in the medial segment of the LEFT hepatic lobe (image 24/9) 9 mm. Team is requesting hepatic lesion biopsy for further evaluation.   Currently without any significant complaints. Patient alert and laying in bed, calm and comfortable. Endorses abdominal pain but states it has improved since admissio. Denies any fevers, headache, chest pain, SOB, cough, nausea, vomiting or bleeding. Return precautions and treatment recommendations and follow-up discussed with the patient who is agreeable with the plan.   Past Medical History:  Diagnosis Date   Abnormal uterine bleeding    Allergy    Anemia    Asthma    Cataract    BOTH EYES   Complete heart block ([place])    a. Med[employer] PPM 10/2020.   Diabetes ([place])    type II    Diabetic neuropathy ([place])    Endometrial cancer ([place])    GERD (gastroesophageal reflux disease)    Hyperlipidemia    Hypertension    Morbid obesity ([place])    Osteoarthritis    right knee    Pancreatitis    Pneumonia     Past Surgical History:  Procedure Laterality Date   CERVICAL BIOPSY     CESAREAN SECTION     x3   COLONOSCOPY WITH PROPOFOL N/A 06/29/2017   Procedure: COLONOSCOPY WITH PROPOFOL;  Surgeon:  Irene Shipper, MD;  Location: [facility] ENDOSCOPY;  Service: Endoscopy;  Laterality: N/A;   CYSTECTOMY Right 2010   Cyst removed from right groin.    DILATION AND CURETTAGE OF UTERUS N/A 07/29/2021   Procedure: DILATATION AND CURETTAGE OF UTERUS;  Surgeon: Everitt Amber, MD;  Location: [facility] ORS;  Service: Gynecology;  Laterality: N/A;   INTRAUTERINE DEVICE (IUD) INSERTION N/A 07/29/2021   Procedure: INTRAUTERINE DEVICE (IUD) INSERTION;  Surgeon: Everitt Amber, MD;  Location: [facility] ORS;  Service: Gynecology;  Laterality: N/A;  IUD FROM MAIN PHARMACY   PACEMAKER IMPLANT N/A 10/11/2020   Procedure: PACEMAKER IMPLANT;  Surgeon: Vickie Epley, MD;  Location: [place] [facility];  Service: Cardiovascular;  Laterality: N/A;   TONSILLECTOMY      Allergies: Penicillins  Medications: Prior to Admission medications   Medication Sig Start Date End Date Taking? Authorizing Provider  amLODipine (NORVASC) 10 MG tablet Take 1 tablet (10 mg total) by mouth at bedtime. 01/03/21  Yes [name], MD  apixaban (ELIQUIS) 5 MG TABS tablet Take 1 tablet (5 mg total) by mouth 2 (two) times daily. 09/18/21  Yes Fenton, Clint R, PA  gabapentin (NEURONTIN) 300 MG capsule Take 1 capsule (300 mg total) by mouth 2 (two) times daily. Patient taking differently: Take 300 mg by mouth 3 (three) times daily. 10/12/20  Yes Evans Lance, MD  insulin degludec (TRESIBA FLEXTOUCH) 200 UNIT/ML FlexTouch Pen Inject 80 Units into  the skin every evening. 10/12/20  Yes Evans Lance, MD  lisinopril (ZESTRIL) 20 MG tablet TAKE 1 TABLET(20 MG) BY MOUTH DAILY 06/13/21  Yes Vickie Epley, MD  loratadine (CLARITIN) 10 MG tablet Take 10 mg by mouth daily as needed for allergies.   Yes [provider]  omeprazole (PRILOSEC) 40 MG capsule Take 40 mg by mouth 2 (two) times daily. 09/05/21  Yes [provider]  ondansetron (ZOFRAN ODT) 4 MG disintegrating tablet Take 1 tablet (4 mg total) by mouth every 8 (eight) hours as needed  for nausea or vomiting. 09/25/21  Yes Charlann Lange, PA-C  oxyCODONE-acetaminophen (PERCOCET/ROXICET) 5-325 MG tablet Take 1 tablet by mouth every 6 (six) hours as needed for severe pain. 09/25/21  Yes Upstill, Nehemiah Settle, PA-C  polyethylene glycol (MIRALAX / GLYCOLAX) 17 g packet Take 17 g by mouth daily as needed for moderate constipation.   Yes [provider]  pravastatin (PRAVACHOL) 40 MG tablet TAKE 1 TABLET(40 MG) BY MOUTH DAILY 07/23/20  Yes [name], MD  SYNJARDY XR 12.04-999 MG TB24 Take 1 tablet by mouth 2 (two) times daily. 05/30/21  Yes [provider]  traMADol (ULTRAM) 50 MG tablet TAKE 1 TABLET(50 MG) BY MOUTH TWICE DAILY AS NEEDED Patient taking differently: Take 50 mg by mouth 2 (two) times daily. 09/11/20  Yes [name], MD  trolamine salicylate (ASPERCREME) 10 % cream Apply 1 application topically 2 (two) times daily as needed for muscle pain.   Yes [provider]  glucose blood test strip  07/24/15   [provider]  Insulin Pen Needle 31G X 5 MM MISC  08/01/15   [provider]  ONETOUCH DELICA LANCETS 93X MISC  08/01/15   [provider]     Family History  Adopted: Yes  Problem Relation Age of Onset   Diabetes Father    Heart disease Father    Cancer Mother        colon   Colon cancer Neg Hx    Esophageal cancer Neg Hx    Rectal cancer Neg Hx    Stomach cancer Neg Hx     Social History   Socioeconomic History   Marital status: Married    Spouse name: Not on file   Number of children: 2   Years of education: Not on file   Highest education level: Not on file  Occupational History   Occupation: Intake Coordinator  Tobacco Use   Smoking status: Former    Types: Cigarettes   Smokeless tobacco: Never   Tobacco comments:    Former smoker (09/18/2021)  Vaping Use   Vaping Use: Never used  Substance and Sexual Activity   Alcohol use: No    Alcohol/week: 0.0 standard drinks   Drug use: No    Sexual activity: Not on file  Other Topics Concern   Not on file  Social History Narrative   Not on file   Social Determinants of Health   Financial Resource Strain: Not on file  Food Insecurity: Not on file  Transportation Needs: Not on file  Physical Activity: Not on file  Stress: Not on file  Social Connections: Not on file    Review of Systems: A 12 point ROS discussed and pertinent positives are indicated in the HPI above.  All other systems are negative.  Review of Systems  Constitutional:  Negative for fatigue and fever.  HENT:  Negative for congestion.   Respiratory:  Negative for cough and  shortness of breath.   Gastrointestinal:  Positive for abdominal pain (improving since admission.). Negative for diarrhea, nausea and vomiting.   Vital Signs: BP (!) 142/73 (BP Location: Right Arm)   Pulse 83   Temp 98.7 F (37.1 C) (Oral)   Resp 15   Ht 5\' 8"  (1.727 m)   Wt (!) 336 lb 10.3 oz (152.7 kg)   SpO2 100%   BMI 51.19 kg/m   Physical Exam Vitals and nursing note reviewed.  Constitutional:      Appearance: She is well-developed.  HENT:     Head: Normocephalic and atraumatic.  Eyes:     Conjunctiva/sclera: Conjunctivae normal.  Cardiovascular:     Rate and Rhythm: Normal rate and regular rhythm.     Heart sounds: Normal heart sounds.  Pulmonary:     Effort: Pulmonary effort is normal.     Breath sounds: Normal breath sounds.  Musculoskeletal:     Cervical back: Normal range of motion.  Neurological:     Mental Status: She is alert and oriented to person, place, and time.    Imaging: CT ABDOMEN PELVIS WO CONTRAST  Result Date: 10/05/2021 CLINICAL DATA:  Abdominal pain, acute, nonlocalized. R sided rib pain for one week EXAM: CT ABDOMEN AND PELVIS WITHOUT CONTRAST TECHNIQUE: Multidetector CT imaging of the abdomen and pelvis was performed following the standard protocol without IV contrast. COMPARISON:  CT abdomen pelvis 09/25/2021, ultrasound abdomen  09/24/2021 FINDINGS: Lower chest: Cardiac leads partially noted. Hepatobiliary: No focal liver abnormality. No gallstones, gallbladder wall thickening, or pericholecystic fluid. No biliary dilatation. Pancreas: Difficult to visualize persistent at least 2.5 x 3.8 cm hypodense lesion within the region of the pancreatic head/body. Normal pancreatic contour. No surrounding inflammatory changes. No main pancreatic ductal dilatation. Spleen: Normal in size without focal abnormality. Adrenals/Urinary Tract: No adrenal nodule bilaterally. No nephrolithiasis and no hydronephrosis. No definite contour-deforming renal mass. No ureterolithiasis or hydroureter. The urinary bladder is unremarkable. Stomach/Bowel: Stomach is within normal limits. No evidence of bowel wall thickening or dilatation. Scattered colonic diverticulosis. Appendix appears normal. Vascular/Lymphatic: No intraperitoneal free fluid. No intraperitoneal free gas. No organized fluid collection. Reproductive: A t shaped intrauterine device is noted within the uterus in grossly appropriate position. Uterus and bilateral adnexa are unremarkable. Other: No intraperitoneal free fluid. No intraperitoneal free gas. No organized fluid collection. Musculoskeletal: There is a 2 cm fat containing umbilical hernia. No suspicious lytic or blastic osseous lesions. No acute displaced fracture. Grade 1 anterolisthesis of L4 on L5. Multilevel degenerative changes of the spine. IMPRESSION: 1. Difficult to visualize on this noncontrast study persistent at least 2.5 x 3.8 cm hypodense lesion within the region of the pancreatic head/body. Recommend nonemergent MRI pancreatic protocol further evaluation. When the patient is clinically stable and able to follow directions and hold their breath (preferably as an outpatient) further evaluation with dedicated abdominal MRI should be considered. 2. Scattered colonic diverticulosis with no acute diverticulitis. 3. T-shaped intrauterine  device in grossly appropriate position. 4.  Aortic Atherosclerosis (ICD10-I70.0). Electronically Signed   By: Iven Finn M.D.   On: 10/05/2021 15:47   MR ABDOMEN W WO CONTRAST  Result Date: 10/06/2021 CLINICAL DATA:  A 60 year old female with hypertension and hyperlipidemia presents for evaluation of pancreatic lesion versus is pancreatitis. EXAM: MRI ABDOMEN WITHOUT AND WITH CONTRAST TECHNIQUE: Multiplanar multisequence MR imaging of the abdomen was performed both before and after the administration of intravenous contrast. CONTRAST:  23mL GADAVIST GADOBUTROL 1 MMOL/ML IV SOLN COMPARISON:  Comparison made  with September 25, 2021. FINDINGS: Lower chest: No effusion, no consolidative process. Hepatobiliary: Lobular hepatic contours. Mild hepatomegaly, small LEFT lobe with 22 cm greatest craniocaudal dimension. No substantial steatosis. Focal hepatic lesion in the LEFT hepatic lobe (image 49/10) this does not meet criteria for a cyst and measures 1.5 cm. Similar lesion in the posterior RIGHT hepatic lobe measuring 1.5 cm (image 8/9) additional lesion in the medial segment of the LEFT hepatic lobe (image 24/9) 9 mm. Additional subtle foci of restricted diffusion in the lateral segment and the medial segment of the LEFT hepatic lobe, at least 2 additional tiny foci. These areas show subtle hypoenhancement and signs of restricted diffusion. No biliary duct dilation. No pericholecystic stranding. Pancreas: Mass in the pancreatic head measuring 4.9 x 4.9 cm. Central hypointensity compatible with central necrosis there is loss of the normal main pancreatic duct at this location, only mild ductal dilation is noted. Mass extending anteriorly. Mild infiltration of the soft tissues around vascular structures and distortion of vascular structures, suspect least abutment of the portal vein based on its appearance. Common hepatic artery passes immediately posterior to the mass. Celiac trunk is uninvolved as is SMA as best  as can be determined on MRI. Subtle nodularity in the transverse mesocolon. Surrounding nodal enlargement in the celiac with hepatic lesions as described. Lesion does not have a cystic appearance on T2 imaging. Spleen:  Unremarkable. Adrenals/Urinary Tract: Adrenal glands are normal. Symmetric renal enhancement without suspicious renal lesion. Stomach/Bowel: Duodenum and small bowel are inseparable from the mass at the head of the pancreas. Vascular/Lymphatic: Vascular structures and relationships as described above. Nodal disease about the celiac axis, lymph nodes measuring up to a cm adjacent to the pancreas lymph nodes best seen on diffusion-weighted imaging. Supra pancreatic lymph node on image 103/11 and hepatic gastric lymph nodes on image 99/11 also with mild enlargement of portacaval lymph nodes. Other:  No ascites. Musculoskeletal: No suspicious bone lesions identified. 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 Subsequent local staging could be performed with pancreatic protocol CT and EUS as indicated. Local invasion of stomach and duodenum are suspected. Mild stranding about the pancreas could be indicative of mild superimposed pancreatitis or local tumor extension particularly into the transverse mesocolon. These results will be called to the ordering clinician or representative by the Radiologist Assistant, and communication documented in the PACS or [employer]. Electronically Signed   By: Zetta Bills M.D.   On: 10/06/2021 15:40   CT ABDOMEN PELVIS W CONTRAST  Result Date: 09/25/2021 CLINICAL DATA:  Elevated lipase, acute pancreatitis, abnormal ultrasound EXAM: CT ABDOMEN AND PELVIS WITH CONTRAST TECHNIQUE: Multidetector CT imaging of the abdomen and pelvis was performed using the standard  protocol following bolus administration of intravenous contrast. CONTRAST:  65mL OMNIPAQUE IOHEXOL 350 MG/ML SOLN COMPARISON:  Right upper quadrant ultrasound dated 09/24/2021 FINDINGS: Lower chest: Lung bases are clear. Hepatobiliary: Liver is within normal limits. Gallbladder is unremarkable. No intrahepatic or extrahepatic ductal dilatation. Pancreas: 4.1 x 3.7 cm low-density lesion in the pancreatic head/body (series 3/image 31), possibly reflecting focal pancreatitis given the clinical history. No pancreatic ductal dilatation in the pancreatic tail. Spleen: Within normal limits. Adrenals/Urinary Tract: Adrenal glands are within normal limits. Kidneys are within normal limits.  No hydronephrosis. Bladder is within normal limits. Stomach/Bowel: Stomach is within normal limits. No  evidence of bowel obstruction. Appendix is not discretely visualized. Sigmoid diverticulosis, without evidence of diverticulitis. Vascular/Lymphatic: No evidence of abdominal aortic aneurysm. Small upper abdominal lymph nodes, including a 13 mm short axis node in the porta hepatis (series 3/image 25), likely reactive. Reproductive: Uterus is notable for an IUD in satisfactory position. No adnexal masses. Other: No abdominopelvic ascites. Small fat containing periumbilical hernia (series 3/image 62). Musculoskeletal: Degenerative changes of the visualized thoracolumbar spine. IMPRESSION: 4.1 cm low-density lesion in the pancreatic head/body, possibly reflecting focal pancreatitis given the clinical history. However, this warrants short-term follow-up in 4-6 weeks to document improvement/resolution. Alternatively, EUS could be considered for tissue sampling as clinically warranted. Electronically Signed   By: Julian Hy M.D.   On: 09/25/2021 01:03   US Abdomen Limited RUQ (LIVER/GB)  Result Date: 09/24/2021 CLINICAL DATA:  Right upper quadrant abdominal pain EXAM: ULTRASOUND ABDOMEN LIMITED RIGHT UPPER QUADRANT COMPARISON:   None. FINDINGS: Gallbladder: No gallstones or wall thickening visualized. No sonographic Murphy sign noted by sonographer. Common bile duct: Diameter: 4 mm Liver: No focal lesion identified. Within normal limits in parenchymal echogenicity. Portal vein is patent on color Doppler imaging with normal direction of blood flow towards the liver. Other: 4.9 x 4.9 x 5.3 cm lesion adjacent to the pancreatic head. While this may reflect a loop of bowel, a mass is not excluded. IMPRESSION: 5.3 cm lesion adjacent to the pancreatic head. While this may reflect a loop of bowel, a mass is not excluded. Consider CT abdomen with contrast for further evaluation. Electronically Signed   By: Julian Hy M.D.   On: 09/24/2021 23:27    Labs:  CBC: Recent Labs    10/05/21 0715 10/06/21 0148 10/07/21 0219 10/08/21 0254  WBC 16.5* 15.3* 13.7* 11.3*  HGB 10.8* 9.3* 9.2* 7.7*  HCT 34.8* 30.2* 29.9* 25.3*  PLT 432* 314 304 273    COAGS: Recent Labs    10/11/20 0225 06/15/21 0050 10/06/21 1637 10/07/21 0219 10/07/21 0907 10/07/21 1827 10/08/21 0254 10/08/21 1025  INR 1.1 1.0 1.5*  --   --   --   --   --   APTT  --   --  27   < > 56* 54* 88* 50*   < > = values in this interval not displayed.    BMP: Recent Labs    10/05/21 0715 10/06/21 0148 10/07/21 0219 10/08/21 0254  NA 137 136 137 135  K 4.7 4.2 4.1 4.2  CL 101 102 104 102  CO2 27 25 27 28   GLUCOSE 262* 198* 100* 200*  BUN 9 10 9 9   CALCIUM 8.7* 8.3* 8.4* 7.9*  CREATININE 1.63* 1.40* 1.43* 1.45*  GFRNONAA 36* 43* 42* 42*    LIVER FUNCTION TESTS: Recent Labs    09/24/21 2059 10/05/21 0715 10/07/21 0219 10/08/21 0254  BILITOT 0.5 0.4 0.4 <0.1*  AST 14* 11* 14* 12*  ALT 8 9 10 9   ALKPHOS 74 78 58 55  PROT 7.6 7.5 6.4* 5.7*  ALBUMIN 2.4* 2.4* 2.0* 1.7*     Assessment and Plan:  60 y.o female inpatient. History HTN, HLD, PAF, complete heart block. Presented to the ED at [facility] with epigastric pain X 1 month. Found to have a  hepatic lesion. MR from 10.31.22 reads Lobular hepatic contours. Mild hepatomegaly, small LEFT lobe with 22 cm greatest craniocaudal dimension. No substantial steatosis. Focal hepatic lesion in the LEFT hepatic lobe (image 49/10) this does not meet criteria for a cyst and measures 1.5  cm. Similar lesion in the posterior RIGHT hepatic lobe measuring 1.5 cm (image 8/9) additional lesion in the medial segment of the LEFT hepatic lobe (image 24/9) 9 mm. Team is requesting hepatic lesion biopsy for further evaluation.   Cr 1.45., anion gap 7.9. WBC is 11.3, Total protein 5.7, AST 12. All other labs and medications are within acceptable parameters.Patient has been NPO since midnight.  Risks and benefits of hepatic lesion biopsy  was discussed with the patient and/or patient's family including, but not limited to bleeding, infection, damage to adjacent structures or low yield requiring additional tests.  All of the questions were answered and there is agreement to proceed.  Consent signed and in chart.   Thank you for this interesting consult.  I greatly enjoyed meeting Leslie Mullen and look forward to participating in their care.  A copy of this report was sent to the requesting provider on this date.  Electronically Signed: Jacqualine Mau, NP 10/08/2021, 11:59 AM   I spent a total of 40 Minutes    in face to face in clinical consultation, greater than 50% of which was counseling/coordinating care for liver lesion biopsy

## 2021-10-08 NOTE — Progress Notes (Signed)
1800: Restarted heparin gtt per pharmacy at 21.25ml/hr 2,150units/hr.

## 2021-10-08 NOTE — Progress Notes (Signed)
[place] for Heparin Indication: atrial fibrillation  Allergies  Allergen Reactions   Penicillins Shortness Of Breath, Itching and Swelling    Has patient had a PCN reaction causing immediate rash, facial/tongue/throat swelling, SOB or lightheadedness with hypotension: Yes Has patient had a PCN reaction causing severe rash involving mucus membranes or skin necrosis:No Has patient had a PCN reaction that required hospitalization: No Has patient had a PCN reaction occurring within the last 10 years: no If all of the above answers are "NO", then may proceed with Cephalosporin use.      Patient Measurements: Height: 5\' 8"  (172.7 cm) Weight: (!) 152.7 kg (336 lb 10.3 oz) IBW/kg (Calculated) : 63.9  Heparin Dosing Weight: 101.7 kg  Vital Signs: Temp: 98.9 F (37.2 C) (11/02 1227) Temp Source: Oral (11/02 1227) BP: 138/71 (11/02 1227) Pulse Rate: 84 (11/02 1227)  Labs: Recent Labs    10/06/21 0148 10/06/21 0148 10/06/21 1637 10/07/21 0219 10/07/21 0907 10/07/21 1816 10/07/21 1827 10/08/21 0254 10/08/21 1025  HGB 9.3*  --   --  9.2*  --   --   --  7.7*  --   HCT 30.2*  --   --  29.9*  --   --   --  25.3*  --   PLT 314  --   --  304  --   --   --  273  --   APTT  --    < > 27 49*   < >  --  54* 88* 50*  LABPROT  --   --  18.0*  --   --   --   --   --   --   INR  --   --  1.5*  --   --   --   --   --   --   HEPARINUNFRC  --   --  >1.10* >1.10*  --  >1.10*  --   --   --   CREATININE 1.40*  --   --  1.43*  --   --   --  1.45*  --    < > = values in this interval not displayed.    Estimated Creatinine Clearance: 65.6 mL/min (A) (by C-G formula based on SCr of 1.45 mg/dL (H)).   Medications:  Scheduled:   amLODipine  10 mg Oral QHS   gabapentin  300 mg Oral TID   gelatin adsorbable       insulin aspart  0-9 Units Subcutaneous Q4H   lidocaine-EPINEPHrine       pantoprazole  40 mg Oral Q1200   pravastatin  40 mg Oral q1800    sodium chloride flush  3 mL Intravenous Q12H   Infusions:   heparin     lactated ringers 125 mL/hr at 10/08/21 0957    Assessment: 60 YO female with newly diagnosed pancreatic cancer now in need of a biopsy. Patient was on Eliquis due to A. Fib PTA. Heparin infusion started 12 hours after her last dose of Eliquis (10/31 @ 0900) without a bolus.   Heparin level remains false elevated due to recent Eliquis administration. Will continue adjusting heparin infusion based on aPTT for the time being. aPTT 50 at 10:25. Per RN, heparin infusion was stopped at 09:50 for IR procedure.   Will restart heparin infusion 2150 units/hour at 1800 and check levels 6 hours later. CBC trending down, hgb decreased from 9.2 > 7.7 overnight. Plts wnl. Per RN, no issues  with heparin infusion and no s/sx bleeding.   Goal of Therapy:  Heparin level 0.3-0.7 units/ml APTT level: 66-102 APTT and heparin level do not correlate. Will titrate using APTT levels Monitor platelets by anticoagulation protocol: Yes  Plan:  Restart heparin infusion at 2150 units/hr at 1800 Check aPTT/heparin level in 6 hours and daily while on heparin Continue to monitor H&H and platelets    Thank you for allowing pharmacy to be a part of this patient's care.  Ardyth Harps, PharmD Clinical Pharmacist

## 2021-10-08 NOTE — Progress Notes (Signed)
Inpatient Diabetes Program Recommendations  AACE/ADA: New Consensus Statement on Inpatient Glycemic Control   Target Ranges:  Prepandial:   less than 140 mg/dL      Peak postprandial:   less than 180 mg/dL (1-2 hours)      Critically ill patients:  140 - 180 mg/dL   Results for Leslie Mullen, Leslie Mullen (MRN [id number]) as of 10/08/2021 10:52  Ref. Range 10/07/2021 07:35 10/07/2021 12:27 10/07/2021 15:53 10/07/2021 19:59 10/08/2021 00:40 10/08/2021 04:18 10/08/2021 07:58  Glucose-Capillary Latest Ref Range: 70 - 99 mg/dL 110 (H) 195 (H) 253 (H) 263 (H) 206 (H) 170 (H) 166 (H)   Review of Glycemic Control  Diabetes history: DM2 Outpatient Diabetes medications: Tresiba 80 units QPM, Synjardy XR 12.04-999 mg BID Current orders for Inpatient glycemic control: Novolog 0-9 units Q4H  Inpatient Diabetes Program Recommendations:    Insulin: Please consider ordering Semglee 15 units Q24H.  Thanks, Barnie Alderman, RN, MSN, CDE Diabetes Coordinator Inpatient Diabetes Program [phone] (Team Pager from 8am to 5pm)

## 2021-10-08 NOTE — Progress Notes (Signed)
PROGRESS NOTE        PATIENT DETAILS Name: Leslie Mullen Age: 60 y.o. Sex: female Date of Birth: Oct 27, 1961 Admit Date: 10/05/2021 Admitting Physician Norval Morton, MD ZOX:[id number], Lu Duffel, PA-C  Brief Narrative: Patient is a 60 y.o. female with history of HTN, HLD, PAF, complete heart block-s/p PPM implantation November 2021-presented with epigastric pain for approximately 1 month-she was thought to have pancreatitis-and subsequently admitted to the hospitalist service.  Subjective: Abdominal pain has improved-for liver biopsy today.  Objective: Vitals: Blood pressure 138/71, pulse 84, temperature 98.9 F (37.2 C), temperature source Oral, resp. rate 16, height 5\' 8"  (1.727 m), weight (!) 152.7 kg, SpO2 100 %.   Exam: Gen Exam:Alert awake-not in any distress HEENT:atraumatic, normocephalic Chest: B/L clear to auscultation anteriorly CVS:S1S2 regular Abdomen:soft non tender, non distended Extremities:no edema Neurology: Non focal Skin: no rash   Pertinent Labs/Radiology: Hb:7.7  10/19>> RUQ ultrasound: No cholelithiasis, 5.3 cm lesion adjacent to the pancreatic head. 10/20>> CT abdomen/pelvis: 4.1 cm lesion in pancreatic head/body. 10/30>> CT abdomen/pelvis: 2.5 x 3.8 cm lesion in the pancreatic head/body. 10/31>> MRCP abdomen: Pancreatic neoplasm with hepatic metastatic disease, local nodal disease.  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   Assessment/Plan: Acute pancreatitis: Likely due to malignancy-clinically improving-abdominal pain has markedly improved-pain easily tolerated with oral narcotics-advancing to soft diet today.  Should be okay to saline lock all IV fluids today.  Newly diagnosed pancreatic neoplasm with hepatic/local metastases: IR to perform liver biopsy-we will speak with oncology later today   Leukocytosis: Likely due to pancreatic inflammation-leukocytosis slowly  improving-no other sources of infection apparent-follow.   AKI on CKD stage IIIb: AKI hemodynamically mediated-creatinine improved and close to baseline.  PAF: Maintaining sinus rhythm-anticoagulation on hold for liver biopsy today-should be able to resume over the next few days.   Normocytic anemia: No evidence of blood loss-suspect this is from acute illness and IV fluid dilution.  Watch closely-repeat CBC tomorrow.  History of complete heart block-s/p PPM placement: Continue telemetry monitoring  HTN: BP stable-continue amlodipine.  DM-2 (A1C 8.6): CBG stable-continue Lantus 50 units daily and SSI.  Follow and adjust  Recent Labs    10/08/21 0418 10/08/21 0758 10/08/21 1226  GLUCAP 170* 166* 167*     History of colon polyps/GERD: Claims recently had EGD/colonoscopy at [facility].    Morbid Obesity: Estimated body mass index is 51.19 kg/m as calculated from the following:   Height as of this encounter: 5\' 8"  (1.727 m).   Weight as of this encounter: 152.7 kg.    Procedures: None Consults: None DVT Prophylaxis: Eliquis Code Status:Full code Family Communication: Spouse at bedside today.  Time spent: 25 minutes-Greater than 50% of this time was spent in counseling, explanation of diagnosis, planning of further management, and coordination of care.  Diet: Diet Order             DIET SOFT Room service appropriate? Yes; Fluid consistency: Thin  Diet effective now                      Disposition Plan: Status is: Inpatient  Remains inpatient appropriate because: Acute pancreatitis-still with abdominal pain-n.p.o. status.  Work-up in progress-see above.   Antimicrobial agents: Anti-infectives (From admission, onward)    None        MEDICATIONS: Scheduled Meds:  amLODipine  10 mg Oral QHS   gabapentin  300 mg Oral TID   gelatin adsorbable       insulin aspart  0-9 Units Subcutaneous Q4H   lidocaine-EPINEPHrine       pantoprazole  40 mg Oral  Q1200   pravastatin  40 mg Oral q1800   sodium chloride flush  3 mL Intravenous Q12H   Continuous Infusions:  heparin Stopped (10/08/21 0956)   lactated ringers 125 mL/hr at 10/08/21 0957   PRN Meds:.acetaminophen **OR** acetaminophen, fentaNYL (SUBLIMAZE) injection, loratadine, LORazepam, Muscle Rub, ondansetron **OR** ondansetron (ZOFRAN) IV, oxyCODONE, polyethylene glycol   I have personally reviewed following labs and imaging studies  LABORATORY DATA: CBC: Recent Labs  Lab 10/05/21 0715 10/06/21 0148 10/07/21 0219 10/08/21 0254  WBC 16.5* 15.3* 13.7* 11.3*  HGB 10.8* 9.3* 9.2* 7.7*  HCT 34.8* 30.2* 29.9* 25.3*  MCV 87.0 86.8 86.4 86.6  PLT 432* 314 304 273     Basic Metabolic Panel: Recent Labs  Lab 10/05/21 0715 10/06/21 0148 10/07/21 0219 10/08/21 0254  NA 137 136 137 135  K 4.7 4.2 4.1 4.2  CL 101 102 104 102  CO2 27 25 27 28   GLUCOSE 262* 198* 100* 200*  BUN 9 10 9 9   CREATININE 1.63* 1.40* 1.43* 1.45*  CALCIUM 8.7* 8.3* 8.4* 7.9*  MG  --  1.8  --   --      GFR: Estimated Creatinine Clearance: 65.6 mL/min (A) (by C-G formula based on SCr of 1.45 mg/dL (H)).  Liver Function Tests: Recent Labs  Lab 10/05/21 0715 10/07/21 0219 10/08/21 0254  AST 11* 14* 12*  ALT 9 10 9   ALKPHOS 78 58 55  BILITOT 0.4 0.4 <0.1*  PROT 7.5 6.4* 5.7*  ALBUMIN 2.4* 2.0* 1.7*    Recent Labs  Lab 10/05/21 0715 10/07/21 0219  LIPASE 297* 124*    No results for input(s): AMMONIA in the last 168 hours.  Coagulation Profile: Recent Labs  Lab 10/06/21 1637  INR 1.5*     Cardiac Enzymes: No results for input(s): CKTOTAL, CKMB, CKMBINDEX, TROPONINI in the last 168 hours.  BNP (last 3 results) No results for input(s): PROBNP in the last 8760 hours.  Lipid Profile: No results for input(s): CHOL, HDL, LDLCALC, TRIG, CHOLHDL, LDLDIRECT in the last 72 hours.   Thyroid Function Tests: No results for input(s): TSH, T4TOTAL, FREET4, T3FREE, THYROIDAB in the  last 72 hours.  Anemia Panel: No results for input(s): VITAMINB12, FOLATE, FERRITIN, TIBC, IRON, RETICCTPCT in the last 72 hours.  Urine analysis:    Component Value Date/Time   COLORURINE YELLOW 10/05/2021 0715   APPEARANCEUR HAZY (A) 10/05/2021 0715   LABSPEC 1.030 10/05/2021 0715   PHURINE 5.0 10/05/2021 0715   GLUCOSEU >=500 (A) 10/05/2021 0715   HGBUR SMALL (A) 10/05/2021 0715   BILIRUBINUR NEGATIVE 10/05/2021 0715   KETONESUR NEGATIVE 10/05/2021 0715   PROTEINUR >=300 (A) 10/05/2021 0715   NITRITE NEGATIVE 10/05/2021 0715   LEUKOCYTESUR NEGATIVE 10/05/2021 0715    Sepsis Labs: Lactic Acid, Venous No results found for: LATICACIDVEN  MICROBIOLOGY: Recent Results (from the past 240 hour(s))  Resp Panel by RT-PCR (Flu A&B, Covid) Nasopharyngeal Swab     Status: None   Collection Time: 10/05/21 10:00 AM   Specimen: Nasopharyngeal Swab; Nasopharyngeal(NP) swabs in vial transport medium  Result Value Ref Range Status   SARS Coronavirus 2 by RT PCR NEGATIVE NEGATIVE Final    Comment: (NOTE) SARS-CoV-2 target nucleic acids are NOT DETECTED.  The SARS-CoV-2 RNA is generally detectable in upper respiratory specimens during the acute phase of infection. The lowest concentration of SARS-CoV-2 viral copies this assay can detect is 138 copies/mL. A negative result does not preclude SARS-Cov-2 infection and should not be used as the sole basis for treatment or other patient management decisions. A negative result may occur with  improper specimen collection/handling, submission of specimen other than nasopharyngeal swab, presence of viral mutation(s) within the areas targeted by this assay, and inadequate number of viral copies(<138 copies/mL). A negative result must be combined with clinical observations, patient history, and epidemiological information. The expected result is Negative.  Fact Sheet for Patients:  EntrepreneurPulse.com.au  Fact Sheet for  Healthcare Providers:  IncredibleEmployment.be  This test is no t yet approved or cleared by the [place] FDA and  has been authorized for detection and/or diagnosis of SARS-CoV-2 by FDA under an Emergency Use Authorization (EUA). This EUA will remain  in effect (meaning this test can be used) for the duration of the COVID-19 declaration under Section 564(b)(1) of the Act, 21 U.S.C.section 360bbb-3(b)(1), unless the authorization is terminated  or revoked sooner.       Influenza A by PCR NEGATIVE NEGATIVE Final   Influenza B by PCR NEGATIVE NEGATIVE Final    Comment: (NOTE) The Xpert Xpress SARS-CoV-2/FLU/RSV plus assay is intended as an aid in the diagnosis of influenza from Nasopharyngeal swab specimens and should not be used as a sole basis for treatment. Nasal washings and aspirates are unacceptable for Xpert Xpress SARS-CoV-2/FLU/RSV testing.  Fact Sheet for Patients: EntrepreneurPulse.com.au  Fact Sheet for Healthcare Providers: IncredibleEmployment.be  This test is not yet approved or cleared by the [place] FDA and has been authorized for detection and/or diagnosis of SARS-CoV-2 by FDA under an Emergency Use Authorization (EUA). This EUA will remain in effect (meaning this test can be used) for the duration of the COVID-19 declaration under Section 564(b)(1) of the Act, 21 U.S.C. section 360bbb-3(b)(1), unless the authorization is terminated or revoked.  Performed at [facility], [place] [address], [place], [place] [address]     RADIOLOGY STUDIES/RESULTS: MR ABDOMEN W WO CONTRAST  Result Date: 10/06/2021 CLINICAL DATA:  A 60 year old female with hypertension and hyperlipidemia presents for evaluation of pancreatic lesion versus is pancreatitis. EXAM: MRI ABDOMEN WITHOUT AND WITH CONTRAST TECHNIQUE: Multiplanar multisequence MR imaging of the abdomen was performed both before and after the  administration of intravenous contrast. CONTRAST:  71mL GADAVIST GADOBUTROL 1 MMOL/ML IV SOLN COMPARISON:  Comparison made with September 25, 2021. FINDINGS: Lower chest: No effusion, no consolidative process. Hepatobiliary: Lobular hepatic contours. Mild hepatomegaly, small LEFT lobe with 22 cm greatest craniocaudal dimension. No substantial steatosis. Focal hepatic lesion in the LEFT hepatic lobe (image 49/10) this does not meet criteria for a cyst and measures 1.5 cm. Similar lesion in the posterior RIGHT hepatic lobe measuring 1.5 cm (image 8/9) additional lesion in the medial segment of the LEFT hepatic lobe (image 24/9) 9 mm. Additional subtle foci of restricted diffusion in the lateral segment and the medial segment of the LEFT hepatic lobe, at least 2 additional tiny foci. These areas show subtle hypoenhancement and signs of restricted diffusion. No biliary duct dilation. No pericholecystic stranding. Pancreas: Mass in the pancreatic head measuring 4.9 x 4.9 cm. Central hypointensity compatible with central necrosis there is loss of the normal main pancreatic duct at this location, only mild ductal dilation is noted. Mass extending anteriorly. Mild infiltration of the soft tissues  around vascular structures and distortion of vascular structures, suspect least abutment of the portal vein based on its appearance. Common hepatic artery passes immediately posterior to the mass. Celiac trunk is uninvolved as is SMA as best as can be determined on MRI. Subtle nodularity in the transverse mesocolon. Surrounding nodal enlargement in the celiac with hepatic lesions as described. Lesion does not have a cystic appearance on T2 imaging. Spleen:  Unremarkable. Adrenals/Urinary Tract: Adrenal glands are normal. Symmetric renal enhancement without suspicious renal lesion. Stomach/Bowel: Duodenum and small bowel are inseparable from the mass at the head of the pancreas. Vascular/Lymphatic: Vascular structures and  relationships as described above. Nodal disease about the celiac axis, lymph nodes measuring up to a cm adjacent to the pancreas lymph nodes best seen on diffusion-weighted imaging. Supra pancreatic lymph node on image 103/11 and hepatic gastric lymph nodes on image 99/11 also with mild enlargement of portacaval lymph nodes. Other:  No ascites. Musculoskeletal: No suspicious bone lesions identified. 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 Subsequent local staging could be performed with pancreatic protocol CT and EUS as indicated. Local invasion of stomach and duodenum are suspected. Mild stranding about the pancreas could be indicative of mild superimposed pancreatitis or local tumor extension particularly into the transverse mesocolon. These results will be called to the ordering clinician or representative by the Radiologist Assistant, and communication documented in the PACS or [employer]. Electronically Signed   By: Zetta Bills M.D.   On: 10/06/2021 15:40     LOS: 3 days   Oren Binet, MD  [facility]    To contact the attending provider between 7A-7P or the covering provider during after hours 7P-7A, please log into the web site [email or web address] and access using universal [place] password for that web site. If you do not have the password, please call the hospital operator.  10/08/2021, 1:49 PM

## 2021-10-08 NOTE — Procedures (Signed)
Pre Procedure Dx: Concern for metastatic pancreatic cancer Post Procedural Dx: Same  Technically successful US guided biopsy of indeterminate lesion within the lateral segment of the left lobe of the liver.  EBL: None No immediate complications.   Ronny Bacon, MD Pager #: [phone]

## 2021-10-08 NOTE — Progress Notes (Signed)
ANTICOAGULATION CONSULT NOTE - Follow Up Consult  Pharmacy Consult for heparin Indication: atrial fibrillation  Labs: Recent Labs    10/06/21 0148 10/06/21 0148 10/06/21 1637 10/07/21 0219 10/07/21 0907 10/07/21 1816 10/07/21 1827 10/08/21 0254  HGB 9.3*  --   --  9.2*  --   --   --  7.7*  HCT 30.2*  --   --  29.9*  --   --   --  25.3*  PLT 314  --   --  304  --   --   --  273  APTT  --    < > 27 49* 56*  --  54* 88*  LABPROT  --   --  18.0*  --   --   --   --   --   INR  --   --  1.5*  --   --   --   --   --   HEPARINUNFRC  --   --  >1.10* >1.10*  --  >1.10*  --   --   CREATININE 1.40*  --   --  1.43*  --   --   --  1.45*   < > = values in this interval not displayed.    Assessment/Plan:  60yo female therapeutic on heparin after rate changes. Will continue infusion at current rate of 2150 units/hr and confirm stable with additional PTT.   Wynona Neat, PharmD, BCPS  10/08/2021,4:44 AM

## 2021-10-09 ENCOUNTER — Other Ambulatory Visit (HOSPITAL_COMMUNITY): Payer: Self-pay

## 2021-10-09 ENCOUNTER — Encounter (HOSPITAL_COMMUNITY): Payer: Self-pay | Admitting: Internal Medicine

## 2021-10-09 DIAGNOSIS — D649 Anemia, unspecified: Secondary | ICD-10-CM | POA: Diagnosis not present

## 2021-10-09 DIAGNOSIS — N179 Acute kidney failure, unspecified: Secondary | ICD-10-CM | POA: Diagnosis not present

## 2021-10-09 DIAGNOSIS — K219 Gastro-esophageal reflux disease without esophagitis: Secondary | ICD-10-CM

## 2021-10-09 DIAGNOSIS — K7689 Other specified diseases of liver: Secondary | ICD-10-CM | POA: Diagnosis not present

## 2021-10-09 DIAGNOSIS — E119 Type 2 diabetes mellitus without complications: Secondary | ICD-10-CM

## 2021-10-09 DIAGNOSIS — I10 Essential (primary) hypertension: Secondary | ICD-10-CM

## 2021-10-09 DIAGNOSIS — D72829 Elevated white blood cell count, unspecified: Secondary | ICD-10-CM

## 2021-10-09 DIAGNOSIS — K859 Acute pancreatitis without necrosis or infection, unspecified: Secondary | ICD-10-CM | POA: Diagnosis not present

## 2021-10-09 DIAGNOSIS — Z79899 Other long term (current) drug therapy: Secondary | ICD-10-CM

## 2021-10-09 DIAGNOSIS — E1142 Type 2 diabetes mellitus with diabetic polyneuropathy: Secondary | ICD-10-CM | POA: Diagnosis not present

## 2021-10-09 DIAGNOSIS — K8689 Other specified diseases of pancreas: Secondary | ICD-10-CM

## 2021-10-09 LAB — CBC
HCT: 26.6 % — ABNORMAL LOW (ref 36.0–46.0)
Hemoglobin: 8.2 g/dL — ABNORMAL LOW (ref 12.0–15.0)
MCH: 26.5 pg (ref 26.0–34.0)
MCHC: 30.8 g/dL (ref 30.0–36.0)
MCV: 86.1 fL (ref 80.0–100.0)
Platelets: 268 10*3/uL (ref 150–400)
RBC: 3.09 MIL/uL — ABNORMAL LOW (ref 3.87–5.11)
RDW: 12.7 % (ref 11.5–15.5)
WBC: 11 10*3/uL — ABNORMAL HIGH (ref 4.0–10.5)
nRBC: 0 % (ref 0.0–0.2)

## 2021-10-09 LAB — BASIC METABOLIC PANEL
Anion gap: 5 (ref 5–15)
BUN: 8 mg/dL (ref 6–20)
CO2: 29 mmol/L (ref 22–32)
Calcium: 8.1 mg/dL — ABNORMAL LOW (ref 8.9–10.3)
Chloride: 104 mmol/L (ref 98–111)
Creatinine, Ser: 1.47 mg/dL — ABNORMAL HIGH (ref 0.44–1.00)
GFR, Estimated: 41 mL/min — ABNORMAL LOW (ref >60)
Glucose, Bld: 283 mg/dL — ABNORMAL HIGH (ref 70–99)
Potassium: 4.4 mmol/L (ref 3.5–5.1)
Sodium: 138 mmol/L (ref 135–145)

## 2021-10-09 LAB — GLUCOSE, CAPILLARY
Glucose-Capillary: 255 mg/dL — ABNORMAL HIGH (ref 70–99)
Glucose-Capillary: 254 mg/dL — ABNORMAL HIGH (ref 70–99)

## 2021-10-09 LAB — HEPARIN LEVEL (UNFRACTIONATED): Heparin Unfractionated: 0.59 IU/mL (ref 0.30–0.70)

## 2021-10-09 LAB — MAGNESIUM: Magnesium: 1.9 mg/dL (ref 1.7–2.4)

## 2021-10-09 LAB — APTT: aPTT: 64 s — ABNORMAL HIGH (ref 24–36)

## 2021-10-09 MED ORDER — APIXABAN 5 MG PO TABS
5.0000 mg | ORAL_TABLET | Freq: Two times a day (BID) | ORAL | Status: DC
  Administered 2021-10-09: 5 mg via ORAL
  Filled 2021-10-09: qty 1

## 2021-10-09 MED ORDER — TRESIBA FLEXTOUCH 200 UNIT/ML ~~LOC~~ SOPN: 20.0000 [IU] | PEN_INJECTOR | Freq: Every evening | SUBCUTANEOUS | Status: DC

## 2021-10-09 MED ORDER — OXYCODONE HCL 10 MG PO TABS
10.0000 mg | ORAL_TABLET | Freq: Four times a day (QID) | ORAL | 0 refills | Status: DC | PRN
  Filled 2021-10-09: qty 25, 7d supply, fill #0

## 2021-10-09 NOTE — Progress Notes (Signed)
[facility] for Heparin and Apixaban Indication: atrial fibrillation  Allergies  Allergen Reactions   Penicillins Shortness Of Breath, Itching and Swelling    Has patient had a PCN reaction causing immediate rash, facial/tongue/throat swelling, SOB or lightheadedness with hypotension: Yes Has patient had a PCN reaction causing severe rash involving mucus membranes or skin necrosis:No Has patient had a PCN reaction that required hospitalization: No Has patient had a PCN reaction occurring within the last 10 years: no If all of the above answers are "NO", then may proceed with Cephalosporin use.      Patient Measurements: Height: 5\' 8"  (172.7 cm) Weight: (!) 152.7 kg (336 lb 10.3 oz) IBW/kg (Calculated) : 63.9  Heparin Dosing Weight: 101.7 kg  Vital Signs: Temp: 98.2 F (36.8 C) (11/03 0750) Temp Source: Oral (11/03 0750) BP: 128/65 (11/03 0750) Pulse Rate: 83 (11/03 0750)  Labs: Recent Labs    10/06/21 1637 10/06/21 1637 10/07/21 0219 10/07/21 0907 10/07/21 1816 10/07/21 1827 10/08/21 0254 10/08/21 1025 10/09/21 0158  HGB  --    < > 9.2*  --   --   --  7.7*  --  8.2*  HCT  --   --  29.9*  --   --   --  25.3*  --  26.6*  PLT  --   --  304  --   --   --  273  --  268  APTT 27  --  49*   < >  --    < > 88* 50* 64*  LABPROT 18.0*  --   --   --   --   --   --   --   --   INR 1.5*  --   --   --   --   --   --   --   --   HEPARINUNFRC >1.10*  --  >1.10*  --  >1.10*  --   --   --  0.59  CREATININE  --   --  1.43*  --   --   --  1.45*  --  1.47*   < > = values in this interval not displayed.    Estimated Creatinine Clearance: 64.7 mL/min (A) (by C-G formula based on SCr of 1.47 mg/dL (H)).   Medications:  Scheduled:   amLODipine  10 mg Oral QHS   gabapentin  300 mg Oral TID   insulin aspart  0-5 Units Subcutaneous QHS   insulin aspart  0-9 Units Subcutaneous TID WC   pantoprazole  40 mg Oral Q1200   pravastatin  40 mg Oral q1800    sodium chloride flush  3 mL Intravenous Q12H   Infusions:   heparin 2,250 Units/hr (10/09/21 4332)    Assessment: 60 YO female with newly diagnosed pancreatic cancer now in need of a biopsy. Patient was on Eliquis due to A. Fib PTA. Heparin infusion started 12 hours after her last dose of Eliquis (10/31 @ 0900) without a bolus.   Heparin level remains false elevated due to recent Eliquis administration.11/02 heparin infusion was stopped for IR procedure. Heparin infusion was restarted at 2150 units/hour at 1800. IR procedure is complete, patient now stable to be transitioned back to apixaban.   Goal of Therapy:  Heparin level 0.3-0.7 units/ml APTT level: 66-102 APTT and heparin level do not correlate. Will titrate using APTT levels Monitor platelets by anticoagulation protocol: Yes  Plan:  Stop heparin infusion at 1200 Give apixaban  5mg  twice daily, first dose at 1200 Check heparin level daily while on heparin Continue to monitor H&H and platelets    Thank you for allowing pharmacy to be a part of this patient's care.  Ardyth Harps, PharmD Clinical Pharmacist

## 2021-10-09 NOTE — Discharge Summary (Signed)
PATIENT DETAILS Name: Leslie Mullen Age: 60 y.o. Sex: female Date of Birth: 27-Aug-1961 MRN: [id number]. Admitting Physician: Norval Morton, MD PTW:[id number], Lu Duffel, PA-C  Admit Date: 10/05/2021 Discharge date: 10/09/2021  Recommendations for Outpatient Follow-up:  Follow up with PCP in 1-2 weeks Please obtain CMP/CBC in one week Liver biopsy pending-please follow Please ensure follow-up with oncology on discharge.  Admitted From:  Home  Disposition: [place]: No  Equipment/Devices: None  Discharge Condition: Stable  CODE STATUS: FULL CODE  Diet recommendation:  Diet Order             Diet - low sodium heart healthy           Diet regular Room service appropriate? Yes; Fluid consistency: Thin  Diet effective now                    Brief Summary: Patient is a 60 y.o. female with history of HTN, HLD, PAF, complete heart block-s/p PPM implantation November 2021-presented with epigastric pain for approximately 1 month-she was thought to have pancreatitis-and subsequently admitted to the hospitalist service.  Pertinent Labs/Radiology:  10/19>> RUQ ultrasound: No cholelithiasis, 5.3 cm lesion adjacent to the pancreatic head. 10/20>> CT abdomen/pelvis: 4.1 cm lesion in pancreatic head/body. 10/30>> CT abdomen/pelvis: 2.5 x 3.8 cm lesion in the pancreatic head/body. 10/31>> MRCP abdomen: Pancreatic neoplasm with hepatic metastatic disease, local nodal disease.  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  Brief Hospital Course: Acute pancreatitis: Likely due to malignancy-clinically improved-pain is significantly less and easily controlled with oral narcotics.  Tolerating soft diet.  Continue as needed narcotics on discharge.   Newly diagnosed pancreatic neoplasm with hepatic/local metastases: IR performed liver biopsy-results pending.  Will be evaluated by oncology prior to discharge-oncology will then  arrange for further work-up/evaluation to be done in the outpatient setting.    Leukocytosis: Likely due to pancreatic inflammation-leukocytosis has resolved.  No other sources of infection apparent.    AKI on CKD stage IIIb: AKI hemodynamically mediated-creatinine improved and close to baseline.   PAF: Maintaining sinus rhythm-Eliquis was held for liver biopsy-and patient was briefly placed on IV heparin-resuming Eliquis on discharge.    Normocytic anemia: No evidence of blood loss-suspect this is from acute illness and IV fluid dilution.  Hemoglobin stable-continue to watch CBC closely in the outpatient setting.    History of complete heart block-s/p PPM placement   HTN: BP stable-continue amlodipine.  Continue to hold lisinopril-and follow with PCP to see if this can be resumed.   DM-2 (A1C 8.6): CBGs on the higher side-we will asked patient to resume 20 units of Levemir on discharge and slowly titrate up to her usual regimen of 80 units of Levemir.    Morbid Obesity: Estimated body mass index is 51.19 kg/m as calculated from the following:   Height as of this encounter: 5\' 8"  (1.727 m).   Weight as of this encounter: 152.7 kg.    Procedures 11/2>> ultrasound-guided liver biopsy.  Discharge Diagnoses:  Principal Problem:   Pancreatitis Active Problems:   Type 2 diabetes mellitus with diabetic polyneuropathy, with long-term current use of insulin (HCC)   Heart block   Endometrial cancer (HCC)   Paroxysmal atrial fibrillation (HCC)   Leukocytosis   AKI (acute kidney injury) ([place])   Chronic anticoagulation   Thrombocytosis   Discharge Instructions:  Activity:  As tolerated with Full fall precautions use walker/cane & assistance as needed   Discharge  Instructions     Call MD for:  persistant nausea and vomiting   Complete by: As directed    Call MD for:  severe uncontrolled pain   Complete by: As directed    Diet - low sodium heart healthy   Complete by: As  directed    Discharge instructions   Complete by: As directed    Follow with Primary MD  Sue Lush, PA-C in 1-2 weeks  Please get a complete blood count and chemistry panel checked by your Primary MD at your next visit, and again as instructed by your Primary MD.  Get Medicines reviewed and adjusted: Please take all your medications with you for your next visit with your Primary MD  Laboratory/radiological data: Please request your Primary MD to go over all hospital tests and procedure/radiological results at the follow up, please ask your Primary MD to get all Hospital records sent to his/her office.  In some cases, they will be blood work, cultures and biopsy results pending at the time of your discharge. Please request that your primary care M.D. follows up on these results.  Also Note the following: If you experience worsening of your admission symptoms, develop shortness of breath, life threatening emergency, suicidal or homicidal thoughts you must seek medical attention immediately by calling 911 or calling your MD immediately  if symptoms less severe.  You must read complete instructions/literature along with all the possible adverse reactions/side effects for all the Medicines you take and that have been prescribed to you. Take any new Medicines after you have completely understood and accpet all the possible adverse reactions/side effects.   Do not drive when taking Pain medications or sleeping medications (Benzodaizepines)  Do not take more than prescribed Pain, Sleep and Anxiety Medications. It is not advisable to combine anxiety,sleep and pain medications without talking with your primary care practitioner  Special Instructions: If you have smoked or chewed Tobacco  in the last 2 yrs please stop smoking, stop any regular Alcohol  and or any Recreational drug use.  Wear Seat belts while driving.  Please note: You were cared for by a hospitalist during your hospital  stay. Once you are discharged, your primary care physician will handle any further medical issues. Please note that NO REFILLS for any discharge medications will be authorized once you are discharged, as it is imperative that you return to your primary care physician (or establish a relationship with a primary care physician if you do not have one) for your post hospital discharge needs so that they can reassess your need for medications and monitor your lab values.   1.)  Since you are nothing by mouth-and on clear liquids for the first few days of hospital stay-your insulin regimen was adjusted.  Please restart at Levemir 20 units daily and slowly increase it to your usual regimen if your blood glucose levels are on the higher side.  2.)  Your liver biopsy results are pending at the time of discharge-please ask your primary oncologist/primary care practitioner to follow-up on these results.  3.)  Oncology will call you with a follow-up appointment.  If you do not hear from them-please give them a call.   Increase activity slowly   Complete by: As directed    No wound care   Complete by: As directed       Allergies as of 10/09/2021       Reactions   Penicillins Shortness Of Breath, Itching, Swelling   Has patient  had a PCN reaction causing immediate rash, facial/tongue/throat swelling, SOB or lightheadedness with hypotension: Yes Has patient had a PCN reaction causing severe rash involving mucus membranes or skin necrosis:No Has patient had a PCN reaction that required hospitalization: No Has patient had a PCN reaction occurring within the last 10 years: no If all of the above answers are "NO", then may proceed with Cephalosporin use.          Medication List     STOP taking these medications    lisinopril 20 MG tablet Commonly known as: ZESTRIL   oxyCODONE-acetaminophen 5-325 MG tablet Commonly known as: PERCOCET/ROXICET   traMADol 50 MG tablet Commonly known as: ULTRAM        TAKE these medications    amLODipine 10 MG tablet Commonly known as: NORVASC Take 1 tablet (10 mg total) by mouth at bedtime.   apixaban 5 MG Tabs tablet Commonly known as: Eliquis Take 1 tablet (5 mg total) by mouth 2 (two) times daily.   gabapentin 300 MG capsule Commonly known as: NEURONTIN Take 1 capsule (300 mg total) by mouth 2 (two) times daily. What changed: when to take this   glucose blood test strip   Insulin Pen Needle 31G X 5 MM Misc   loratadine 10 MG tablet Commonly known as: CLARITIN Take 10 mg by mouth daily as needed for allergies.   omeprazole 40 MG capsule Commonly known as: PRILOSEC Take 40 mg by mouth 2 (two) times daily.   ondansetron 4 MG disintegrating tablet Commonly known as: Zofran ODT Take 1 tablet (4 mg total) by mouth every 8 (eight) hours as needed for nausea or vomiting.   OneTouch Delica Lancets 92Z Misc   Oxycodone HCl 10 MG Tabs Take 1 tablet (10 mg total) by mouth every 6 (six) hours as needed for moderate pain.   polyethylene glycol 17 g packet Commonly known as: MIRALAX / GLYCOLAX Take 17 g by mouth daily as needed for moderate constipation.   pravastatin 40 MG tablet Commonly known as: PRAVACHOL TAKE 1 TABLET(40 MG) BY MOUTH DAILY   Synjardy XR 12.04-999 MG Tb24 Generic drug: Empagliflozin-metFORMIN HCl ER Take 1 tablet by mouth 2 (two) times daily.   Tyler Aas FlexTouch 200 UNIT/ML FlexTouch Pen Generic drug: insulin degludec Inject 20 Units into the skin every evening. Start at 20 units daily-if you blood glucose levels are still on the higher side-slowly increase back to your usual regimen of 80 units daily. What changed:  how much to take additional instructions   trolamine salicylate 10 % cream Commonly known as: ASPERCREME Apply 1 application topically 2 (two) times daily as needed for muscle pain.        Follow-up Information     Sue Lush, PA-C Follow up.   Specialty: Physician Assistant Why:  schedule follow up in 1-2 weeks Contact information: [place] [address] [phone]         Vickie Epley, MD Follow up in 1 month(s).   Specialties: Cardiology, Radiology Contact information: [place] [address] [phone]         Josue Hector, MD Follow up in 1 month(s).   Specialty: Cardiology Contact information: [address] [place] [address] [phone]         Truitt Merle, MD Follow up.   Specialties: Hematology, Oncology Why: Office will call with date/time, If you dont hear from them,please give them a call  Contact information: [address] [place] [address] [phone]                Allergies  Allergen Reactions   Penicillins Shortness Of Breath, Itching and Swelling    Has patient had a PCN reaction causing immediate rash, facial/tongue/throat swelling, SOB or lightheadedness with hypotension: Yes Has patient had a PCN reaction causing severe rash involving mucus membranes or skin necrosis:No Has patient had a PCN reaction that required hospitalization: No Has patient had a PCN reaction occurring within the last 10 years: no If all of the above answers are "NO", then may proceed with Cephalosporin use.        Consultations: Oncology, interventional radiology   Other Procedures/Studies: CT ABDOMEN PELVIS WO CONTRAST  Result Date: 10/05/2021 CLINICAL DATA:  Abdominal pain, acute, nonlocalized. R sided rib pain for one week EXAM: CT ABDOMEN AND PELVIS WITHOUT CONTRAST TECHNIQUE: Multidetector CT imaging of the abdomen and pelvis was performed following the standard protocol without IV contrast. COMPARISON:  CT abdomen pelvis 09/25/2021, ultrasound abdomen 09/24/2021 FINDINGS: Lower chest: Cardiac leads partially noted. Hepatobiliary: No focal liver abnormality. No gallstones, gallbladder wall thickening, or pericholecystic fluid. No  biliary dilatation. Pancreas: Difficult to visualize persistent at least 2.5 x 3.8 cm hypodense lesion within the region of the pancreatic head/body. Normal pancreatic contour. No surrounding inflammatory changes. No main pancreatic ductal dilatation. Spleen: Normal in size without focal abnormality. Adrenals/Urinary Tract: No adrenal nodule bilaterally. No nephrolithiasis and no hydronephrosis. No definite contour-deforming renal mass. No ureterolithiasis or hydroureter. The urinary bladder is unremarkable. Stomach/Bowel: Stomach is within normal limits. No evidence of bowel wall thickening or dilatation. Scattered colonic diverticulosis. Appendix appears normal. Vascular/Lymphatic: No intraperitoneal free fluid. No intraperitoneal free gas. No organized fluid collection. Reproductive: A t shaped intrauterine device is noted within the uterus in grossly appropriate position. Uterus and bilateral adnexa are unremarkable. Other: No intraperitoneal free fluid. No intraperitoneal free gas. No organized fluid collection. Musculoskeletal: There is a 2 cm fat containing umbilical hernia. No suspicious lytic or blastic osseous lesions. No acute displaced fracture. Grade 1 anterolisthesis of L4 on L5. Multilevel degenerative changes of the spine. IMPRESSION: 1. Difficult to visualize on this noncontrast study persistent at least 2.5 x 3.8 cm hypodense lesion within the region of the pancreatic head/body. Recommend nonemergent MRI pancreatic protocol further evaluation. When the patient is clinically stable and able to follow directions and hold their breath (preferably as an outpatient) further evaluation with dedicated abdominal MRI should be considered. 2. Scattered colonic diverticulosis with no acute diverticulitis. 3. T-shaped intrauterine device in grossly appropriate position. 4.  Aortic Atherosclerosis (ICD10-I70.0). Electronically Signed   By: Iven Finn M.D.   On: 10/05/2021 15:47   MR ABDOMEN W WO  CONTRAST  Result Date: 10/06/2021 CLINICAL DATA:  A 60 year old female with hypertension and hyperlipidemia presents for evaluation of pancreatic lesion versus is pancreatitis. EXAM: MRI ABDOMEN WITHOUT AND WITH CONTRAST TECHNIQUE: Multiplanar multisequence MR imaging of the abdomen was performed both before and after the administration of intravenous contrast. CONTRAST:  74mL GADAVIST GADOBUTROL 1 MMOL/ML IV SOLN COMPARISON:  Comparison made with September 25, 2021. FINDINGS: Lower chest: No effusion, no consolidative process. Hepatobiliary: Lobular hepatic contours. Mild hepatomegaly, small LEFT lobe with 22 cm greatest craniocaudal dimension. No substantial steatosis. Focal hepatic lesion in the LEFT hepatic lobe (image 49/10) this does not meet criteria for a cyst and measures 1.5 cm. Similar lesion in the posterior RIGHT hepatic lobe measuring 1.5 cm (image  8/9) additional lesion in the medial segment of the LEFT hepatic lobe (image 24/9) 9 mm. Additional subtle foci of restricted diffusion in the lateral segment and the medial segment of the LEFT hepatic lobe, at least 2 additional tiny foci. These areas show subtle hypoenhancement and signs of restricted diffusion. No biliary duct dilation. No pericholecystic stranding. Pancreas: Mass in the pancreatic head measuring 4.9 x 4.9 cm. Central hypointensity compatible with central necrosis there is loss of the normal main pancreatic duct at this location, only mild ductal dilation is noted. Mass extending anteriorly. Mild infiltration of the soft tissues around vascular structures and distortion of vascular structures, suspect least abutment of the portal vein based on its appearance. Common hepatic artery passes immediately posterior to the mass. Celiac trunk is uninvolved as is SMA as best as can be determined on MRI. Subtle nodularity in the transverse mesocolon. Surrounding nodal enlargement in the celiac with hepatic lesions as described. Lesion does not  have a cystic appearance on T2 imaging. Spleen:  Unremarkable. Adrenals/Urinary Tract: Adrenal glands are normal. Symmetric renal enhancement without suspicious renal lesion. Stomach/Bowel: Duodenum and small bowel are inseparable from the mass at the head of the pancreas. Vascular/Lymphatic: Vascular structures and relationships as described above. Nodal disease about the celiac axis, lymph nodes measuring up to a cm adjacent to the pancreas lymph nodes best seen on diffusion-weighted imaging. Supra pancreatic lymph node on image 103/11 and hepatic gastric lymph nodes on image 99/11 also with mild enlargement of portacaval lymph nodes. Other:  No ascites. Musculoskeletal: No suspicious bone lesions identified. 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 Subsequent local staging could be performed with pancreatic protocol CT and EUS as indicated. Local invasion of stomach and duodenum are suspected. Mild stranding about the pancreas could be indicative of mild superimposed pancreatitis or local tumor extension particularly into the transverse mesocolon. These results will be called to the ordering clinician or representative by the Radiologist Assistant, and communication documented in the PACS or [employer]. Electronically Signed   By: Zetta Bills M.D.   On: 10/06/2021 15:40   CT ABDOMEN PELVIS W CONTRAST  Result Date: 09/25/2021 CLINICAL DATA:  Elevated lipase, acute pancreatitis, abnormal ultrasound EXAM: CT ABDOMEN AND PELVIS WITH CONTRAST TECHNIQUE: Multidetector CT imaging of the abdomen and pelvis was performed using the standard protocol following bolus administration of intravenous contrast. CONTRAST:  84mL OMNIPAQUE IOHEXOL 350 MG/ML SOLN COMPARISON:  Right upper quadrant ultrasound dated  09/24/2021 FINDINGS: Lower chest: Lung bases are clear. Hepatobiliary: Liver is within normal limits. Gallbladder is unremarkable. No intrahepatic or extrahepatic ductal dilatation. Pancreas: 4.1 x 3.7 cm low-density lesion in the pancreatic head/body (series 3/image 31), possibly reflecting focal pancreatitis given the clinical history. No pancreatic ductal dilatation in the pancreatic tail. Spleen: Within normal limits. Adrenals/Urinary Tract: Adrenal glands are within normal limits. Kidneys are within normal limits.  No hydronephrosis. Bladder is within normal limits. Stomach/Bowel: Stomach is within normal limits. No evidence of bowel obstruction. Appendix is not discretely visualized. Sigmoid diverticulosis, without evidence of diverticulitis. Vascular/Lymphatic: No evidence of abdominal aortic aneurysm. Small upper abdominal lymph nodes, including a 13 mm short axis node in the porta hepatis (series 3/image 25), likely reactive. Reproductive: Uterus is notable for an IUD in satisfactory position. No adnexal masses. Other: No abdominopelvic ascites. Small fat containing periumbilical  hernia (series 3/image 62). Musculoskeletal: Degenerative changes of the visualized thoracolumbar spine. IMPRESSION: 4.1 cm low-density lesion in the pancreatic head/body, possibly reflecting focal pancreatitis given the clinical history. However, this warrants short-term follow-up in 4-6 weeks to document improvement/resolution. Alternatively, EUS could be considered for tissue sampling as clinically warranted. Electronically Signed   By: Julian Hy M.D.   On: 09/25/2021 01:03   US BIOPSY (LIVER)  Result Date: 10/08/2021 INDICATION: Concern for metastatic pancreatic cancer. Please perform ultrasound-guided liver lesion biopsy for tissue diagnostic purposes. EXAM: ULTRASOUND GUIDED LIVER LESION BIOPSY COMPARISON:  CT abdomen pelvis-10/05/2021; abdominal MRI-10/06/2021 MEDICATIONS: None ANESTHESIA/SEDATION: Moderate  (conscious) sedation was employed during this procedure as administered by the Interventional Radiology RN. A total of Versed 1 mg and Fentanyl 50 mcg was administered intravenously. Moderate Sedation Time: 14 minutes. The patient's level of consciousness and vital signs were monitored continuously by radiology nursing throughout the procedure under my direct supervision. COMPLICATIONS: None immediate. PROCEDURE: Informed written consent was obtained from the patient after a discussion of the risks, benefits and alternatives to treatment. The patient understands and consents the procedure. A timeout was performed prior to the initiation of the procedure. Ultrasound scanning was performed of the right upper abdominal quadrant demonstrates an approximately 2.1 x 1.5 cm hypoechoic lesion within the caudal subcapsular aspect of the left lobe of the liver (image 3). The procedure was planned. The midline of the abdomen was prepped and draped in the usual sterile fashion. The overlying soft tissues were anesthetized with 1% lidocaine with epinephrine. A 17 gauge, 6.8 cm co-axial needle was advanced into a peripheral aspect of the lesion. This was followed by 5 core biopsies with an 18 gauge core device under direct ultrasound guidance. The coaxial needle tract was embolized with a small amount of Gel-Foam slurry and superficial hemostasis was obtained with manual compression. Post procedural scanning was negative for definitive area of hemorrhage or additional complication. A dressing was applied. The patient tolerated the procedure well without immediate post procedural complication. IMPRESSION: Technically successful ultrasound guided core needle biopsy of ill-defined hypoechoic lesion within the caudal subcapsular aspect of the left lobe of the liver. Electronically Signed   By: Sandi Mariscal M.D.   On: 10/08/2021 14:58   US Abdomen Limited RUQ (LIVER/GB)  Result Date: 09/24/2021 CLINICAL DATA:  Right upper quadrant  abdominal pain EXAM: ULTRASOUND ABDOMEN LIMITED RIGHT UPPER QUADRANT COMPARISON:  None. FINDINGS: Gallbladder: No gallstones or wall thickening visualized. No sonographic Murphy sign noted by sonographer. Common bile duct: Diameter: 4 mm Liver: No focal lesion identified. Within normal limits in parenchymal echogenicity. Portal vein is patent on color Doppler imaging with normal direction of blood flow towards the liver. Other: 4.9 x 4.9 x 5.3 cm lesion adjacent to the pancreatic head. While this may reflect a loop of bowel, a mass is not excluded. IMPRESSION: 5.3 cm lesion adjacent to the pancreatic head. While this may reflect a loop of bowel, a mass is not excluded. Consider CT abdomen with contrast for further evaluation. Electronically Signed   By: Julian Hy M.D.   On: 09/24/2021 23:27     TODAY-DAY OF DISCHARGE:  Subjective:   Leslie Mullen today has no headache,no chest abdominal pain,no new weakness tingling or numbness, feels much better wants to go home today.   Objective:   Blood pressure 127/72, pulse 87, temperature 98.2 F (36.8 C), temperature source Oral, resp. rate 20, height 5\' 8"  (1.727 m), weight (!) 152.7 kg, SpO2 97 %.  Intake/Output Summary (Last 24 hours) at 10/09/2021 1448 Last data filed at 10/09/2021 1226 Gross per 24 hour  Intake 700.32 ml  Output --  Net 700.32 ml   Filed Weights   10/05/21 1000 10/05/21 2157  Weight: (!) 158.8 kg (!) 152.7 kg    Exam: Awake Alert, Oriented *3, No new F.N deficits, Normal affect [place].AT,PERRAL Supple Neck,No JVD, No cervical lymphadenopathy appriciated.  Symmetrical Chest wall movement, Good air movement bilaterally, CTAB RRR,No Gallops,Rubs or new Murmurs, No Parasternal Heave +ve B.Sounds, Abd Soft, Non tender, No organomegaly appriciated, No rebound -guarding or rigidity. No Cyanosis, Clubbing or edema, No new Rash or bruise   PERTINENT RADIOLOGIC STUDIES: US BIOPSY (LIVER)  Result Date: 10/08/2021 INDICATION:  Concern for metastatic pancreatic cancer. Please perform ultrasound-guided liver lesion biopsy for tissue diagnostic purposes. EXAM: ULTRASOUND GUIDED LIVER LESION BIOPSY COMPARISON:  CT abdomen pelvis-10/05/2021; abdominal MRI-10/06/2021 MEDICATIONS: None ANESTHESIA/SEDATION: Moderate (conscious) sedation was employed during this procedure as administered by the Interventional Radiology RN. A total of Versed 1 mg and Fentanyl 50 mcg was administered intravenously. Moderate Sedation Time: 14 minutes. The patient's level of consciousness and vital signs were monitored continuously by radiology nursing throughout the procedure under my direct supervision. COMPLICATIONS: None immediate. PROCEDURE: Informed written consent was obtained from the patient after a discussion of the risks, benefits and alternatives to treatment. The patient understands and consents the procedure. A timeout was performed prior to the initiation of the procedure. Ultrasound scanning was performed of the right upper abdominal quadrant demonstrates an approximately 2.1 x 1.5 cm hypoechoic lesion within the caudal subcapsular aspect of the left lobe of the liver (image 3). The procedure was planned. The midline of the abdomen was prepped and draped in the usual sterile fashion. The overlying soft tissues were anesthetized with 1% lidocaine with epinephrine. A 17 gauge, 6.8 cm co-axial needle was advanced into a peripheral aspect of the lesion. This was followed by 5 core biopsies with an 18 gauge core device under direct ultrasound guidance. The coaxial needle tract was embolized with a small amount of Gel-Foam slurry and superficial hemostasis was obtained with manual compression. Post procedural scanning was negative for definitive area of hemorrhage or additional complication. A dressing was applied. The patient tolerated the procedure well without immediate post procedural complication. IMPRESSION: Technically successful ultrasound guided  core needle biopsy of ill-defined hypoechoic lesion within the caudal subcapsular aspect of the left lobe of the liver. Electronically Signed   By: Sandi Mariscal M.D.   On: 10/08/2021 14:58     PERTINENT LAB RESULTS: CBC: Recent Labs    10/08/21 0254 10/09/21 0158  WBC 11.3* 11.0*  HGB 7.7* 8.2*  HCT 25.3* 26.6*  PLT 273 268   CMET CMP     Component Value Date/Time   NA 138 10/09/2021 0158   NA 137 12/22/2019 1712   K 4.4 10/09/2021 0158   CL 104 10/09/2021 0158   CO2 29 10/09/2021 0158   GLUCOSE 283 (H) 10/09/2021 0158   BUN 8 10/09/2021 0158   BUN 25 (H) 12/22/2019 1712   CREATININE 1.47 (H) 10/09/2021 0158   CALCIUM 8.1 (L) 10/09/2021 0158   PROT 5.7 (L) 10/08/2021 0254   PROT 7.0 12/22/2019 1712   ALBUMIN 1.7 (L) 10/08/2021 0254   ALBUMIN 3.6 (L) 12/22/2019 1712   AST 12 (L) 10/08/2021 0254   ALT 9 10/08/2021 0254   ALKPHOS 55 10/08/2021 0254   BILITOT <0.1 (L) 10/08/2021 0254   BILITOT <0.2 12/22/2019  [place] (L) 10/09/2021 0158   GFRAA 49 (L) 12/22/2019 1712    GFR Estimated Creatinine Clearance: 64.7 mL/min (A) (by C-G formula based on SCr of 1.47 mg/dL (H)). Recent Labs    10/07/21 0219  LIPASE 124*   No results for input(s): CKTOTAL, CKMB, CKMBINDEX, TROPONINI in the last 72 hours. Invalid input(s): POCBNP No results for input(s): DDIMER in the last 72 hours. No results for input(s): HGBA1C in the last 72 hours. No results for input(s): CHOL, HDL, LDLCALC, TRIG, CHOLHDL, LDLDIRECT in the last 72 hours. No results for input(s): TSH, T4TOTAL, T3FREE, THYROIDAB in the last 72 hours.  Invalid input(s): FREET3 No results for input(s): VITAMINB12, FOLATE, FERRITIN, TIBC, IRON, RETICCTPCT in the last 72 hours. Coags: Recent Labs    10/06/21 1637  INR 1.5*   Microbiology: Recent Results (from the past 240 hour(s))  Resp Panel by RT-PCR (Flu A&B, Covid) Nasopharyngeal Swab     Status: None   Collection Time: 10/05/21 10:00 AM   Specimen:  Nasopharyngeal Swab; Nasopharyngeal(NP) swabs in vial transport medium  Result Value Ref Range Status   SARS Coronavirus 2 by RT PCR NEGATIVE NEGATIVE Final    Comment: (NOTE) SARS-CoV-2 target nucleic acids are NOT DETECTED.  The SARS-CoV-2 RNA is generally detectable in upper respiratory specimens during the acute phase of infection. The lowest concentration of SARS-CoV-2 viral copies this assay can detect is 138 copies/mL. A negative result does not preclude SARS-Cov-2 infection and should not be used as the sole basis for treatment or other patient management decisions. A negative result may occur with  improper specimen collection/handling, submission of specimen other than nasopharyngeal swab, presence of viral mutation(s) within the areas targeted by this assay, and inadequate number of viral copies(<138 copies/mL). A negative result must be combined with clinical observations, patient history, and epidemiological information. The expected result is Negative.  Fact Sheet for Patients:  EntrepreneurPulse.com.au  Fact Sheet for Healthcare Providers:  IncredibleEmployment.be  This test is no t yet approved or cleared by the [place] FDA and  has been authorized for detection and/or diagnosis of SARS-CoV-2 by FDA under an Emergency Use Authorization (EUA). This EUA will remain  in effect (meaning this test can be used) for the duration of the COVID-19 declaration under Section 564(b)(1) of the Act, 21 U.S.C.section 360bbb-3(b)(1), unless the authorization is terminated  or revoked sooner.       Influenza A by PCR NEGATIVE NEGATIVE Final   Influenza B by PCR NEGATIVE NEGATIVE Final    Comment: (NOTE) The Xpert Xpress SARS-CoV-2/FLU/RSV plus assay is intended as an aid in the diagnosis of influenza from Nasopharyngeal swab specimens and should not be used as a sole basis for treatment. Nasal washings and aspirates are unacceptable for  Xpert Xpress SARS-CoV-2/FLU/RSV testing.  Fact Sheet for Patients: EntrepreneurPulse.com.au  Fact Sheet for Healthcare Providers: IncredibleEmployment.be  This test is not yet approved or cleared by the [place] FDA and has been authorized for detection and/or diagnosis of SARS-CoV-2 by FDA under an Emergency Use Authorization (EUA). This EUA will remain in effect (meaning this test can be used) for the duration of the COVID-19 declaration under Section 564(b)(1) of the Act, 21 U.S.C. section 360bbb-3(b)(1), unless the authorization is terminated or revoked.  Performed at [facility], [place] [address], [place], [place] [address]     FURTHER DISCHARGE INSTRUCTIONS:  Get Medicines reviewed and adjusted: Please take all your medications with you for your next visit  with your Primary MD  Laboratory/radiological data: Please request your Primary MD to go over all hospital tests and procedure/radiological results at the follow up, please ask your Primary MD to get all Hospital records sent to his/her office.  In some cases, they will be blood work, cultures and biopsy results pending at the time of your discharge. Please request that your primary care M.D. goes through all the records of your hospital data and follows up on these results.  Also Note the following: If you experience worsening of your admission symptoms, develop shortness of breath, life threatening emergency, suicidal or homicidal thoughts you must seek medical attention immediately by calling 911 or calling your MD immediately  if symptoms less severe.  You must read complete instructions/literature along with all the possible adverse reactions/side effects for all the Medicines you take and that have been prescribed to you. Take any new Medicines after you have completely understood and accpet all the possible adverse reactions/side effects.   Do not drive when taking Pain  medications or sleeping medications (Benzodaizepines)  Do not take more than prescribed Pain, Sleep and Anxiety Medications. It is not advisable to combine anxiety,sleep and pain medications without talking with your primary care practitioner  Special Instructions: If you have smoked or chewed Tobacco  in the last 2 yrs please stop smoking, stop any regular Alcohol  and or any Recreational drug use.  Wear Seat belts while driving.  Please note: You were cared for by a hospitalist during your hospital stay. Once you are discharged, your primary care physician will handle any further medical issues. Please note that NO REFILLS for any discharge medications will be authorized once you are discharged, as it is imperative that you return to your primary care physician (or establish a relationship with a primary care physician if you do not have one) for your post hospital discharge needs so that they can reassess your need for medications and monitor your lab values.  Total Time spent coordinating discharge including counseling, education and face to face time equals 35 minutes.  SignedOren Binet 10/09/2021 2:48 PM

## 2021-10-09 NOTE — Progress Notes (Signed)
Spoke with Dr. Burr Medico. Dr. Burr Medico stated she was on her way to speak with pt shortly.

## 2021-10-09 NOTE — Progress Notes (Signed)
Pt stated that her daughter would pick her up after 5pm when she gets off of work.

## 2021-10-09 NOTE — Consult Note (Addendum)
[place]  [phone] [phone]   MEDICAL ONCOLOGY - INITIAL CONSULTATION  Referral MD: Dr. Oren Binet  Reason for Referral: Pancreatic mass, liver lesions  HPI: Leslie Mullen is a 60 year old female with a past medical history significant for hypertension, hyperlipidemia, PAF on Eliquis, complete heart block status post PPM implantation 10/2020, diabetes mellitus, obesity.  She presented to the emergency department with abdominal pain x1 month.  Pain worse after she tries to eat or drink.  Pain is located in the epigastric area and to the right side of the upper abdomen.  She was previously evaluated in the emergency department for similar pain on 09/24/2021.  She had an abdominal ultrasound performed on that date which showed a 5.3 cm lesion adjacent to the pancreatic head.  This was followed with a CT of the abdomen/pelvis which showed a 4.1 cm low-density lesion in the pancreatic head/body possibly reflecting focal pancreatitis.  She was advised to follow-up with her GI physician by the emergency department.  Her symptoms were not improving and she has also been experiencing nausea and vomiting.  Labs on admission showed a WBC of 16.5, hemoglobin 10.8, platelets 432,000, creatinine 1.63, calcium 8.7, albumin 2.4, lipase 297.  CT abdomen/pelvis without contrast performed on 10/05/2021 showed a persistent at least 2.5 x 3.8 cm hypodense lesion within the region of the pancreatic head/body.  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  She underwent an ultrasound-guided liver biopsy on 10/08/2021.  Biopsy  results currently pending.  The patient reports improvement in her abdominal pain today.  She is not currently having any nausea or vomiting.  She tolerated her lunch.  Prior to admission, she had a decreased appetite and has lost about 10 pounds over the past month.  She has been experiencing constipation.  She has not noticed any melena or hematochezia.  She is not having any headaches, dizziness, chest pain, shortness of breath, edema.  The patient is married and has 2 children.  She quit drinking alcohol in 1999 and quit smoking in 2000.  Family history significant for a mother with rectal cancer.  Medical oncology was asked see the patient make recommendations regarding her pancreatic mass and liver lesions.  Past Medical History:  Diagnosis Date   Abnormal uterine bleeding    Allergy    Anemia    Asthma    Cataract    BOTH EYES   Complete heart block ([place])    a. [employer] PPM 10/2020.   Diabetes ([place])    type II    Diabetic neuropathy ([place])    Endometrial cancer ([place])    GERD (gastroesophageal reflux disease)    Hyperlipidemia    Hypertension    Morbid obesity ([place])    Osteoarthritis    right knee    Pancreatitis    Pneumonia   :   Past Surgical History:  Procedure Laterality Date   CERVICAL BIOPSY     CESAREAN SECTION     x3   COLONOSCOPY WITH PROPOFOL N/A 06/29/2017   Procedure: COLONOSCOPY WITH PROPOFOL;  Surgeon: Irene Shipper, MD;  Location: [facility];  Service: Endoscopy;  Laterality: N/A;   CYSTECTOMY Right 2010   Cyst removed  from right groin.    DILATION AND CURETTAGE OF UTERUS N/A 07/29/2021   Procedure: DILATATION AND CURETTAGE OF UTERUS;  Surgeon: Everitt Amber, MD;  Location: [facility];  Service: Gynecology;  Laterality: N/A;   INTRAUTERINE DEVICE (IUD) INSERTION N/A 07/29/2021   Procedure: INTRAUTERINE DEVICE (IUD) INSERTION;  Surgeon: Everitt Amber, MD;  Location: [facility];  Service: Gynecology;  Laterality: N/A;  IUD FROM MAIN PHARMACY   PACEMAKER IMPLANT N/A  10/11/2020   Procedure: PACEMAKER IMPLANT;  Surgeon: Vickie Epley, MD;  Location: [place] [facility];  Service: Cardiovascular;  Laterality: N/A;   TONSILLECTOMY    :   Current Facility-Administered Medications  Medication Dose Route Frequency Provider Last Rate Last Admin   acetaminophen (TYLENOL) tablet 650 mg  650 mg Oral Q6H PRN Fuller Plan A, MD   650 mg at 10/08/21 1851   Or   acetaminophen (TYLENOL) suppository 650 mg  650 mg Rectal Q6H PRN Fuller Plan A, MD       amLODipine (NORVASC) tablet 10 mg  10 mg Oral QHS Smith, Rondell A, MD   10 mg at 10/08/21 2103   fentaNYL (SUBLIMAZE) injection 25 mcg  25 mcg Intravenous Q2H PRN Fuller Plan A, MD       gabapentin (NEURONTIN) capsule 300 mg  300 mg Oral TID Fuller Plan A, MD   300 mg at 10/09/21 0848   heparin ADULT infusion 100 units/mL (25000 units/276mL)  2,250 Units/hr Intravenous Continuous Jonetta Osgood, MD 22.5 mL/hr at 10/09/21 0904 2,250 Units/hr at 10/09/21 0904   insulin aspart (novoLOG) injection 0-5 Units  0-5 Units Subcutaneous QHS Howerter, Justin B, DO   3 Units at 10/08/21 2055   insulin aspart (novoLOG) injection 0-9 Units  0-9 Units Subcutaneous TID WC Howerter, Justin B, DO   5 Units at 10/09/21 0849   loratadine (CLARITIN) tablet 10 mg  10 mg Oral Daily PRN Fuller Plan A, MD       LORazepam (ATIVAN) injection 0.5 mg  0.5 mg Intravenous Once PRN Fuller Plan A, MD       Muscle Rub CREA   Topical BID PRN Fuller Plan A, MD       ondansetron (ZOFRAN) tablet 4 mg  4 mg Oral Q6H PRN Fuller Plan A, MD   4 mg at 10/07/21 0981   Or   ondansetron (ZOFRAN) injection 4 mg  4 mg Intravenous Q6H PRN Fuller Plan A, MD   4 mg at 10/06/21 1546   oxyCODONE (Oxy IR/ROXICODONE) immediate release tablet 10 mg  10 mg Oral Q6H PRN Jonetta Osgood, MD   10 mg at 10/08/21 2206   pantoprazole (PROTONIX) EC tablet 40 mg  40 mg Oral Q1200 Jonetta Osgood, MD   40 mg at 10/08/21 1236   polyethylene glycol  (MIRALAX / GLYCOLAX) packet 17 g  17 g Oral Daily PRN Fuller Plan A, MD       pravastatin (PRAVACHOL) tablet 40 mg  40 mg Oral q1800 Smith, Rondell A, MD   40 mg at 10/08/21 1806   sodium chloride flush (NS) 0.9 % injection 3 mL  3 mL Intravenous Q12H Smith, Rondell A, MD   3 mL at 10/08/21 2103      Allergies  Allergen Reactions   Penicillins Shortness Of Breath, Itching and Swelling    Has patient had a PCN reaction causing immediate rash, facial/tongue/throat swelling, SOB or lightheadedness with hypotension: Yes Has patient had a PCN reaction causing severe rash involving  mucus membranes or skin necrosis:No Has patient had a PCN reaction that required hospitalization: No Has patient had a PCN reaction occurring within the last 10 years: no If all of the above answers are "NO", then may proceed with Cephalosporin use.    :   Family History  Adopted: Yes  Problem Relation Age of Onset   Diabetes Father    Heart disease Father    Cancer Mother        colon   Colon cancer Neg Hx    Esophageal cancer Neg Hx    Rectal cancer Neg Hx    Stomach cancer Neg Hx   :   Social History   Socioeconomic History   Marital status: Married    Spouse name: Not on file   Number of children: 2   Years of education: Not on file   Highest education level: Not on file  Occupational History   Occupation: Intake Coordinator  Tobacco Use   Smoking status: Former    Types: Cigarettes   Smokeless tobacco: Never   Tobacco comments:    Former smoker (09/18/2021)  Vaping Use   Vaping Use: Never used  Substance and Sexual Activity   Alcohol use: No    Alcohol/week: 0.0 standard drinks   Drug use: No   Sexual activity: Not on file  Other Topics Concern   Not on file  Social History Narrative   Not on file   Social Determinants of Health   Financial Resource Strain: Not on file  Food Insecurity: Not on file  Transportation Needs: Not on file  Physical Activity: Not on file   Stress: Not on file  Social Connections: Not on file  Intimate Partner Violence: Not on file  :  Review of Systems: A comprehensive 14 point review of systems was negative except as noted in the HPI.  Exam: Patient Vitals for the past 24 hrs:  BP Temp Temp src Pulse Resp SpO2  10/09/21 0750 128/65 98.2 F (36.8 C) Oral 83 17 96 %  10/09/21 0434 130/72 97.9 F (36.6 C) Axillary 78 13 95 %  10/09/21 0009 132/72 99.1 F (37.3 C) Oral 81 13 96 %  10/08/21 2055 140/73 -- -- 89 18 98 %  10/08/21 2031 (!) 168/71 98.9 F (37.2 C) Oral (!) 104 18 96 %  10/08/21 1553 139/77 98.9 F (37.2 C) Oral 90 17 100 %  10/08/21 1227 138/71 98.9 F (37.2 C) Oral 84 16 100 %  10/08/21 1200 (!) 144/69 -- -- 88 (!) 21 100 %  10/08/21 1155 (!) 142/73 -- -- 83 15 100 %  10/08/21 1150 140/71 -- -- 83 14 97 %  10/08/21 1130 140/67 -- -- 80 11 100 %    General:  well-nourished in no acute distress.   Eyes:  no scleral icterus.   ENT:  There were no oropharyngeal lesions.    Lymphatics:  Negative cervical, supraclavicular or axillary adenopathy.   Respiratory: lungs were clear bilaterally without wheezing or crackles.   Cardiovascular:  Regular rate and rhythm, S1/S2, without murmur, rub or gallop.  There was no pedal edema.   GI: Positive bowel sounds, soft, tenderness over the epigastric area  Skin exam was without echymosis, petichae.   Neuro exam was nonfocal. Patient was alert and oriented.  Attention was good.   Language was appropriate.  Mood was normal without depression.  Speech was not pressured.  Thought content was not tangential.     Lab Results  Component Value Date   WBC 11.0 (H) 10/09/2021   HGB 8.2 (L) 10/09/2021   HCT 26.6 (L) 10/09/2021   PLT 268 10/09/2021   GLUCOSE 283 (H) 10/09/2021   CHOL 155 12/22/2019   TRIG 116 10/05/2021   HDL 47 12/22/2019   LDLCALC 69 12/22/2019   ALT 9 10/08/2021   AST 12 (L) 10/08/2021   NA 138 10/09/2021   K 4.4 10/09/2021   CL 104 10/09/2021    CREATININE 1.47 (H) 10/09/2021   BUN 8 10/09/2021   CO2 29 10/09/2021    CT ABDOMEN PELVIS WO CONTRAST  Result Date: 10/05/2021 CLINICAL DATA:  Abdominal pain, acute, nonlocalized. R sided rib pain for one week EXAM: CT ABDOMEN AND PELVIS WITHOUT CONTRAST TECHNIQUE: Multidetector CT imaging of the abdomen and pelvis was performed following the standard protocol without IV contrast. COMPARISON:  CT abdomen pelvis 09/25/2021, ultrasound abdomen 09/24/2021 FINDINGS: Lower chest: Cardiac leads partially noted. Hepatobiliary: No focal liver abnormality. No gallstones, gallbladder wall thickening, or pericholecystic fluid. No biliary dilatation. Pancreas: Difficult to visualize persistent at least 2.5 x 3.8 cm hypodense lesion within the region of the pancreatic head/body. Normal pancreatic contour. No surrounding inflammatory changes. No main pancreatic ductal dilatation. Spleen: Normal in size without focal abnormality. Adrenals/Urinary Tract: No adrenal nodule bilaterally. No nephrolithiasis and no hydronephrosis. No definite contour-deforming renal mass. No ureterolithiasis or hydroureter. The urinary bladder is unremarkable. Stomach/Bowel: Stomach is within normal limits. No evidence of bowel wall thickening or dilatation. Scattered colonic diverticulosis. Appendix appears normal. Vascular/Lymphatic: No intraperitoneal free fluid. No intraperitoneal free gas. No organized fluid collection. Reproductive: A t shaped intrauterine device is noted within the uterus in grossly appropriate position. Uterus and bilateral adnexa are unremarkable. Other: No intraperitoneal free fluid. No intraperitoneal free gas. No organized fluid collection. Musculoskeletal: There is a 2 cm fat containing umbilical hernia. No suspicious lytic or blastic osseous lesions. No acute displaced fracture. Grade 1 anterolisthesis of L4 on L5. Multilevel degenerative changes of the spine. IMPRESSION: 1. Difficult to visualize on this  noncontrast study persistent at least 2.5 x 3.8 cm hypodense lesion within the region of the pancreatic head/body. Recommend nonemergent MRI pancreatic protocol further evaluation. When the patient is clinically stable and able to follow directions and hold their breath (preferably as an outpatient) further evaluation with dedicated abdominal MRI should be considered. 2. Scattered colonic diverticulosis with no acute diverticulitis. 3. T-shaped intrauterine device in grossly appropriate position. 4.  Aortic Atherosclerosis (ICD10-I70.0). Electronically Signed   By: Iven Finn M.D.   On: 10/05/2021 15:47   MR ABDOMEN W WO CONTRAST  Result Date: 10/06/2021 CLINICAL DATA:  A 60 year old female with hypertension and hyperlipidemia presents for evaluation of pancreatic lesion versus is pancreatitis. EXAM: MRI ABDOMEN WITHOUT AND WITH CONTRAST TECHNIQUE: Multiplanar multisequence MR imaging of the abdomen was performed both before and after the administration of intravenous contrast. CONTRAST:  87mL GADAVIST GADOBUTROL 1 MMOL/ML IV SOLN COMPARISON:  Comparison made with September 25, 2021. FINDINGS: Lower chest: No effusion, no consolidative process. Hepatobiliary: Lobular hepatic contours. Mild hepatomegaly, small LEFT lobe with 22 cm greatest craniocaudal dimension. No substantial steatosis. Focal hepatic lesion in the LEFT hepatic lobe (image 49/10) this does not meet criteria for a cyst and measures 1.5 cm. Similar lesion in the posterior RIGHT hepatic lobe measuring 1.5 cm (image 8/9) additional lesion in the medial segment of the LEFT hepatic lobe (image 24/9) 9 mm. Additional subtle foci of restricted diffusion in the lateral segment and the  medial segment of the LEFT hepatic lobe, at least 2 additional tiny foci. These areas show subtle hypoenhancement and signs of restricted diffusion. No biliary duct dilation. No pericholecystic stranding. Pancreas: Mass in the pancreatic head measuring 4.9 x 4.9 cm.  Central hypointensity compatible with central necrosis there is loss of the normal main pancreatic duct at this location, only mild ductal dilation is noted. Mass extending anteriorly. Mild infiltration of the soft tissues around vascular structures and distortion of vascular structures, suspect least abutment of the portal vein based on its appearance. Common hepatic artery passes immediately posterior to the mass. Celiac trunk is uninvolved as is SMA as best as can be determined on MRI. Subtle nodularity in the transverse mesocolon. Surrounding nodal enlargement in the celiac with hepatic lesions as described. Lesion does not have a cystic appearance on T2 imaging. Spleen:  Unremarkable. Adrenals/Urinary Tract: Adrenal glands are normal. Symmetric renal enhancement without suspicious renal lesion. Stomach/Bowel: Duodenum and small bowel are inseparable from the mass at the head of the pancreas. Vascular/Lymphatic: Vascular structures and relationships as described above. Nodal disease about the celiac axis, lymph nodes measuring up to a cm adjacent to the pancreas lymph nodes best seen on diffusion-weighted imaging. Supra pancreatic lymph node on image 103/11 and hepatic gastric lymph nodes on image 99/11 also with mild enlargement of portacaval lymph nodes. Other:  No ascites. Musculoskeletal: No suspicious bone lesions identified. 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 Subsequent local staging could be performed with pancreatic protocol CT and EUS as indicated. Local invasion of stomach and duodenum are suspected. Mild stranding about the pancreas could be indicative of mild superimposed pancreatitis or local tumor extension particularly into the transverse mesocolon. These results will be called to the  ordering clinician or representative by the Radiologist Assistant, and communication documented in the PACS or [employer]. Electronically Signed   By: Zetta Bills M.D.   On: 10/06/2021 15:40   CT ABDOMEN PELVIS W CONTRAST  Result Date: 09/25/2021 CLINICAL DATA:  Elevated lipase, acute pancreatitis, abnormal ultrasound EXAM: CT ABDOMEN AND PELVIS WITH CONTRAST TECHNIQUE: Multidetector CT imaging of the abdomen and pelvis was performed using the standard protocol following bolus administration of intravenous contrast. CONTRAST:  22mL OMNIPAQUE IOHEXOL 350 MG/ML SOLN COMPARISON:  Right upper quadrant ultrasound dated 09/24/2021 FINDINGS: Lower chest: Lung bases are clear. Hepatobiliary: Liver is within normal limits. Gallbladder is unremarkable. No intrahepatic or extrahepatic ductal dilatation. Pancreas: 4.1 x 3.7 cm low-density lesion in the pancreatic head/body (series 3/image 31), possibly reflecting focal pancreatitis given the clinical history. No pancreatic ductal dilatation in the pancreatic tail. Spleen: Within normal limits. Adrenals/Urinary Tract: Adrenal glands are within normal limits. Kidneys are within normal limits.  No hydronephrosis. Bladder is within normal limits. Stomach/Bowel: Stomach is within normal limits. No evidence of bowel obstruction. Appendix is not discretely visualized. Sigmoid diverticulosis, without evidence of diverticulitis. Vascular/Lymphatic: No evidence of abdominal aortic aneurysm. Small upper abdominal lymph nodes, including a 13 mm short axis node in the porta hepatis (series 3/image 25), likely reactive. Reproductive: Uterus is notable for an IUD in satisfactory position. No adnexal masses. Other: No abdominopelvic ascites. Small fat containing periumbilical hernia (series 3/image 62). Musculoskeletal: Degenerative changes of the visualized thoracolumbar spine. IMPRESSION: 4.1 cm low-density lesion in the pancreatic head/body, possibly reflecting focal  pancreatitis given the  clinical history. However, this warrants short-term follow-up in 4-6 weeks to document improvement/resolution. Alternatively, EUS could be considered for tissue sampling as clinically warranted. Electronically Signed   By: Julian Hy M.D.   On: 09/25/2021 01:03   US BIOPSY (LIVER)  Result Date: 10/08/2021 INDICATION: Concern for metastatic pancreatic cancer. Please perform ultrasound-guided liver lesion biopsy for tissue diagnostic purposes. EXAM: ULTRASOUND GUIDED LIVER LESION BIOPSY COMPARISON:  CT abdomen pelvis-10/05/2021; abdominal MRI-10/06/2021 MEDICATIONS: None ANESTHESIA/SEDATION: Moderate (conscious) sedation was employed during this procedure as administered by the Interventional Radiology RN. A total of Versed 1 mg and Fentanyl 50 mcg was administered intravenously. Moderate Sedation Time: 14 minutes. The patient's level of consciousness and vital signs were monitored continuously by radiology nursing throughout the procedure under my direct supervision. COMPLICATIONS: None immediate. PROCEDURE: Informed written consent was obtained from the patient after a discussion of the risks, benefits and alternatives to treatment. The patient understands and consents the procedure. A timeout was performed prior to the initiation of the procedure. Ultrasound scanning was performed of the right upper abdominal quadrant demonstrates an approximately 2.1 x 1.5 cm hypoechoic lesion within the caudal subcapsular aspect of the left lobe of the liver (image 3). The procedure was planned. The midline of the abdomen was prepped and draped in the usual sterile fashion. The overlying soft tissues were anesthetized with 1% lidocaine with epinephrine. A 17 gauge, 6.8 cm co-axial needle was advanced into a peripheral aspect of the lesion. This was followed by 5 core biopsies with an 18 gauge core device under direct ultrasound guidance. The coaxial needle tract was embolized with a small amount  of Gel-Foam slurry and superficial hemostasis was obtained with manual compression. Post procedural scanning was negative for definitive area of hemorrhage or additional complication. A dressing was applied. The patient tolerated the procedure well without immediate post procedural complication. IMPRESSION: Technically successful ultrasound guided core needle biopsy of ill-defined hypoechoic lesion within the caudal subcapsular aspect of the left lobe of the liver. Electronically Signed   By: Sandi Mariscal M.D.   On: 10/08/2021 14:58   US Abdomen Limited RUQ (LIVER/GB)  Result Date: 09/24/2021 CLINICAL DATA:  Right upper quadrant abdominal pain EXAM: ULTRASOUND ABDOMEN LIMITED RIGHT UPPER QUADRANT COMPARISON:  None. FINDINGS: Gallbladder: No gallstones or wall thickening visualized. No sonographic Murphy sign noted by sonographer. Common bile duct: Diameter: 4 mm Liver: No focal lesion identified. Within normal limits in parenchymal echogenicity. Portal vein is patent on color Doppler imaging with normal direction of blood flow towards the liver. Other: 4.9 x 4.9 x 5.3 cm lesion adjacent to the pancreatic head. While this may reflect a loop of bowel, a mass is not excluded. IMPRESSION: 5.3 cm lesion adjacent to the pancreatic head. While this may reflect a loop of bowel, a mass is not excluded. Consider CT abdomen with contrast for further evaluation. Electronically Signed   By: Julian Hy M.D.   On: 09/24/2021 23:27     CT ABDOMEN PELVIS WO CONTRAST  Result Date: 10/05/2021 CLINICAL DATA:  Abdominal pain, acute, nonlocalized. R sided rib pain for one week EXAM: CT ABDOMEN AND PELVIS WITHOUT CONTRAST TECHNIQUE: Multidetector CT imaging of the abdomen and pelvis was performed following the standard protocol without IV contrast. COMPARISON:  CT abdomen pelvis 09/25/2021, ultrasound abdomen 09/24/2021 FINDINGS: Lower chest: Cardiac leads partially noted. Hepatobiliary: No focal liver abnormality. No  gallstones, gallbladder wall thickening, or pericholecystic fluid. No biliary dilatation. Pancreas: Difficult to visualize persistent at least 2.5 x 3.8  cm hypodense lesion within the region of the pancreatic head/body. Normal pancreatic contour. No surrounding inflammatory changes. No main pancreatic ductal dilatation. Spleen: Normal in size without focal abnormality. Adrenals/Urinary Tract: No adrenal nodule bilaterally. No nephrolithiasis and no hydronephrosis. No definite contour-deforming renal mass. No ureterolithiasis or hydroureter. The urinary bladder is unremarkable. Stomach/Bowel: Stomach is within normal limits. No evidence of bowel wall thickening or dilatation. Scattered colonic diverticulosis. Appendix appears normal. Vascular/Lymphatic: No intraperitoneal free fluid. No intraperitoneal free gas. No organized fluid collection. Reproductive: A t shaped intrauterine device is noted within the uterus in grossly appropriate position. Uterus and bilateral adnexa are unremarkable. Other: No intraperitoneal free fluid. No intraperitoneal free gas. No organized fluid collection. Musculoskeletal: There is a 2 cm fat containing umbilical hernia. No suspicious lytic or blastic osseous lesions. No acute displaced fracture. Grade 1 anterolisthesis of L4 on L5. Multilevel degenerative changes of the spine. IMPRESSION: 1. Difficult to visualize on this noncontrast study persistent at least 2.5 x 3.8 cm hypodense lesion within the region of the pancreatic head/body. Recommend nonemergent MRI pancreatic protocol further evaluation. When the patient is clinically stable and able to follow directions and hold their breath (preferably as an outpatient) further evaluation with dedicated abdominal MRI should be considered. 2. Scattered colonic diverticulosis with no acute diverticulitis. 3. T-shaped intrauterine device in grossly appropriate position. 4.  Aortic Atherosclerosis (ICD10-I70.0). Electronically Signed   By:  Iven Finn M.D.   On: 10/05/2021 15:47   MR ABDOMEN W WO CONTRAST  Result Date: 10/06/2021 CLINICAL DATA:  A 60 year old female with hypertension and hyperlipidemia presents for evaluation of pancreatic lesion versus is pancreatitis. EXAM: MRI ABDOMEN WITHOUT AND WITH CONTRAST TECHNIQUE: Multiplanar multisequence MR imaging of the abdomen was performed both before and after the administration of intravenous contrast. CONTRAST:  48mL GADAVIST GADOBUTROL 1 MMOL/ML IV SOLN COMPARISON:  Comparison made with September 25, 2021. FINDINGS: Lower chest: No effusion, no consolidative process. Hepatobiliary: Lobular hepatic contours. Mild hepatomegaly, small LEFT lobe with 22 cm greatest craniocaudal dimension. No substantial steatosis. Focal hepatic lesion in the LEFT hepatic lobe (image 49/10) this does not meet criteria for a cyst and measures 1.5 cm. Similar lesion in the posterior RIGHT hepatic lobe measuring 1.5 cm (image 8/9) additional lesion in the medial segment of the LEFT hepatic lobe (image 24/9) 9 mm. Additional subtle foci of restricted diffusion in the lateral segment and the medial segment of the LEFT hepatic lobe, at least 2 additional tiny foci. These areas show subtle hypoenhancement and signs of restricted diffusion. No biliary duct dilation. No pericholecystic stranding. Pancreas: Mass in the pancreatic head measuring 4.9 x 4.9 cm. Central hypointensity compatible with central necrosis there is loss of the normal main pancreatic duct at this location, only mild ductal dilation is noted. Mass extending anteriorly. Mild infiltration of the soft tissues around vascular structures and distortion of vascular structures, suspect least abutment of the portal vein based on its appearance. Common hepatic artery passes immediately posterior to the mass. Celiac trunk is uninvolved as is SMA as best as can be determined on MRI. Subtle nodularity in the transverse mesocolon. Surrounding nodal enlargement in  the celiac with hepatic lesions as described. Lesion does not have a cystic appearance on T2 imaging. Spleen:  Unremarkable. Adrenals/Urinary Tract: Adrenal glands are normal. Symmetric renal enhancement without suspicious renal lesion. Stomach/Bowel: Duodenum and small bowel are inseparable from the mass at the head of the pancreas. Vascular/Lymphatic: Vascular structures and relationships as described above. Nodal disease about  the celiac axis, lymph nodes measuring up to a cm adjacent to the pancreas lymph nodes best seen on diffusion-weighted imaging. Supra pancreatic lymph node on image 103/11 and hepatic gastric lymph nodes on image 99/11 also with mild enlargement of portacaval lymph nodes. Other:  No ascites. Musculoskeletal: No suspicious bone lesions identified. 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 Subsequent local staging could be performed with pancreatic protocol CT and EUS as indicated. Local invasion of stomach and duodenum are suspected. Mild stranding about the pancreas could be indicative of mild superimposed pancreatitis or local tumor extension particularly into the transverse mesocolon. These results will be called to the ordering clinician or representative by the Radiologist Assistant, and communication documented in the PACS or [employer]. Electronically Signed   By: Zetta Bills M.D.   On: 10/06/2021 15:40   CT ABDOMEN PELVIS W CONTRAST  Result Date: 09/25/2021 CLINICAL DATA:  Elevated lipase, acute pancreatitis, abnormal ultrasound EXAM: CT ABDOMEN AND PELVIS WITH CONTRAST TECHNIQUE: Multidetector CT imaging of the abdomen and pelvis was performed using the standard protocol following bolus administration of intravenous contrast. CONTRAST:  59mL OMNIPAQUE IOHEXOL 350 MG/ML  SOLN COMPARISON:  Right upper quadrant ultrasound dated 09/24/2021 FINDINGS: Lower chest: Lung bases are clear. Hepatobiliary: Liver is within normal limits. Gallbladder is unremarkable. No intrahepatic or extrahepatic ductal dilatation. Pancreas: 4.1 x 3.7 cm low-density lesion in the pancreatic head/body (series 3/image 31), possibly reflecting focal pancreatitis given the clinical history. No pancreatic ductal dilatation in the pancreatic tail. Spleen: Within normal limits. Adrenals/Urinary Tract: Adrenal glands are within normal limits. Kidneys are within normal limits.  No hydronephrosis. Bladder is within normal limits. Stomach/Bowel: Stomach is within normal limits. No evidence of bowel obstruction. Appendix is not discretely visualized. Sigmoid diverticulosis, without evidence of diverticulitis. Vascular/Lymphatic: No evidence of abdominal aortic aneurysm. Small upper abdominal lymph nodes, including a 13 mm short axis node in the porta hepatis (series 3/image 25), likely reactive. Reproductive: Uterus is notable for an IUD in satisfactory position. No adnexal masses. Other: No abdominopelvic ascites. Small fat containing periumbilical hernia (series 3/image 62). Musculoskeletal: Degenerative changes of the visualized thoracolumbar spine. IMPRESSION: 4.1 cm low-density lesion in the pancreatic head/body, possibly reflecting focal pancreatitis given the clinical history. However, this warrants short-term follow-up in 4-6 weeks to document improvement/resolution. Alternatively, EUS could be considered for tissue sampling as clinically warranted. Electronically Signed   By: Julian Hy M.D.   On: 09/25/2021 01:03   US BIOPSY (LIVER)  Result Date: 10/08/2021 INDICATION: Concern for metastatic pancreatic cancer. Please perform ultrasound-guided liver lesion biopsy for tissue diagnostic purposes. EXAM: ULTRASOUND GUIDED LIVER LESION BIOPSY COMPARISON:  CT abdomen pelvis-10/05/2021; abdominal  MRI-10/06/2021 MEDICATIONS: None ANESTHESIA/SEDATION: Moderate (conscious) sedation was employed during this procedure as administered by the Interventional Radiology RN. A total of Versed 1 mg and Fentanyl 50 mcg was administered intravenously. Moderate Sedation Time: 14 minutes. The patient's level of consciousness and vital signs were monitored continuously by radiology nursing throughout the procedure under my direct supervision. COMPLICATIONS: None immediate. PROCEDURE: Informed written consent was obtained from the patient after a discussion of the risks, benefits and alternatives to treatment. The patient understands and consents the procedure. A timeout was performed prior to the initiation of the procedure. Ultrasound scanning was performed of the right upper abdominal quadrant demonstrates an approximately 2.1  x 1.5 cm hypoechoic lesion within the caudal subcapsular aspect of the left lobe of the liver (image 3). The procedure was planned. The midline of the abdomen was prepped and draped in the usual sterile fashion. The overlying soft tissues were anesthetized with 1% lidocaine with epinephrine. A 17 gauge, 6.8 cm co-axial needle was advanced into a peripheral aspect of the lesion. This was followed by 5 core biopsies with an 18 gauge core device under direct ultrasound guidance. The coaxial needle tract was embolized with a small amount of Gel-Foam slurry and superficial hemostasis was obtained with manual compression. Post procedural scanning was negative for definitive area of hemorrhage or additional complication. A dressing was applied. The patient tolerated the procedure well without immediate post procedural complication. IMPRESSION: Technically successful ultrasound guided core needle biopsy of ill-defined hypoechoic lesion within the caudal subcapsular aspect of the left lobe of the liver. Electronically Signed   By: Sandi Mariscal M.D.   On: 10/08/2021 14:58   US Abdomen Limited RUQ  (LIVER/GB)  Result Date: 09/24/2021 CLINICAL DATA:  Right upper quadrant abdominal pain EXAM: ULTRASOUND ABDOMEN LIMITED RIGHT UPPER QUADRANT COMPARISON:  None. FINDINGS: Gallbladder: No gallstones or wall thickening visualized. No sonographic Murphy sign noted by sonographer. Common bile duct: Diameter: 4 mm Liver: No focal lesion identified. Within normal limits in parenchymal echogenicity. Portal vein is patent on color Doppler imaging with normal direction of blood flow towards the liver. Other: 4.9 x 4.9 x 5.3 cm lesion adjacent to the pancreatic head. While this may reflect a loop of bowel, a mass is not excluded. IMPRESSION: 5.3 cm lesion adjacent to the pancreatic head. While this may reflect a loop of bowel, a mass is not excluded. Consider CT abdomen with contrast for further evaluation. Electronically Signed   By: Julian Hy M.D.   On: 09/24/2021 23:27    Assessment and Plan:  1.  Pancreatic mass with liver lesions 2.  Possible pancreatitis 3.  Leukocytosis, improving 4.  Normocytic anemia 5.  AKI on CKD, improving 6.  PAF 7.  History of complete heart block status post PPM placement 9.  Diabetes mellitus 10.  History of colon polyps 11.  GERD 12.  Morbid obesity 13.  Hypertension  -Scans reviewed.  Imaging findings concerning for pancreatic malignancy.  Status post ultrasound-guided liver biopsy 10/08/2021 and results are currently pending.  Discussed with the patient that we will follow-up on the biopsy results and have further discussion regarding treatment options once these results are available to [place]. -Recommend additional work-up including a CT of the chest and CA 19.9.  I do note that the patient is in the process of being discharged today.  We can make arrangements to obtain additional testing as an outpatient. -If proven to have a pancreatic malignancy, she will need genetic testing. -We will closely monitor hemoglobin as an outpatient.  Recommend additional work-up  including ferritin, iron studies, vitamin B12 level, folate, and reticulocytes.   Thank you for this referral.   Mikey Bussing, DNP, AGPCNP-BC, AOCNP   Addendum  I have seen the patient, examined her. I agree with the assessment and and plan and have edited the notes.   60 yo female with PMh of DM, hypertension, atrial fibrillation and this, complete heart block status post pacemaker in presented with intermittent abdominal pain for 1 month.  I have personally reviewed her CT scan and MRI which is highly concerning for metastatic pancreatic cancer to liver.  She has undergone liver biopsy for  tissue diagnosis, results still pending.  I reviewed his imaging findings and discussed the presumed diagnosis, and treatment options with her today.  She is ready to be discharged home after my visit.  I will set up her follow-up with me next week in my office, with CT chest to complete staging.  All questions were answered.  If biopsy confirmed pancreatic cancer, I will also arrange genetic consultation and testing.  She had history of endometrial cancer.all questions were answered.   Truitt Merle  10/09/2021

## 2021-10-09 NOTE — Progress Notes (Signed)
Discharge paperwork reviewed with pt. Pt verbalized understanding. Medications from [facility] has delivered to pt. Now awaiting for oncologist to come speak with pt before pt is discharged.

## 2021-10-09 NOTE — Progress Notes (Signed)
Dr. Burr Medico has spoken with pt. Pt now awaiting for transport from a family member.

## 2021-10-09 NOTE — Progress Notes (Signed)
Pt taken downstairs to main entrance via w/c by NT. Pt has taken belongings with her. Pt's daughter awaiting downstairs to transport pt home.

## 2021-10-09 NOTE — Progress Notes (Signed)
[facility] night cross cover note:  I was notified by RN that patient exhibited a short run of atrial fibrillation with RVR versus SVT with associated max HR of 147 bpm during that short run, before spontaneous conversion back to sinus rhythm with ensuing heart rates in the 80s.  Patient completely asymptomatic during the above. Has h/o paroxysmal atrial fibrillation, with recent resumption of Heparin drip for associated anticoagulation following recent liver biopsy. Existing CBC order for the AM noted, including for trending Hgb given presenting mild acute anemia. I have also ordered BMP and serum magnesium to be checked in the a.m.    Babs Bertin, DO Hospitalist

## 2021-10-09 NOTE — Progress Notes (Signed)
Asked pt if oncologist had came by to speak with pt. Pt stated oncology NP had been by to speak with her but NP told pt that oncologist Burr Medico MD wanted to still speak with pt before discharging.

## 2021-10-09 NOTE — Progress Notes (Signed)
Inpatient Diabetes Program Recommendations  AACE/ADA: New Consensus Statement on Inpatient Glycemic Control   Target Ranges:  Prepandial:   less than 140 mg/dL      Peak postprandial:   less than 180 mg/dL (1-2 hours)      Critically ill patients:  140 - 180 mg/dL   Results for DASANI, THURLOW (MRN [id number]) as of 10/09/2021 11:01  Ref. Range 10/08/2021 07:58 10/08/2021 12:26 10/08/2021 15:52 10/08/2021 20:35 10/09/2021 07:53  Glucose-Capillary Latest Ref Range: 70 - 99 mg/dL 166 (H) 167 (H) 238 (H) 298 (H) 255 (H)    Review of Glycemic Control  Diabetes history: DM2 Outpatient Diabetes medications: Tresiba 80 units QPM, Synjardy XR 12.04-999 mg BID Current orders for Inpatient glycemic control: Novolog 0-9 units TID, Novolog 0-5 units QHS   Inpatient Diabetes Program Recommendations:     Insulin: Please consider ordering Semglee 10 units Q24H.   Thanks, Barnie Alderman, RN, MSN, CDE Diabetes Coordinator Inpatient Diabetes Program [phone] (Team Pager from 8am to 5pm)

## 2021-10-09 NOTE — Progress Notes (Signed)
[place] for Heparin Indication: atrial fibrillation  Allergies  Allergen Reactions   Penicillins Shortness Of Breath, Itching and Swelling    Has patient had a PCN reaction causing immediate rash, facial/tongue/throat swelling, SOB or lightheadedness with hypotension: Yes Has patient had a PCN reaction causing severe rash involving mucus membranes or skin necrosis:No Has patient had a PCN reaction that required hospitalization: No Has patient had a PCN reaction occurring within the last 10 years: no If all of the above answers are "NO", then may proceed with Cephalosporin use.      Patient Measurements: Height: 5\' 8"  (172.7 cm) Weight: (!) 152.7 kg (336 lb 10.3 oz) IBW/kg (Calculated) : 63.9  Heparin Dosing Weight: 101.7 kg  Vital Signs: Temp: 99.1 F (37.3 C) (11/03 0009) Temp Source: Oral (11/03 0009) BP: 132/72 (11/03 0009) Pulse Rate: 81 (11/03 0009)  Labs: Recent Labs    10/06/21 1637 10/06/21 1637 10/07/21 0219 10/07/21 0907 10/07/21 1816 10/07/21 1827 10/08/21 0254 10/08/21 1025 10/09/21 0158  HGB  --    < > 9.2*  --   --   --  7.7*  --  8.2*  HCT  --   --  29.9*  --   --   --  25.3*  --  26.6*  PLT  --   --  304  --   --   --  273  --  268  APTT 27  --  49*   < >  --    < > 88* 50* 64*  LABPROT 18.0*  --   --   --   --   --   --   --   --   INR 1.5*  --   --   --   --   --   --   --   --   HEPARINUNFRC >1.10*  --  >1.10*  --  >1.10*  --   --   --  0.59  CREATININE  --   --  1.43*  --   --   --  1.45*  --   --    < > = values in this interval not displayed.     Estimated Creatinine Clearance: 65.6 mL/min (A) (by C-G formula based on SCr of 1.45 mg/dL (H)).  Assessment: 60 y.o. female with h/o Afib, Eliquis on hold for biopsy, for heparin   Goal of Therapy:  Heparin level 0.3-0.7 units/ml APTT level: 66-102 Monitor platelets by anticoagulation protocol: Yes  Plan:  Increase Heparin 2250 units/hr  Phillis Knack, PharmD, BCPS

## 2021-10-10 ENCOUNTER — Ambulatory Visit (INDEPENDENT_AMBULATORY_CARE_PROVIDER_SITE_OTHER): Payer: 59

## 2021-10-10 ENCOUNTER — Encounter: Payer: Self-pay | Admitting: Hematology

## 2021-10-10 ENCOUNTER — Other Ambulatory Visit: Payer: Self-pay | Admitting: Oncology

## 2021-10-10 ENCOUNTER — Other Ambulatory Visit: Payer: Self-pay

## 2021-10-10 ENCOUNTER — Telehealth: Payer: Self-pay | Admitting: Hematology

## 2021-10-10 DIAGNOSIS — I442 Atrioventricular block, complete: Secondary | ICD-10-CM | POA: Diagnosis not present

## 2021-10-10 DIAGNOSIS — K8689 Other specified diseases of pancreas: Secondary | ICD-10-CM

## 2021-10-10 LAB — CUP PACEART REMOTE DEVICE CHECK
Battery Remaining Longevity: 174 mo
Battery Voltage: 3.14 V
Brady Statistic AP VP Percent: 0 %
Brady Statistic AP VS Percent: 0.05 %
Brady Statistic AS VP Percent: 0.03 %
Brady Statistic AS VS Percent: 99.92 %
Brady Statistic RA Percent Paced: 0.06 %
Brady Statistic RV Percent Paced: 0.03 %
Date Time Interrogation Session: 20221103202627
Implantable Lead Implant Date: 20211105
Implantable Lead Implant Date: 20211105
Implantable Lead Location: 753859
Implantable Lead Location: 753860
Implantable Lead Model: 5076
Implantable Lead Model: 5076
Implantable Pulse Generator Implant Date: 20211105
Lead Channel Impedance Value: 285 Ohm
Lead Channel Impedance Value: 285 Ohm
Lead Channel Impedance Value: 342 Ohm
Lead Channel Impedance Value: 513 Ohm
Lead Channel Pacing Threshold Amplitude: 1 V
Lead Channel Pacing Threshold Amplitude: 1 V
Lead Channel Pacing Threshold Pulse Width: 0.4 ms
Lead Channel Pacing Threshold Pulse Width: 0.4 ms
Lead Channel Sensing Intrinsic Amplitude: 2.125 mV
Lead Channel Sensing Intrinsic Amplitude: 2.125 mV
Lead Channel Sensing Intrinsic Amplitude: 3.5 mV
Lead Channel Sensing Intrinsic Amplitude: 3.5 mV
Lead Channel Setting Pacing Amplitude: 2 V
Lead Channel Setting Pacing Amplitude: 2.5 V
Lead Channel Setting Pacing Pulse Width: 0.4 ms
Lead Channel Setting Sensing Sensitivity: 1.2 mV

## 2021-10-10 MED ORDER — PROCHLORPERAZINE MALEATE 10 MG PO TABS: 10.0000 mg | ORAL_TABLET | Freq: Four times a day (QID) | ORAL | 1 refills | Status: DC | PRN

## 2021-10-10 NOTE — Telephone Encounter (Signed)
Scheduled new patient appt per 11/3 staff msg from Dr. Burr Medico. Called pt, no answer. Left msg with appt date and time as well as location. I also left my direct number and asked pt to call me back to confirm appt.

## 2021-10-10 NOTE — Telephone Encounter (Signed)
Order placed for 10mg  compazine q6hrs PRN nausea per Dr.Feng. pt messaged back making her aware.

## 2021-10-13 NOTE — Progress Notes (Signed)
I spoke with Leslie Mullen.  I explained my role as a Art therapist.  I reviewed the CT chest appt date and time.  She is able to come to that appt.  I moved her appt with Dr Burr Medico to 1220.  She is aware of our location.  I let her know that we have free valet parking for our patients.  All questions were answered.  She verbalized understanding.

## 2021-10-14 ENCOUNTER — Ambulatory Visit (HOSPITAL_COMMUNITY)
Admission: RE | Admit: 2021-10-14 | Discharge: 2021-10-14 | Disposition: A | Discharge status: Home / Self Care | Payer: 59 | Source: Ambulatory Visit | Attending: Oncology | Admitting: Oncology

## 2021-10-14 ENCOUNTER — Telehealth: Payer: Self-pay | Admitting: Hematology

## 2021-10-14 ENCOUNTER — Inpatient Hospital Stay: Payer: 59 | Attending: Gynecologic Oncology | Admitting: Hematology

## 2021-10-14 ENCOUNTER — Other Ambulatory Visit: Payer: Self-pay

## 2021-10-14 ENCOUNTER — Encounter: Payer: Self-pay | Admitting: Hematology

## 2021-10-14 VITALS — BP 165/72 | HR 85 | Temp 98.4°F | Resp 18 | Ht 68.0 in | Wt 330.9 lb

## 2021-10-14 DIAGNOSIS — C787 Secondary malignant neoplasm of liver and intrahepatic bile duct: Secondary | ICD-10-CM | POA: Insufficient documentation

## 2021-10-14 DIAGNOSIS — K8689 Other specified diseases of pancreas: Secondary | ICD-10-CM | POA: Diagnosis present

## 2021-10-14 DIAGNOSIS — C259 Malignant neoplasm of pancreas, unspecified: Secondary | ICD-10-CM | POA: Insufficient documentation

## 2021-10-14 DIAGNOSIS — Z5111 Encounter for antineoplastic chemotherapy: Secondary | ICD-10-CM | POA: Insufficient documentation

## 2021-10-14 DIAGNOSIS — C250 Malignant neoplasm of head of pancreas: Secondary | ICD-10-CM | POA: Insufficient documentation

## 2021-10-14 DIAGNOSIS — Z79899 Other long term (current) drug therapy: Secondary | ICD-10-CM | POA: Insufficient documentation

## 2021-10-14 MED ORDER — ONDANSETRON HCL 8 MG PO TABS: 8.0000 mg | ORAL_TABLET | Freq: Two times a day (BID) | ORAL | 1 refills | Status: DC | PRN

## 2021-10-14 MED ORDER — OXYCODONE HCL 10 MG PO TABS: 10.0000 mg | ORAL_TABLET | Freq: Four times a day (QID) | ORAL | 0 refills | Status: DC | PRN

## 2021-10-14 MED ORDER — PROCHLORPERAZINE MALEATE 10 MG PO TABS: 10.0000 mg | ORAL_TABLET | Freq: Four times a day (QID) | ORAL | 1 refills | Status: DC | PRN

## 2021-10-14 MED ORDER — LIDOCAINE-PRILOCAINE 2.5-2.5 % EX CREA: TOPICAL_CREAM | CUTANEOUS | 3 refills | Status: DC

## 2021-10-14 MED ORDER — MIRTAZAPINE 7.5 MG PO TABS: 7.5000 mg | ORAL_TABLET | Freq: Every day | ORAL | 0 refills | Status: DC

## 2021-10-14 NOTE — Telephone Encounter (Signed)
Scheduled per sch msg. Called and left msg  

## 2021-10-14 NOTE — Progress Notes (Addendum)
[place]   [phone] [phone]   Clinic Follow up Note   Patient Care Team: Elinor Parkinson as PCP - General (Physician Assistant) Vickie Epley, MD as PCP - Electrophysiology (Cardiology) Josue Hector, MD as PCP - Cardiology (Cardiology)  Date of Service:  10/14/2021  CHIEF COMPLAINT: f/u of metastatic pancreatic cancer  CURRENT THERAPY:  To start gemcitabine/abraxane  ASSESSMENT & PLAN:  Leslie Mullen is a 60 y.o. female with   1. Pancreatic cancer metastasized to liver, stage IV  -initially presented with abdominal/epigastric pain. [place] was performed at her first ED visit on 09/24/21 showing a 5.3 cm lesion adjacent to pancreatic head. Follow up CT AP showed 4.1 cm lesion in pancreatic head. She was recommended to f/u with GI. -she returned to ED on 10/05/21 and was admitted. Abd MRI showed findings of pancreatic neoplasm with hepatic metastatic disease, local nodal disease likely, local invasion of stomach and duodenum suspected, and mild stranding about pancreas. -liver biopsy was performed 10/08/21 and confirmed poorly differentiated carcinoma, compatible with a pancreatobiliary primary. I reviewed with pt and her daughter today  -she had staging chest CT earlier today, 10/14/21. Results are pending. I reviewed her images and do not see metastatic disease in chest  -I reviewed the aggressive nature course of metastatic pancreatic cancer and overall poor prognosis, with median survival 3 to 6 months without treatment. -I discussed  systemic treatment options, which include but not limited to, FOLFIRINOX, gemcitabine and Abraxane, single agent gemcitabine. I reviewed the side effects, response rate and chemo schedule for different regimens.  Also she is 60 year old, she has multiple comorbidities, and overall low performance status, she is not a great candidate for FOLFIRINOX.  After a lengthy discussion, the patient would like to  proceed with gemcitabine and abraxane. --Chemotherapy consent: Side effects including but does not not limited to, fatigue, nausea, vomiting, diarrhea, hair loss, neuropathy, fluid retention, renal and kidney dysfunction, neutropenic fever, needed for blood transfusion, bleeding, were discussed with patient in great detail. She agrees to proceed. -The goal of chemotherapy is palliative, to prolong her life and improve her quality of life -We will plan to proceed with chemo education and begin treatment next Thursday, 10/23/21. -Port placement by IR -genetic testing to see if she is a candidate for PARP inhibitor   2. Goal of care discussion, Social support -We discussed the incurable nature of her cancer, and the overall poor prognosis, especially if she does not have good response to chemotherapy or progress on chemo -The patient understands the goal of care is palliative. -she is full code now  -I will refer them to our social worker, she previously applied for disability but was denied, she would like to appeal.  3. Symptom Management: Nausea, Loss of appetite, Epigastric pain, Fatigue -she is on compazine for nausea and oxycodone for pain. I refilled her oxycodone today. -she reports loss of appetite. I will prescribe mirtazapine for her today. I will also refer her to nutritionist. -we will check her iron levels at her next visit.   PLAN: -I prescribed mirtazapine to increase her appetite -chemo education to be done in the next week -referrals to nutrition and social work -lab and gem/abraxane on 11/17 and 11/25 -phone visit 11/22 for toxicity checkup -IR port placement in next 2 weeks  -genetic referral    No problem-specific Assessment & Plan notes found for this encounter.   SUMMARY OF ONCOLOGIC HISTORY: Oncology History Overview Note  Cancer Staging Pancreatic cancer metastasized to liver [facility]) Staging form: Exocrine Pancreas, AJCC 8th Edition - Clinical stage from  10/14/2021: Stage IV (cT3, cN1, pM1) - Signed by Truitt Merle, MD on 10/14/2021    Pancreatic cancer metastasized to liver ([place])  09/24/2021 Imaging   EXAM: ULTRASOUND ABDOMEN LIMITED RIGHT UPPER QUADRANT  IMPRESSION: 5.3 cm lesion adjacent to the pancreatic head. While this may reflect a loop of bowel, a mass is not excluded. Consider CT abdomen with contrast for further evaluation.   09/25/2021 Imaging   EXAM: CT ABDOMEN AND PELVIS WITH CONTRAST  IMPRESSION: 4.1 cm low-density lesion in the pancreatic head/body, possibly reflecting focal pancreatitis given the clinical history. However, this warrants short-term follow-up in 4-6 weeks to document improvement/resolution. Alternatively, EUS could be considered for tissue sampling as clinically warranted.   10/06/2021 Imaging   EXAM: MRI ABDOMEN WITHOUT AND WITH CONTRAST  IMPRESSION: Findings of pancreatic neoplasm with hepatic metastatic disease, local nodal disease likely with greater than 180 degree involvement of the portal vein, sampling of LEFT hepatic lobe lesion may be helpful to initiate further workup. Subsequent local staging could be performed with pancreatic protocol CT and EUS as indicated.   Local invasion of stomach and duodenum are suspected.   Mild stranding about the pancreas could be indicative of mild superimposed pancreatitis or local tumor extension particularly into the transverse mesocolon.   10/08/2021 Pathology Results   FINAL MICROSCOPIC DIAGNOSIS:   A. LIVER,  LEFT LOBE, BIOPSY:  -  Poorly differentiated carcinoma  -  See comment   COMMENT:  By immunohistochemistry, the neoplastic cells are positive for  cytokeratin 7 and weakly positive for HepPar.  The cells are negative for cytokeratin 20, GATA3, arginase, PAX8, CDX2 and TTF-1.  The immune O profile is nonspecific; however, the findings are compatible with a pancreatobiliary primary in the absence of other lesions.   10/14/2021 Initial  Diagnosis   Pancreatic cancer metastasized to liver [facility])   10/14/2021 Cancer Staging   Staging form: Exocrine Pancreas, AJCC 8th Edition - Clinical stage from 10/14/2021: Stage IV (cT3, cN1, pM1) - Signed by Truitt Merle, MD on 10/14/2021 Stage prefix: Initial diagnosis       INTERVAL HISTORY:  Leslie Mullen is here for a follow up of metastatic pancreatic cancer. She was last seen by me on 10/09/21 while she was in the hospital. She presents to the clinic accompanied by her daughter. She reports she has been "fair" since discharge-- "some good days, some bad." She endorses taking oxycodone TID and antiemetics. She reports she had a bowel movement yesterday with fresh blood. She notes the stool was hard but normal in color.   All other systems were reviewed with the patient and are negative.  MEDICAL HISTORY:  Past Medical History:  Diagnosis Date   Abnormal uterine bleeding    Allergy    Anemia    Asthma    Cataract    BOTH EYES   Complete heart block ([place])    a. [employer] PPM 10/2020.   Diabetes ([place])    type II    Diabetic neuropathy ([facility])    Endometrial cancer ([place])    GERD (gastroesophageal reflux disease)    Hyperlipidemia    Hypertension    Morbid obesity ([place])    Osteoarthritis    right knee    Pancreatitis    Pneumonia     SURGICAL HISTORY: Past Surgical History:  Procedure Laterality Date   CERVICAL BIOPSY     CESAREAN SECTION  x3   COLONOSCOPY WITH PROPOFOL N/A 06/29/2017   Procedure: COLONOSCOPY WITH PROPOFOL;  Surgeon: Irene Shipper, MD;  Location: [facility] END[facility];  Service: Endoscopy;  Laterality: N/A;   CYSTECTOMY Right 2010   Cyst removed from right groin.    DILATION AND CURETTAGE OF UTERUS N/A 07/29/2021   Procedure: DILATATION AND CURETTAGE OF UTERUS;  Surgeon: Everitt Amber, MD;  Location: [facility] ORS;  Service: Gynecology;  Laterality: N/A;   INTRAUTERINE DEVICE (IUD) INSERTION N/A 07/29/2021   Procedure: INTRAUTERINE DEVICE (IUD) INSERTION;  Surgeon:  Everitt Amber, MD;  Location: [facility] ORS;  Service: Gynecology;  Laterality: N/A;  IUD FROM MAIN PHARMACY   PACEMAKER IMPLANT N/A 10/11/2020   Procedure: PACEMAKER IMPLANT;  Surgeon: Vickie Epley, MD;  Location: [place] CV LAB;  Service: Cardiovascular;  Laterality: N/A;   TONSILLECTOMY      I have reviewed the social history and family history with the patient and they are unchanged from previous note.  ALLERGIES:  is allergic to penicillins.  MEDICATIONS:  Current Outpatient Medications  Medication Sig Dispense Refill   mirtazapine (REMERON) 7.5 MG tablet Take 1 tablet (7.5 mg total) by mouth at bedtime. 30 tablet 0   amLODipine (NORVASC) 10 MG tablet Take 1 tablet (10 mg total) by mouth at bedtime. 30 tablet 0   apixaban (ELIQUIS) 5 MG TABS tablet Take 1 tablet (5 mg total) by mouth 2 (two) times daily. 60 tablet 3   gabapentin (NEURONTIN) 300 MG capsule Take 1 capsule (300 mg total) by mouth 2 (two) times daily. (Patient taking differently: Take 300 mg by mouth 3 (three) times daily.)     glucose blood test strip      insulin degludec (TRESIBA FLEXTOUCH) 200 UNIT/ML FlexTouch Pen Inject 20 Units into the skin every evening. Start at 20 units daily-if you blood glucose levels are still on the higher side-slowly increase back to your usual regimen of 80 units daily.     Insulin Pen Needle 31G X 5 MM MISC      loratadine (CLARITIN) 10 MG tablet Take 10 mg by mouth daily as needed for allergies.     omeprazole (PRILOSEC) 40 MG capsule Take 40 mg by mouth 2 (two) times daily.     ondansetron (ZOFRAN ODT) 4 MG disintegrating tablet Take 1 tablet (4 mg total) by mouth every 8 (eight) hours as needed for nausea or vomiting. 20 tablet 0   ONETOUCH DELICA LANCETS 95M MISC      Oxycodone HCl 10 MG TABS Take 1 tablet (10 mg total) by mouth every 6 (six) hours as needed. 90 tablet 0   polyethylene glycol (MIRALAX / GLYCOLAX) 17 g packet Take 17 g by mouth daily as needed for moderate constipation.      pravastatin (PRAVACHOL) 40 MG tablet TAKE 1 TABLET(40 MG) BY MOUTH DAILY 90 tablet 1   prochlorperazine (COMPAZINE) 10 MG tablet Take 1 tablet (10 mg total) by mouth every 6 (six) hours as needed for nausea or vomiting. 30 tablet 1   SYNJARDY XR 12.04-999 MG TB24 Take 1 tablet by mouth 2 (two) times daily.     trolamine salicylate (ASPERCREME) 10 % cream Apply 1 application topically 2 (two) times daily as needed for muscle pain.     No current facility-administered medications for this visit.    PHYSICAL EXAMINATION: ECOG PERFORMANCE STATUS: 3 - Symptomatic, >50% confined to bed  Vitals:   10/14/21 1140  BP: (!) 165/72  Pulse: 85  Resp: 18  Temp: 98.4 F (36.9 C)  SpO2: 97%   Wt Readings from Last 3 Encounters:  10/14/21 (!) 330 lb 14.4 oz (150.1 kg)  10/05/21 (!) 336 lb 10.3 oz (152.7 kg)  09/18/21 (!) 350 lb (158.8 kg)    GENERAL:alert, no distress and comfortable SKIN: skin color normal, no rashes or significant lesions EYES: normal, Conjunctiva are pink and non-injected, sclera clear  NEURO: alert & oriented x 3 with fluent speech  LABORATORY DATA:  I have reviewed the data as listed CBC Latest Ref Rng & Units 10/09/2021 10/08/2021 10/07/2021  WBC 4.0 - 10.5 K/uL 11.0(H) 11.3(H) 13.7(H)  Hemoglobin 12.0 - 15.0 g/dL 8.2(L) 7.7(L) 9.2(L)  Hematocrit 36.0 - 46.0 % 26.6(L) 25.3(L) 29.9(L)  Platelets 150 - 400 K/uL 268 273 304     CMP Latest Ref Rng & Units 10/09/2021 10/08/2021 10/07/2021  Glucose 70 - 99 mg/dL 283(H) 200(H) 100(H)  BUN 6 - 20 mg/dL 8 9 9   Creatinine 0.44 - 1.00 mg/dL 1.47(H) 1.45(H) 1.43(H)  Sodium 135 - 145 mmol/L 138 135 137  Potassium 3.5 - 5.1 mmol/L 4.4 4.2 4.1  Chloride 98 - 111 mmol/L 104 102 104  CO2 22 - 32 mmol/L 29 28 27   Calcium 8.9 - 10.3 mg/dL 8.1(L) 7.9(L) 8.4(L)  Total Protein 6.5 - 8.1 g/dL - 5.7(L) 6.4(L)  Total Bilirubin 0.3 - 1.2 mg/dL - <0.1(L) 0.4  Alkaline Phos 38 - 126 U/L - 55 58  AST 15 - 41 U/L - 12(L) 14(L)  ALT 0 -  44 U/L - 9 10      RADIOGRAPHIC STUDIES: I have personally reviewed the radiological images as listed and agreed with the findings in the report. No results found.    Orders Placed This Encounter  Procedures   IR IMAGING GUIDED PORT INSERTION    Standing Status:   Future    Standing Expiration Date:   10/14/2022    Order Specific Question:   Reason for Exam (SYMPTOM  OR DIAGNOSIS REQUIRED)    Answer:   chemotherapy    Order Specific Question:   Is the patient pregnant?    Answer:   No    Order Specific Question:   Preferred Imaging Location?    Answer:   [facility]   CBC with Differential ([place] Only)    Standing Status:   Standing    Number of Occurrences:   20    Standing Expiration Date:   10/14/2022   CMP ([place] only)    Standing Status:   Standing    Number of Occurrences:   20    Standing Expiration Date:   10/14/2022   Cancer antigen 19-9    Standing Status:   Standing    Number of Occurrences:   20    Standing Expiration Date:   10/14/2022   Ambulatory Referral to [facility] Nutrition    Referral Priority:   Urgent    Referral Type:   Consultation    Referral Reason:   Specialty Services Required    Number of Visits Requested:   1   Ambulatory referral to Social Work    Referral Priority:   Routine    Referral Type:   Consultation    Referral Reason:   Specialty Services Required    Number of Visits Requested:   1   Ambulatory referral to Genetics    Referral Priority:   Routine    Referral Type:   Consultation    Referral Reason:  Specialty Services Required    Number of Visits Requested:   1    All questions were answered. The patient knows to call the clinic with any problems, questions or concerns. No barriers to learning was detected. The total time spent in the appointment was 45 minutes.     Truitt Merle, MD 10/14/2021   I, Wilburn Mylar, am acting as scribe for Truitt Merle, MD.   I have reviewed the above documentation for  accuracy and completeness, and I agree with the above.

## 2021-10-14 NOTE — Addendum Note (Signed)
Addended by: Truitt Merle on: 10/14/2021 10:22 PM   Modules accepted: Orders

## 2021-10-14 NOTE — Progress Notes (Signed)
START ON PATHWAY REGIMEN - Pancreatic Adenocarcinoma ° ° °  A cycle is every 28 days: °    Nab-paclitaxel (protein bound)  °    Gemcitabine  ° °**Always confirm dose/schedule in your pharmacy ordering system** ° °Patient Characteristics: °Metastatic Disease, First Line, PS  ?  2, BRCA1/2 and PALB2 Mutation Absent/Unknown °Therapeutic Status: Metastatic Disease °Line of Therapy: First Line °ECOG Performance Status: 2 °BRCA1/2 Mutation Status: Awaiting Test Results °PALB2 Mutation Status: Awaiting Test Results °Intent of Therapy: °Non-Curative / Palliative Intent, Discussed with Patient °

## 2021-10-14 NOTE — Progress Notes (Signed)
I met with Ms Leslie Mullen and her daughter Leslie Mullen after her consultation with Dr Burr Medico.  I explained my role as nurse navigator and provided my contact information.  I briefly explained services available at the [facility].  I briefly explained use and insertion of port a cath.  I showed them a sample port a cath.  I explained that she would be scheduled for a chemotherapy education class.  All questions were answered.  They verbalized understanding.

## 2021-10-15 ENCOUNTER — Encounter: Payer: Self-pay | Admitting: Hematology

## 2021-10-15 ENCOUNTER — Telehealth: Payer: Self-pay | Admitting: General Practice

## 2021-10-15 NOTE — Telephone Encounter (Signed)
[place] CSW Progress Notes  Call to patient per referral from medical oncologist, she has questions about disability application process.  Called patient, no answer, left VM w my contact information and encouragement to call back at her convenience.  Edwyna Shell, LCSW Clinical Social Worker Phone:  [phone]

## 2021-10-15 NOTE — Progress Notes (Signed)
Remote pacemaker transmission.   

## 2021-10-16 ENCOUNTER — Telehealth: Payer: Self-pay | Admitting: General Practice

## 2021-10-16 ENCOUNTER — Other Ambulatory Visit: Payer: Self-pay

## 2021-10-16 ENCOUNTER — Encounter: Payer: Self-pay | Admitting: General Practice

## 2021-10-16 ENCOUNTER — Other Ambulatory Visit: Payer: Self-pay | Admitting: Licensed Clinical Social Worker

## 2021-10-16 DIAGNOSIS — C259 Malignant neoplasm of pancreas, unspecified: Secondary | ICD-10-CM

## 2021-10-16 DIAGNOSIS — C787 Secondary malignant neoplasm of liver and intrahepatic bile duct: Secondary | ICD-10-CM

## 2021-10-16 NOTE — Telephone Encounter (Signed)
[place] CSW Progress Notes  Spoke w patient, addressed concerns.  Edwyna Shell, LCSW Clinical Social Worker Phone:  [phone]

## 2021-10-16 NOTE — Progress Notes (Signed)
[place] CSW Progress Notes  Call to patient per referral from medical oncologist. She has questions about applying for disability.  Talked w patient.  She applied for disability based on previous diagnoses - she has been denied twice.  She received a letter in September stating she can appeal the denial within 60 days.  However, she has also recently been diagnosed w Stage IV pancreatic cancer.  She wonders if this can be added to her current SSDI application.  CSW is not an expert in disability - referred her to talk to Social Security administration to determine best course of action.  Legal Aid can assist her if she runs into problems, contact information provided.  Unclear if [facility] can assist as she already has an application in process with IT trainer.  She will call us if she runs into any problems.  Edwyna Shell, LCSW Clinical Social Worker Phone:  [phone]

## 2021-10-20 ENCOUNTER — Inpatient Hospital Stay: Payer: 59

## 2021-10-20 ENCOUNTER — Encounter: Payer: Self-pay | Admitting: Licensed Clinical Social Worker

## 2021-10-20 ENCOUNTER — Telehealth: Payer: Self-pay | Admitting: *Deleted

## 2021-10-20 ENCOUNTER — Inpatient Hospital Stay: Payer: 59 | Admitting: Licensed Clinical Social Worker

## 2021-10-20 ENCOUNTER — Other Ambulatory Visit: Payer: Self-pay

## 2021-10-20 DIAGNOSIS — C541 Malignant neoplasm of endometrium: Secondary | ICD-10-CM

## 2021-10-20 DIAGNOSIS — C250 Malignant neoplasm of head of pancreas: Secondary | ICD-10-CM | POA: Diagnosis present

## 2021-10-20 DIAGNOSIS — Z5111 Encounter for antineoplastic chemotherapy: Secondary | ICD-10-CM | POA: Diagnosis present

## 2021-10-20 DIAGNOSIS — Z800 Family history of malignant neoplasm of digestive organs: Secondary | ICD-10-CM

## 2021-10-20 DIAGNOSIS — C787 Secondary malignant neoplasm of liver and intrahepatic bile duct: Secondary | ICD-10-CM

## 2021-10-20 DIAGNOSIS — Z79899 Other long term (current) drug therapy: Secondary | ICD-10-CM | POA: Diagnosis not present

## 2021-10-20 DIAGNOSIS — C259 Malignant neoplasm of pancreas, unspecified: Secondary | ICD-10-CM

## 2021-10-20 LAB — CBC WITH DIFFERENTIAL (CANCER CENTER ONLY)
Abs Immature Granulocytes: 0.07 10*3/uL (ref 0.00–0.07)
Basophils Absolute: 0 10*3/uL (ref 0.0–0.1)
Basophils Relative: 0 %
Eosinophils Absolute: 0.1 10*3/uL (ref 0.0–0.5)
Eosinophils Relative: 1 %
HCT: 29.7 % — ABNORMAL LOW (ref 36.0–46.0)
Hemoglobin: 8.9 g/dL — ABNORMAL LOW (ref 12.0–15.0)
Immature Granulocytes: 1 %
Lymphocytes Relative: 13 %
Lymphs Abs: 1.9 10*3/uL (ref 0.7–4.0)
MCH: 26.2 pg (ref 26.0–34.0)
MCHC: 30 g/dL (ref 30.0–36.0)
MCV: 87.4 fL (ref 80.0–100.0)
Monocytes Absolute: 1 10*3/uL (ref 0.1–1.0)
Monocytes Relative: 6 %
Neutro Abs: 12 10*3/uL — ABNORMAL HIGH (ref 1.7–7.7)
Neutrophils Relative %: 79 %
Platelet Count: 347 10*3/uL (ref 150–400)
RBC: 3.4 MIL/uL — ABNORMAL LOW (ref 3.87–5.11)
RDW: 13.4 % (ref 11.5–15.5)
WBC Count: 15.1 10*3/uL — ABNORMAL HIGH (ref 4.0–10.5)
nRBC: 0 % (ref 0.0–0.2)

## 2021-10-20 LAB — GENETIC SCREENING ORDER

## 2021-10-20 LAB — CMP (CANCER CENTER ONLY)
ALT: 7 U/L (ref 0–44)
AST: 13 U/L — ABNORMAL LOW (ref 15–41)
Albumin: 2.1 g/dL — ABNORMAL LOW (ref 3.5–5.0)
Alkaline Phosphatase: 80 U/L (ref 38–126)
Anion gap: 10 (ref 5–15)
BUN: 13 mg/dL (ref 6–20)
CO2: 28 mmol/L (ref 22–32)
Calcium: 8.7 mg/dL — ABNORMAL LOW (ref 8.9–10.3)
Chloride: 105 mmol/L (ref 98–111)
Creatinine: 1.89 mg/dL — ABNORMAL HIGH (ref 0.44–1.00)
GFR, Estimated: 30 mL/min — ABNORMAL LOW (ref >60)
Glucose, Bld: 151 mg/dL — ABNORMAL HIGH (ref 70–99)
Potassium: 3.9 mmol/L (ref 3.5–5.1)
Sodium: 143 mmol/L (ref 135–145)
Total Bilirubin: 0.2 mg/dL — ABNORMAL LOW (ref 0.3–1.2)
Total Protein: 7.8 g/dL (ref 6.5–8.1)

## 2021-10-20 NOTE — Progress Notes (Signed)
REFERRING PROVIDER: Malachy Mood, MD [address] [place],  [place] [address]  PRIMARY PROVIDER:  Nathaneil Canary, PA-C  PRIMARY REASON FOR VISIT:  1. Family history of colon cancer   2. Endometrial cancer (HCC)   3. Pancreatic cancer metastasized to liver [facility])      HISTORY OF PRESENT ILLNESS:   Leslie Mullen, a 60 y.o. female, was seen for a [place] cancer genetics consultation at the request of Dr. Mosetta Putt due to a personal and family history of cancer.  Ms. Devins presents to clinic today to discuss the possibility of a hereditary predisposition to cancer, genetic testing, and to further clarify her future cancer risks, as well as potential cancer risks for family members.   In 2021, at the age of 67, Leslie Mullen was diagnosed with endometrial cancer, grade 1.  In 2022, at the age of 67, Leslie Mullen was diagnosed with pancreatic cancer. This is being treated with chemotherapy.  CANCER HISTORY:  Oncology History Overview Note  Cancer Staging Pancreatic cancer metastasized to liver [facility]) Staging form: Exocrine Pancreas, AJCC 8th Edition - Clinical stage from 10/14/2021: Stage IV (cT3, cN1, pM1) - Signed by Malachy Mood, MD on 10/14/2021    Pancreatic cancer metastasized to liver (HCC)  09/24/2021 Imaging   EXAM: ULTRASOUND ABDOMEN LIMITED RIGHT UPPER QUADRANT  IMPRESSION: 5.3 cm lesion adjacent to the pancreatic head. While this may reflect a loop of bowel, a mass is not excluded. Consider CT abdomen with contrast for further evaluation.   09/25/2021 Imaging   EXAM: CT ABDOMEN AND PELVIS WITH CONTRAST  IMPRESSION: 4.1 cm low-density lesion in the pancreatic head/body, possibly reflecting focal pancreatitis given the clinical history. However, this warrants short-term follow-up in 4-6 weeks to document improvement/resolution. Alternatively, EUS could be considered for tissue sampling as clinically warranted.   10/06/2021 Imaging   EXAM: MRI ABDOMEN WITHOUT AND WITH  CONTRAST  IMPRESSION: Findings of pancreatic neoplasm with hepatic metastatic disease, local nodal disease likely with greater than 180 degree involvement of the portal vein, sampling of LEFT hepatic lobe lesion may be helpful to initiate further workup. Subsequent local staging could be performed with pancreatic protocol CT and EUS as indicated.   Local invasion of stomach and duodenum are suspected.   Mild stranding about the pancreas could be indicative of mild superimposed pancreatitis or local tumor extension particularly into the transverse mesocolon.   10/08/2021 Pathology Results   FINAL MICROSCOPIC DIAGNOSIS:   A. LIVER,  LEFT LOBE, BIOPSY:  -  Poorly differentiated carcinoma  -  See comment   COMMENT:  By immunohistochemistry, the neoplastic cells are positive for  cytokeratin 7 and weakly positive for HepPar.  The cells are negative for cytokeratin 20, GATA3, arginase, PAX8, CDX2 and TTF-1.  The immune O profile is nonspecific; however, the findings are compatible with a pancreatobiliary primary in the absence of other lesions.   10/14/2021 Initial Diagnosis   Pancreatic cancer metastasized to liver [facility])   10/14/2021 Cancer Staging   Staging form: Exocrine Pancreas, AJCC 8th Edition - Clinical stage from 10/14/2021: Stage IV (cT3, cN1, pM1) - Signed by Malachy Mood, MD on 10/14/2021 Stage prefix: Initial diagnosis    10/23/2021 -  Chemotherapy   Patient is on Treatment Plan : PANCREATIC Abraxane / Gemcitabine D1,8,15 q28d        RISK FACTORS:  First live birth at age 58.  Ovaries intact: yes.  Hysterectomy: no.  Colonoscopy: yes;  2018 . Mammogram within the last year: yes.  Past Medical History:  Diagnosis Date   Abnormal uterine bleeding    Allergy    Anemia    Asthma    Cataract    BOTH EYES   Complete heart block ()    a. Medtronic PPM 10/2020.   Diabetes ([place])    type II    Diabetic neuropathy ([place])    Endometrial cancer ([place])    Family  history of colon cancer    GERD (gastroesophageal reflux disease)    Hyperlipidemia    Hypertension    Morbid obesity ([place])    Osteoarthritis    right knee    Pancreatitis    Pneumonia     Past Surgical History:  Procedure Laterality Date   CERVICAL BIOPSY     CESAREAN SECTION     x3   COLONOSCOPY WITH PROPOFOL N/A 06/29/2017   Procedure: COLONOSCOPY WITH PROPOFOL;  Surgeon: Irene Shipper, MD;  Location: [facility] END[facility]COPY;  Service: Endoscopy;  Laterality: N/A;   CYSTECTOMY Right 2010   Cyst removed from right groin.    DILATION AND CURETTAGE OF UTERUS N/A 07/29/2021   Procedure: DILATATION AND CURETTAGE OF UTERUS;  Surgeon: Everitt Amber, MD;  Location: [facility] ORS;  Service: Gynecology;  Laterality: N/A;   INTRAUTERINE DEVICE (IUD) INSERTION N/A 07/29/2021   Procedure: INTRAUTERINE DEVICE (IUD) INSERTION;  Surgeon: Everitt Amber, MD;  Location: [facility] ORS;  Service: Gynecology;  Laterality: N/A;  IUD FROM MAIN PHARMACY   PACEMAKER IMPLANT N/A 10/11/2020   Procedure: PACEMAKER IMPLANT;  Surgeon: Vickie Epley, MD;  Location: [facility] CV LAB;  Service: Cardiovascular;  Laterality: N/A;   TONSILLECTOMY      Social History   Socioeconomic History   Marital status: Married    Spouse name: Not on file   Number of children: 2   Years of education: Not on file   Highest education level: Not on file  Occupational History   Occupation: Intake Coordinator  Tobacco Use   Smoking status: Former    Types: Cigarettes   Smokeless tobacco: Never   Tobacco comments:    Former smoker (09/18/2021)  Vaping Use   Vaping Use: Never used  Substance and Sexual Activity   Alcohol use: No    Alcohol/week: 0.0 standard drinks   Drug use: No   Sexual activity: Not on file  Other Topics Concern   Not on file  Social History Narrative   Not on file   Social Determinants of Health   Financial Resource Strain: Not on file  Food Insecurity: Not on file  Transportation Needs: Not on file  Physical  Activity: Not on file  Stress: Not on file  Social Connections: Not on file     FAMILY HISTORY:  We obtained a detailed, 4-generation family history.  Significant diagnoses are listed below: Family History  Adopted: Yes  Problem Relation Age of Onset   Diabetes Father    Heart disease Father    Cancer Mother        colon   Colon cancer Neg Hx    Esophageal cancer Neg Hx    Rectal cancer Neg Hx    Stomach cancer Neg Hx    Leslie Mullen has 1 daughter, 31, and 1 son, 53, no cancers. She is adopted but has learned she had 3 paternal half brothers and 1 maternal half sister. Only one of the paternal half brothers is living. No cancers that she is aware of for her siblings.  Leslie Mullen mother had colon  cancer, unknown age at diagnosis. Patient had 4 maternal aunts, 2 maternal uncles, no known cancers she is aware of but she has limited information. No information about maternal grandparents.  Leslie Mullen father is living at 63. Patient had 4 paternal aunts, 3 paternal uncles. One uncle and her paternal grandmother possibly had cancer, unknown types. No other known cancer on this side of the family, limited info.  Leslie Mullen is unaware of previous family history of genetic testing for hereditary cancer risks. Patient's maternal ancestors are of Hispanic descent, and paternal ancestors are of [place] American descent. She reports 1% Jewish ancestry from her Ancestry DNA report. There is no known consanguinity.    GENETIC COUNSELING ASSESSMENT: Leslie Mullen is a 60 y.o. female with a personal and family history which is somewhat suggestive of a hereditary cancer syndrome and predisposition to cancer. We, therefore, discussed and recommended the following at today's visit.   DISCUSSION: We discussed that approximately  of 10% of pancreatic cancer is hereditary. Most cases of hereditary pancreatic cancer are associated with BRCA1/BRCA2 genes, although there are other genes associated with  hereditary  cancer as well. Cancers and risks are gene specific.  We discussed that testing is beneficial for several reasons including knowing if an individual is a candidate for certain targeted therapies such as PARP inhibitors, knowing about other cancer risks, identifying potential screening and risk-reduction options that may be appropriate, and to understand if other family members could be at risk for cancer and allow them to undergo genetic testing.   We reviewed the characteristics, features and inheritance patterns of hereditary cancer syndromes. We also discussed genetic testing, including the appropriate family members to test, the process of testing, insurance coverage and turn-around-time for results. We discussed the implications of a negative, positive and/or variant of uncertain significant result. We recommended Leslie Mullen pursue genetic testing for the [facility] Common Hereditary Cancers+RNA+ pancreatitis gene panel.   Based on Leslie Mullen's personal and family history of cancer, she meets medical criteria for genetic testing. Despite that she meets criteria, she may still have an out of pocket cost. We discussed that if her out of pocket cost for testing is over $100, the laboratory will call and confirm whether she wants to proceed with testing.  If the out of pocket cost of testing is less than $100 she will be billed by the genetic testing laboratory.   PLAN: After considering the risks, benefits, and limitations, Leslie Mullen provided informed consent to pursue genetic testing and the blood sample was sent to [facility] for analysis of the Common Hereditary Cancers+RNA+pancreatitis panel. Results should be available within approximately 2-3 weeks' time, at which point they will be disclosed by telephone to Leslie Mullen, as will any additional recommendations warranted by these results. Leslie Mullen will receive a summary of her genetic counseling visit and a copy of her results once  available. This information will also be available in Epic.   Leslie Mullen questions were answered to her satisfaction today. Our contact information was provided should additional questions or concerns arise. Thank you for the referral and allowing [place] to share in the care of your patient.   Faith Rogue, MS, [facility] Genetic Counselor [email or web address] Phone: [phone]  The patient was seen for a total of 30 minutes in face-to-face genetic counseling.  Patient was seen with her husband. Dr. Grayland Ormond was available for discussion regarding this case.   _______________________________________________________________________ For Office Staff:  Number of people involved in session: 2 Was  an Intern/ student involved with case: yes

## 2021-10-20 NOTE — Telephone Encounter (Signed)
Per Dr.Feng, called pt with message below.

## 2021-10-20 NOTE — Progress Notes (Signed)
Nutrition Assessment:  60 year old female with stage IV pancreatic cancer.  Past medical history of DM, endometrial cancer, GERD, HLD, HTN, obesity, osteoarthritis.  Patient planning to start chemotherapy.   Met with patient and husband in clinic this am.  Patient reports that her appetite has decreased, eating about 50% of usual intake for the last month.  Reports foods taste bitter and smells of foods make her nauseated.  Reports that yesterday she was able to eat 1 bite of bacon biscuit and drank most of gingerale.  Reports chips and salsa after church.  Also ate 3 bites of meatloaf, 1/2 of yams and fried okra and 2 bites of catfish yesterday.  Says that fish/seafood do not make her feel nauseated.  Reports that she has been taking zofran and compazine.  Likes tapioca pudding and cottage cheese and fruit, jello with fruit. Says that she can taste fruit flavors better.  Also can't tolerate smooth textures like yogurt or pudding but that has always been an issue.  Reports constipation with requiring manual disimpaction yesterday.  Had bowel movement this am without difficulty.  Planning to start taking stool softner today     Medications: remeron, prilosec, zofran, compazine  Labs: reviewed  Anthropometrics:   Height: 68 inches Weight: 330.14.4 oz on 11/8 UBW: 360 lb Feb 2022 BMI: 50  8% weight loss in the last 4 months  NUTRITION DIAGNOSIS: Inadequate oral intake related to cancer (taste change, poor appetite) as evidenced by 8% weight loss in the last 4 months and eating 50% less than usual intake   INTERVENTION:  Discussed strategies to help with taste change. Handout provided Encouraged patient to continue with  nausea medications Encouraged small frequent meals/snacks Gave samples of ensure complete with coupons to try.  Patient has tried ensure original and high protein at home.   Contact information provided    MONITORING, EVALUATION, GOAL: weight trends, intake   NEXT  VISIT: Friday, Nov 25 during infusion  Horald Birky B. Zenia Resides, [place], [place] Registered Dietitian [phone] (mobile)

## 2021-10-20 NOTE — Telephone Encounter (Signed)
-----   Message from Truitt Merle, MD sent at 10/19/2021 10:26 PM EST ----- Please let pt know her CT chest result, no metastasis in chest, good news.   Truitt Merle  10/19/2021

## 2021-10-21 LAB — CANCER ANTIGEN 19-9: CA 19-9: 1813 U/mL — ABNORMAL HIGH (ref 0–35)

## 2021-10-21 NOTE — Progress Notes (Signed)
Pharmacist Chemotherapy Monitoring - Initial Assessment    Anticipated start date: 10/23/21  The following has been reviewed per standard work regarding the patient's treatment regimen: The patient's diagnosis, treatment plan and drug doses, and organ/hematologic function Lab orders and baseline tests specific to treatment regimen  The treatment plan start date, drug sequencing, and pre-medications Prior authorization status  Patient's documented medication list, including drug-drug interaction screen and prescriptions for anti-emetics and supportive care specific to the treatment regimen The drug concentrations, fluid compatibility, administration routes, and timing of the medications to be used The patient's access for treatment and lifetime cumulative dose history, if applicable  The patient's medication allergies and previous infusion related reactions, if applicable   Changes made to treatment plan:  N/A  Follow up needed:  N/A   Leslie Mullen, [facility], 10/21/2021  12:28 PM

## 2021-10-22 ENCOUNTER — Inpatient Hospital Stay: Payer: 59

## 2021-10-22 ENCOUNTER — Other Ambulatory Visit: Payer: Self-pay

## 2021-10-22 ENCOUNTER — Encounter: Payer: Self-pay | Admitting: Hematology

## 2021-10-22 NOTE — Progress Notes (Signed)
The proposed treatment discussed in conference is for discussion purpose only and is not a binding recommendation.  The patients have not been physically examined, or presented with their treatment options.  Therefore, final treatment plans cannot be decided.  

## 2021-10-22 NOTE — Progress Notes (Signed)
Met with patient at registration to introduce myself as Arboriculturist and to offer available resources.  Discussed one-time $1000 Radio broadcast assistant to assist with personal expenses while going through treatment.  Gave her my card if interested in applying and for any additional financial questions or concerns.

## 2021-10-23 ENCOUNTER — Encounter (HOSPITAL_COMMUNITY): Payer: Self-pay

## 2021-10-23 ENCOUNTER — Ambulatory Visit (HOSPITAL_COMMUNITY): Payer: 59 | Admitting: Physician Assistant

## 2021-10-23 ENCOUNTER — Telehealth: Payer: Self-pay

## 2021-10-23 ENCOUNTER — Inpatient Hospital Stay: Payer: 59

## 2021-10-23 VITALS — BP 129/76 | HR 86 | Temp 98.1°F | Resp 19

## 2021-10-23 DIAGNOSIS — Z5111 Encounter for antineoplastic chemotherapy: Secondary | ICD-10-CM | POA: Diagnosis not present

## 2021-10-23 DIAGNOSIS — C787 Secondary malignant neoplasm of liver and intrahepatic bile duct: Secondary | ICD-10-CM

## 2021-10-23 LAB — CBC WITH DIFFERENTIAL (CANCER CENTER ONLY)
Abs Immature Granulocytes: 0.07 10*3/uL (ref 0.00–0.07)
Basophils Absolute: 0 10*3/uL (ref 0.0–0.1)
Basophils Relative: 0 %
Eosinophils Absolute: 0.2 10*3/uL (ref 0.0–0.5)
Eosinophils Relative: 2 %
HCT: 29.6 % — ABNORMAL LOW (ref 36.0–46.0)
Hemoglobin: 9 g/dL — ABNORMAL LOW (ref 12.0–15.0)
Immature Granulocytes: 0 %
Lymphocytes Relative: 19 %
Lymphs Abs: 3 10*3/uL (ref 0.7–4.0)
MCH: 26.7 pg (ref 26.0–34.0)
MCHC: 30.4 g/dL (ref 30.0–36.0)
MCV: 87.8 fL (ref 80.0–100.0)
Monocytes Absolute: 1.1 10*3/uL — ABNORMAL HIGH (ref 0.1–1.0)
Monocytes Relative: 7 %
Neutro Abs: 11.2 10*3/uL — ABNORMAL HIGH (ref 1.7–7.7)
Neutrophils Relative %: 72 %
Platelet Count: 402 10*3/uL — ABNORMAL HIGH (ref 150–400)
RBC: 3.37 MIL/uL — ABNORMAL LOW (ref 3.87–5.11)
RDW: 13.3 % (ref 11.5–15.5)
WBC Count: 15.6 10*3/uL — ABNORMAL HIGH (ref 4.0–10.5)
nRBC: 0 % (ref 0.0–0.2)

## 2021-10-23 LAB — CMP (CANCER CENTER ONLY)
ALT: 7 U/L (ref 0–44)
AST: 10 U/L — ABNORMAL LOW (ref 15–41)
Albumin: 3.1 g/dL — ABNORMAL LOW (ref 3.5–5.0)
Alkaline Phosphatase: 72 U/L (ref 38–126)
Anion gap: 9 (ref 5–15)
BUN: 14 mg/dL (ref 6–20)
CO2: 30 mmol/L (ref 22–32)
Calcium: 9.3 mg/dL (ref 8.9–10.3)
Chloride: 104 mmol/L (ref 98–111)
Creatinine: 1.73 mg/dL — ABNORMAL HIGH (ref 0.44–1.00)
GFR, Estimated: 34 mL/min — ABNORMAL LOW (ref >60)
Glucose, Bld: 189 mg/dL — ABNORMAL HIGH (ref 70–99)
Potassium: 5 mmol/L (ref 3.5–5.1)
Sodium: 143 mmol/L (ref 135–145)
Total Bilirubin: 0.2 mg/dL — ABNORMAL LOW (ref 0.3–1.2)
Total Protein: 7.8 g/dL (ref 6.5–8.1)

## 2021-10-23 MED ORDER — PROCHLORPERAZINE MALEATE 10 MG PO TABS
10.0000 mg | ORAL_TABLET | Freq: Once | ORAL | Status: AC
  Administered 2021-10-23: 09:00:00 10 mg via ORAL
  Filled 2021-10-23: qty 1

## 2021-10-23 MED ORDER — SODIUM CHLORIDE 0.9 % IV SOLN: Freq: Once | INTRAVENOUS | Status: AC

## 2021-10-23 MED ORDER — ONDANSETRON 4 MG PO TBDP: 4.0000 mg | ORAL_TABLET | Freq: Three times a day (TID) | ORAL | 1 refills | Status: DC | PRN

## 2021-10-23 MED ORDER — SODIUM CHLORIDE 0.9 % IV SOLN: Freq: Once | INTRAVENOUS | Status: DC

## 2021-10-23 MED ORDER — SODIUM CHLORIDE 0.9 % IV SOLN
1000.0000 mg/m2 | Freq: Once | INTRAVENOUS | Status: AC
  Administered 2021-10-23: 11:00:00 2698 mg via INTRAVENOUS
  Filled 2021-10-23: qty 52.6

## 2021-10-23 MED ORDER — PACLITAXEL PROTEIN-BOUND CHEMO INJECTION 100 MG
125.0000 mg/m2 | Freq: Once | INTRAVENOUS | Status: AC
  Administered 2021-10-23: 10:00:00 325 mg via INTRAVENOUS
  Filled 2021-10-23: qty 65

## 2021-10-23 NOTE — Patient Instructions (Addendum)
[facility] [place]  Discharge Instructions: Thank you for choosing [facility] to provide your oncology and hematology care.   If you have a lab appointment with the [facility], please go directly to the [facility] and check in at the registration area.  Wear comfortable clothing and clothing appropriate for easy access to any Portacath or PICC line.   We strive to give you quality time with your provider. You may need to reschedule your appointment if you arrive late (15 or more minutes).  Arriving late affects you and other patients whose appointments are after yours.  Also, if you miss three or more appointments without notifying the office, you may be dismissed from the clinic at the provider's discretion.      For prescription refill requests, have your pharmacy contact our office and allow 72 hours for refills to be completed.    Today you received the following chemotherapy and/or immunotherapy agents Abraxane and Gemzar      To help prevent nausea and vomiting after your treatment, we encourage you to take your nausea medication as directed.  BELOW ARE SYMPTOMS THAT SHOULD BE REPORTED IMMEDIATELY: *FEVER GREATER THAN 100.4 F (38 C) OR HIGHER *CHILLS OR SWEATING *NAUSEA AND VOMITING THAT IS NOT CONTROLLED WITH YOUR NAUSEA MEDICATION *UNUSUAL SHORTNESS OF BREATH *UNUSUAL BRUISING OR BLEEDING *URINARY PROBLEMS (pain or burning when urinating, or frequent urination) *BOWEL PROBLEMS (unusual diarrhea, constipation, pain near the anus) TENDERNESS IN MOUTH AND THROAT WITH OR WITHOUT PRESENCE OF ULCERS (sore throat, sores in mouth, or a toothache) UNUSUAL RASH, SWELLING OR PAIN  UNUSUAL VAGINAL DISCHARGE OR ITCHING   Items with * indicate a potential emergency and should be followed up as soon as possible or go to the Emergency Department if any problems should occur.  Please show the CHEMOTHERAPY ALERT CARD or IMMUNOTHERAPY ALERT CARD at  check-in to the Emergency Department and triage nurse. Should you have questions after your visit or need to cancel or reschedule your appointment, please contact [facility]  [phone] and follow the prompts.  Office hours are 8:00 a.m. to 4:30 p.m. Monday - Friday. Please note that voicemails left after 4:00 p.m. may not be returned until the following business day.  We are closed weekends and major holidays. You have access to a nurse at all times for urgent questions. Please call the main number to the clinic [phone] and follow the prompts.  For any non-urgent questions, you may also contact your provider using MyChart. We now offer e-Visits for anyone 66 and older to request care online for non-urgent symptoms. For details visit [email or web address].   Also download the MyChart app! Go to the app store, search "MyChart", open the app, select [facility], and log in with your MyChart username and password.  Due to Covid, a mask is required upon entering the hospital/clinic. If you do not have a mask, one will be given to you upon arrival. For doctor visits, patients may have 1 support person aged 8 or older with them. For treatment visits, patients cannot have anyone with them due to current Covid guidelines and our immunocompromised population.

## 2021-10-23 NOTE — Telephone Encounter (Signed)
Pt called stating she is in 8/10 pain in her abdomen and has had 2 episodes of vomiting.  Pt stated she took her Compazine at 12pm with no nausea relief and took her scheduled Zofran 8mg  at 4pm.  Instructed pt to continue to take the Compazine at 8pm and incorporate some ginger into her diet to help with the nausea she is experiencing.  New prescription refill for ODT 4mg  Zofran sent to [facility] for pt to take when vomiting.  Instructed pt to increase her fluid intake by drinking Liquid IV or Pedialyte if tolerated.  Pt verbalized understanding of instruction. Instructed pt to contact Dr. Ernestina Penna office tomorrow 10/24/2021 should her nausea not improve.  Pt confirmed that she is still taking Prilosec as well.

## 2021-10-23 NOTE — Progress Notes (Incomplete)
Primary Care Physician: Sue Lush, PA-C Primary Cardiologist: Dr Johnsie Cancel Primary Electrophysiologist: Dr Quentin Ore Referring Physician: Dr Quentin Ore   Leslie Mullen is a 61 y.o. female with a history of CHB s/p PPM, DM, HTN, HLD, endometrial cancer, atrial fibrillation who presents for follow up in the [facility].  The patient was initially diagnosed with atrial fibrillation 09/01/21 after the device clinic received an alert for an ongoing afib episode starting 08/30/21. Patient did have palpitations and SOB with exertion at that time. Patient has a CHADS2VASC score of 3. She is back in SR today and reports her palpitations stopped about one week ago. She denies significant snoring or alcohol use. She did have an upper and lower EGD 09/05/21 for worsening reflux which showed edema of duodenal bulb, no bleeding.   On follow up today, patient was admitted with abdominal pain 09/24/21. Unfortunately, she has been diagnosed with stage IV pancreatic cancer, followed by Dr Burr Medico. ***  Today, she denies symptoms of ***palpitations, chest pain, shortness of breath, orthopnea, PND, lower extremity edema, dizziness, presyncope, syncope, snoring, daytime somnolence, bleeding, or neurologic sequela. The patient is tolerating medications without difficulties and is otherwise without complaint today.    Atrial Fibrillation Risk Factors:  she does not have symptoms or diagnosis of sleep apnea. she does not have a history of rheumatic fever. she does not have a history of alcohol use. The patient does have a history of early familial atrial fibrillation or other arrhythmias. Father has afib.  she has a BMI of There is no height or weight on file to calculate BMI.. There were no vitals filed for this visit.   Family History  Adopted: Yes  Problem Relation Age of Onset   Diabetes Father    Heart disease Father    Cancer Mother        colon   Colon cancer Neg Hx     Esophageal cancer Neg Hx    Rectal cancer Neg Hx    Stomach cancer Neg Hx      Atrial Fibrillation Management history:  Previous antiarrhythmic drugs: none Previous cardioversions: none Previous ablations: none CHADS2VASC score: 3 Anticoagulation history: Eliquis   Past Medical History:  Diagnosis Date   Abnormal uterine bleeding    Allergy    Anemia    Asthma    Cataract    BOTH EYES   Complete heart block ([place])    a. Medtr[employer] PPM 10/2020.   Diabetes ([place])    type II    Diabetic neuropathy ([place])    Endometrial cancer ([place])    Family history of colon cancer    GERD (gastroesophageal reflux disease)    Hyperlipidemia    Hypertension    Morbid obesity ([place])    Osteoarthritis    right knee    Pancreatitis    Pneumonia    Past Surgical History:  Procedure Laterality Date   CERVICAL BIOPSY     CESAREAN SECTION     x3   COLONOSCOPY WITH PROPOFOL N/A 06/29/2017   Procedure: COLONOSCOPY WITH PROPOFOL;  Surgeon: Irene Shipper, MD;  Location: [facility];  Service: Endoscopy;  Laterality: N/A;   CYSTECTOMY Right 2010   Cyst removed from right groin.    DILATION AND CURETTAGE OF UTERUS N/A 07/29/2021   Procedure: DILATATION AND CURETTAGE OF UTERUS;  Surgeon: Everitt Amber, MD;  Location: [facility];  Service: Gynecology;  Laterality: N/A;   INTRAUTERINE DEVICE (IUD) INSERTION N/A 07/29/2021   Procedure:  INTRAUTERINE DEVICE (IUD) INSERTION;  Surgeon: Everitt Amber, MD;  Location: [facility];  Service: Gynecology;  Laterality: N/A;  IUD FROM MAIN PHARMACY   PACEMAKER IMPLANT N/A 10/11/2020   Procedure: PACEMAKER IMPLANT;  Surgeon: Vickie Epley, MD;  Location: [facility];  Service: Cardiovascular;  Laterality: N/A;   TONSILLECTOMY      Current Outpatient Medications  Medication Sig Dispense Refill   amLODipine (NORVASC) 10 MG tablet Take 1 tablet (10 mg total) by mouth at bedtime. 30 tablet 0   apixaban (ELIQUIS) 5 MG TABS tablet Take 1 tablet (5 mg total) by mouth 2  (two) times daily. 60 tablet 3   gabapentin (NEURONTIN) 300 MG capsule Take 1 capsule (300 mg total) by mouth 2 (two) times daily. (Patient taking differently: Take 300 mg by mouth 3 (three) times daily.)     glucose blood test strip      insulin degludec (TRESIBA FLEXTOUCH) 200 UNIT/ML FlexTouch Pen Inject 20 Units into the skin every evening. Start at 20 units daily-if you blood glucose levels are still on the higher side-slowly increase back to your usual regimen of 80 units daily.     Insulin Pen Needle 31G X 5 MM MISC      lidocaine-prilocaine (EMLA) cream Apply to affected area once 30 g 3   loratadine (CLARITIN) 10 MG tablet Take 10 mg by mouth daily as needed for allergies.     mirtazapine (REMERON) 7.5 MG tablet Take 1 tablet (7.5 mg total) by mouth at bedtime. 30 tablet 0   omeprazole (PRILOSEC) 40 MG capsule Take 40 mg by mouth 2 (two) times daily.     ondansetron (ZOFRAN ODT) 4 MG disintegrating tablet Take 1 tablet (4 mg total) by mouth every 8 (eight) hours as needed for nausea or vomiting. 20 tablet 0   ondansetron (ZOFRAN) 8 MG tablet Take 1 tablet (8 mg total) by mouth 2 (two) times daily as needed (Nausea or vomiting). 30 tablet 1   ONETOUCH DELICA LANCETS 26R MISC      Oxycodone HCl 10 MG TABS Take 1 tablet (10 mg total) by mouth every 6 (six) hours as needed. 90 tablet 0   polyethylene glycol (MIRALAX / GLYCOLAX) 17 g packet Take 17 g by mouth daily as needed for moderate constipation.     pravastatin (PRAVACHOL) 40 MG tablet TAKE 1 TABLET(40 MG) BY MOUTH DAILY 90 tablet 1   prochlorperazine (COMPAZINE) 10 MG tablet Take 1 tablet (10 mg total) by mouth every 6 (six) hours as needed for nausea or vomiting. 30 tablet 1   prochlorperazine (COMPAZINE) 10 MG tablet Take 1 tablet (10 mg total) by mouth every 6 (six) hours as needed (Nausea or vomiting). 30 tablet 1   SYNJARDY XR 12.04-999 MG TB24 Take 1 tablet by mouth 2 (two) times daily.     trolamine salicylate (ASPERCREME) 10 %  cream Apply 1 application topically 2 (two) times daily as needed for muscle pain.     No current facility-administered medications for this visit.   Facility-Administered Medications Ordered in Other Visits  Medication Dose Route Frequency Provider Last Rate Last Admin   0.9 %  sodium chloride infusion   Intravenous Once Truitt Merle, MD        Allergies  Allergen Reactions   Penicillins Shortness Of Breath, Itching and Swelling    Has patient had a PCN reaction causing immediate rash, facial/tongue/throat swelling, SOB or lightheadedness with hypotension: Yes Has patient had a PCN reaction causing  severe rash involving mucus membranes or skin necrosis:No Has patient had a PCN reaction that required hospitalization: No Has patient had a PCN reaction occurring within the last 10 years: no If all of the above answers are "NO", then may proceed with Cephalosporin use.      Social History   Socioeconomic History   Marital status: Married    Spouse name: Not on file   Number of children: 2   Years of education: Not on file   Highest education level: Not on file  Occupational History   Occupation: Intake Coordinator  Tobacco Use   Smoking status: Former    Types: Cigarettes   Smokeless tobacco: Never   Tobacco comments:    Former smoker (09/18/2021)  Vaping Use   Vaping Use: Never used  Substance and Sexual Activity   Alcohol use: No    Alcohol/week: 0.0 standard drinks   Drug use: No   Sexual activity: Not on file  Other Topics Concern   Not on file  Social History Narrative   Not on file   Social Determinants of Health   Financial Resource Strain: Not on file  Food Insecurity: Not on file  Transportation Needs: Not on file  Physical Activity: Not on file  Stress: Not on file  Social Connections: Not on file  Intimate Partner Violence: Not on file     ROS- All systems are reviewed and negative except as per the HPI above.  Physical Exam: There were no vitals  filed for this visit.  GEN- The patient is a well appearing *** {Desc; female/female:11659}, alert and oriented x 3 today.   HEENT-head normocephalic, atraumatic, sclera clear, conjunctiva pink, hearing intact, trachea midline. Lungs- Clear to ausculation bilaterally, normal work of breathing Heart- ***Regular rate and rhythm, no murmurs, rubs or gallops  GI- soft, NT, ND, + BS Extremities- no clubbing, cyanosis, or edema MS- no significant deformity or atrophy Skin- no rash or lesion Psych- euthymic mood, full affect Neuro- strength and sensation are intact   Wt Readings from Last 3 Encounters:  10/14/21 (!) 150.1 kg  10/05/21 (!) 152.7 kg  09/18/21 (!) 158.8 kg    EKG today demonstrates  ***  Echo 10/10/20 demonstrated  1. Left ventricular ejection fraction, by estimation, is 65 to 70%. The  left ventricle has normal function. The left ventricle has no regional  wall motion abnormalities. There is moderate concentric left ventricular hypertrophy. Left ventricular diastolic parameters are consistent with Grade I diastolic dysfunction (impaired relaxation). Elevated left atrial pressure.   2. Right ventricular systolic function is normal. The right ventricular  size is normal. There is normal pulmonary artery systolic pressure.   3. The mitral valve is normal in structure. No evidence of mitral valve regurgitation. No evidence of mitral stenosis.   4. The aortic valve is normal in structure. Aortic valve regurgitation is not visualized. No aortic stenosis is present.   5. The inferior vena cava is normal in size with greater than 50%  respiratory variability, suggesting right atrial pressure of 3 mmHg.   Epic records are reviewed at length today  CHA2DS2-VASc Score = 3  The patient's score is based upon: CHF History: 0 HTN History: 1 Diabetes History: 1 Stroke History: 0 Vascular Disease History: 0 Age Score: 0 Gender Score: 1      ASSESSMENT AND PLAN: 1. Paroxysmal  Atrial Fibrillation (ICD10:  I48.0) The patient's CHA2DS2-VASc score is 3, indicating a 3.2% annual risk of stroke.   No interim  episodes of afib on her device. *** Continue Eliquis 5 mg BID  2. Secondary Hypercoagulable State (ICD10:  D68.69) The patient is at significant risk for stroke/thromboembolism based upon her CHA2DS2-VASc Score of 3.  Start Apixaban (Eliquis).   3. Obesity There is no height or weight on file to calculate BMI. Lifestyle modification was discussed and encouraged including regular physical activity and weight reduction. ***  4. CHB S/p PPM, followed by Dr Quentin Ore and the device clinic. ***  5. HTN Stable, no changes today. ***  6. Pancreatic cancer Plans per Dr Burr Medico. ***   Follow up ***   Adline Peals PA-C [facility] [place], [place] [address] [phone] 10/23/2021 1:25 PM

## 2021-10-23 NOTE — Progress Notes (Signed)
I spoke with Tiffany from IR to f/u on order for port placement.  Tiffany left a vm for Ms Rocco but she has not returned her call.  I reached out to Ms Minchew daughter [name] relating this information to her.  I provided Tiffany's directed contact information.  She said she and her mother would call Tiffany today.

## 2021-10-24 ENCOUNTER — Emergency Department (HOSPITAL_COMMUNITY)
Admission: EM | Admit: 2021-10-24 | Discharge: 2021-10-25 | Disposition: A | Discharge status: Home / Self Care | Payer: 59 | Attending: Emergency Medicine | Admitting: Emergency Medicine

## 2021-10-24 ENCOUNTER — Other Ambulatory Visit: Payer: Self-pay

## 2021-10-24 DIAGNOSIS — I129 Hypertensive chronic kidney disease with stage 1 through stage 4 chronic kidney disease, or unspecified chronic kidney disease: Secondary | ICD-10-CM | POA: Insufficient documentation

## 2021-10-24 DIAGNOSIS — Z8541 Personal history of malignant neoplasm of cervix uteri: Secondary | ICD-10-CM | POA: Diagnosis not present

## 2021-10-24 DIAGNOSIS — Z79899 Other long term (current) drug therapy: Secondary | ICD-10-CM | POA: Insufficient documentation

## 2021-10-24 DIAGNOSIS — R112 Nausea with vomiting, unspecified: Secondary | ICD-10-CM | POA: Insufficient documentation

## 2021-10-24 DIAGNOSIS — Z87891 Personal history of nicotine dependence: Secondary | ICD-10-CM | POA: Diagnosis not present

## 2021-10-24 DIAGNOSIS — E1142 Type 2 diabetes mellitus with diabetic polyneuropathy: Secondary | ICD-10-CM | POA: Insufficient documentation

## 2021-10-24 DIAGNOSIS — D72829 Elevated white blood cell count, unspecified: Secondary | ICD-10-CM | POA: Diagnosis not present

## 2021-10-24 DIAGNOSIS — Z794 Long term (current) use of insulin: Secondary | ICD-10-CM | POA: Diagnosis not present

## 2021-10-24 DIAGNOSIS — J45909 Unspecified asthma, uncomplicated: Secondary | ICD-10-CM | POA: Insufficient documentation

## 2021-10-24 DIAGNOSIS — Z7901 Long term (current) use of anticoagulants: Secondary | ICD-10-CM | POA: Diagnosis not present

## 2021-10-24 DIAGNOSIS — N1831 Chronic kidney disease, stage 3a: Secondary | ICD-10-CM | POA: Insufficient documentation

## 2021-10-24 DIAGNOSIS — E1122 Type 2 diabetes mellitus with diabetic chronic kidney disease: Secondary | ICD-10-CM | POA: Insufficient documentation

## 2021-10-24 DIAGNOSIS — I480 Paroxysmal atrial fibrillation: Secondary | ICD-10-CM | POA: Insufficient documentation

## 2021-10-24 DIAGNOSIS — R1010 Upper abdominal pain, unspecified: Secondary | ICD-10-CM | POA: Diagnosis not present

## 2021-10-24 DIAGNOSIS — Z8507 Personal history of malignant neoplasm of pancreas: Secondary | ICD-10-CM | POA: Insufficient documentation

## 2021-10-24 LAB — SURGICAL PATHOLOGY

## 2021-10-24 LAB — CANCER ANTIGEN 19-9: CA 19-9: 1980 U/mL — ABNORMAL HIGH (ref 0–35)

## 2021-10-24 NOTE — ED Triage Notes (Signed)
Patient here with nausea, vomiting and not being able to keep anything down.  Patient has Stage 4 Pancreatic CA.  Patient is receiving active chemo at this time.  Pain in her back and stomach.

## 2021-10-25 ENCOUNTER — Emergency Department (HOSPITAL_COMMUNITY): Payer: 59

## 2021-10-25 ENCOUNTER — Encounter (HOSPITAL_COMMUNITY): Payer: Self-pay | Admitting: Emergency Medicine

## 2021-10-25 ENCOUNTER — Other Ambulatory Visit: Payer: Self-pay

## 2021-10-25 LAB — CBC
HCT: 30.8 % — ABNORMAL LOW (ref 36.0–46.0)
Hemoglobin: 9.2 g/dL — ABNORMAL LOW (ref 12.0–15.0)
MCH: 26 pg (ref 26.0–34.0)
MCHC: 29.9 g/dL — ABNORMAL LOW (ref 30.0–36.0)
MCV: 87 fL (ref 80.0–100.0)
Platelets: 412 10*3/uL — ABNORMAL HIGH (ref 150–400)
RBC: 3.54 MIL/uL — ABNORMAL LOW (ref 3.87–5.11)
RDW: 13.5 % (ref 11.5–15.5)
WBC: 14.8 10*3/uL — ABNORMAL HIGH (ref 4.0–10.5)
nRBC: 0 % (ref 0.0–0.2)

## 2021-10-25 LAB — COMPREHENSIVE METABOLIC PANEL
ALT: 11 U/L (ref 0–44)
AST: 18 U/L (ref 15–41)
Albumin: 2 g/dL — ABNORMAL LOW (ref 3.5–5.0)
Alkaline Phosphatase: 70 U/L (ref 38–126)
Anion gap: 12 (ref 5–15)
BUN: 13 mg/dL (ref 6–20)
CO2: 25 mmol/L (ref 22–32)
Calcium: 8.6 mg/dL — ABNORMAL LOW (ref 8.9–10.3)
Chloride: 103 mmol/L (ref 98–111)
Creatinine, Ser: 1.47 mg/dL — ABNORMAL HIGH (ref 0.44–1.00)
GFR, Estimated: 41 mL/min — ABNORMAL LOW (ref >60)
Glucose, Bld: 184 mg/dL — ABNORMAL HIGH (ref 70–99)
Potassium: 4.1 mmol/L (ref 3.5–5.1)
Sodium: 140 mmol/L (ref 135–145)
Total Bilirubin: 0.6 mg/dL (ref 0.3–1.2)
Total Protein: 7.7 g/dL (ref 6.5–8.1)

## 2021-10-25 LAB — CBG MONITORING, ED: Glucose-Capillary: 153 mg/dL — ABNORMAL HIGH (ref 70–99)

## 2021-10-25 LAB — URINALYSIS, ROUTINE W REFLEX MICROSCOPIC
Bacteria, UA: NONE SEEN
Bilirubin Urine: NEGATIVE
Glucose, UA: >=500 mg/dL — AB
Ketones, ur: 5 mg/dL — AB
Leukocytes,Ua: NEGATIVE
Nitrite: NEGATIVE
Protein, ur: 100 mg/dL — AB
Specific Gravity, Urine: 1.041 — ABNORMAL HIGH (ref 1.005–1.030)
pH: 6 (ref 5.0–8.0)

## 2021-10-25 LAB — I-STAT BETA HCG BLOOD, ED (MC, WL, AP ONLY): I-stat hCG, quantitative: <5 m[IU]/mL (ref <5)

## 2021-10-25 LAB — LIPASE, BLOOD: Lipase: 91 U/L — ABNORMAL HIGH (ref 11–51)

## 2021-10-25 MED ORDER — SODIUM CHLORIDE 0.9 % IV BOLUS
1000.0000 mL | Freq: Once | INTRAVENOUS | Status: AC
  Administered 2021-10-25: 1000 mL via INTRAVENOUS

## 2021-10-25 MED ORDER — ONDANSETRON 4 MG PO TBDP
4.0000 mg | ORAL_TABLET | Freq: Once | ORAL | Status: AC
  Administered 2021-10-25: 4 mg via ORAL
  Filled 2021-10-25: qty 1

## 2021-10-25 MED ORDER — IOHEXOL 300 MG/ML  SOLN
100.0000 mL | Freq: Once | INTRAMUSCULAR | Status: AC | PRN
  Administered 2021-10-25: 100 mL via INTRAVENOUS

## 2021-10-25 MED ORDER — HYDROMORPHONE HCL 1 MG/ML IJ SOLN
1.0000 mg | Freq: Once | INTRAMUSCULAR | Status: AC
  Administered 2021-10-25: 1 mg via INTRAVENOUS
  Filled 2021-10-25: qty 1

## 2021-10-25 MED ORDER — METOCLOPRAMIDE HCL 5 MG/ML IJ SOLN
10.0000 mg | Freq: Once | INTRAMUSCULAR | Status: AC
  Administered 2021-10-25: 10 mg via INTRAVENOUS
  Filled 2021-10-25: qty 2

## 2021-10-25 MED ORDER — HYDROMORPHONE HCL 1 MG/ML IJ SOLN
0.5000 mg | Freq: Once | INTRAMUSCULAR | Status: AC
  Administered 2021-10-25: 0.5 mg via INTRAVENOUS
  Filled 2021-10-25: qty 1

## 2021-10-25 MED ORDER — OXYCODONE-ACETAMINOPHEN 5-325 MG PO TABS
1.0000 | ORAL_TABLET | Freq: Once | ORAL | Status: AC
  Administered 2021-10-25: 1 via ORAL
  Filled 2021-10-25: qty 1

## 2021-10-25 NOTE — ED Notes (Signed)
Pt given ginger ale.

## 2021-10-25 NOTE — Discharge Instructions (Addendum)
You were seen in the ER today for nausea and vomiting that is likely related to your chemotherapy.  Your labs look similar to prior blood work you have had done.  Your urine does have some blood in it, has recheck by her primary care provider.  Your CT scan did show findings of your known cancer.  You were given nausea medication in the emergency department with improvement.  Please take your at home nausea medication as needed.  Please follow-up very closely with your oncologist-call for soonest available follow-up appointment.  Return to the emergency department for new or worsening symptoms including but not limited to new or worsening pain, fever, inability to keep fluids down, blood in vomit or stool, inability to have a bowel movement, or any other concerns.

## 2021-10-25 NOTE — ED Notes (Signed)
E-signature pad unavailable at time of pt discharge. This RN discussed discharge materials with pt and answered all pt questions. Pt stated understanding of discharge material. ? ?

## 2021-10-25 NOTE — ED Provider Notes (Signed)
[facility] Provider Note   CSN: [id number] Arrival date & time: 10/24/21  2336     History Chief Complaint  Patient presents with   Emesis   Weakness    Leslie Mullen is a 60 y.o. female with a hx of stage 4 pancreatitic cancer, hypertension, hyperlipidemia, T2DM, paroxysmal afib on Eliquis, and asthma who presents to the ED with complaints of nausea and vomiting that began around noon 10/24/2021.  Patient states that she had her first chemotherapy session on the 17th, she was doing okay after this, however she subsequently developed nausea, vomiting, and continued abdominal pain.  She reports that she has had about 7 episodes of emesis.  She is having upper abdominal pain that is worse with vomiting, this is similar to the pain that she has been experiencing since her cancer diagnosis.  She tried taking Zofran and Compazine around midnight at home without much relief prompting emergency department visit.  She was again given Zofran by our triage nurse.  She remains uncomfortable.  She denies fever, hematemesis, melena, hematochezia, diarrhea, dysuria, or chest pain.  She reports her last bowel movement was 5 to 6 days ago, she does have chronic problems with constipation, she is still passing gas.  HPI     Past Medical History:  Diagnosis Date   Abnormal uterine bleeding    Allergy    Anemia    Asthma    Cataract    BOTH EYES   Complete heart block ([place])    a. [employer] PPM 10/2020.   Diabetes ([place])    type II    Diabetic neuropathy ([place])    Endometrial cancer ([place])    Family history of colon cancer    GERD (gastroesophageal reflux disease)    Hyperlipidemia    Hypertension    Morbid obesity ([place])    Osteoarthritis    right knee    Pancreatitis    Pneumonia     Patient Active Problem List   Diagnosis Date Noted   Family history of colon cancer 10/20/2021   Pancreatic cancer metastasized to liver ([place]) 10/14/2021   Pancreatic  mass    Pancreatitis 10/05/2021   Leukocytosis 10/05/2021   AKI (acute kidney injury) ([place]) 10/05/2021   Chronic anticoagulation 10/05/2021   Thrombocytosis 10/05/2021   Paroxysmal atrial fibrillation ([place]) 09/18/2021   Secondary hypercoagulable state ([place]) 09/18/2021   Endometrial cancer ([place]) 07/29/2021   Heart block 10/10/2020   Syncope 10/10/2020   Type 2 diabetes mellitus with diabetic polyneuropathy, with long-term current use of insulin ([place]) 01/31/2020   Type 2 diabetes mellitus with hyperglycemia, with long-term current use of insulin ([place]) 01/31/2020   Type 2 diabetes mellitus with stage 3a chronic kidney disease, with long-term current use of insulin ([place]) 01/31/2020   Abnormal feces    Benign neoplasm of ascending colon    Benign neoplasm of cecum    Benign neoplasm of transverse colon     Past Surgical History:  Procedure Laterality Date   CERVICAL BIOPSY     CESAREAN SECTION     x3   COLONOSCOPY WITH PROPOFOL N/A 06/29/2017   Procedure: COLONOSCOPY WITH PROPOFOL;  Surgeon: Irene Shipper, MD;  Location: [facility];  Service: Endoscopy;  Laterality: N/A;   CYSTECTOMY Right 2010   Cyst removed from right groin.    DILATION AND CURETTAGE OF UTERUS N/A 07/29/2021   Procedure: DILATATION AND CURETTAGE OF UTERUS;  Surgeon: Everitt Amber, MD;  Location: [facility];  Service:  Gynecology;  Laterality: N/A;   INTRAUTERINE DEVICE (IUD) INSERTION N/A 07/29/2021   Procedure: INTRAUTERINE DEVICE (IUD) INSERTION;  Surgeon: Everitt Amber, MD;  Location: [facility];  Service: Gynecology;  Laterality: N/A;  IUD FROM MAIN PHARMACY   PACEMAKER IMPLANT N/A 10/11/2020   Procedure: PACEMAKER IMPLANT;  Surgeon: Vickie Epley, MD;  Location: [facility];  Service: Cardiovascular;  Laterality: N/A;   TONSILLECTOMY       OB History   No obstetric history on file.     Family History  Adopted: Yes  Problem Relation Age of Onset   Diabetes Father    Heart disease Father    Cancer Mother         colon   Colon cancer Neg Hx    Esophageal cancer Neg Hx    Rectal cancer Neg Hx    Stomach cancer Neg Hx     Social History   Tobacco Use   Smoking status: Former    Types: Cigarettes   Smokeless tobacco: Never   Tobacco comments:    Former smoker (09/18/2021)  Vaping Use   Vaping Use: Never used  Substance Use Topics   Alcohol use: No    Alcohol/week: 0.0 standard drinks   Drug use: No    Home Medications Prior to Admission medications   Medication Sig Start Date End Date Taking? Authorizing Provider  amLODipine (NORVASC) 10 MG tablet Take 1 tablet (10 mg total) by mouth at bedtime. 01/03/21   [name], MD  apixaban (ELIQUIS) 5 MG TABS tablet Take 1 tablet (5 mg total) by mouth 2 (two) times daily. 09/18/21   Fenton, Clint R, PA  gabapentin (NEURONTIN) 300 MG capsule Take 1 capsule (300 mg total) by mouth 2 (two) times daily. Patient taking differently: Take 300 mg by mouth 3 (three) times daily. 10/12/20   Evans Lance, MD  glucose blood test strip  07/24/15   [provider]  insulin degludec (TRESIBA FLEXTOUCH) 200 UNIT/ML FlexTouch Pen Inject 20 Units into the skin every evening. Start at 20 units daily-if you blood glucose levels are still on the higher side-slowly increase back to your usual regimen of 80 units daily. 10/09/21   Ghimire, Henreitta Leber, MD  Insulin Pen Needle 31G X 5 MM MISC  08/01/15   [provider]  lidocaine-prilocaine (EMLA) cream Apply to affected area once 10/14/21   Truitt Merle, MD  loratadine (CLARITIN) 10 MG tablet Take 10 mg by mouth daily as needed for allergies.    [provider]  mirtazapine (REMERON) 7.5 MG tablet Take 1 tablet (7.5 mg total) by mouth at bedtime. 10/14/21   Truitt Merle, MD  omeprazole (PRILOSEC) 40 MG capsule Take 40 mg by mouth 2 (two) times daily. 09/05/21   [provider]  ondansetron (ZOFRAN ODT) 4 MG disintegrating tablet Take 1 tablet (4 mg total) by mouth every 8 (eight) hours  as needed for nausea or vomiting. 10/23/21   Truitt Merle, MD  ondansetron (ZOFRAN) 8 MG tablet Take 1 tablet (8 mg total) by mouth 2 (two) times daily as needed (Nausea or vomiting). 10/14/21   Truitt Merle, MD  [place] LANCETS 78M [place]  08/01/15   [provider]  Oxycodone HCl 10 MG TABS Take 1 tablet (10 mg total) by mouth every 6 (six) hours as needed. 10/14/21   Truitt Merle, MD  polyethylene glycol (MIRALAX / GLYCOLAX) 17 g packet Take 17 g by mouth daily as needed for moderate  constipation.    [provider]  pravastatin (PRAVACHOL) 40 MG tablet TAKE 1 TABLET(40 MG) BY MOUTH DAILY 07/23/20   [name], MD  prochlorperazine (COMPAZINE) 10 MG tablet Take 1 tablet (10 mg total) by mouth every 6 (six) hours as needed for nausea or vomiting. 10/10/21   Truitt Merle, MD  prochlorperazine (COMPAZINE) 10 MG tablet Take 1 tablet (10 mg total) by mouth every 6 (six) hours as needed (Nausea or vomiting). 10/14/21   Truitt Merle, MD  SYNJARDY XR 12.04-999 MG TB24 Take 1 tablet by mouth 2 (two) times daily. 05/30/21   [provider]  trolamine salicylate (ASPERCREME) 10 % cream Apply 1 application topically 2 (two) times daily as needed for muscle pain.    [provider]    Allergies    Penicillins  Review of Systems   Review of Systems  Constitutional:  Negative for fever.  Cardiovascular:  Negative for chest pain.  Gastrointestinal:  Positive for abdominal pain, constipation, nausea and vomiting. Negative for blood in stool and diarrhea.  Genitourinary:  Negative for dysuria.  Neurological:  Negative for syncope.  All other systems reviewed and are negative.  Physical Exam Updated Vital Signs BP (!) 145/81   Pulse (!) 111   Temp 98.9 F (37.2 C) (Oral)   Resp 16   SpO2 100%   Physical Exam Vitals and nursing note reviewed.  Constitutional:      General: She is not in acute distress.    Appearance: She is well-developed. She is not toxic-appearing.   HENT:     Head: Normocephalic and atraumatic.  Eyes:     General:        Right eye: No discharge.        Left eye: No discharge.     Conjunctiva/sclera: Conjunctivae normal.  Cardiovascular:     Rate and Rhythm: Regular rhythm. Tachycardia present.  Pulmonary:     Effort: Pulmonary effort is normal. No respiratory distress.     Breath sounds: Normal breath sounds. No wheezing, rhonchi or rales.  Abdominal:     General: There is no distension.     Palpations: Abdomen is soft.     Tenderness: There is abdominal tenderness (upper abdomen). There is no guarding or rebound.  Musculoskeletal:     Cervical back: Neck supple.  Skin:    General: Skin is warm and dry.     Findings: No rash.  Neurological:     Mental Status: She is alert.     Comments: Clear speech.   Psychiatric:        Behavior: Behavior normal.    ED Results / Procedures / Treatments   Labs (all labs ordered are listed, but only abnormal results are displayed) Labs Reviewed  CBG MONITORING, ED - Abnormal; Notable for the following components:      Result Value   Glucose-Capillary 153 (*)    All other components within normal limits  LIPASE, BLOOD  COMPREHENSIVE METABOLIC PANEL  CBC  URINALYSIS, ROUTINE W REFLEX MICROSCOPIC  I-STAT BETA HCG BLOOD, ED ([facility], [facility], [facility] ONLY)    EKG None  Radiology CT Abdomen Pelvis W Contrast  Result Date: 10/25/2021 CLINICAL DATA:  Known pancreatic mass with nausea and vomiting and abdominal pain, initial encounter EXAM: CT ABDOMEN AND PELVIS WITH CONTRAST TECHNIQUE: Multidetector CT imaging of the abdomen and pelvis was performed using the standard protocol following bolus administration of intravenous contrast. CONTRAST:  123mL OMNIPAQUE IOHEXOL 300 MG/ML  SOLN COMPARISON:  10/05/2021 FINDINGS: Lower chest: Patchy right lower lobe infiltrate consistent with early pneumonia. Hepatobiliary: Gallbladder is within normal limits. Liver is well visualized with scattered hypodensities  the largest of which lies in the lateral segment of the left lobe of the liver consistent with known metastatic disease. Pancreas: Pancreas again demonstrates a large centrally necrotic mass lesion which measures approximately 6 x 5.3 cm in greatest dimension. This appears larger than that seen on prior study. The more peripheral pancreas is unremarkable. Spleen: Normal in size without focal abnormality. Adrenals/Urinary Tract: Adrenal glands are within normal limits. Kidneys demonstrate no renal calculi or obstructive changes. The ureters are within normal limits. Bladder is partially distended. Stomach/Bowel: Appendix is within normal limits. No obstructive or inflammatory changes of the colon are seen. Small bowel and stomach are unremarkable. Vascular/Lymphatic: Aortic atherosclerosis. A gastrohepatic ligament lymph node is noted which measures 10 mm in short axis. Portacaval node measuring 12 mm in short axis is noted. Reproductive: Uterus and bilateral adnexa are unremarkable. IUD is noted in place. Other: Small fat containing umbilical hernia stable from the prior study. Musculoskeletal: Degenerative changes of lumbar spine. IMPRESSION: Persistent pancreatic head mass which appears slightly larger than that seen on prior MRI now measuring up to 6 cm in greatest dimension. Some adjacent lymphadenopathy is noted as well. Hypodensities within the liver which correspond to that seen on prior MRI consistent with metastatic disease largest of which is noted in the left lobe of the liver. No other focal abnormality is noted. Electronically Signed   By: Inez Catalina M.D.   On: 10/25/2021 03:22    Procedures Procedures   Medications Ordered in ED Medications  ondansetron (ZOFRAN-ODT) disintegrating tablet 4 mg (4 mg Oral Given 10/25/21 0015)    ED Course  I have reviewed the triage vital signs and the nursing notes.  Pertinent labs & imaging results that were available during my care of the patient were  reviewed by me and considered in my medical decision making (see chart for details).    MDM Rules/Calculators/A&P                           Patient presents to the ED with complaints of N/V.  Known pancreatitis cancer, first chemotherapy tx yesterday. Received zofran x 2 and compazine prior to my assessment. Upper abdomen is tender to palpation w/o peritoneal signs. Plan for pain medicine, likely anti-emetics as well but just received some shortly ago, will check EKG to check Qtc.   EKG Qtc < 500.   Additional history obtained:  Additional history obtained from chart review & nursing note review.   Lab Tests:  I reviewed and interpreted labs, which included:  CBC: mild leukocytosis similar to prior.  CMP: renal function similar to prior.  Lipase: mildly elevated- improved from prior UA: blood present no UTI.  Preg test: Negative.   Imaging Studies ordered:  I ordered imaging studies which included CT A/P, I independently reviewed, formal radiology impression shows: Persistent pancreatic head mass which appears slightly larger than that seen on prior MRI now measuring up to 6 cm in greatest dimension. Some adjacent lymphadenopathy is noted as well. Hypodensities within the liver which correspond to that seen on prior MRI consistent with metastatic disease largest of which is noted in the left lobe of the liver. No other focal abnormality is noted.   ED Course:  On re-assessment patient has had some mild improvement but remains uncomfortable with persistent  nausea- reglan & dilaudid ordered.   05:45: RE-EVAL: tolerating PO remains with mild nausea- will given ativan.   Patient feeling improved, tolerating PO, labs similar to prior and CT without acute surgical process at this time. Repeat abdominal exam w/o peritoneal signs. Likely chemotherapy induced N/V, overall appears reasonable for discharge home. I discussed results, treatment plan, need for follow-up, and return precautions with  the patient. Provided opportunity for questions, patient confirmed understanding and is in agreement with plan.   Findings and plan of care discussed with supervising physician Dr. Betsey Holiday who has evaluated patient is shared visit, in agreement.  Portions of this note were generated with [employer]. Dictation errors may occur despite best attempts at proofreading.  Final Clinical Impression(s) / ED Diagnoses Final diagnoses:  Nausea and vomiting, unspecified vomiting type    Rx / DC Orders ED Discharge Orders     None        Amaryllis Dyke, PA-C 10/25/21 0710    Orpah Greek, MD 10/26/21 [phone]

## 2021-10-25 NOTE — ED Notes (Signed)
Patient transported to CT 

## 2021-10-28 ENCOUNTER — Inpatient Hospital Stay (HOSPITAL_BASED_OUTPATIENT_CLINIC_OR_DEPARTMENT_OTHER): Payer: 59 | Admitting: Hematology

## 2021-10-28 DIAGNOSIS — C787 Secondary malignant neoplasm of liver and intrahepatic bile duct: Secondary | ICD-10-CM | POA: Diagnosis not present

## 2021-10-28 DIAGNOSIS — C259 Malignant neoplasm of pancreas, unspecified: Secondary | ICD-10-CM

## 2021-10-28 NOTE — Progress Notes (Signed)
[place]   [phone]) [phone] [phone]) [phone]   Clinic Follow up Note   Patient Care Team: Elinor Parkinson as PCP - General (Physician Assistant) Vickie Epley, MD as PCP - Electrophysiology (Cardiology) Josue Hector, MD as PCP - Cardiology (Cardiology)  Date of Service:  10/29/2021  I connected with Leslie Mullen on 10/29/2021 at  1:00 PM EST by telephone visit and verified that I am speaking with the correct person using two identifiers.  I discussed the limitations, risks, security and privacy concerns of performing an evaluation and management service by telephone and the availability of in person appointments. I also discussed with the patient that there may be a patient responsible charge related to this service. The patient expressed understanding and agreed to proceed.   Other persons participating in the visit and their role in the encounter:  none  Patient's location:  home Provider's location:  my office  CHIEF COMPLAINT: f/u of metastatic pancreatic cancer  CURRENT THERAPY:  Gemcitabine/Abraxane, started 10/23/21  ASSESSMENT & PLAN:  Leslie Mullen is a 60 y.o. female with   1. Pancreatic cancer metastasized to liver, stage IV  -initially presented with abdominal/epigastric pain. [place] was performed at her first ED visit on 09/24/21 showing a 5.3 cm lesion adjacent to pancreatic head. Follow up CT AP showed 4.1 cm lesion in pancreatic head. She was recommended to f/u with GI. -she returned to ED on 10/05/21 and was admitted. Abd MRI showed findings of pancreatic neoplasm with hepatic metastatic disease, local nodal disease likely, local invasion of stomach and duodenum suspected, and mild stranding about pancreas. -liver biopsy was performed 10/08/21 and confirmed poorly differentiated carcinoma, compatible with a pancreatobiliary primary. I reviewed with pt and her daughter today  -staging chest CT 10/14/21 was negative for metastatic  disease. -she started gemcitabine/abraxane on 10/23/21. She tolerated rather poorly with a lot of nausea and vomiting, prompting ED visit. -CT AP performed in ED 10/25/21 showed slight enlargement to pancreatic head mass, now 6 cm, with adjacent lymphadenopathy and liver metastasis. -I discussed adjustments to her premeds to help prevent nausea and nausea management at home. I will also reduce her abraxane dose for this Friday. -I advised her to reach out to IR about port placement, she believes they tried to reach her daughter.   2. Goal of care discussion, Social support -We discussed the incurable nature of her cancer, and the overall poor prognosis, especially if she does not have good response to chemotherapy or progress on chemo -The patient understands the goal of care is palliative. -she is full code now  -I will refer them to our social worker, she previously applied for disability but was denied, she would like to appeal.   3. Symptom Management: Nausea, Loss of appetite, Epigastric pain, Fatigue -she is on compazine for nausea and oxycodone for pain. I refilled her oxycodone today. -she reports loss of appetite. I will prescribe mirtazapine for her today. I will also refer her to nutritionist. -we will check her iron levels at her next visit.     PLAN: -lab and gem/abraxane on 11/25, will add aloxi and slightly reduce abraxane dose on 11/25 due to her nausea  -IR port placement in next 2 weeks  -f/u on C2D1    No problem-specific Assessment & Plan notes found for this encounter.    SUMMARY OF ONCOLOGIC HISTORY: Oncology History Overview Note  Cancer Staging Pancreatic cancer metastasized to liver [facility]) Staging form: Exocrine Pancreas, AJCC  8th Edition - Clinical stage from 10/14/2021: Stage IV (cT3, cN1, pM1) - Signed by Truitt Merle, MD on 10/14/2021    Pancreatic cancer metastasized to liver ([place])  09/24/2021 Imaging   EXAM: ULTRASOUND ABDOMEN LIMITED RIGHT UPPER  QUADRANT  IMPRESSION: 5.3 cm lesion adjacent to the pancreatic head. While this may reflect a loop of bowel, a mass is not excluded. Consider CT abdomen with contrast for further evaluation.   09/25/2021 Imaging   EXAM: CT ABDOMEN AND PELVIS WITH CONTRAST  IMPRESSION: 4.1 cm low-density lesion in the pancreatic head/body, possibly reflecting focal pancreatitis given the clinical history. However, this warrants short-term follow-up in 4-6 weeks to document improvement/resolution. Alternatively, EUS could be considered for tissue sampling as clinically warranted.   10/06/2021 Imaging   EXAM: MRI ABDOMEN WITHOUT AND WITH CONTRAST  IMPRESSION: Findings of pancreatic neoplasm with hepatic metastatic disease, local nodal disease likely with greater than 180 degree involvement of the portal vein, sampling of LEFT hepatic lobe lesion may be helpful to initiate further workup. Subsequent local staging could be performed with pancreatic protocol CT and EUS as indicated.   Local invasion of stomach and duodenum are suspected.   Mild stranding about the pancreas could be indicative of mild superimposed pancreatitis or local tumor extension particularly into the transverse mesocolon.   10/08/2021 Pathology Results   FINAL MICROSCOPIC DIAGNOSIS:   A. LIVER,  LEFT LOBE, BIOPSY:  -  Poorly differentiated carcinoma  -  See comment   COMMENT:  By immunohistochemistry, the neoplastic cells are positive for  cytokeratin 7 and weakly positive for HepPar.  The cells are negative for cytokeratin 20, GATA3, arginase, PAX8, CDX2 and TTF-1.  The immune O profile is nonspecific; however, the findings are compatible with a pancreatobiliary primary in the absence of other lesions.   10/14/2021 Initial Diagnosis   Pancreatic cancer metastasized to liver [facility])   10/14/2021 Cancer Staging   Staging form: Exocrine Pancreas, AJCC 8th Edition - Clinical stage from 10/14/2021: Stage IV (cT3, cN1, pM1) -  Signed by Truitt Merle, MD on 10/14/2021 Stage prefix: Initial diagnosis    10/23/2021 -  Chemotherapy   Patient is on Treatment Plan : PANCREATIC Abraxane / Gemcitabine D1,8,15 q28d        INTERVAL HISTORY:  Leslie Mullen was contacted for a follow up of metastatic pancreatic cancer. She was last seen by me on 10/14/21. She went to the ED the day following infusion with nausea and vomiting. She reports it started that day and lasted for about a day and a half after. She notes she is recovering and is able to eat better. She reports she still experiences nausea but denies vomiting.   All other systems were reviewed with the patient and are negative.  MEDICAL HISTORY:  Past Medical History:  Diagnosis Date   Abnormal uterine bleeding    Allergy    Anemia    Asthma    Cataract    BOTH EYES   Complete heart block ([place])    a. Medtr[employer] PPM 10/2020.   Diabetes ([place])    type II    Diabetic neuropathy ([place])    Endometrial cancer ([place])    Family history of colon cancer    GERD (gastroesophageal reflux disease)    Hyperlipidemia    Hypertension    Morbid obesity ([place])    Osteoarthritis    right knee    Pancreatitis    Pneumonia     SURGICAL HISTORY: Past Surgical History:  Procedure Laterality Date  CERVICAL BIOPSY     CESAREAN SECTION     x3   COLONOSCOPY WITH PROPOFOL N/A 06/29/2017   Procedure: COLONOSCOPY WITH PROPOFOL;  Surgeon: Irene Shipper, MD;  Location: [facility] ENDOSCOPY;  Service: Endoscopy;  Laterality: N/A;   CYSTECTOMY Right 2010   Cyst removed from right groin.    DILATION AND CURETTAGE OF UTERUS N/A 07/29/2021   Procedure: DILATATION AND CURETTAGE OF UTERUS;  Surgeon: Everitt Amber, MD;  Location: [facility] ORS;  Service: Gynecology;  Laterality: N/A;   INTRAUTERINE DEVICE (IUD) INSERTION N/A 07/29/2021   Procedure: INTRAUTERINE DEVICE (IUD) INSERTION;  Surgeon: Everitt Amber, MD;  Location: [facility] ORS;  Service: Gynecology;  Laterality: N/A;  IUD FROM MAIN PHARMACY   PACEMAKER  IMPLANT N/A 10/11/2020   Procedure: PACEMAKER IMPLANT;  Surgeon: Vickie Epley, MD;  Location: [facility] CV LAB;  Service: Cardiovascular;  Laterality: N/A;   TONSILLECTOMY      I have reviewed the social history and family history with the patient and they are unchanged from previous note.  ALLERGIES:  is allergic to penicillins.  MEDICATIONS:  Current Outpatient Medications  Medication Sig Dispense Refill   amLODipine (NORVASC) 10 MG tablet Take 1 tablet (10 mg total) by mouth at bedtime. 30 tablet 0   apixaban (ELIQUIS) 5 MG TABS tablet Take 1 tablet (5 mg total) by mouth 2 (two) times daily. 60 tablet 3   gabapentin (NEURONTIN) 300 MG capsule Take 1 capsule (300 mg total) by mouth 2 (two) times daily. (Patient taking differently: Take 300 mg by mouth 3 (three) times daily.)     glucose blood test strip      insulin degludec (TRESIBA FLEXTOUCH) 200 UNIT/ML FlexTouch Pen Inject 20 Units into the skin every evening. Start at 20 units daily-if you blood glucose levels are still on the higher side-slowly increase back to your usual regimen of 80 units daily.     Insulin Pen Needle 31G X 5 MM MISC      lidocaine-prilocaine (EMLA) cream Apply to affected area once 30 g 3   loratadine (CLARITIN) 10 MG tablet Take 10 mg by mouth daily as needed for allergies.     mirtazapine (REMERON) 7.5 MG tablet Take 1 tablet (7.5 mg total) by mouth at bedtime. 30 tablet 0   omeprazole (PRILOSEC) 40 MG capsule Take 40 mg by mouth 2 (two) times daily.     ondansetron (ZOFRAN ODT) 4 MG disintegrating tablet Take 1 tablet (4 mg total) by mouth every 8 (eight) hours as needed for nausea or vomiting. 20 tablet 1   ondansetron (ZOFRAN) 8 MG tablet Take 1 tablet (8 mg total) by mouth 2 (two) times daily as needed (Nausea or vomiting). 30 tablet 1   ONETOUCH DELICA LANCETS 64P MISC      Oxycodone HCl 10 MG TABS Take 1 tablet (10 mg total) by mouth every 6 (six) hours as needed. 90 tablet 0   polyethylene glycol  (MIRALAX / GLYCOLAX) 17 g packet Take 17 g by mouth daily as needed for moderate constipation.     pravastatin (PRAVACHOL) 40 MG tablet TAKE 1 TABLET(40 MG) BY MOUTH DAILY 90 tablet 1   prochlorperazine (COMPAZINE) 10 MG tablet Take 1 tablet (10 mg total) by mouth every 6 (six) hours as needed for nausea or vomiting. 30 tablet 1   prochlorperazine (COMPAZINE) 10 MG tablet Take 1 tablet (10 mg total) by mouth every 6 (six) hours as needed (Nausea or vomiting). 30 tablet 1   SYNJARDY  XR 12.04-999 MG TB24 Take 1 tablet by mouth 2 (two) times daily.     trolamine salicylate (ASPERCREME) 10 % cream Apply 1 application topically 2 (two) times daily as needed for muscle pain.     No current facility-administered medications for this visit.    PHYSICAL EXAMINATION: ECOG PERFORMANCE STATUS: 2 - Symptomatic, <50% confined to bed  There were no vitals filed for this visit. Wt Readings from Last 3 Encounters:  10/14/21 (!) 330 lb 14.4 oz (150.1 kg)  10/05/21 (!) 336 lb 10.3 oz (152.7 kg)  09/18/21 (!) 350 lb (158.8 kg)     No vitals taken today, Exam not performed today  LABORATORY DATA:  I have reviewed the data as listed CBC Latest Ref Rng & Units 10/25/2021 10/23/2021 10/20/2021  WBC 4.0 - 10.5 K/uL 14.8(H) 15.6(H) 15.1(H)  Hemoglobin 12.0 - 15.0 g/dL 9.2(L) 9.0(L) 8.9(L)  Hematocrit 36.0 - 46.0 % 30.8(L) 29.6(L) 29.7(L)  Platelets 150 - 400 K/uL 412(H) 402(H) 347     CMP Latest Ref Rng & Units 10/25/2021 10/23/2021 10/20/2021  Glucose 70 - 99 mg/dL 184(H) 189(H) 151(H)  BUN 6 - 20 mg/dL 13 14 13   Creatinine 0.44 - 1.00 mg/dL 1.47(H) 1.73(H) 1.89(H)  Sodium 135 - 145 mmol/L 140 143 143  Potassium 3.5 - 5.1 mmol/L 4.1 5.0 3.9  Chloride 98 - 111 mmol/L 103 104 105  CO2 22 - 32 mmol/L 25 30 28   Calcium 8.9 - 10.3 mg/dL 8.6(L) 9.3 8.7(L)  Total Protein 6.5 - 8.1 g/dL 7.7 7.8 7.8  Total Bilirubin 0.3 - 1.2 mg/dL 0.6 0.2(L) 0.2(L)  Alkaline Phos 38 - 126 U/L 70 72 80  AST 15 - 41 U/L 18  10(L) 13(L)  ALT 0 - 44 U/L 11 7 7       RADIOGRAPHIC STUDIES: I have personally reviewed the radiological images as listed and agreed with the findings in the report. No results found.    No orders of the defined types were placed in this encounter.  All questions were answered. The patient knows to call the clinic with any problems, questions or concerns. No barriers to learning was detected. The total time spent in the appointment was 22 minutes.     Truitt Merle, MD 10/29/2021   I, Wilburn Mylar, am acting as scribe for Truitt Merle, MD.   I have reviewed the above documentation for accuracy and completeness, and I agree with the above.

## 2021-10-29 ENCOUNTER — Encounter: Payer: Self-pay | Admitting: Hematology

## 2021-10-31 ENCOUNTER — Inpatient Hospital Stay: Payer: 59 | Admitting: Dietician

## 2021-10-31 ENCOUNTER — Inpatient Hospital Stay: Payer: 59

## 2021-10-31 ENCOUNTER — Telehealth: Payer: Self-pay | Admitting: Hematology

## 2021-10-31 ENCOUNTER — Other Ambulatory Visit: Payer: Self-pay

## 2021-10-31 VITALS — BP 143/71 | HR 82 | Temp 97.8°F | Resp 16 | Wt 319.2 lb

## 2021-10-31 DIAGNOSIS — C259 Malignant neoplasm of pancreas, unspecified: Secondary | ICD-10-CM

## 2021-10-31 DIAGNOSIS — C787 Secondary malignant neoplasm of liver and intrahepatic bile duct: Secondary | ICD-10-CM

## 2021-10-31 DIAGNOSIS — Z5111 Encounter for antineoplastic chemotherapy: Secondary | ICD-10-CM | POA: Diagnosis not present

## 2021-10-31 LAB — CBC WITH DIFFERENTIAL (CANCER CENTER ONLY)
Abs Immature Granulocytes: 0.04 10*3/uL (ref 0.00–0.07)
Basophils Absolute: 0 10*3/uL (ref 0.0–0.1)
Basophils Relative: 0 %
Eosinophils Absolute: 0.1 10*3/uL (ref 0.0–0.5)
Eosinophils Relative: 1 %
HCT: 25.2 % — ABNORMAL LOW (ref 36.0–46.0)
Hemoglobin: 7.6 g/dL — ABNORMAL LOW (ref 12.0–15.0)
Immature Granulocytes: 0 %
Lymphocytes Relative: 21 %
Lymphs Abs: 1.9 10*3/uL (ref 0.7–4.0)
MCH: 25.9 pg — ABNORMAL LOW (ref 26.0–34.0)
MCHC: 30.2 g/dL (ref 30.0–36.0)
MCV: 85.7 fL (ref 80.0–100.0)
Monocytes Absolute: 0.5 10*3/uL (ref 0.1–1.0)
Monocytes Relative: 6 %
Neutro Abs: 6.4 10*3/uL (ref 1.7–7.7)
Neutrophils Relative %: 72 %
Platelet Count: 243 10*3/uL (ref 150–400)
RBC: 2.94 MIL/uL — ABNORMAL LOW (ref 3.87–5.11)
RDW: 13.5 % (ref 11.5–15.5)
WBC Count: 9 10*3/uL (ref 4.0–10.5)
nRBC: 0.3 % — ABNORMAL HIGH (ref 0.0–0.2)

## 2021-10-31 LAB — CMP (CANCER CENTER ONLY)
ALT: 11 U/L (ref 0–44)
AST: 15 U/L (ref 15–41)
Albumin: 1.7 g/dL — ABNORMAL LOW (ref 3.5–5.0)
Alkaline Phosphatase: 71 U/L (ref 38–126)
Anion gap: 12 (ref 5–15)
BUN: 15 mg/dL (ref 6–20)
CO2: 23 mmol/L (ref 22–32)
Calcium: 8.6 mg/dL — ABNORMAL LOW (ref 8.9–10.3)
Chloride: 106 mmol/L (ref 98–111)
Creatinine: 1.55 mg/dL — ABNORMAL HIGH (ref 0.44–1.00)
GFR, Estimated: 38 mL/min — ABNORMAL LOW (ref >60)
Glucose, Bld: 112 mg/dL — ABNORMAL HIGH (ref 70–99)
Potassium: 4 mmol/L (ref 3.5–5.1)
Sodium: 141 mmol/L (ref 135–145)
Total Bilirubin: 0.2 mg/dL — ABNORMAL LOW (ref 0.3–1.2)
Total Protein: 7.4 g/dL (ref 6.5–8.1)

## 2021-10-31 MED ORDER — SODIUM CHLORIDE 0.9 % IV SOLN
Freq: Once | INTRAVENOUS | Status: AC
Start: 2021-10-31 — End: 2021-10-31

## 2021-10-31 MED ORDER — SODIUM CHLORIDE 0.9 % IV SOLN: Freq: Once | INTRAVENOUS | Status: AC

## 2021-10-31 MED ORDER — PALONOSETRON HCL INJECTION 0.25 MG/5ML
0.2500 mg | Freq: Once | INTRAVENOUS | Status: AC
  Administered 2021-10-31: 0.25 mg via INTRAVENOUS
  Filled 2021-10-31: qty 5

## 2021-10-31 MED ORDER — PACLITAXEL PROTEIN-BOUND CHEMO INJECTION 100 MG
100.0000 mg/m2 | Freq: Once | INTRAVENOUS | Status: AC
  Administered 2021-10-31: 275 mg via INTRAVENOUS
  Filled 2021-10-31: qty 55

## 2021-10-31 MED ORDER — PROCHLORPERAZINE MALEATE 10 MG PO TABS
10.0000 mg | ORAL_TABLET | Freq: Once | ORAL | Status: AC
  Administered 2021-10-31: 10 mg via ORAL
  Filled 2021-10-31: qty 1

## 2021-10-31 MED ORDER — SODIUM CHLORIDE 0.9 % IV SOLN
1000.0000 mg/m2 | Freq: Once | INTRAVENOUS | Status: AC
  Administered 2021-10-31: 2698 mg via INTRAVENOUS
  Filled 2021-10-31: qty 70.96

## 2021-10-31 NOTE — Patient Instructions (Signed)
[facility] ONCOLOGY  Discharge Instructions: Thank you for choosing [facility] to provide your oncology and hematology care.   If you have a lab appointment with the [facility], please go directly to the [facility] and check in at the registration area.   Wear comfortable clothing and clothing appropriate for easy access to any Portacath or PICC line.   We strive to give you quality time with your provider. You may need to reschedule your appointment if you arrive late (15 or more minutes).  Arriving late affects you and other patients whose appointments are after yours.  Also, if you miss three or more appointments without notifying the office, you may be dismissed from the clinic at the provider's discretion.      For prescription refill requests, have your pharmacy contact our office and allow 72 hours for refills to be completed.    Today you received the following chemotherapy and/or immunotherapy agents: Abraxane & Gemzar   To help prevent nausea and vomiting after your treatment, we encourage you to take your nausea medication as directed.  BELOW ARE SYMPTOMS THAT SHOULD BE REPORTED IMMEDIATELY: *FEVER GREATER THAN 100.4 F (38 C) OR HIGHER *CHILLS OR SWEATING *NAUSEA AND VOMITING THAT IS NOT CONTROLLED WITH YOUR NAUSEA MEDICATION *UNUSUAL SHORTNESS OF BREATH *UNUSUAL BRUISING OR BLEEDING *URINARY PROBLEMS (pain or burning when urinating, or frequent urination) *BOWEL PROBLEMS (unusual diarrhea, constipation, pain near the anus) TENDERNESS IN MOUTH AND THROAT WITH OR WITHOUT PRESENCE OF ULCERS (sore throat, sores in mouth, or a toothache) UNUSUAL RASH, SWELLING OR PAIN  UNUSUAL VAGINAL DISCHARGE OR ITCHING   Items with * indicate a potential emergency and should be followed up as soon as possible or go to the Emergency Department if any problems should occur.  Please show the CHEMOTHERAPY ALERT CARD or IMMUNOTHERAPY ALERT CARD at  check-in to the Emergency Department and triage nurse.  Should you have questions after your visit or need to cancel or reschedule your appointment, please contact [facility]  Dept: [phone]  and follow the prompts.  Office hours are 8:00 a.m. to 4:30 p.m. Monday - Friday. Please note that voicemails left after 4:00 p.m. may not be returned until the following business day.  We are closed weekends and major holidays. You have access to a nurse at all times for urgent questions. Please call the main number to the clinic Dept: [phone] and follow the prompts.   For any non-urgent questions, you may also contact your provider using MyChart. We now offer e-Visits for anyone 82 and older to request care online for non-urgent symptoms. For details visit [email or web address].   Also download the MyChart app! Go to the app store, search "MyChart", open the app, select [facility], and log in with your MyChart username and password.  Due to Covid, a mask is required upon entering the hospital/clinic. If you do not have a mask, one will be given to you upon arrival. For doctor visits, patients may have 1 support person aged 19 or older with them. For treatment visits, patients cannot have anyone with them due to current Covid guidelines and our immunocompromised population.

## 2021-10-31 NOTE — Progress Notes (Signed)
VAST consult received to obtain IV access. Pt's lower arms assessed bilaterally with ultrasound. No appropriate vessels noted in lower left arm; brachial vein in AC and cephalic vein in left upper arm not compressible and therefore not appropriate. One small vessel visualized in patient's right lower arm; 24G PIV placed using [place]. Upper right arm was not assessed.  Patient reported she is awaiting a call about scheduling port placement. This RN suggested she speak with her physician about getting port as soon as possible d/t lack of vasculature in her arms. Infusion nurse center suggested she might need PICC placement while awaiting port placement.

## 2021-10-31 NOTE — Telephone Encounter (Signed)
Scheduled follow-up appointments per 11/22 los. Patient is aware. 

## 2021-10-31 NOTE — Progress Notes (Signed)
Pt took at home PO Tylenol 650 mg prior to her appt today for pain 7/10

## 2021-10-31 NOTE — Progress Notes (Signed)
Ok to trt w/ Hgb 7.6 & SrCr 1.55 per Dr. Lorenso Courier

## 2021-10-31 NOTE — Progress Notes (Signed)
Nutrition Follow-up:  Patient receiving Gemcitabine/Abraxane for stage IV pancreatic cancer.   Met with patient during infusion. She reports appetite has not improved much, eating a "nibble here and there." She tried Ensure Complete, but she did not tolerate this. Yesterday was a good day for her and ate more than she has been recently. Patient recalls cup of tapioca pudding and couple of fruit bars in the morning. She had a dab of yams, cabbage, collards, mac/cheese, and a few bites of chicken, roast, and dressing. She had a piece of sandcake and drank trash can punch (ginger ale, hawaiian punch, grape juice, pineapple juice). Patient reports nausea has improved. She is taking compazine and zofran as prescribed. Patient reports regular bowel movements every other day.      Medications: Aloxi, Zofran, Compazine, Remeron, Oxycodone  Labs: Glucose 112, Cr 1.55  Anthropometrics: Weight 319 lb 4 oz today decreased 11.7 lbs (3.5%) in 17 days. This is significant.   11/8 - 330 lb 14.4 oz   NUTRITION DIAGNOSIS: Inadequate oral intake continues   INTERVENTION:  Reinforced importance of adequate calories and protein energy intake to maintain weights, strength, nutrition  Encouraged small frequent meals and snacks with adequate calories and protein - handout with snack ideas and shake recipes provided Suggested trying Boost Plus or CIB mixed with whole milk as oral supplement, recommend pt drink 2-3 day Sample packet of CIB powder and Boost coupons provided  Continue taking nausea medication as prescribed per MD     MONITORING, EVALUATION, GOAL: weight trends, intake   NEXT VISIT: Tuesday December 20 during infusion

## 2021-11-05 ENCOUNTER — Telehealth: Payer: Self-pay | Admitting: Licensed Clinical Social Worker

## 2021-11-05 ENCOUNTER — Encounter: Payer: Self-pay | Admitting: Hematology

## 2021-11-06 ENCOUNTER — Telehealth: Payer: Self-pay

## 2021-11-06 ENCOUNTER — Other Ambulatory Visit: Payer: Self-pay

## 2021-11-06 DIAGNOSIS — K1379 Other lesions of oral mucosa: Secondary | ICD-10-CM

## 2021-11-06 MED ORDER — MAGIC MOUTHWASH W/LIDOCAINE
5.0000 mL | Freq: Four times a day (QID) | ORAL | 2 refills | Status: DC
Start: 2021-11-06 — End: 2022-01-05

## 2021-11-06 NOTE — Telephone Encounter (Signed)
This nurse attempted to reach patient by phone times two related to order for magic mouthwash being called in for her per provider.  No answer. This nurse will reply back to patients My Chart message.  No further concerns at this time.

## 2021-11-07 ENCOUNTER — Telehealth: Payer: Self-pay

## 2021-11-07 NOTE — Telephone Encounter (Signed)
MDT alert for AF >= 6hrs in one day Presenting rhythm is AF starting 12/1 @ 15:08 and is ongoing Poor rate control in AF, burden 1.8% Pt was seen in the AF clinic 10/13, Eliquis was started There are 17 fast AV showing AF with RVR, 2 NSVT brief SVT  Attempted to reach pt to assess symptoms and med compliance, no answer.  LVM with device clinic # and hours.

## 2021-11-10 ENCOUNTER — Other Ambulatory Visit: Payer: Self-pay | Admitting: Internal Medicine

## 2021-11-10 ENCOUNTER — Other Ambulatory Visit: Payer: Self-pay | Admitting: Student

## 2021-11-10 NOTE — Telephone Encounter (Signed)
MyChart Message sent to patient.

## 2021-11-11 ENCOUNTER — Ambulatory Visit (HOSPITAL_COMMUNITY)
Admission: RE | Admit: 2021-11-11 | Discharge: 2021-11-11 | Disposition: A | Discharge status: Home / Self Care | Payer: 59 | Source: Ambulatory Visit | Attending: Hematology | Admitting: Hematology

## 2021-11-11 ENCOUNTER — Other Ambulatory Visit: Payer: Self-pay

## 2021-11-11 ENCOUNTER — Encounter (HOSPITAL_COMMUNITY): Payer: Self-pay

## 2021-11-11 DIAGNOSIS — C259 Malignant neoplasm of pancreas, unspecified: Secondary | ICD-10-CM | POA: Diagnosis present

## 2021-11-11 DIAGNOSIS — C787 Secondary malignant neoplasm of liver and intrahepatic bile duct: Secondary | ICD-10-CM | POA: Insufficient documentation

## 2021-11-11 HISTORY — PX: IR IMAGING GUIDED PORT INSERTION: IMG5740

## 2021-11-11 LAB — GLUCOSE, CAPILLARY: Glucose-Capillary: 253 mg/dL — ABNORMAL HIGH (ref 70–99)

## 2021-11-11 MED ORDER — SODIUM CHLORIDE 0.9 % IV SOLN: INTRAVENOUS | Status: DC

## 2021-11-11 MED ORDER — HEPARIN SOD (PORK) LOCK FLUSH 100 UNIT/ML IV SOLN
INTRAVENOUS | Status: AC | PRN
  Administered 2021-11-11: 500 [IU] via INTRAVENOUS

## 2021-11-11 MED ORDER — FENTANYL CITRATE (PF) 100 MCG/2ML IJ SOLN
INTRAMUSCULAR | Status: AC | PRN
  Administered 2021-11-11: 50 ug via INTRAVENOUS

## 2021-11-11 MED ORDER — LIDOCAINE HCL (PF) 1 % IJ SOLN
INTRAMUSCULAR | Status: AC | PRN
  Administered 2021-11-11: 10 mL via INTRADERMAL

## 2021-11-11 MED ORDER — MIDAZOLAM HCL 2 MG/2ML IJ SOLN
INTRAMUSCULAR | Status: AC | PRN
  Administered 2021-11-11: 1 mg via INTRAVENOUS

## 2021-11-11 MED ORDER — LIDOCAINE HCL 1 % IJ SOLN
INTRAMUSCULAR | Status: AC
  Filled 2021-11-11: qty 20

## 2021-11-11 MED ORDER — HEPARIN SOD (PORK) LOCK FLUSH 100 UNIT/ML IV SOLN
INTRAVENOUS | Status: AC
  Filled 2021-11-11: qty 5

## 2021-11-11 MED ORDER — MIDAZOLAM HCL 2 MG/2ML IJ SOLN
INTRAMUSCULAR | Status: AC
  Filled 2021-11-11: qty 2

## 2021-11-11 MED ORDER — FENTANYL CITRATE (PF) 100 MCG/2ML IJ SOLN
INTRAMUSCULAR | Status: AC
  Filled 2021-11-11: qty 2

## 2021-11-11 NOTE — Discharge Instructions (Signed)
Interventional radiology phone numbers °[phone] °After hours [phone] ° ° ° °You have skin glue (dermabond) over your new port. Do not use the lidocaine cream (EMLA cream) over the skin glue until it has healed. The petroleum in the lidocaine cream will dissolve the skin glue resulting in an infection of your new port. Use ice in a zip lock bag for 1-2 minutes over your new port before the cancer center nurses access your port. ° ° °Implanted Port Insertion, Care After °This sheet gives you information about how to care for yourself after your procedure. Your health care provider may also give you more specific instructions. If you have problems or questions, contact your health care provider. °What can I expect after the procedure? °After the procedure, it is common to have: °Discomfort at the port insertion site. °Bruising on the skin over the port. This should improve over 3-4 days. °Follow these instructions at home: °Port care °After your port is placed, you will get a manufacturer's information card. The card has information about your port. Keep this card with you at all times. °Take care of the port as told by your health care provider. Ask your health care provider if you or a family member can get training for taking care of the port at home. A home health care nurse may also take care of the port. °Make sure to remember what type of port you have. °Incision care °Follow instructions from your health care provider about how to take care of your port insertion site. Make sure you: °Wash your hands with soap and water before and after you change your bandage (dressing). If soap and water are not available, use hand sanitizer. °Change your dressing as told by your health care provider. °Leave skin glue in place. These skin closures may need to stay in place for 2 weeks or longer.  °Check your port insertion site every day for signs of infection. Check for: °Redness, swelling, or pain. °Fluid or  blood. °Warmth. °Pus or a bad smell.  °  °  °Activity °Return to your normal activities as told by your health care provider. Ask your health care provider what activities are safe for you. °Do not lift anything that is heavier than 10 lb (4.5 kg), or the limit that you are told, until your health care provider says that it is safe. °General instructions °Take over-the-counter and prescription medicines only as told by your health care provider. °Do not take baths, swim, or use a hot tub until your health care provider approves.You may remove your dressing tomorrow and shower 24 hours after your procedure. °Do not drive for 24 hours if you were given a sedative during your procedure. °Wear a medical alert bracelet in case of an emergency. This will tell any health care providers that you have a port. °Keep all follow-up visits as told by your health care provider. This is important. °Contact a health care provider if: °You cannot flush your port with saline as directed, or you cannot draw blood from the port. °You have a fever or chills. °You have redness, swelling, or pain around your port insertion site. °You have fluid or blood coming from your port insertion site. °Your port insertion site feels warm to the touch. °You have pus or a bad smell coming from the port insertion site. °Get help right away if: °You have chest pain or shortness of breath. °You have bleeding from your port that you cannot control. °Summary °Take care of   the port as told by your health care provider. Keep the manufacturer's information card with you at all times. °Change your dressing as told by your health care provider. °Contact a health care provider if you have a fever or chills or if you have redness, swelling, or pain around your port insertion site. °Keep all follow-up visits as told by your health care provider. °This information is not intended to replace advice given to you by your health care provider. Make sure you discuss any  questions you have with your health care provider. °Document Revised: 06/21/2018 Document Reviewed: 06/21/2018 °Elsevier Patient Education © 2021 [employer] ° ° ° °Moderate Conscious Sedation, Adult, Care After °This sheet gives you information about how to care for yourself after your procedure. Your health care provider may also give you more specific instructions. If you have problems or questions, contact your health care provider. °What can I expect after the procedure? °After the procedure, it is common to have: °Sleepiness for several hours. °Impaired judgment for several hours. °Difficulty with balance. °Vomiting if you eat too soon. °Follow these instructions at home: °For the time period you were told by your health care provider: °Rest. °Do not participate in activities where you could fall or become injured. °Do not drive or use machinery. °Do not drink alcohol. °Do not take sleeping pills or medicines that cause drowsiness. °Do not make important decisions or sign legal documents. °Do not take care of children on your own.  °  °  °Eating and drinking °Follow the diet recommended by your health care provider. °Drink enough fluid to keep your urine pale yellow. °If you vomit: °Drink water, juice, or soup when you can drink without vomiting. °Make sure you have little or no nausea before eating solid foods.   °General instructions °Take over-the-counter and prescription medicines only as told by your health care provider. °Have a responsible adult stay with you for the time you are told. It is important to have someone help care for you until you are awake and alert. °Do not smoke. °Keep all follow-up visits as told by your health care provider. This is important. °Contact a health care provider if: °You are still sleepy or having trouble with balance after 24 hours. °You feel light-headed. °You keep feeling nauseous or you keep vomiting. °You develop a rash. °You have a fever. °You have redness or  swelling around the IV site. °Get help right away if: °You have trouble breathing. °You have new-onset confusion at home. °Summary °After the procedure, it is common to feel sleepy, have impaired judgment, or feel nauseous if you eat too soon. °Rest after you get home. Know the things you should not do after the procedure. °Follow the diet recommended by your health care provider and drink enough fluid to keep your urine pale yellow. °Get help right away if you have trouble breathing or new-onset confusion at home. °This information is not intended to replace advice given to you by your health care provider. Make sure you discuss any questions you have with your health care provider. °Document Revised: 03/22/2020 Document Reviewed: 10/19/2019 °Elsevier Patient Education © 2021 [employer] Inc.  °

## 2021-11-11 NOTE — Procedures (Signed)
Interventional Radiology Procedure Note  Procedure: Single Lumen Power Port Placement    Access:  Right IJ vein.  Findings: Catheter tip positioned at SVC/RA junction. Port is ready for immediate use.   Complications: None  EBL: < 10 mL  Recommendations:  - Ok to shower in 24 hours - Do not submerge for 7 days - Routine line care   [name] T. Kylieann Eagles, M.D Pager:  [phone]   

## 2021-11-11 NOTE — H&P (Signed)
Chief Complaint: Patient was seen in consultation today for port placement at the request of Feng,Yan  Referring Physician(s): Feng,Yan  Supervising Physician: Aletta Edouard  Patient Status: [facility] - Out-pt  History of Present Illness: Leslie Mullen is a 60 y.o. female with metastatic pancreatic cancer. She is referred for port placement. PMHx, meds, labs, imaging, allergies reviewed. Feels well, no recent fevers, chills, illness. Has been NPO today as directed.   Past Medical History:  Diagnosis Date   Abnormal uterine bleeding    Allergy    Anemia    Asthma    Cataract    BOTH EYES   Complete heart block ([place])    a. Med[employer] PPM 10/2020.   Diabetes ([place])    type II    Diabetic neuropathy ([place])    Endometrial cancer ([place])    Family history of colon cancer    GERD (gastroesophageal reflux disease)    Hyperlipidemia    Hypertension    Morbid obesity ([place])    Osteoarthritis    right knee    Pancreatitis    Pneumonia     Past Surgical History:  Procedure Laterality Date   CERVICAL BIOPSY     CESAREAN SECTION     x3   COLONOSCOPY WITH PROPOFOL N/A 06/29/2017   Procedure: COLONOSCOPY WITH PROPOFOL;  Surgeon: Irene Shipper, MD;  Location: [facility] ENDOSCOPY;  Service: Endoscopy;  Laterality: N/A;   CYSTECTOMY Right 2010   Cyst removed from right groin.    DILATION AND CURETTAGE OF UTERUS N/A 07/29/2021   Procedure: DILATATION AND CURETTAGE OF UTERUS;  Surgeon: Everitt Amber, MD;  Location: [facility] ORS;  Service: Gynecology;  Laterality: N/A;   INTRAUTERINE DEVICE (IUD) INSERTION N/A 07/29/2021   Procedure: INTRAUTERINE DEVICE (IUD) INSERTION;  Surgeon: Everitt Amber, MD;  Location: [facility] ORS;  Service: Gynecology;  Laterality: N/A;  IUD FROM MAIN PHARMACY   PACEMAKER IMPLANT N/A 10/11/2020   Procedure: PACEMAKER IMPLANT;  Surgeon: Vickie Epley, MD;  Location: [place] CV LAB;  Service: Cardiovascular;  Laterality: N/A;   TONSILLECTOMY       Allergies: Penicillins  Medications: Prior to Admission medications   Medication Sig Start Date End Date Taking? Authorizing Provider  amLODipine (NORVASC) 10 MG tablet Take 1 tablet (10 mg total) by mouth at bedtime. 01/03/21  Yes [name], MD  gabapentin (NEURONTIN) 300 MG capsule Take 1 capsule (300 mg total) by mouth 2 (two) times daily. Patient taking differently: Take 300 mg by mouth 3 (three) times daily. 10/12/20  Yes Evans Lance, MD  magic mouthwash w/lidocaine SOLN Take 5 mLs by mouth 4 (four) times daily. 11/06/21  Yes Alla Feeling, NP  mirtazapine (REMERON) 7.5 MG tablet Take 1 tablet (7.5 mg total) by mouth at bedtime. 10/14/21  Yes Truitt Merle, MD  ondansetron (ZOFRAN ODT) 4 MG disintegrating tablet Take 1 tablet (4 mg total) by mouth every 8 (eight) hours as needed for nausea or vomiting. 10/23/21  Yes Truitt Merle, MD  ondansetron (ZOFRAN) 8 MG tablet Take 1 tablet (8 mg total) by mouth 2 (two) times daily as needed (Nausea or vomiting). 10/14/21  Yes Truitt Merle, MD  Oxycodone HCl 10 MG TABS Take 1 tablet (10 mg total) by mouth every 6 (six) hours as needed. 10/14/21  Yes Truitt Merle, MD  polyethylene glycol (MIRALAX / GLYCOLAX) 17 g packet Take 17 g by mouth daily as needed for moderate constipation.   Yes [provider]  pravastatin (PRAVACHOL) 40 MG  tablet TAKE 1 TABLET(40 MG) BY MOUTH DAILY 07/23/20  Yes [name], MD  prochlorperazine (COMPAZINE) 10 MG tablet Take 1 tablet (10 mg total) by mouth every 6 (six) hours as needed for nausea or vomiting. 10/10/21  Yes Truitt Merle, MD  apixaban (ELIQUIS) 5 MG TABS tablet Take 1 tablet (5 mg total) by mouth 2 (two) times daily. 09/18/21   Fenton, Clint R, PA  glucose blood test strip  07/24/15   [provider]  insulin degludec (TRESIBA FLEXTOUCH) 200 UNIT/ML FlexTouch Pen Inject 20 Units into the skin every evening. Start at 20 units daily-if you blood glucose levels are still on the higher side-slowly  increase back to your usual regimen of 80 units daily. 10/09/21   Ghimire, Henreitta Leber, MD  Insulin Pen Needle 31G X 5 MM MISC  08/01/15   [provider]  lidocaine-prilocaine (EMLA) cream Apply to affected area once 10/14/21   Truitt Merle, MD  loratadine (CLARITIN) 10 MG tablet Take 10 mg by mouth daily as needed for allergies.    [provider]  omeprazole (PRILOSEC) 40 MG capsule Take 40 mg by mouth 2 (two) times daily. 09/05/21   [provider]  ONETOUCH DELICA LANCETS 32T MISC  08/01/15   [provider]  prochlorperazine (COMPAZINE) 10 MG tablet Take 1 tablet (10 mg total) by mouth every 6 (six) hours as needed (Nausea or vomiting). 10/14/21   Truitt Merle, MD  SYNJARDY XR 12.04-999 MG TB24 Take 1 tablet by mouth 2 (two) times daily. 05/30/21   [provider]  trolamine salicylate (ASPERCREME) 10 % cream Apply 1 application topically 2 (two) times daily as needed for muscle pain.    [provider]     Family History  Adopted: Yes  Problem Relation Age of Onset   Diabetes Father    Heart disease Father    Cancer Mother        colon   Colon cancer Neg Hx    Esophageal cancer Neg Hx    Rectal cancer Neg Hx    Stomach cancer Neg Hx     Social History   Socioeconomic History   Marital status: Married    Spouse name: Not on file   Number of children: 2   Years of education: Not on file   Highest education level: Not on file  Occupational History   Occupation: Intake Coordinator  Tobacco Use   Smoking status: Former    Types: Cigarettes   Smokeless tobacco: Never   Tobacco comments:    Former smoker (09/18/2021)  Vaping Use   Vaping Use: Never used  Substance and Sexual Activity   Alcohol use: No    Alcohol/week: 0.0 standard drinks   Drug use: No   Sexual activity: Not on file  Other Topics Concern   Not on file  Social History Narrative   Not on file   Social Determinants of Health   Financial Resource Strain: Not on  file  Food Insecurity: Not on file  Transportation Needs: Not on file  Physical Activity: Not on file  Stress: Not on file  Social Connections: Not on file     Review of Systems: A 12 point ROS discussed and pertinent positives are indicated in the HPI above.  All other systems are negative.  Review of Systems  Vital Signs: BP (!) 156/91   Pulse 92   Temp 98.5 F (36.9 C) (Oral)   Resp 18   SpO2 99%  Physical Exam Constitutional:      Appearance: Normal appearance.  HENT:     Mouth/Throat:     Mouth: Mucous membranes are moist.     Pharynx: Oropharynx is clear.  Cardiovascular:     Rate and Rhythm: Normal rate and regular rhythm.     Heart sounds: Normal heart sounds.  Pulmonary:     Effort: Pulmonary effort is normal. No respiratory distress.     Breath sounds: Normal breath sounds.  Skin:    General: Skin is warm and dry.  Neurological:     General: No focal deficit present.     Mental Status: She is alert and oriented to person, place, and time.  Psychiatric:        Mood and Affect: Mood normal.        Thought Content: Thought content normal.     Imaging: CT CHEST WO CONTRAST  Result Date: 10/15/2021 CLINICAL DATA:  60 year old female with history of pancreatic cancer. Staging examination. EXAM: CT CHEST WITHOUT CONTRAST TECHNIQUE: Multidetector CT imaging of the chest was performed following the standard protocol without IV contrast. COMPARISON:  No prior chest CT. FINDINGS: Cardiovascular: Heart size is mildly enlarged. There is no significant pericardial fluid, thickening or pericardial calcification. There is aortic atherosclerosis, as well as atherosclerosis of the great vessels of the mediastinum and the coronary arteries, including calcified atherosclerotic plaque in the left anterior descending, left circumflex and right coronary arteries. Left-sided pacemaker/AICD device in place with lead tips terminating in the right atrium and right ventricular apex.  Mediastinum/Nodes: No pathologically enlarged mediastinal or hilar lymph nodes. Please note that accurate exclusion of hilar adenopathy is limited on noncontrast CT scans. Esophagus is unremarkable in appearance. No axillary lymphadenopathy. Lungs/Pleura: No suspicious appearing pulmonary nodules or masses are noted. No acute consolidative airspace disease. No pleural effusions. Mild linear scarring noted in the medial segment of the right middle lobe and inferior segment of the lingula. Upper Abdomen: Atherosclerotic calcifications are noted in the abdominal aorta. Musculoskeletal: There are no aggressive appearing lytic or blastic lesions noted in the visualized portions of the skeleton. IMPRESSION: 1. No signs of metastatic disease to the thorax. 2. Mild cardiomegaly. 3. Aortic atherosclerosis, in addition to 3 vessel coronary artery disease. Please note that although the presence of coronary artery calcium documents the presence of coronary artery disease, the severity of this disease and any potential stenosis cannot be assessed on this non-gated CT examination. Assessment for potential risk factor modification, dietary therapy or pharmacologic therapy may be warranted, if clinically indicated. Aortic Atherosclerosis (ICD10-I70.0). Electronically Signed   By: Vinnie Langton M.D.   On: 10/15/2021 09:24   CT Abdomen Pelvis W Contrast  Result Date: 10/25/2021 CLINICAL DATA:  Known pancreatic mass with nausea and vomiting and abdominal pain, initial encounter EXAM: CT ABDOMEN AND PELVIS WITH CONTRAST TECHNIQUE: Multidetector CT imaging of the abdomen and pelvis was performed using the standard protocol following bolus administration of intravenous contrast. CONTRAST:  162mL OMNIPAQUE IOHEXOL 300 MG/ML  SOLN COMPARISON:  10/05/2021 FINDINGS: Lower chest: Patchy right lower lobe infiltrate consistent with early pneumonia. Hepatobiliary: Gallbladder is within normal limits. Liver is well visualized with  scattered hypodensities the largest of which lies in the lateral segment of the left lobe of the liver consistent with known metastatic disease. Pancreas: Pancreas again demonstrates a large centrally necrotic mass lesion which measures approximately 6 x 5.3 cm in greatest dimension. This appears larger than that seen on prior study. The more peripheral pancreas  is unremarkable. Spleen: Normal in size without focal abnormality. Adrenals/Urinary Tract: Adrenal glands are within normal limits. Kidneys demonstrate no renal calculi or obstructive changes. The ureters are within normal limits. Bladder is partially distended. Stomach/Bowel: Appendix is within normal limits. No obstructive or inflammatory changes of the colon are seen. Small bowel and stomach are unremarkable. Vascular/Lymphatic: Aortic atherosclerosis. A gastrohepatic ligament lymph node is noted which measures 10 mm in short axis. Portacaval node measuring 12 mm in short axis is noted. Reproductive: Uterus and bilateral adnexa are unremarkable. IUD is noted in place. Other: Small fat containing umbilical hernia stable from the prior study. Musculoskeletal: Degenerative changes of lumbar spine. IMPRESSION: Persistent pancreatic head mass which appears slightly larger than that seen on prior MRI now measuring up to 6 cm in greatest dimension. Some adjacent lymphadenopathy is noted as well. Hypodensities within the liver which correspond to that seen on prior MRI consistent with metastatic disease largest of which is noted in the left lobe of the liver. No other focal abnormality is noted. Electronically Signed   By: Inez Catalina M.D.   On: 10/25/2021 03:22    Labs:  CBC: Recent Labs    10/20/21 1221 10/23/21 0804 10/25/21 0021 10/31/21 1250  WBC 15.1* 15.6* 14.8* 9.0  HGB 8.9* 9.0* 9.2* 7.6*  HCT 29.7* 29.6* 30.8* 25.2*  PLT 347 402* 412* 243    COAGS: Recent Labs    06/15/21 0050 10/06/21 1637 10/07/21 0219 10/07/21 1827  10/08/21 0254 10/08/21 1025 10/09/21 0158  INR 1.0 1.5*  --   --   --   --   --   APTT  --  27   < > 54* 88* 50* 64*   < > = values in this interval not displayed.    BMP: Recent Labs    10/20/21 1221 10/23/21 0804 10/25/21 0021 10/31/21 1250  NA 143 143 140 141  K 3.9 5.0 4.1 4.0  CL 105 104 103 106  CO2 28 30 25 23   GLUCOSE 151* 189* 184* 112*  BUN 13 14 13 15   CALCIUM 8.7* 9.3 8.6* 8.6*  CREATININE 1.89* 1.73* 1.47* 1.55*  GFRNONAA 30* 34* 41* 38*    LIVER FUNCTION TESTS: Recent Labs    10/20/21 1221 10/23/21 0804 10/25/21 0021 10/31/21 1250  BILITOT 0.2* 0.2* 0.6 0.2*  AST 13* 10* 18 15  ALT 7 7 11 11   ALKPHOS 80 72 70 71  PROT 7.8 7.8 7.7 7.4  ALBUMIN 2.1* 3.1* 2.0* 1.7*    TUMOR MARKERS: No results for input(s): AFPTM, CEA, CA199, CHROMGRNA in the last 8760 hours.  Assessment and Plan: Metastatic pancreatic cancer. For port placement. Risks and benefits of image guided port-a-catheter placement was discussed with the patient including, but not limited to bleeding, infection, pneumothorax, or fibrin sheath development and need for additional procedures.  All of the patient's questions were answered, patient is agreeable to proceed. Consent signed and in chart.   Thank you for this interesting consult.  I greatly enjoyed meeting Barney Gertsch and look forward to participating in their care.  A copy of this report was sent to the requesting provider on this date.  Electronically Signed: Ascencion Dike, PA-C 11/11/2021, 1:04 PM   I spent a total of 20 minutes in face to face in clinical consultation, greater than 50% of which was counseling/coordinating care for port

## 2021-11-12 ENCOUNTER — Encounter: Payer: Self-pay | Admitting: Hematology

## 2021-11-13 ENCOUNTER — Other Ambulatory Visit: Payer: Self-pay | Admitting: Nurse Practitioner

## 2021-11-13 DIAGNOSIS — C259 Malignant neoplasm of pancreas, unspecified: Secondary | ICD-10-CM

## 2021-11-13 MED ORDER — ONDANSETRON HCL 8 MG PO TABS: 8.0000 mg | ORAL_TABLET | Freq: Two times a day (BID) | ORAL | 1 refills | Status: DC | PRN

## 2021-11-13 MED ORDER — LORAZEPAM 0.5 MG PO TABS: 0.5000 mg | ORAL_TABLET | Freq: Two times a day (BID) | ORAL | 0 refills | Status: DC | PRN

## 2021-11-13 MED ORDER — PROCHLORPERAZINE MALEATE 10 MG PO TABS: 10.0000 mg | ORAL_TABLET | Freq: Four times a day (QID) | ORAL | 1 refills | Status: DC | PRN

## 2021-11-17 NOTE — Progress Notes (Signed)
[place]   [phone] Fax:([phone]) [phone]   Clinic Follow up Note   Patient Care Team: Elinor Parkinson as PCP - General (Physician Assistant) Vickie Epley, MD as PCP - Electrophysiology (Cardiology) Josue Hector, MD as PCP - Cardiology (Cardiology) 11/18/2021  CHIEF COMPLAINT: Follow up pancreatic cancer   SUMMARY OF ONCOLOGIC HISTORY: Oncology History Overview Note  Cancer Staging Pancreatic cancer metastasized to liver [facility]) Staging form: Exocrine Pancreas, AJCC 8th Edition - Clinical stage from 10/14/2021: Stage IV (cT3, cN1, pM1) - Signed by Truitt Merle, MD on 10/14/2021    Pancreatic cancer metastasized to liver ([place])  09/24/2021 Imaging   EXAM: ULTRASOUND ABDOMEN LIMITED RIGHT UPPER QUADRANT  IMPRESSION: 5.3 cm lesion adjacent to the pancreatic head. While this may reflect a loop of bowel, a mass is not excluded. Consider CT abdomen with contrast for further evaluation.   09/25/2021 Imaging   EXAM: CT ABDOMEN AND PELVIS WITH CONTRAST  IMPRESSION: 4.1 cm low-density lesion in the pancreatic head/body, possibly reflecting focal pancreatitis given the clinical history. However, this warrants short-term follow-up in 4-6 weeks to document improvement/resolution. Alternatively, EUS could be considered for tissue sampling as clinically warranted.   10/06/2021 Imaging   EXAM: MRI ABDOMEN WITHOUT AND WITH CONTRAST  IMPRESSION: Findings of pancreatic neoplasm with hepatic metastatic disease, local nodal disease likely with greater than 180 degree involvement of the portal vein, sampling of LEFT hepatic lobe lesion may be helpful to initiate further workup. Subsequent local staging could be performed with pancreatic protocol CT and EUS as indicated.   Local invasion of stomach and duodenum are suspected.   Mild stranding about the pancreas could be indicative of mild superimposed pancreatitis or local tumor extension  particularly into the transverse mesocolon.   10/08/2021 Pathology Results   FINAL MICROSCOPIC DIAGNOSIS:   A. LIVER,  LEFT LOBE, BIOPSY:  -  Poorly differentiated carcinoma  -  See comment   COMMENT:  By immunohistochemistry, the neoplastic cells are positive for  cytokeratin 7 and weakly positive for HepPar.  The cells are negative for cytokeratin 20, GATA3, arginase, PAX8, CDX2 and TTF-1.  The immune O profile is nonspecific; however, the findings are compatible with a pancreatobiliary primary in the absence of other lesions.   10/14/2021 Initial Diagnosis   Pancreatic cancer metastasized to liver [facility])   10/14/2021 Cancer Staging   Staging form: Exocrine Pancreas, AJCC 8th Edition - Clinical stage from 10/14/2021: Stage IV (cT3, cN1, pM1) - Signed by Truitt Merle, MD on 10/14/2021 Stage prefix: Initial diagnosis    10/23/2021 -  Chemotherapy   Patient is on Treatment Plan : PANCREATIC Abraxane / Gemcitabine D1,8,15 q28d       CURRENT THERAPY: Gemcitabine/Abraxane days 1 and 8, started 10/23/21  INTERVAL HISTORY: Ms. Shankles returns with her daughter for follow up and treatment as scheduled. Last seen 10/28/21 and received cycle 2 day 8 on 11/25 with dose-reduced abraxane and additional premed aloxi.  Less nausea and vomiting, able to keep p.o. down, she continues alternating Zofran and Compazine around-the-clock.  Pain is managed at a 4/10 with oxycodone every 8 hours and occasional Tylenol she is out at church.  She gets tired during the sermons and rest frequently at home.  Given that she is on oxycodone she has not started Ativan for refractory N/V.  Appetite improved with mirtazapine, able to eat and drink, trying to incorporate protein.  She had mouth sores that did limit p.o. but resolved with Magic mouthwash.  She has stable mild exertional dyspnea; denies cough, chest pain, fever, chills.  Denies rash, neuropathy, or any other new specific complaints.  This cycle was delayed for port  placement which she tolerated well.   MEDICAL HISTORY:  Past Medical History:  Diagnosis Date   Abnormal uterine bleeding    Allergy    Anemia    Asthma    Cataract    BOTH EYES   Complete heart block ([place])    a. Medtronic PPM 10/2020.   Diabetes ([place])    type II    Diabetic neuropathy ([place])    Endometrial cancer ([place])    Family history of colon cancer    GERD (gastroesophageal reflux disease)    Hyperlipidemia    Hypertension    Morbid obesity ([place])    Osteoarthritis    right knee    Pancreatitis    Pneumonia     SURGICAL HISTORY: Past Surgical History:  Procedure Laterality Date   CERVICAL BIOPSY     CESAREAN SECTION     x3   COLONOSCOPY WITH PROPOFOL N/A 06/29/2017   Procedure: COLONOSCOPY WITH PROPOFOL;  Surgeon: Irene Shipper, MD;  Location: [facility];  Service: Endoscopy;  Laterality: N/A;   CYSTECTOMY Right 2010   Cyst removed from right groin.    DILATION AND CURETTAGE OF UTERUS N/A 07/29/2021   Procedure: DILATATION AND CURETTAGE OF UTERUS;  Surgeon: Everitt Amber, MD;  Location: [facility] ORS;  Service: Gynecology;  Laterality: N/A;   INTRAUTERINE DEVICE (IUD) INSERTION N/A 07/29/2021   Procedure: INTRAUTERINE DEVICE (IUD) INSERTION;  Surgeon: Everitt Amber, MD;  Location: [facility] ORS;  Service: Gynecology;  Laterality: N/A;  IUD FROM MAIN PHARMACY   IR IMAGING GUIDED PORT INSERTION  11/11/2021   PACEMAKER IMPLANT N/A 10/11/2020   Procedure: PACEMAKER IMPLANT;  Surgeon: Vickie Epley, MD;  Location: [facility] CV LAB;  Service: Cardiovascular;  Laterality: N/A;   TONSILLECTOMY      I have reviewed the social history and family history with the patient and they are unchanged from previous note.  ALLERGIES:  is allergic to penicillins.  MEDICATIONS:  Current Outpatient Medications  Medication Sig Dispense Refill   amLODipine (NORVASC) 10 MG tablet Take 1 tablet (10 mg total) by mouth at bedtime. 30 tablet 0   apixaban (ELIQUIS) 5 MG TABS tablet Take 1 tablet (5 mg  total) by mouth 2 (two) times daily. 60 tablet 3   gabapentin (NEURONTIN) 300 MG capsule Take 1 capsule (300 mg total) by mouth 2 (two) times daily. (Patient taking differently: Take 300 mg by mouth 3 (three) times daily.)     glucose blood test strip      insulin degludec (TRESIBA FLEXTOUCH) 200 UNIT/ML FlexTouch Pen Inject 20 Units into the skin every evening. Start at 20 units daily-if you blood glucose levels are still on the higher side-slowly increase back to your usual regimen of 80 units daily.     Insulin Pen Needle 31G X 5 MM MISC      lidocaine-prilocaine (EMLA) cream Apply to affected area once 30 g 3   loratadine (CLARITIN) 10 MG tablet Take 10 mg by mouth daily as needed for allergies.     LORazepam (ATIVAN) 0.5 MG tablet Take 1 tablet (0.5 mg total) by mouth 2 (two) times daily as needed (for anxiety, and/or chemo-related nausea/vomiting). 30 tablet 0   magic mouthwash w/lidocaine SOLN Take 5 mLs by mouth 4 (four) times daily. 470 mL 2   mirtazapine (REMERON) 7.5  MG tablet Take 1 tablet (7.5 mg total) by mouth at bedtime. 30 tablet 0   omeprazole (PRILOSEC) 40 MG capsule Take 40 mg by mouth 2 (two) times daily.     ondansetron (ZOFRAN) 8 MG tablet Take 1 tablet (8 mg total) by mouth 2 (two) times daily as needed (Nausea or vomiting). 30 tablet 1   ONETOUCH DELICA LANCETS 69G MISC      Oxycodone HCl 10 MG TABS Take 1 tablet (10 mg total) by mouth every 6 (six) hours as needed. 90 tablet 0   polyethylene glycol (MIRALAX / GLYCOLAX) 17 g packet Take 17 g by mouth daily as needed for moderate constipation.     pravastatin (PRAVACHOL) 40 MG tablet TAKE 1 TABLET(40 MG) BY MOUTH DAILY 90 tablet 1   prochlorperazine (COMPAZINE) 10 MG tablet Take 1 tablet (10 mg total) by mouth every 6 (six) hours as needed (Nausea or vomiting). 30 tablet 1   SYNJARDY XR 12.04-999 MG TB24 Take 1 tablet by mouth 2 (two) times daily.     trolamine salicylate (ASPERCREME) 10 % cream Apply 1 application topically  2 (two) times daily as needed for muscle pain.     No current facility-administered medications for this visit.    PHYSICAL EXAMINATION: ECOG PERFORMANCE STATUS: 2 - Symptomatic, <50% confined to bed  Vitals:   11/18/21 0918  BP: 118/61  Pulse: 84  Resp: 17  Temp: 97.6 F (36.4 C)  SpO2: 99%   Filed Weights   11/18/21 0918  Weight: (!) 310 lb (140.6 kg)    GENERAL:alert, no distress and comfortable SKIN: Dry skin without rash EYES: sclera clear LUNGS: clear with normal breathing effort HEART: regular rate & rhythm, no lower extremity edema ABDOMEN:abdomen soft, non-tender and normal bowel sounds NEURO: alert & oriented x 3 with fluent speech, in a wheelchair PAC covered with gauze, no surrounding edema  LABORATORY DATA:  I have reviewed the data as listed CBC Latest Ref Rng & Units 11/18/2021 10/31/2021 10/25/2021  WBC 4.0 - 10.5 K/uL 12.8(H) 9.0 14.8(H)  Hemoglobin 12.0 - 15.0 g/dL 8.0(L) 7.6(L) 9.2(L)  Hematocrit 36.0 - 46.0 % 26.2(L) 25.2(L) 30.8(L)  Platelets 150 - 400 K/uL 501(H) 243 412(H)     CMP Latest Ref Rng & Units 11/18/2021 10/31/2021 10/25/2021  Glucose 70 - 99 mg/dL 122(H) 112(H) 184(H)  BUN 6 - 20 mg/dL 13 15 13   Creatinine 0.44 - 1.00 mg/dL 1.36(H) 1.55(H) 1.47(H)  Sodium 135 - 145 mmol/L 142 141 140  Potassium 3.5 - 5.1 mmol/L 4.0 4.0 4.1  Chloride 98 - 111 mmol/L 109 106 103  CO2 22 - 32 mmol/L 22 23 25   Calcium 8.9 - 10.3 mg/dL 8.1(L) 8.6(L) 8.6(L)  Total Protein 6.5 - 8.1 g/dL 6.8 7.4 7.7  Total Bilirubin 0.3 - 1.2 mg/dL 0.3 0.2(L) 0.6  Alkaline Phos 38 - 126 U/L 68 71 70  AST 15 - 41 U/L 15 15 18   ALT 0 - 44 U/L 7 11 11       RADIOGRAPHIC STUDIES: I have personally reviewed the radiological images as listed and agreed with the findings in the report. No results found.   ASSESSMENT & PLAN: Glenola Wheat is a 60 y.o. female with    1. Pancreatic cancer metastasized to liver, stage IV  -initially presented with abdominal/epigastric  pain. [place] was performed at her first ED visit on 09/24/21 showing a 5.3 cm lesion adjacent to pancreatic head. Follow up CT AP showed 4.1 cm lesion in pancreatic head.  She was recommended to f/u with GI. -she returned to ED on 10/05/21 and was admitted. Abd MRI showed findings of pancreatic neoplasm with hepatic metastatic disease, local nodal disease likely, local invasion of stomach and duodenum suspected, and mild stranding about pancreas. -liver biopsy was performed 10/08/21 and confirmed poorly differentiated carcinoma, compatible with a pancreatobiliary primary. Chest CT 10/14/21 negative for metastatic disease. -she started first line palliative gemcitabine/abraxane on 10/23/21. She tolerated C1D1 rather poorly with severe nausea and vomiting, prompting ED visit. -CT AP performed in ED 10/25/21 showed slight enlargement to pancreatic head mass, now 6 cm, with adjacent lymphadenopathy and liver metastasis. -Ms. Mcneely appears stable. S/p C2D8 with dose-reduced abraxane and addition of aloxi premed. She tolerated much better, N/V improved. Mucositis managed with mouthwash. Appetite improved with mirtazapine. Pain is stable on current regimen.  -She is able to recover and function mostly well, still gets fatigued. No clinical evidence of disease progression.  -underwent PAC placement, tolerated well -Labs reviewed, adequate to proceed with C2D1 gemcitabine and dose-reduced abraxane, same dose and same premeds.  -return C2D8 next week, then f/up with cycle 3 in 3 weeks. She knows to call sooner if needed.   2. Symptom Management: Nausea, Loss of appetite, Epigastric pain, Fatigue -N/V better managed with scheduled zofran and compazine, she has ativan if needed but hasn't taken due to being on oxycodone  -abd pain stable 4/10, managed with oxycodone q6. She will take tylenol instead if she is out/at Church -appetite improved on mirtazapine, continues to lose weight. Continue dietician f/up  -Hgb 8.0,  she is not very symptomatic. Range 7.6 - 9.2 lately. Will add iron studies and T/S to labs next week   3. Goal of care discussion, Social support -she understands the incurable nature of her cancer, and the overall poor prognosis, especially if she does not have good response to chemotherapy or progress on chemo -The patient understands the goal of care is palliative. -she is full code now  -she declined need for SW, chaplain, financial or other supportive resources at this time   PLAN: -Lab reviewed -Proceed with cycle 2 day 1 gem and dose-reduced abraxane same as C1D8 -Continue supportive meds at home -Refilled mirtazapine -Increase protein -Return for C2D8 next week, will add sample for blood bank to transfuse if Hgb <8 -F/up in 3 weeks with cycle 3 -She knows to call if SE's are unmanaged or any new/worsening concerns   Orders Placed This Encounter  Procedures   Ferritin    Standing Status:   Standing    Number of Occurrences:   1    Standing Expiration Date:   11/18/2022   Iron and TIBC    Standing Status:   Standing    Number of Occurrences:   1    Standing Expiration Date:   11/18/2022   Sample to Blood Bank    Standing Status:   Future    Standing Expiration Date:   11/18/2022     All questions were answered. The patient knows to call the clinic with any problems, questions or concerns. No barriers to learning were detected.     Alla Feeling, NP 11/18/21

## 2021-11-17 NOTE — Telephone Encounter (Signed)
Second unsuccessful telephone call to patient to request manual transmission and to follow up on s/s AF with RVR present on 12/2 remote transmission. Hipaa compliant VM message left requesting call back to [phone].

## 2021-11-18 ENCOUNTER — Encounter: Payer: Self-pay | Admitting: Licensed Clinical Social Worker

## 2021-11-18 ENCOUNTER — Encounter: Payer: Self-pay | Admitting: Nurse Practitioner

## 2021-11-18 ENCOUNTER — Inpatient Hospital Stay: Payer: 59

## 2021-11-18 ENCOUNTER — Inpatient Hospital Stay: Payer: 59 | Attending: Gynecologic Oncology

## 2021-11-18 ENCOUNTER — Other Ambulatory Visit: Payer: Self-pay

## 2021-11-18 ENCOUNTER — Inpatient Hospital Stay (HOSPITAL_BASED_OUTPATIENT_CLINIC_OR_DEPARTMENT_OTHER): Payer: 59 | Admitting: Nurse Practitioner

## 2021-11-18 VITALS — BP 118/61 | HR 84 | Temp 97.6°F | Resp 17 | Ht 68.0 in | Wt 310.0 lb

## 2021-11-18 DIAGNOSIS — C259 Malignant neoplasm of pancreas, unspecified: Secondary | ICD-10-CM | POA: Diagnosis not present

## 2021-11-18 DIAGNOSIS — C787 Secondary malignant neoplasm of liver and intrahepatic bile duct: Secondary | ICD-10-CM | POA: Diagnosis not present

## 2021-11-18 DIAGNOSIS — Z95828 Presence of other vascular implants and grafts: Secondary | ICD-10-CM | POA: Insufficient documentation

## 2021-11-18 DIAGNOSIS — Z79899 Other long term (current) drug therapy: Secondary | ICD-10-CM | POA: Diagnosis not present

## 2021-11-18 DIAGNOSIS — Z5111 Encounter for antineoplastic chemotherapy: Secondary | ICD-10-CM | POA: Diagnosis not present

## 2021-11-18 DIAGNOSIS — C250 Malignant neoplasm of head of pancreas: Secondary | ICD-10-CM | POA: Diagnosis present

## 2021-11-18 LAB — CBC WITH DIFFERENTIAL (CANCER CENTER ONLY)
Abs Immature Granulocytes: 0.21 10*3/uL — ABNORMAL HIGH (ref 0.00–0.07)
Basophils Absolute: 0.1 10*3/uL (ref 0.0–0.1)
Basophils Relative: 1 %
Eosinophils Absolute: 0.5 10*3/uL (ref 0.0–0.5)
Eosinophils Relative: 4 %
HCT: 26.2 % — ABNORMAL LOW (ref 36.0–46.0)
Hemoglobin: 8 g/dL — ABNORMAL LOW (ref 12.0–15.0)
Immature Granulocytes: 2 %
Lymphocytes Relative: 22 %
Lymphs Abs: 2.8 10*3/uL (ref 0.7–4.0)
MCH: 26.2 pg (ref 26.0–34.0)
MCHC: 30.5 g/dL (ref 30.0–36.0)
MCV: 85.9 fL (ref 80.0–100.0)
Monocytes Absolute: 1.4 10*3/uL — ABNORMAL HIGH (ref 0.1–1.0)
Monocytes Relative: 11 %
Neutro Abs: 7.8 10*3/uL — ABNORMAL HIGH (ref 1.7–7.7)
Neutrophils Relative %: 60 %
Platelet Count: 501 10*3/uL — ABNORMAL HIGH (ref 150–400)
RBC: 3.05 MIL/uL — ABNORMAL LOW (ref 3.87–5.11)
RDW: 17.9 % — ABNORMAL HIGH (ref 11.5–15.5)
WBC Count: 12.8 10*3/uL — ABNORMAL HIGH (ref 4.0–10.5)
nRBC: 0.2 % (ref 0.0–0.2)

## 2021-11-18 LAB — CMP (CANCER CENTER ONLY)
ALT: 7 U/L (ref 0–44)
AST: 15 U/L (ref 15–41)
Albumin: 1.9 g/dL — ABNORMAL LOW (ref 3.5–5.0)
Alkaline Phosphatase: 68 U/L (ref 38–126)
Anion gap: 11 (ref 5–15)
BUN: 13 mg/dL (ref 6–20)
CO2: 22 mmol/L (ref 22–32)
Calcium: 8.1 mg/dL — ABNORMAL LOW (ref 8.9–10.3)
Chloride: 109 mmol/L (ref 98–111)
Creatinine: 1.36 mg/dL — ABNORMAL HIGH (ref 0.44–1.00)
GFR, Estimated: 45 mL/min — ABNORMAL LOW (ref >60)
Glucose, Bld: 122 mg/dL — ABNORMAL HIGH (ref 70–99)
Potassium: 4 mmol/L (ref 3.5–5.1)
Sodium: 142 mmol/L (ref 135–145)
Total Bilirubin: 0.3 mg/dL (ref 0.3–1.2)
Total Protein: 6.8 g/dL (ref 6.5–8.1)

## 2021-11-18 MED ORDER — HEPARIN SOD (PORK) LOCK FLUSH 100 UNIT/ML IV SOLN
500.0000 [IU] | Freq: Once | INTRAVENOUS | Status: AC | PRN
  Administered 2021-11-18: 500 [IU]

## 2021-11-18 MED ORDER — SODIUM CHLORIDE 0.9% FLUSH
10.0000 mL | Freq: Once | INTRAVENOUS | Status: AC
  Administered 2021-11-18: 10 mL

## 2021-11-18 MED ORDER — SODIUM CHLORIDE 0.9 % IV SOLN: Freq: Once | INTRAVENOUS | Status: AC

## 2021-11-18 MED ORDER — SODIUM CHLORIDE 0.9 % IV SOLN
1000.0000 mg/m2 | Freq: Once | INTRAVENOUS | Status: AC
  Administered 2021-11-18: 2698 mg via INTRAVENOUS
  Filled 2021-11-18: qty 70.96

## 2021-11-18 MED ORDER — PALONOSETRON HCL INJECTION 0.25 MG/5ML
0.2500 mg | Freq: Once | INTRAVENOUS | Status: AC
  Administered 2021-11-18: 0.25 mg via INTRAVENOUS
  Filled 2021-11-18: qty 5

## 2021-11-18 MED ORDER — PACLITAXEL PROTEIN-BOUND CHEMO INJECTION 100 MG
100.0000 mg/m2 | Freq: Once | INTRAVENOUS | Status: AC
  Administered 2021-11-18: 275 mg via INTRAVENOUS
  Filled 2021-11-18: qty 55

## 2021-11-18 MED ORDER — MIRTAZAPINE 7.5 MG PO TABS
7.5000 mg | ORAL_TABLET | Freq: Every day | ORAL | 0 refills | Status: DC
Start: 2021-11-18 — End: 2021-12-11

## 2021-11-18 MED ORDER — PROCHLORPERAZINE MALEATE 10 MG PO TABS
10.0000 mg | ORAL_TABLET | Freq: Once | ORAL | Status: AC
  Administered 2021-11-18: 10 mg via ORAL
  Filled 2021-11-18: qty 1

## 2021-11-18 MED ORDER — SODIUM CHLORIDE 0.9% FLUSH
10.0000 mL | INTRAVENOUS | Status: DC | PRN
  Administered 2021-11-18: 10 mL

## 2021-11-18 NOTE — Patient Instructions (Signed)
[facility] ONCOLOGY  Discharge Instructions: Thank you for choosing [facility] to provide your oncology and hematology care.   If you have a lab appointment with the [facility], please go directly to the [facility] and check in at the registration area.   Wear comfortable clothing and clothing appropriate for easy access to any Portacath or PICC line.   We strive to give you quality time with your provider. You may need to reschedule your appointment if you arrive late (15 or more minutes).  Arriving late affects you and other patients whose appointments are after yours.  Also, if you miss three or more appointments without notifying the office, you may be dismissed from the clinic at the providers discretion.      For prescription refill requests, have your pharmacy contact our office and allow 72 hours for refills to be completed.    Today you received the following chemotherapy and/or immunotherapy agents: Paclitaxel Protein Bound (Abraxane) and Gemcitabine (Gemzar).   To help prevent nausea and vomiting after your treatment, we encourage you to take your nausea medication as directed.  BELOW ARE SYMPTOMS THAT SHOULD BE REPORTED IMMEDIATELY: *FEVER GREATER THAN 100.4 F (38 C) OR HIGHER *CHILLS OR SWEATING *NAUSEA AND VOMITING THAT IS NOT CONTROLLED WITH YOUR NAUSEA MEDICATION *UNUSUAL SHORTNESS OF BREATH *UNUSUAL BRUISING OR BLEEDING *URINARY PROBLEMS (pain or burning when urinating, or frequent urination) *BOWEL PROBLEMS (unusual diarrhea, constipation, pain near the anus) TENDERNESS IN MOUTH AND THROAT WITH OR WITHOUT PRESENCE OF ULCERS (sore throat, sores in mouth, or a toothache) UNUSUAL RASH, SWELLING OR PAIN  UNUSUAL VAGINAL DISCHARGE OR ITCHING   Items with * indicate a potential emergency and should be followed up as soon as possible or go to the Emergency Department if any problems should occur.  Please show the CHEMOTHERAPY ALERT  CARD or IMMUNOTHERAPY ALERT CARD at check-in to the Emergency Department and triage nurse.  Should you have questions after your visit or need to cancel or reschedule your appointment, please contact [facility]  Dept: [phone]  and follow the prompts.  Office hours are 8:00 a.m. to 4:30 p.m. Monday - Friday. Please note that voicemails left after 4:00 p.m. may not be returned until the following business day.  We are closed weekends and major holidays. You have access to a nurse at all times for urgent questions. Please call the main number to the clinic Dept: [phone] and follow the prompts.   For any non-urgent questions, you may also contact your provider using MyChart. We now offer e-Visits for anyone 14 and older to request care online for non-urgent symptoms. For details visit [email or web address].   Also download the MyChart app! Go to the app store, search "MyChart", open the app, select , and log in with your MyChart username and password.  Due to Covid, a mask is required upon entering the hospital/clinic. If you do not have a mask, one will be given to you upon arrival. For doctor visits, patients may have 1 support person aged 28 or older with them. For treatment visits, patients cannot have anyone with them due to current Covid guidelines and our immunocompromised population.

## 2021-11-19 LAB — CANCER ANTIGEN 19-9: CA 19-9: 1131 U/mL — ABNORMAL HIGH (ref 0–35)

## 2021-11-20 ENCOUNTER — Encounter: Payer: Self-pay | Admitting: Hematology

## 2021-11-20 ENCOUNTER — Telehealth: Payer: Self-pay | Admitting: Hematology

## 2021-11-20 NOTE — Progress Notes (Signed)
Certified letter send for 3 unsuccessful attempts to contact patient.

## 2021-11-20 NOTE — Telephone Encounter (Signed)
Scheduled follow-up appointments per 12/13 los. Patient is aware.

## 2021-11-25 ENCOUNTER — Inpatient Hospital Stay: Payer: 59 | Admitting: Dietician

## 2021-11-25 ENCOUNTER — Other Ambulatory Visit: Payer: Self-pay

## 2021-11-25 ENCOUNTER — Inpatient Hospital Stay: Payer: 59

## 2021-11-25 ENCOUNTER — Other Ambulatory Visit: Payer: 59

## 2021-11-25 VITALS — BP 148/78 | HR 93 | Temp 98.5°F | Resp 18 | Wt 306.8 lb

## 2021-11-25 DIAGNOSIS — Z5111 Encounter for antineoplastic chemotherapy: Secondary | ICD-10-CM | POA: Diagnosis not present

## 2021-11-25 DIAGNOSIS — C787 Secondary malignant neoplasm of liver and intrahepatic bile duct: Secondary | ICD-10-CM

## 2021-11-25 DIAGNOSIS — Z95828 Presence of other vascular implants and grafts: Secondary | ICD-10-CM

## 2021-11-25 LAB — CBC WITH DIFFERENTIAL (CANCER CENTER ONLY)
Abs Immature Granulocytes: 0.05 10*3/uL (ref 0.00–0.07)
Basophils Absolute: 0 10*3/uL (ref 0.0–0.1)
Basophils Relative: 0 %
Eosinophils Absolute: 0.1 10*3/uL (ref 0.0–0.5)
Eosinophils Relative: 2 %
HCT: 23 % — ABNORMAL LOW (ref 36.0–46.0)
Hemoglobin: 7.1 g/dL — ABNORMAL LOW (ref 12.0–15.0)
Immature Granulocytes: 1 %
Lymphocytes Relative: 32 %
Lymphs Abs: 1.7 10*3/uL (ref 0.7–4.0)
MCH: 25.9 pg — ABNORMAL LOW (ref 26.0–34.0)
MCHC: 30.9 g/dL (ref 30.0–36.0)
MCV: 83.9 fL (ref 80.0–100.0)
Monocytes Absolute: 0.4 10*3/uL (ref 0.1–1.0)
Monocytes Relative: 7 %
Neutro Abs: 3.1 10*3/uL (ref 1.7–7.7)
Neutrophils Relative %: 58 %
Platelet Count: 251 10*3/uL (ref 150–400)
RBC: 2.74 MIL/uL — ABNORMAL LOW (ref 3.87–5.11)
RDW: 17.4 % — ABNORMAL HIGH (ref 11.5–15.5)
WBC Count: 5.3 10*3/uL (ref 4.0–10.5)
nRBC: 0.4 % — ABNORMAL HIGH (ref 0.0–0.2)

## 2021-11-25 LAB — CMP (CANCER CENTER ONLY)
ALT: 10 U/L (ref 0–44)
AST: 16 U/L (ref 15–41)
Albumin: 2.5 g/dL — ABNORMAL LOW (ref 3.5–5.0)
Alkaline Phosphatase: 63 U/L (ref 38–126)
Anion gap: 7 (ref 5–15)
BUN: 8 mg/dL (ref 6–20)
CO2: 27 mmol/L (ref 22–32)
Calcium: 8.2 mg/dL — ABNORMAL LOW (ref 8.9–10.3)
Chloride: 107 mmol/L (ref 98–111)
Creatinine: 1.17 mg/dL — ABNORMAL HIGH (ref 0.44–1.00)
GFR, Estimated: 54 mL/min — ABNORMAL LOW (ref >60)
Glucose, Bld: 330 mg/dL — ABNORMAL HIGH (ref 70–99)
Potassium: 4 mmol/L (ref 3.5–5.1)
Sodium: 141 mmol/L (ref 135–145)
Total Bilirubin: 0.3 mg/dL (ref 0.3–1.2)
Total Protein: 6.8 g/dL (ref 6.5–8.1)

## 2021-11-25 LAB — IRON AND IRON BINDING CAPACITY (CC-WL,HP ONLY)
Iron: 33 ug/dL (ref 28–170)
Saturation Ratios: 20 % (ref 10.4–31.8)
TIBC: 162 ug/dL — ABNORMAL LOW (ref 250–450)
UIBC: 129 ug/dL — ABNORMAL LOW (ref 148–442)

## 2021-11-25 LAB — FERRITIN: Ferritin: 289 ng/mL (ref 11–307)

## 2021-11-25 LAB — PREPARE RBC (CROSSMATCH)

## 2021-11-25 MED ORDER — SODIUM CHLORIDE 0.9 % IV SOLN: Freq: Once | INTRAVENOUS | Status: AC

## 2021-11-25 MED ORDER — SODIUM CHLORIDE 0.9% FLUSH
10.0000 mL | INTRAVENOUS | Status: DC | PRN
  Administered 2021-11-25: 17:00:00 10 mL

## 2021-11-25 MED ORDER — SODIUM CHLORIDE 0.9% FLUSH
10.0000 mL | Freq: Once | INTRAVENOUS | Status: AC
  Administered 2021-11-25: 14:00:00 10 mL

## 2021-11-25 MED ORDER — PALONOSETRON HCL INJECTION 0.25 MG/5ML
0.2500 mg | Freq: Once | INTRAVENOUS | Status: AC
  Administered 2021-11-25: 15:00:00 0.25 mg via INTRAVENOUS
  Filled 2021-11-25: qty 5

## 2021-11-25 MED ORDER — SODIUM CHLORIDE 0.9 % IV SOLN
1000.0000 mg/m2 | Freq: Once | INTRAVENOUS | Status: AC
  Administered 2021-11-25: 17:00:00 2698 mg via INTRAVENOUS
  Filled 2021-11-25: qty 70.96

## 2021-11-25 MED ORDER — PACLITAXEL PROTEIN-BOUND CHEMO INJECTION 100 MG
100.0000 mg/m2 | Freq: Once | INTRAVENOUS | Status: AC
  Administered 2021-11-25: 16:00:00 275 mg via INTRAVENOUS
  Filled 2021-11-25: qty 55

## 2021-11-25 MED ORDER — HEPARIN SOD (PORK) LOCK FLUSH 100 UNIT/ML IV SOLN
500.0000 [IU] | Freq: Once | INTRAVENOUS | Status: AC | PRN
  Administered 2021-11-25: 17:00:00 500 [IU]

## 2021-11-25 MED ORDER — PROCHLORPERAZINE MALEATE 10 MG PO TABS
10.0000 mg | ORAL_TABLET | Freq: Once | ORAL | Status: AC
  Administered 2021-11-25: 15:00:00 10 mg via ORAL
  Filled 2021-11-25: qty 1

## 2021-11-25 NOTE — Progress Notes (Signed)
Per Dr. Burr Medico, "OK To Treat w/Hgb 7.1".

## 2021-11-25 NOTE — Progress Notes (Signed)
Nutrition Follow-up:  Patient receiving gemcitabine and dose-reduced abraxane for stage IV pancreatic cancer.   Met with patient during infusion. She reports appetite has improved and nausea has "leveled off" Patient continues to have altered taste, unable to tolerate chicken or beef. Patient likes fish, smoked spicy sausage, ate small bowl of chili with 1/2 sleeve of crackers for dinner last night. This tasted good to her. She is eating 3 small meals day, dinner meal has been challenging to eat. Patient usually has bowl of cereal or unfrosted poptart and carnation breakfast essential for breakfast, drinking smoothies for lunch.   Medications: remeron, zofran, compazine  Labs: glucose 330, Cr 1.17  Anthropometrics: Weight 306 lb 12.8 oz today decreased from 310 lb on 12/13  Weights have decreased 7.3% in the last 6 weeks; significant   11/25 - 319 lb 4 oz 11/8 - 330 lb 14.4 oz   NUTRITION DIAGNOSIS: Inadequate oral intake continues   INTERVENTION:  Reviewed strategies for increasing calories and protein with small frequent meals and snacks Educated to include source of protein with meals/snacks for glucose control Continue drinking carnation breakfast essentials, recommend increasing to twice daily Reviewed strategies for altered taste - pt has handout  Continue baking soda salt water rinses several times day Provided 3 knitted hats from [facility] [facility] support services Patient has contact information    MONITORING, EVALUATION, GOAL: weight trends, intake   NEXT VISIT: Wednesday January 11 during infusion

## 2021-11-25 NOTE — Patient Instructions (Signed)
[facility]  Discharge Instructions: Thank you for choosing [facility] to provide your oncology and hematology care.   If you have a lab appointment with the [facility], please go directly to the [facility] and check in at the registration area.   Wear comfortable clothing and clothing appropriate for easy access to any Portacath or PICC line.   We strive to give you quality time with your provider. You may need to reschedule your appointment if you arrive late (15 or more minutes).  Arriving late affects you and other patients whose appointments are after yours.  Also, if you miss three or more appointments without notifying the office, you may be dismissed from the clinic at the providers discretion.      For prescription refill requests, have your pharmacy contact our office and allow 72 hours for refills to be completed.    Today you received the following chemotherapy and/or immunotherapy agents Abraxane, Gemcitabine.       To help prevent nausea and vomiting after your treatment, we encourage you to take your nausea medication as directed.  BELOW ARE SYMPTOMS THAT SHOULD BE REPORTED IMMEDIATELY: *FEVER GREATER THAN 100.4 F (38 C) OR HIGHER *CHILLS OR SWEATING *NAUSEA AND VOMITING THAT IS NOT CONTROLLED WITH YOUR NAUSEA MEDICATION *UNUSUAL SHORTNESS OF BREATH *UNUSUAL BRUISING OR BLEEDING *URINARY PROBLEMS (pain or burning when urinating, or frequent urination) *BOWEL PROBLEMS (unusual diarrhea, constipation, pain near the anus) TENDERNESS IN MOUTH AND THROAT WITH OR WITHOUT PRESENCE OF ULCERS (sore throat, sores in mouth, or a toothache) UNUSUAL RASH, SWELLING OR PAIN  UNUSUAL VAGINAL DISCHARGE OR ITCHING   Items with * indicate a potential emergency and should be followed up as soon as possible or go to the Emergency Department if any problems should occur.  Please show the CHEMOTHERAPY ALERT CARD or IMMUNOTHERAPY ALERT CARD at  check-in to the Emergency Department and triage nurse.  Should you have questions after your visit or need to cancel or reschedule your appointment, please contact [facility]  Dept: [phone]  and follow the prompts.  Office hours are 8:00 a.m. to 4:30 p.m. Monday - Friday. Please note that voicemails left after 4:00 p.m. may not be returned until the following business day.  We are closed weekends and major holidays. You have access to a nurse at all times for urgent questions. Please call the main number to the clinic Dept: [phone] and follow the prompts.   For any non-urgent questions, you may also contact your provider using MyChart. We now offer e-Visits for anyone 15 and older to request care online for non-urgent symptoms. For details visit [email or web address].   Also download the MyChart app! Go to the app store, search "MyChart", open the app, select [facility], and log in with your MyChart username and password.  Due to Covid, a mask is required upon entering the hospital/clinic. If you do not have a mask, one will be given to you upon arrival. For doctor visits, patients may have 1 support person aged 85 or older with them. For treatment visits, patients cannot have anyone with them due to current Covid guidelines and our immunocompromised population.   Nanoparticle Albumin-Bound Paclitaxel injection What is this medication? NANOPARTICLE ALBUMIN-BOUND PACLITAXEL (Na no PAHR ti kuhl  al BYOO muhn-bound  PAK li TAX el) is a chemotherapy drug. It targets fast dividing cells, like cancer cells, and causes these cells to die. This medicine is used  to treat advanced breast cancer, lung cancer, and pancreatic cancer. This medicine may be used for other purposes; ask your health care provider or pharmacist if you have questions. COMMON BRAND NAME(S): Abraxane What should I tell my care team before I take this medication? They need to know if you have  any of these conditions: kidney disease liver disease low blood counts, like low white cell, platelet, or red cell counts lung or breathing disease, like asthma tingling of the fingers or toes, or other nerve disorder an unusual or allergic reaction to paclitaxel, albumin, other chemotherapy, other medicines, foods, dyes, or preservatives pregnant or trying to get pregnant breast-feeding How should I use this medication? This drug is given as an infusion into a vein. It is administered in a hospital or clinic by a specially trained health care professional. Talk to your pediatrician regarding the use of this medicine in children. Special care may be needed. Overdosage: If you think you have taken too much of this medicine contact a poison control center or emergency room at once. NOTE: This medicine is only for you. Do not share this medicine with others. What if I miss a dose? It is important not to miss your dose. Call your doctor or health care professional if you are unable to keep an appointment. What may interact with this medication? This medicine may interact with the following medications: antiviral medicines for hepatitis, HIV or AIDS certain antibiotics like erythromycin and clarithromycin certain medicines for fungal infections like ketoconazole and itraconazole certain medicines for seizures like carbamazepine, phenobarbital, phenytoin gemfibrozil nefazodone rifampin St. John's wort This list may not describe all possible interactions. Give your health care provider a list of all the medicines, herbs, non-prescription drugs, or dietary supplements you use. Also tell them if you smoke, drink alcohol, or use illegal drugs. Some items may interact with your medicine. What should I watch for while using this medication? Your condition will be monitored carefully while you are receiving this medicine. You will need important blood work done while you are taking this  medicine. This medicine can cause serious allergic reactions. If you experience allergic reactions like skin rash, itching or hives, swelling of the face, lips, or tongue, tell your doctor or health care professional right away. In some cases, you may be given additional medicines to help with side effects. Follow all directions for their use. This drug may make you feel generally unwell. This is not uncommon, as chemotherapy can affect healthy cells as well as cancer cells. Report any side effects. Continue your course of treatment even though you feel ill unless your doctor tells you to stop. Call your doctor or health care professional for advice if you get a fever, chills or sore throat, or other symptoms of a cold or flu. Do not treat yourself. This drug decreases your body's ability to fight infections. Try to avoid being around people who are sick. This medicine may increase your risk to bruise or bleed. Call your doctor or health care professional if you notice any unusual bleeding. Be careful brushing and flossing your teeth or using a toothpick because you may get an infection or bleed more easily. If you have any dental work done, tell your dentist you are receiving this medicine. Avoid taking products that contain aspirin, acetaminophen, ibuprofen, naproxen, or ketoprofen unless instructed by your doctor. These medicines may hide a fever. Do not become pregnant while taking this medicine or for 6 months after stopping it. Women should  inform their doctor if they wish to become pregnant or think they might be pregnant. Men should not father a child while taking this medicine or for 3 months after stopping it. There is a potential for serious side effects to an unborn child. Talk to your health care professional or pharmacist for more information. Do not breast-feed an infant while taking this medicine or for 2 weeks after stopping it. This medicine may interfere with the ability to get pregnant  or to father a child. You should talk to your doctor or health care professional if you are concerned about your fertility. What side effects may I notice from receiving this medication? Side effects that you should report to your doctor or health care professional as soon as possible: allergic reactions like skin rash, itching or hives, swelling of the face, lips, or tongue breathing problems changes in vision fast, irregular heartbeat low blood pressure mouth sores pain, tingling, numbness in the hands or feet signs of decreased platelets or bleeding - bruising, pinpoint red spots on the skin, black, tarry stools, blood in the urine signs of decreased red blood cells - unusually weak or tired, feeling faint or lightheaded, falls signs of infection - fever or chills, cough, sore throat, pain or difficulty passing urine signs and symptoms of liver injury like dark yellow or brown urine; general ill feeling or flu-like symptoms; light-colored stools; loss of appetite; nausea; right upper belly pain; unusually weak or tired; yellowing of the eyes or skin swelling of the ankles, feet, hands unusually slow heartbeat Side effects that usually do not require medical attention (report to your doctor or health care professional if they continue or are bothersome): diarrhea hair loss loss of appetite nausea, vomiting tiredness This list may not describe all possible side effects. Call your doctor for medical advice about side effects. You may report side effects to FDA at 1-800-FDA-1088. Where should I keep my medication? This drug is given in a hospital or clinic and will not be stored at home. NOTE: This sheet is a summary. It may not cover all possible information. If you have questions about this medicine, talk to your doctor, pharmacist, or health care provider.  2022 Elsevier/Gold Standard (2017-07-27 00:00:00)  Gemcitabine injection What is this medication? GEMCITABINE (jem SYE ta been)  is a chemotherapy drug. This medicine is used to treat many types of cancer like breast cancer, lung cancer, pancreatic cancer, and ovarian cancer. This medicine may be used for other purposes; ask your health care provider or pharmacist if you have questions. COMMON BRAND NAME(S): Gemzar, Infugem What should I tell my care team before I take this medication? They need to know if you have any of these conditions: blood disorders infection kidney disease liver disease lung or breathing disease, like asthma recent or ongoing radiation therapy an unusual or allergic reaction to gemcitabine, other chemotherapy, other medicines, foods, dyes, or preservatives pregnant or trying to get pregnant breast-feeding How should I use this medication? This drug is given as an infusion into a vein. It is administered in a hospital or clinic by a specially trained health care professional. Talk to your pediatrician regarding the use of this medicine in children. Special care may be needed. Overdosage: If you think you have taken too much of this medicine contact a poison control center or emergency room at once. NOTE: This medicine is only for you. Do not share this medicine with others. What if I miss a dose? It is important not  to miss your dose. Call your doctor or health care professional if you are unable to keep an appointment. What may interact with this medication? medicines to increase blood counts like filgrastim, pegfilgrastim, sargramostim some other chemotherapy drugs like cisplatin vaccines Talk to your doctor or health care professional before taking any of these medicines: acetaminophen aspirin ibuprofen ketoprofen naproxen This list may not describe all possible interactions. Give your health care provider a list of all the medicines, herbs, non-prescription drugs, or dietary supplements you use. Also tell them if you smoke, drink alcohol, or use illegal drugs. Some items may interact  with your medicine. What should I watch for while using this medication? Visit your doctor for checks on your progress. This drug may make you feel generally unwell. This is not uncommon, as chemotherapy can affect healthy cells as well as cancer cells. Report any side effects. Continue your course of treatment even though you feel ill unless your doctor tells you to stop. In some cases, you may be given additional medicines to help with side effects. Follow all directions for their use. Call your doctor or health care professional for advice if you get a fever, chills or sore throat, or other symptoms of a cold or flu. Do not treat yourself. This drug decreases your body's ability to fight infections. Try to avoid being around people who are sick. This medicine may increase your risk to bruise or bleed. Call your doctor or health care professional if you notice any unusual bleeding. Be careful brushing and flossing your teeth or using a toothpick because you may get an infection or bleed more easily. If you have any dental work done, tell your dentist you are receiving this medicine. Avoid taking products that contain aspirin, acetaminophen, ibuprofen, naproxen, or ketoprofen unless instructed by your doctor. These medicines may hide a fever. Do not become pregnant while taking this medicine or for 6 months after stopping it. Women should inform their doctor if they wish to become pregnant or think they might be pregnant. Men should not father a child while taking this medicine and for 3 months after stopping it. There is a potential for serious side effects to an unborn child. Talk to your health care professional or pharmacist for more information. Do not breast-feed an infant while taking this medicine or for at least 1 week after stopping it. Men should inform their doctors if they wish to father a child. This medicine may lower sperm counts. Talk with your doctor or health care professional if you are  concerned about your fertility. What side effects may I notice from receiving this medication? Side effects that you should report to your doctor or health care professional as soon as possible: allergic reactions like skin rash, itching or hives, swelling of the face, lips, or tongue breathing problems pain, redness, or irritation at site where injected signs and symptoms of a dangerous change in heartbeat or heart rhythm like chest pain; dizziness; fast or irregular heartbeat; palpitations; feeling faint or lightheaded, falls; breathing problems signs of decreased platelets or bleeding - bruising, pinpoint red spots on the skin, black, tarry stools, blood in the urine signs of decreased red blood cells - unusually weak or tired, feeling faint or lightheaded, falls signs of infection - fever or chills, cough, sore throat, pain or difficulty passing urine signs and symptoms of kidney injury like trouble passing urine or change in the amount of urine signs and symptoms of liver injury like dark yellow or  brown urine; general ill feeling or flu-like symptoms; light-colored stools; loss of appetite; nausea; right upper belly pain; unusually weak or tired; yellowing of the eyes or skin swelling of ankles, feet, hands Side effects that usually do not require medical attention (report to your doctor or health care professional if they continue or are bothersome): constipation diarrhea hair loss loss of appetite nausea rash vomiting This list may not describe all possible side effects. Call your doctor for medical advice about side effects. You may report side effects to FDA at 1-800-FDA-1088. Where should I keep my medication? This drug is given in a hospital or clinic and will not be stored at home. NOTE: This sheet is a summary. It may not cover all possible information. If you have questions about this medicine, talk to your doctor, pharmacist, or health care provider.  2022 Elsevier/Gold  Standard (2018-02-16 00:00:00)

## 2021-11-26 ENCOUNTER — Inpatient Hospital Stay: Payer: 59

## 2021-11-26 ENCOUNTER — Other Ambulatory Visit: Payer: Self-pay | Admitting: Hematology

## 2021-11-26 DIAGNOSIS — Z5111 Encounter for antineoplastic chemotherapy: Secondary | ICD-10-CM | POA: Diagnosis not present

## 2021-11-26 DIAGNOSIS — C259 Malignant neoplasm of pancreas, unspecified: Secondary | ICD-10-CM

## 2021-11-26 DIAGNOSIS — C787 Secondary malignant neoplasm of liver and intrahepatic bile duct: Secondary | ICD-10-CM

## 2021-11-26 MED ORDER — DIPHENHYDRAMINE HCL 25 MG PO CAPS
25.0000 mg | ORAL_CAPSULE | Freq: Once | ORAL | Status: AC
  Administered 2021-11-26: 14:00:00 25 mg via ORAL
  Filled 2021-11-26: qty 1

## 2021-11-26 MED ORDER — SODIUM CHLORIDE 0.9% FLUSH
10.0000 mL | Freq: Once | INTRAVENOUS | Status: AC
  Administered 2021-11-26: 16:00:00 10 mL via INTRAVENOUS

## 2021-11-26 MED ORDER — SODIUM CHLORIDE 0.9% IV SOLUTION
250.0000 mL | Freq: Once | INTRAVENOUS | Status: AC
  Administered 2021-11-26: 14:00:00 250 mL via INTRAVENOUS

## 2021-11-26 MED ORDER — ACETAMINOPHEN 325 MG PO TABS
650.0000 mg | ORAL_TABLET | Freq: Once | ORAL | Status: AC
  Administered 2021-11-26: 14:00:00 650 mg via ORAL
  Filled 2021-11-26: qty 2

## 2021-11-26 MED ORDER — HEPARIN SOD (PORK) LOCK FLUSH 100 UNIT/ML IV SOLN
500.0000 [IU] | Freq: Once | INTRAVENOUS | Status: AC
  Administered 2021-11-26: 16:00:00 500 [IU] via INTRAVENOUS

## 2021-11-26 NOTE — Patient Instructions (Signed)
Blood Transfusion, Adult, Care After This sheet gives you information about how to care for yourself after your procedure. Your doctor may also give you more specific instructions. If you have problems or questions, contact your doctor. What can I expect after the procedure? After the procedure, it is common to have: Bruising and soreness at the IV site. A headache. Follow these instructions at home: Insertion site care   Follow instructions from your doctor about how to take care of your insertion site. This is where an IV tube was put into your vein. Make sure you: Wash your hands with soap and water before and after you change your bandage (dressing). If you cannot use soap and water, use hand sanitizer. Change your bandage as told by your doctor. Check your insertion site every day for signs of infection. Check for: Redness, swelling, or pain. Bleeding from the site. Warmth. Pus or a bad smell. General instructions Take over-the-counter and prescription medicines only as told by your doctor. Rest as told by your doctor. Go back to your normal activities as told by your doctor. Keep all follow-up visits as told by your doctor. This is important. Contact a doctor if: You have itching or red, swollen areas of skin (hives). You feel worried or nervous (anxious). You feel weak after doing your normal activities. You have redness, swelling, warmth, or pain around the insertion site. You have blood coming from the insertion site, and the blood does not stop with pressure. You have pus or a bad smell coming from the insertion site. Get help right away if: You have signs of a serious reaction. This may be coming from an allergy or the body's defense system (immune system). Signs include: Trouble breathing or shortness of breath. Swelling of the face or feeling warm (flushed). Fever or chills. Head, chest, or back pain. Dark pee (urine) or blood in the pee. Widespread rash. Fast  heartbeat. Feeling dizzy or light-headed. You may receive your blood transfusion in an outpatient setting. If so, you will be told whom to contact to report any reactions. These symptoms may be an emergency. Do not wait to see if the symptoms will go away. Get medical help right away. Call your local emergency services (911 in the [place] Do not drive yourself to the hospital. Summary Bruising and soreness at the IV site are common. Check your insertion site every day for signs of infection. Rest as told by your doctor. Go back to your normal activities as told by your doctor. Get help right away if you have signs of a serious reaction. This information is not intended to replace advice given to you by your health care provider. Make sure you discuss any questions you have with your health care provider. Document Revised: 03/20/2021 Document Reviewed: 05/18/2019 Elsevier Patient Education  2022 Elsevier Inc.  

## 2021-11-27 ENCOUNTER — Other Ambulatory Visit: Payer: Self-pay | Admitting: Hematology

## 2021-11-27 LAB — TYPE AND SCREEN
ABO/RH(D): B POS
Antibody Screen: NEGATIVE
Unit division: 0

## 2021-11-27 LAB — BPAM RBC
Blood Product Expiration Date: 202212232359
ISSUE DATE / TIME: 202212211404
Unit Type and Rh: 1700

## 2021-11-27 MED ORDER — OXYCODONE HCL 10 MG PO TABS: 10.0000 mg | ORAL_TABLET | Freq: Four times a day (QID) | ORAL | 0 refills | Status: DC | PRN

## 2021-12-09 ENCOUNTER — Encounter: Payer: Self-pay | Admitting: Hematology

## 2021-12-09 ENCOUNTER — Encounter: Payer: Self-pay | Admitting: Licensed Clinical Social Worker

## 2021-12-09 NOTE — Telephone Encounter (Signed)
Attempted to reach Ms. Leslie Mullen x3 with genetic test results. Sent patient letter with instructions to contact me should she still want her genetic test results.

## 2021-12-11 ENCOUNTER — Encounter: Payer: Self-pay | Admitting: Hematology

## 2021-12-11 ENCOUNTER — Inpatient Hospital Stay: Payer: 59 | Attending: Gynecologic Oncology

## 2021-12-11 ENCOUNTER — Other Ambulatory Visit: Payer: Self-pay

## 2021-12-11 ENCOUNTER — Inpatient Hospital Stay (HOSPITAL_BASED_OUTPATIENT_CLINIC_OR_DEPARTMENT_OTHER): Payer: 59 | Admitting: Hematology

## 2021-12-11 ENCOUNTER — Inpatient Hospital Stay: Payer: 59

## 2021-12-11 VITALS — BP 127/61 | HR 95 | Temp 98.5°F | Resp 18 | Ht 68.0 in | Wt 310.7 lb

## 2021-12-11 DIAGNOSIS — C259 Malignant neoplasm of pancreas, unspecified: Secondary | ICD-10-CM

## 2021-12-11 DIAGNOSIS — Z5111 Encounter for antineoplastic chemotherapy: Secondary | ICD-10-CM | POA: Diagnosis present

## 2021-12-11 DIAGNOSIS — C787 Secondary malignant neoplasm of liver and intrahepatic bile duct: Secondary | ICD-10-CM | POA: Diagnosis present

## 2021-12-11 DIAGNOSIS — C250 Malignant neoplasm of head of pancreas: Secondary | ICD-10-CM | POA: Insufficient documentation

## 2021-12-11 DIAGNOSIS — Z95828 Presence of other vascular implants and grafts: Secondary | ICD-10-CM

## 2021-12-11 DIAGNOSIS — Z79899 Other long term (current) drug therapy: Secondary | ICD-10-CM | POA: Diagnosis not present

## 2021-12-11 LAB — CBC WITH DIFFERENTIAL (CANCER CENTER ONLY)
Abs Immature Granulocytes: 0.03 10*3/uL (ref 0.00–0.07)
Basophils Absolute: 0 10*3/uL (ref 0.0–0.1)
Basophils Relative: 0 %
Eosinophils Absolute: 0.5 10*3/uL (ref 0.0–0.5)
Eosinophils Relative: 5 %
HCT: 26.4 % — ABNORMAL LOW (ref 36.0–46.0)
Hemoglobin: 8.1 g/dL — ABNORMAL LOW (ref 12.0–15.0)
Immature Granulocytes: 0 %
Lymphocytes Relative: 29 %
Lymphs Abs: 2.6 10*3/uL (ref 0.7–4.0)
MCH: 27 pg (ref 26.0–34.0)
MCHC: 30.7 g/dL (ref 30.0–36.0)
MCV: 88 fL (ref 80.0–100.0)
Monocytes Absolute: 1.1 10*3/uL — ABNORMAL HIGH (ref 0.1–1.0)
Monocytes Relative: 12 %
Neutro Abs: 4.9 10*3/uL (ref 1.7–7.7)
Neutrophils Relative %: 54 %
Platelet Count: 795 10*3/uL — ABNORMAL HIGH (ref 150–400)
RBC: 3 MIL/uL — ABNORMAL LOW (ref 3.87–5.11)
RDW: 22.4 % — ABNORMAL HIGH (ref 11.5–15.5)
WBC Count: 9.1 10*3/uL (ref 4.0–10.5)
nRBC: 0 % (ref 0.0–0.2)

## 2021-12-11 LAB — CMP (CANCER CENTER ONLY)
ALT: 7 U/L (ref 0–44)
AST: 13 U/L — ABNORMAL LOW (ref 15–41)
Albumin: 2.4 g/dL — ABNORMAL LOW (ref 3.5–5.0)
Alkaline Phosphatase: 76 U/L (ref 38–126)
Anion gap: 7 (ref 5–15)
BUN: 14 mg/dL (ref 6–20)
CO2: 26 mmol/L (ref 22–32)
Calcium: 8.3 mg/dL — ABNORMAL LOW (ref 8.9–10.3)
Chloride: 109 mmol/L (ref 98–111)
Creatinine: 1.07 mg/dL — ABNORMAL HIGH (ref 0.44–1.00)
GFR, Estimated: 60 mL/min — ABNORMAL LOW (ref >60)
Glucose, Bld: 195 mg/dL — ABNORMAL HIGH (ref 70–99)
Potassium: 4.2 mmol/L (ref 3.5–5.1)
Sodium: 142 mmol/L (ref 135–145)
Total Bilirubin: 0.3 mg/dL (ref 0.3–1.2)
Total Protein: 6.9 g/dL (ref 6.5–8.1)

## 2021-12-11 MED ORDER — MIRTAZAPINE 15 MG PO TABS
15.0000 mg | ORAL_TABLET | Freq: Every day | ORAL | 2 refills | Status: AC
Start: 2021-12-11 — End: ?

## 2021-12-11 MED ORDER — PROCHLORPERAZINE MALEATE 10 MG PO TABS
10.0000 mg | ORAL_TABLET | Freq: Once | ORAL | Status: AC
  Administered 2021-12-11: 10 mg via ORAL
  Filled 2021-12-11: qty 1

## 2021-12-11 MED ORDER — SODIUM CHLORIDE 0.9% FLUSH
10.0000 mL | INTRAVENOUS | Status: DC | PRN
  Administered 2021-12-11: 10 mL

## 2021-12-11 MED ORDER — SODIUM CHLORIDE 0.9% FLUSH
10.0000 mL | Freq: Once | INTRAVENOUS | Status: AC
  Administered 2021-12-11: 10 mL

## 2021-12-11 MED ORDER — PALONOSETRON HCL INJECTION 0.25 MG/5ML
0.2500 mg | Freq: Once | INTRAVENOUS | Status: AC
  Administered 2021-12-11: 0.25 mg via INTRAVENOUS
  Filled 2021-12-11: qty 5

## 2021-12-11 MED ORDER — PACLITAXEL PROTEIN-BOUND CHEMO INJECTION 100 MG
100.0000 mg/m2 | Freq: Once | INTRAVENOUS | Status: AC
  Administered 2021-12-11: 275 mg via INTRAVENOUS
  Filled 2021-12-11: qty 55

## 2021-12-11 MED ORDER — HEPARIN SOD (PORK) LOCK FLUSH 100 UNIT/ML IV SOLN
500.0000 [IU] | Freq: Once | INTRAVENOUS | Status: AC | PRN
  Administered 2021-12-11: 500 [IU]

## 2021-12-11 MED ORDER — OXYCODONE HCL 10 MG PO TABS: 10.0000 mg | ORAL_TABLET | Freq: Four times a day (QID) | ORAL | 0 refills | Status: DC | PRN

## 2021-12-11 MED ORDER — SODIUM CHLORIDE 0.9 % IV SOLN: Freq: Once | INTRAVENOUS | Status: AC

## 2021-12-11 MED ORDER — SODIUM CHLORIDE 0.9 % IV SOLN
1000.0000 mg/m2 | Freq: Once | INTRAVENOUS | Status: AC
  Administered 2021-12-11: 2698 mg via INTRAVENOUS
  Filled 2021-12-11: qty 70.96

## 2021-12-11 NOTE — Patient Instructions (Signed)
[facility]   ?Discharge Instructions: ?Thank you for choosing [facility] to provide your oncology and hematology care.  ? ?If you have a lab appointment with the Cancer Center, please go directly to the Cancer Center and check in at the registration area. ?  ?Wear comfortable clothing and clothing appropriate for easy access to any Portacath or PICC line.  ? ?We strive to give you quality time with your provider. You may need to reschedule your appointment if you arrive late (15 or more minutes).  Arriving late affects you and other patients whose appointments are after yours.  Also, if you miss three or more appointments without notifying the office, you may be dismissed from the clinic at the provider?s discretion.    ?  ?For prescription refill requests, have your pharmacy contact our office and allow 72 hours for refills to be completed.   ? ?Today you received the following chemotherapy and/or immunotherapy agents: Paclitaxel protein-bound (Abraxane) and Gemcitabine (Gemzar)    ?  ?To help prevent nausea and vomiting after your treatment, we encourage you to take your nausea medication as directed. ? ?BELOW ARE SYMPTOMS THAT SHOULD BE REPORTED IMMEDIATELY: ?*FEVER GREATER THAN 100.4 F (38 ?C) OR HIGHER ?*CHILLS OR SWEATING ?*NAUSEA AND VOMITING THAT IS NOT CONTROLLED WITH YOUR NAUSEA MEDICATION ?*UNUSUAL SHORTNESS OF BREATH ?*UNUSUAL BRUISING OR BLEEDING ?*URINARY PROBLEMS (pain or burning when urinating, or frequent urination) ?*BOWEL PROBLEMS (unusual diarrhea, constipation, pain near the anus) ?TENDERNESS IN MOUTH AND THROAT WITH OR WITHOUT PRESENCE OF ULCERS (sore throat, sores in mouth, or a toothache) ?UNUSUAL RASH, SWELLING OR PAIN  ?UNUSUAL VAGINAL DISCHARGE OR ITCHING  ? ?Items with * indicate a potential emergency and should be followed up as soon as possible or go to the Emergency Department if any problems should occur. ? ?Please show the CHEMOTHERAPY  ALERT CARD or IMMUNOTHERAPY ALERT CARD at check-in to the Emergency Department and triage nurse. ? ?Should you have questions after your visit or need to cancel or reschedule your appointment, please contact [facility]  Dept: [phone]  and follow the prompts.  Office hours are 8:00 a.m. to 4:30 p.m. Monday - Friday. Please note that voicemails left after 4:00 p.m. may not be returned until the following business day.  We are closed weekends and major holidays. You have access to a nurse at all times for urgent questions. Please call the main number to the clinic Dept: [phone] and follow the prompts. ? ? ?For any non-urgent questions, you may also contact your provider using MyChart. We now offer e-Visits for anyone 18 and older to request care online for non-urgent symptoms. For details visit [email or web address]. ?  ?Also download the MyChart app! Go to the app store, search "MyChart", open the app, select [facility], and log in with your MyChart username and password. ? ?Due to Covid, a mask is required upon entering the hospital/clinic. If you do not have a mask, one will be given to you upon arrival. For doctor visits, patients may have 1 support person aged 18 or older with them. For treatment visits, patients cannot have anyone with them due to current Covid guidelines and our immunocompromised population.  ? ?

## 2021-12-11 NOTE — Progress Notes (Signed)
Leslie Mullen   [phone] [phone]   Clinic Follow up Note   Patient Care Team: Elinor Parkinson as PCP - General (Physician Assistant) Vickie Epley, MD as PCP - Electrophysiology (Cardiology) Josue Hector, MD as PCP - Cardiology (Cardiology)  Date of Service:  12/11/2021  CHIEF COMPLAINT: f/u of pancreatic cancer  CURRENT THERAPY:  Gemcitabine/Abraxane days 1 and 8 q21d, started 10/23/21  ASSESSMENT & PLAN:  Leslie Mullen is a 61 y.o. female with   1. Pancreatic cancer metastasized to liver, stage IV  -initially presented to ED with abdominal/epigastric pain. [place] on 09/24/21 showed a 5.3 cm lesion adjacent to pancreatic head. Follow up CT AP showed 4.1 cm lesion in pancreatic head. She was recommended to f/u with GI. -she returned to ED on 10/05/21 and was admitted. Abd MRI showed findings of pancreatic neoplasm with hepatic metastatic disease, local nodal disease likely, local invasion of stomach and duodenum suspected, and mild stranding about pancreas. -liver biopsy on 10/08/21 confirmed poorly differentiated carcinoma, compatible with a pancreatobiliary primary.  -Chest CT 10/14/21 negative for metastatic disease. -she started first line palliative gemcitabine/abraxane on 10/23/21. She tolerated C1D1 rather poorly with severe nausea and vomiting, prompting ED visit. -CT AP performed in ED 10/25/21 showed slight enlargement to pancreatic head mass, now 6 cm, with adjacent lymphadenopathy and liver metastasis. -we decreased her dose with C2, she tolerated much better and has been able to recover well. -labs reviewed, overall stable, adequate to proceed with treatment. We will continue at same reduced dose.   2. Symptom Management: Nausea, Loss of appetite, Epigastric pain, Fatigue -N/V better managed with scheduled zofran and compazine, she has ativan if needed but hasn't taken due to being on oxycodone  -abd pain stable 4/10, managed  with oxycodone q6hr. She will take tylenol instead if she is out/at Church -appetite improved on mirtazapine. We discussed increasing her dose; she would like to do this. I will refill for her. -Weight has been overall stable during C2. -Hgb 8.1 today (12/11/21), she is not very symptomatic. Range 7.6 - 9.2 lately. Iron studies 11/25/21 showed ferritin WNL, iron at low end of normal. We will plan for repeat blood transfusion next week.   3. Goal of care discussion, Social support -she understands the incurable nature of her cancer, and the overall poor prognosis, especially if she does not have good response to chemotherapy or progress on chemo -The patient understands the goal of care is palliative. -she is full code now  -she declined need for SW, chaplain, financial or other supportive resources at this time    PLAN: -Proceed with C3D1 gem/abraxane at same dose as last cycle -Continue supportive meds at home -I called in mirtazapine 15 mg and refilled her oxycodone -Return for C2D8 next week, will add sample for blood bank to transfuse if Hgb <8 -F/up in 3 weeks with cycle 4 -restaging CT after cycle 4   No problem-specific Assessment & Plan notes found for this encounter.   SUMMARY OF ONCOLOGIC HISTORY: Oncology History Overview Note  Cancer Staging Pancreatic cancer metastasized to liver [facility]) Staging form: Exocrine Pancreas, AJCC 8th Edition - Clinical stage from 10/14/2021: Stage IV (cT3, cN1, pM1) - Signed by Truitt Merle, MD on 10/14/2021    Pancreatic cancer metastasized to liver ([place])  09/24/2021 Imaging   EXAM: ULTRASOUND ABDOMEN LIMITED RIGHT UPPER QUADRANT  IMPRESSION: 5.3 cm lesion adjacent to the pancreatic head. While this may reflect a loop of bowel,  a mass is not excluded. Consider CT abdomen with contrast for further evaluation.   09/25/2021 Imaging   EXAM: CT ABDOMEN AND PELVIS WITH CONTRAST  IMPRESSION: 4.1 cm low-density lesion in the pancreatic  head/body, possibly reflecting focal pancreatitis given the clinical history. However, this warrants short-term follow-up in 4-6 weeks to document improvement/resolution. Alternatively, EUS could be considered for tissue sampling as clinically warranted.   10/06/2021 Imaging   EXAM: MRI ABDOMEN WITHOUT AND WITH CONTRAST  IMPRESSION: Findings of pancreatic neoplasm with hepatic metastatic disease, local nodal disease likely with greater than 180 degree involvement of the portal vein, sampling of LEFT hepatic lobe lesion may be helpful to initiate further workup. Subsequent local staging could be performed with pancreatic protocol CT and EUS as indicated.   Local invasion of stomach and duodenum are suspected.   Mild stranding about the pancreas could be indicative of mild superimposed pancreatitis or local tumor extension particularly into the transverse mesocolon.   10/08/2021 Pathology Results   FINAL MICROSCOPIC DIAGNOSIS:   A. LIVER,  LEFT LOBE, BIOPSY:  -  Poorly differentiated carcinoma  -  See comment   COMMENT:  By immunohistochemistry, the neoplastic cells are positive for  cytokeratin 7 and weakly positive for HepPar.  The cells are negative for cytokeratin 20, GATA3, arginase, PAX8, CDX2 and TTF-1.  The immune O profile is nonspecific; however, the findings are compatible with a pancreatobiliary primary in the absence of other lesions.   10/14/2021 Initial Diagnosis   Pancreatic cancer metastasized to liver [facility])   10/14/2021 Cancer Staging   Staging form: Exocrine Pancreas, AJCC 8th Edition - Clinical stage from 10/14/2021: Stage IV (cT3, cN1, pM1) - Signed by Truitt Merle, MD on 10/14/2021 Stage prefix: Initial diagnosis    10/23/2021 -  Chemotherapy   Patient is on Treatment Plan : PANCREATIC Abraxane / Gemcitabine D1,8,15 q28d        INTERVAL HISTORY:  Leslie Mullen is here for a follow up of pancreatic cancer. She was last seen by NP Lacie on 11/18/21. She  presents to the clinic alone. She reports she tolerated her last cycle better than the first. She notes she was able to recover well. She notes she was able to care for herself but not do as much around the house. She reports she is eating better than she was initially. She denies numbness/tingling. I asked how long she has been in a wheelchair, she reports since her cancer diagnosis. She reports her vision is "cattywampus, it comes and it goes." She notes she is working to get a wheelchair at home for increased stability.   All other systems were reviewed with the patient and are negative.  MEDICAL HISTORY:  Past Medical History:  Diagnosis Date   Abnormal uterine bleeding    Allergy    Anemia    Asthma    Cataract    BOTH EYES   Complete heart block ([place])    a. [employer] PPM 10/2020.   Diabetes ([place])    type II    Diabetic neuropathy ([place])    Endometrial cancer ([place])    Family history of colon cancer    GERD (gastroesophageal reflux disease)    Hyperlipidemia    Hypertension    Morbid obesity ([place])    Osteoarthritis    right knee    Pancreatitis    Pneumonia     SURGICAL HISTORY: Past Surgical History:  Procedure Laterality Date   CERVICAL BIOPSY     CESAREAN SECTION  x3   COLONOSCOPY WITH PROPOFOL N/A 06/29/2017   Procedure: COLONOSCOPY WITH PROPOFOL;  Surgeon: Irene Shipper, MD;  Location: [facility];  Service: Endoscopy;  Laterality: N/A;   CYSTECTOMY Right 2010   Cyst removed from right groin.    DILATION AND CURETTAGE OF UTERUS N/A 07/29/2021   Procedure: DILATATION AND CURETTAGE OF UTERUS;  Surgeon: Everitt Amber, MD;  Location: [facility] ORS;  Service: Gynecology;  Laterality: N/A;   INTRAUTERINE DEVICE (IUD) INSERTION N/A 07/29/2021   Procedure: INTRAUTERINE DEVICE (IUD) INSERTION;  Surgeon: Everitt Amber, MD;  Location: [facility];  Service: Gynecology;  Laterality: N/A;  IUD FROM MAIN PHARMACY   IR IMAGING GUIDED PORT INSERTION  11/11/2021   PACEMAKER IMPLANT N/A  10/11/2020   Procedure: PACEMAKER IMPLANT;  Surgeon: Vickie Epley, MD;  Location: [place] [facility];  Service: Cardiovascular;  Laterality: N/A;   TONSILLECTOMY      I have reviewed the social history and family history with the patient and they are unchanged from previous note.  ALLERGIES:  is allergic to penicillins.  MEDICATIONS:  Current Outpatient Medications  Medication Sig Dispense Refill   mirtazapine (REMERON) 15 MG tablet Take 1 tablet (15 mg total) by mouth at bedtime. 30 tablet 2   amLODipine (NORVASC) 10 MG tablet Take 1 tablet (10 mg total) by mouth at bedtime. 30 tablet 0   apixaban (ELIQUIS) 5 MG TABS tablet Take 1 tablet (5 mg total) by mouth 2 (two) times daily. 60 tablet 3   gabapentin (NEURONTIN) 300 MG capsule Take 1 capsule (300 mg total) by mouth 2 (two) times daily. (Patient taking differently: Take 300 mg by mouth 3 (three) times daily.)     glucose blood test strip      insulin degludec (TRESIBA FLEXTOUCH) 200 UNIT/ML FlexTouch Pen Inject 20 Units into the skin every evening. Start at 20 units daily-if you blood glucose levels are still on the higher side-slowly increase back to your usual regimen of 80 units daily.     Insulin Pen Needle 31G X 5 MM MISC      lidocaine-prilocaine (EMLA) cream Apply to affected area once 30 g 3   loratadine (CLARITIN) 10 MG tablet Take 10 mg by mouth daily as needed for allergies.     LORazepam (ATIVAN) 0.5 MG tablet Take 1 tablet (0.5 mg total) by mouth 2 (two) times daily as needed (for anxiety, and/or chemo-related nausea/vomiting). 30 tablet 0   magic mouthwash w/lidocaine SOLN Take 5 mLs by mouth 4 (four) times daily. 470 mL 2   omeprazole (PRILOSEC) 40 MG capsule Take 40 mg by mouth 2 (two) times daily.     ondansetron (ZOFRAN) 8 MG tablet Take 1 tablet (8 mg total) by mouth 2 (two) times daily as needed (Nausea or vomiting). 30 tablet 1   ONETOUCH DELICA LANCETS 68L MISC      Oxycodone HCl 10 MG TABS Take 1 tablet (10  mg total) by mouth every 6 (six) hours as needed. 120 tablet 0   polyethylene glycol (MIRALAX / GLYCOLAX) 17 g packet Take 17 g by mouth daily as needed for moderate constipation.     pravastatin (PRAVACHOL) 40 MG tablet TAKE 1 TABLET(40 MG) BY MOUTH DAILY 90 tablet 1   prochlorperazine (COMPAZINE) 10 MG tablet Take 1 tablet (10 mg total) by mouth every 6 (six) hours as needed (Nausea or vomiting). 30 tablet 1   SYNJARDY XR 12.04-999 MG TB24 Take 1 tablet by mouth 2 (two) times daily.  trolamine salicylate (ASPERCREME) 10 % cream Apply 1 application topically 2 (two) times daily as needed for muscle pain.     No current facility-administered medications for this visit.    PHYSICAL EXAMINATION: ECOG PERFORMANCE STATUS: 2 - Symptomatic, <50% confined to bed  Vitals:   12/11/21 1225  BP: 127/61  Pulse: 95  Resp: 18  Temp: 98.5 F (36.9 C)  SpO2: 99%   Wt Readings from Last 3 Encounters:  12/11/21 (!) 310 lb 11.2 oz (140.9 kg)  11/26/21 (!) 307 lb 8 oz (139.5 kg)  11/25/21 (!) 306 lb 12.8 oz (139.2 kg)     GENERAL:alert, no distress and comfortable SKIN: skin color normal, no rashes or significant lesions EYES: normal, Conjunctiva are pink and non-injected, sclera clear  NEURO: alert & oriented x 3 with fluent speech  LABORATORY DATA:  I have reviewed the data as listed CBC Latest Ref Rng & Units 12/11/2021 11/25/2021 11/18/2021  WBC 4.0 - 10.5 K/uL 9.1 5.3 12.8(H)  Hemoglobin 12.0 - 15.0 g/dL 8.1(L) 7.1(L) 8.0(L)  Hematocrit 36.0 - 46.0 % 26.4(L) 23.0(L) 26.2(L)  Platelets 150 - 400 K/uL 795(H) 251 501(H)     CMP Latest Ref Rng & Units 12/11/2021 11/25/2021 11/18/2021  Glucose 70 - 99 mg/dL 195(H) 330(H) 122(H)  BUN 6 - 20 mg/dL 14 8 13   Creatinine 0.44 - 1.00 mg/dL 1.07(H) 1.17(H) 1.36(H)  Sodium 135 - 145 mmol/L 142 141 142  Potassium 3.5 - 5.1 mmol/L 4.2 4.0 4.0  Chloride 98 - 111 mmol/L 109 107 109  CO2 22 - 32 mmol/L 26 27 22   Calcium 8.9 - 10.3 mg/dL 8.3(L) 8.2(L)  8.1(L)  Total Protein 6.5 - 8.1 g/dL 6.9 6.8 6.8  Total Bilirubin 0.3 - 1.2 mg/dL 0.3 0.3 0.3  Alkaline Phos 38 - 126 U/L 76 63 68  AST 15 - 41 U/L 13(L) 16 15  ALT 0 - 44 U/L 7 10 7       RADIOGRAPHIC STUDIES: I have personally reviewed the radiological images as listed and agreed with the findings in the report. No results found.    Orders Placed This Encounter  Procedures   Sample to Blood Bank    Standing Status:   Future    Standing Expiration Date:   12/11/2022   All questions were answered. The patient knows to call the clinic with any problems, questions or concerns. No barriers to learning was detected. The total time spent in the appointment was 30 minutes.     Truitt Merle, MD 12/11/2021   I, Wilburn Mylar, am acting as scribe for Truitt Merle, MD.   I have reviewed the above documentation for accuracy and completeness, and I agree with the above.

## 2021-12-12 ENCOUNTER — Encounter: Payer: Self-pay | Admitting: Hematology

## 2021-12-12 LAB — CANCER ANTIGEN 19-9: CA 19-9: 248 U/mL — ABNORMAL HIGH (ref 0–35)

## 2021-12-15 ENCOUNTER — Telehealth: Payer: Self-pay | Admitting: Hematology

## 2021-12-15 NOTE — Telephone Encounter (Signed)
Left message with changed upcoming appointment per 1/5 los.

## 2021-12-16 ENCOUNTER — Inpatient Hospital Stay (HOSPITAL_COMMUNITY)
Admission: EM | Admit: 2021-12-16 | Discharge: 2021-12-20 | DRG: 812 | Disposition: A | Discharge status: Home / Self Care | Payer: 59 | Attending: Internal Medicine | Admitting: Internal Medicine

## 2021-12-16 ENCOUNTER — Other Ambulatory Visit: Payer: Self-pay | Admitting: Nurse Practitioner

## 2021-12-16 ENCOUNTER — Ambulatory Visit: Payer: 59

## 2021-12-16 ENCOUNTER — Emergency Department (HOSPITAL_COMMUNITY): Payer: 59

## 2021-12-16 ENCOUNTER — Encounter (HOSPITAL_COMMUNITY): Payer: Self-pay | Admitting: Emergency Medicine

## 2021-12-16 ENCOUNTER — Other Ambulatory Visit: Payer: Self-pay

## 2021-12-16 ENCOUNTER — Other Ambulatory Visit: Payer: 59

## 2021-12-16 DIAGNOSIS — Z7901 Long term (current) use of anticoagulants: Secondary | ICD-10-CM

## 2021-12-16 DIAGNOSIS — C787 Secondary malignant neoplasm of liver and intrahepatic bile duct: Secondary | ICD-10-CM | POA: Diagnosis present

## 2021-12-16 DIAGNOSIS — Y92009 Unspecified place in unspecified non-institutional (private) residence as the place of occurrence of the external cause: Secondary | ICD-10-CM | POA: Diagnosis present

## 2021-12-16 DIAGNOSIS — W19XXXA Unspecified fall, initial encounter: Secondary | ICD-10-CM | POA: Diagnosis present

## 2021-12-16 DIAGNOSIS — C259 Malignant neoplasm of pancreas, unspecified: Secondary | ICD-10-CM | POA: Diagnosis present

## 2021-12-16 DIAGNOSIS — K922 Gastrointestinal hemorrhage, unspecified: Secondary | ICD-10-CM | POA: Diagnosis present

## 2021-12-16 DIAGNOSIS — Z794 Long term (current) use of insulin: Secondary | ICD-10-CM

## 2021-12-16 DIAGNOSIS — E785 Hyperlipidemia, unspecified: Secondary | ICD-10-CM

## 2021-12-16 DIAGNOSIS — E1122 Type 2 diabetes mellitus with diabetic chronic kidney disease: Secondary | ICD-10-CM | POA: Diagnosis present

## 2021-12-16 DIAGNOSIS — Z66 Do not resuscitate: Secondary | ICD-10-CM | POA: Diagnosis present

## 2021-12-16 DIAGNOSIS — I442 Atrioventricular block, complete: Secondary | ICD-10-CM | POA: Diagnosis present

## 2021-12-16 DIAGNOSIS — Z6841 Body Mass Index (BMI) 40.0 and over, adult: Secondary | ICD-10-CM

## 2021-12-16 DIAGNOSIS — D6481 Anemia due to antineoplastic chemotherapy: Principal | ICD-10-CM | POA: Diagnosis present

## 2021-12-16 DIAGNOSIS — D631 Anemia in chronic kidney disease: Secondary | ICD-10-CM | POA: Diagnosis present

## 2021-12-16 DIAGNOSIS — I951 Orthostatic hypotension: Secondary | ICD-10-CM | POA: Diagnosis present

## 2021-12-16 DIAGNOSIS — D649 Anemia, unspecified: Secondary | ICD-10-CM

## 2021-12-16 DIAGNOSIS — S0081XA Abrasion of other part of head, initial encounter: Secondary | ICD-10-CM | POA: Diagnosis present

## 2021-12-16 DIAGNOSIS — F419 Anxiety disorder, unspecified: Secondary | ICD-10-CM

## 2021-12-16 DIAGNOSIS — I959 Hypotension, unspecified: Secondary | ICD-10-CM

## 2021-12-16 DIAGNOSIS — E860 Dehydration: Secondary | ICD-10-CM | POA: Diagnosis present

## 2021-12-16 DIAGNOSIS — N1831 Chronic kidney disease, stage 3a: Secondary | ICD-10-CM | POA: Diagnosis present

## 2021-12-16 DIAGNOSIS — D630 Anemia in neoplastic disease: Secondary | ICD-10-CM | POA: Diagnosis present

## 2021-12-16 DIAGNOSIS — Z880 Allergy status to penicillin: Secondary | ICD-10-CM

## 2021-12-16 DIAGNOSIS — D75839 Thrombocytosis, unspecified: Secondary | ICD-10-CM | POA: Diagnosis present

## 2021-12-16 DIAGNOSIS — Z950 Presence of cardiac pacemaker: Secondary | ICD-10-CM

## 2021-12-16 DIAGNOSIS — Z8589 Personal history of malignant neoplasm of other organs and systems: Secondary | ICD-10-CM

## 2021-12-16 DIAGNOSIS — I129 Hypertensive chronic kidney disease with stage 1 through stage 4 chronic kidney disease, or unspecified chronic kidney disease: Secondary | ICD-10-CM | POA: Diagnosis present

## 2021-12-16 DIAGNOSIS — K921 Melena: Secondary | ICD-10-CM | POA: Diagnosis present

## 2021-12-16 DIAGNOSIS — Z20822 Contact with and (suspected) exposure to covid-19: Secondary | ICD-10-CM | POA: Diagnosis present

## 2021-12-16 DIAGNOSIS — W07XXXA Fall from chair, initial encounter: Secondary | ICD-10-CM | POA: Diagnosis present

## 2021-12-16 DIAGNOSIS — Y92019 Unspecified place in single-family (private) house as the place of occurrence of the external cause: Secondary | ICD-10-CM

## 2021-12-16 DIAGNOSIS — R55 Syncope and collapse: Secondary | ICD-10-CM

## 2021-12-16 DIAGNOSIS — G8929 Other chronic pain: Secondary | ICD-10-CM

## 2021-12-16 DIAGNOSIS — Z8542 Personal history of malignant neoplasm of other parts of uterus: Secondary | ICD-10-CM

## 2021-12-16 DIAGNOSIS — I480 Paroxysmal atrial fibrillation: Secondary | ICD-10-CM | POA: Diagnosis present

## 2021-12-16 DIAGNOSIS — T451X5A Adverse effect of antineoplastic and immunosuppressive drugs, initial encounter: Secondary | ICD-10-CM | POA: Diagnosis present

## 2021-12-16 LAB — CBC WITH DIFFERENTIAL/PLATELET
Abs Immature Granulocytes: 0.06 10*3/uL (ref 0.00–0.07)
Basophils Absolute: 0 10*3/uL (ref 0.0–0.1)
Basophils Relative: 1 %
Eosinophils Absolute: 0.1 10*3/uL (ref 0.0–0.5)
Eosinophils Relative: 1 %
HCT: 19.4 % — ABNORMAL LOW (ref 36.0–46.0)
Hemoglobin: 5.9 g/dL — CL (ref 12.0–15.0)
Immature Granulocytes: 1 %
Lymphocytes Relative: 27 %
Lymphs Abs: 1.8 10*3/uL (ref 0.7–4.0)
MCH: 27.6 pg (ref 26.0–34.0)
MCHC: 30.4 g/dL (ref 30.0–36.0)
MCV: 90.7 fL (ref 80.0–100.0)
Monocytes Absolute: 0.3 10*3/uL (ref 0.1–1.0)
Monocytes Relative: 4 %
Neutro Abs: 4.3 10*3/uL (ref 1.7–7.7)
Neutrophils Relative %: 66 %
Platelets: 650 10*3/uL — ABNORMAL HIGH (ref 150–400)
RBC: 2.14 MIL/uL — ABNORMAL LOW (ref 3.87–5.11)
RDW: 21.5 % — ABNORMAL HIGH (ref 11.5–15.5)
WBC: 6.5 10*3/uL (ref 4.0–10.5)
nRBC: 0.5 % — ABNORMAL HIGH (ref 0.0–0.2)

## 2021-12-16 LAB — LACTIC ACID, PLASMA
Lactic Acid, Venous: 4.3 mmol/L (ref 0.5–1.9)
Lactic Acid, Venous: 2.4 mmol/L (ref 0.5–1.9)

## 2021-12-16 LAB — COMPREHENSIVE METABOLIC PANEL
ALT: 16 U/L (ref 0–44)
AST: 36 U/L (ref 15–41)
Albumin: 1.8 g/dL — ABNORMAL LOW (ref 3.5–5.0)
Alkaline Phosphatase: 57 U/L (ref 38–126)
Anion gap: 7 (ref 5–15)
BUN: 9 mg/dL (ref 6–20)
CO2: 24 mmol/L (ref 22–32)
Calcium: 7.3 mg/dL — ABNORMAL LOW (ref 8.9–10.3)
Chloride: 108 mmol/L (ref 98–111)
Creatinine, Ser: 1.25 mg/dL — ABNORMAL HIGH (ref 0.44–1.00)
GFR, Estimated: 50 mL/min — ABNORMAL LOW (ref >60)
Glucose, Bld: 187 mg/dL — ABNORMAL HIGH (ref 70–99)
Potassium: 3.4 mmol/L — ABNORMAL LOW (ref 3.5–5.1)
Sodium: 139 mmol/L (ref 135–145)
Total Bilirubin: 0.5 mg/dL (ref 0.3–1.2)
Total Protein: 6 g/dL — ABNORMAL LOW (ref 6.5–8.1)

## 2021-12-16 LAB — RESP PANEL BY RT-PCR (FLU A&B, COVID) ARPGX2
Influenza A by PCR: NEGATIVE
Influenza B by PCR: NEGATIVE
SARS Coronavirus 2 by RT PCR: NEGATIVE

## 2021-12-16 LAB — PREPARE RBC (CROSSMATCH)

## 2021-12-16 LAB — LIPASE, BLOOD: Lipase: 23 U/L (ref 11–51)

## 2021-12-16 LAB — MAGNESIUM: Magnesium: 1.7 mg/dL (ref 1.7–2.4)

## 2021-12-16 MED ORDER — MUSCLE RUB 10-15 % EX CREA: TOPICAL_CREAM | CUTANEOUS | Status: DC | PRN

## 2021-12-16 MED ORDER — SODIUM CHLORIDE 0.9 % IV SOLN
10.0000 mL/h | Freq: Once | INTRAVENOUS | Status: AC
  Administered 2021-12-16: 10 mL/h via INTRAVENOUS

## 2021-12-16 MED ORDER — ACETAMINOPHEN 325 MG PO TABS: 650.0000 mg | ORAL_TABLET | Freq: Four times a day (QID) | ORAL | Status: DC | PRN

## 2021-12-16 MED ORDER — GABAPENTIN 300 MG PO CAPS
300.0000 mg | ORAL_CAPSULE | Freq: Two times a day (BID) | ORAL | Status: DC
  Administered 2021-12-16 – 2021-12-20 (×8): 300 mg via ORAL
  Filled 2021-12-16 (×8): qty 1

## 2021-12-16 MED ORDER — ADULT MULTIVITAMIN W/MINERALS CH
1.0000 | ORAL_TABLET | Freq: Every day | ORAL | Status: DC
  Administered 2021-12-16 – 2021-12-20 (×5): 1 via ORAL
  Filled 2021-12-16 (×5): qty 1

## 2021-12-16 MED ORDER — ONDANSETRON HCL 4 MG/2ML IJ SOLN
4.0000 mg | Freq: Four times a day (QID) | INTRAMUSCULAR | Status: DC | PRN
  Administered 2021-12-19 – 2021-12-20 (×2): 4 mg via INTRAVENOUS
  Filled 2021-12-16 (×2): qty 2

## 2021-12-16 MED ORDER — LACTATED RINGERS IV BOLUS
1000.0000 mL | Freq: Once | INTRAVENOUS | Status: AC
  Administered 2021-12-16: 1000 mL via INTRAVENOUS

## 2021-12-16 MED ORDER — POLYETHYLENE GLYCOL 3350 17 G PO PACK: 17.0000 g | PACK | Freq: Every day | ORAL | Status: DC | PRN

## 2021-12-16 MED ORDER — LORAZEPAM 0.5 MG PO TABS: 0.5000 mg | ORAL_TABLET | Freq: Two times a day (BID) | ORAL | Status: DC | PRN

## 2021-12-16 MED ORDER — MIRTAZAPINE 15 MG PO TABS
15.0000 mg | ORAL_TABLET | Freq: Every day | ORAL | Status: DC
  Administered 2021-12-16 – 2021-12-19 (×4): 15 mg via ORAL
  Filled 2021-12-16 (×4): qty 1

## 2021-12-16 MED ORDER — TROLAMINE SALICYLATE 10 % EX CREA: 1.0000 "application " | TOPICAL_CREAM | Freq: Two times a day (BID) | CUTANEOUS | Status: DC | PRN

## 2021-12-16 MED ORDER — ACETAMINOPHEN 650 MG RE SUPP: 650.0000 mg | Freq: Four times a day (QID) | RECTAL | Status: DC | PRN

## 2021-12-16 MED ORDER — BACITRACIN-NEOMYCIN-POLYMYXIN OINTMENT TUBE
TOPICAL_OINTMENT | CUTANEOUS | Status: DC | PRN
  Filled 2021-12-16 (×2): qty 14.17

## 2021-12-16 MED ORDER — OXYCODONE HCL 5 MG PO TABS
10.0000 mg | ORAL_TABLET | Freq: Four times a day (QID) | ORAL | Status: DC | PRN
  Administered 2021-12-16 – 2021-12-20 (×8): 10 mg via ORAL
  Filled 2021-12-16 (×9): qty 2

## 2021-12-16 MED ORDER — LORATADINE 10 MG PO TABS: 10.0000 mg | ORAL_TABLET | Freq: Every day | ORAL | Status: DC | PRN

## 2021-12-16 MED ORDER — PRAVASTATIN SODIUM 40 MG PO TABS
40.0000 mg | ORAL_TABLET | Freq: Every day | ORAL | Status: DC
  Administered 2021-12-16 – 2021-12-19 (×4): 40 mg via ORAL
  Filled 2021-12-16 (×5): qty 1

## 2021-12-16 MED ORDER — INSULIN DEGLUDEC 200 UNIT/ML ~~LOC~~ SOPN: PEN_INJECTOR | Freq: Every evening | SUBCUTANEOUS | Status: DC

## 2021-12-16 MED ORDER — PANTOPRAZOLE SODIUM 40 MG PO TBEC
40.0000 mg | DELAYED_RELEASE_TABLET | Freq: Every day | ORAL | Status: DC
  Administered 2021-12-16 – 2021-12-20 (×5): 40 mg via ORAL
  Filled 2021-12-16 (×5): qty 1

## 2021-12-16 MED ORDER — MAGIC MOUTHWASH W/LIDOCAINE
5.0000 mL | Freq: Four times a day (QID) | ORAL | Status: DC
  Administered 2021-12-16 – 2021-12-18 (×3): 5 mL via ORAL
  Filled 2021-12-16 (×16): qty 5

## 2021-12-16 MED ORDER — ONDANSETRON HCL 4 MG PO TABS
4.0000 mg | ORAL_TABLET | Freq: Four times a day (QID) | ORAL | Status: DC | PRN
  Administered 2021-12-16 – 2021-12-19 (×4): 4 mg via ORAL
  Filled 2021-12-16 (×5): qty 1

## 2021-12-16 MED ORDER — HYDRALAZINE HCL 25 MG PO TABS: 25.0000 mg | ORAL_TABLET | Freq: Four times a day (QID) | ORAL | Status: DC | PRN

## 2021-12-16 MED ORDER — INSULIN GLARGINE-YFGN 100 UNIT/ML ~~LOC~~ SOLN
20.0000 [IU] | Freq: Every day | SUBCUTANEOUS | Status: DC
  Administered 2021-12-16 – 2021-12-19 (×4): 20 [IU] via SUBCUTANEOUS
  Filled 2021-12-16 (×5): qty 0.2

## 2021-12-16 NOTE — Assessment & Plan Note (Addendum)
-  Continue home pravastatin  

## 2021-12-16 NOTE — Progress Notes (Signed)
Leslie Mullen   DOB:[birth year]   HB#:[id number]   YBO#:[id number]  Oncology follow up note   Subjective: Patient is well-known to me, under my care for her metastatic pancreatic cancer, currently on chemotherapy.  Presented to ED today with worsening fatigue, syncope, and was found to be hypotensive and anemic with hemoglobin 5.9, no overt bleeding. She has been feeling quite fatigued, with low appetite, intermittent mild nausea since chemo last week.  No fever or chills.   Objective:  Vitals:   12/16/21 1838 12/16/21 1902  BP: 94/61 114/64  Pulse: 98 90  Resp: 14 16  Temp: (!) 97.4 F (36.3 C) 98.2 F (36.8 C)  SpO2: 100% 100%    Body mass index is 47.14 kg/m.  Intake/Output Summary (Last 24 hours) at 12/16/2021 2151 Last data filed at 12/16/2021 2145 Gross per 24 hour  Intake 1687 ml  Output --  Net 1687 ml     Sclerae unicteric  Oropharynx clear  No peripheral adenopathy  Lungs clear -- no rales or rhonchi  Heart regular rate and rhythm  Abdomen benign  MSK no focal spinal tenderness, no peripheral edema  Neuro nonfocal   CBG (last 3)  No results for input(s): GLUCAP in the last 72 hours.   Labs:  Urine Studies No results for input(s): UHGB, CRYS in the last 72 hours.  Invalid input(s): UACOL, UAPR, USPG, UPH, UTP, UGL, UKET, UBIL, UNIT, UROB, ULEU, UEPI, UWBC, URBC, UBAC, CAST, UCOM, [place]  Basic Metabolic Panel: Recent Labs  Lab 12/11/21 1155 12/16/21 1230 12/16/21 1630  NA 142 139  --   K 4.2 3.4*  --   CL 109 108  --   CO2 26 24  --   GLUCOSE 195* 187*  --   BUN 14 9  --   CREATININE 1.07* 1.25*  --   CALCIUM 8.3* 7.3*  --   MG  --   --  1.7   GFR Estimated Creatinine Clearance: 72.4 mL/min (A) (by C-G formula based on SCr of 1.25 mg/dL (H)). Liver Function Tests: Recent Labs  Lab 12/11/21 1155 12/16/21 1230  AST 13* 36  ALT 7 16  ALKPHOS 76 57  BILITOT 0.3 0.5  PROT 6.9 6.0*  ALBUMIN 2.4* 1.8*   Recent Labs  Lab 12/16/21 1230  LIPASE  23   No results for input(s): AMMONIA in the last 168 hours. Coagulation profile No results for input(s): INR, PROTIME in the last 168 hours.  CBC: Recent Labs  Lab 12/11/21 1155 12/16/21 1230  WBC 9.1 6.5  NEUTROABS 4.9 4.3  HGB 8.1* 5.9*  HCT 26.4* 19.4*  MCV 88.0 90.7  PLT 795* 650*   Cardiac Enzymes: No results for input(s): CKTOTAL, CKMB, CKMBINDEX, TROPONINI in the last 168 hours. BNP: Invalid input(s): POCBNP CBG: No results for input(s): GLUCAP in the last 168 hours. D-Dimer No results for input(s): DDIMER in the last 72 hours. Hgb A1c No results for input(s): HGBA1C in the last 72 hours. Lipid Profile No results for input(s): CHOL, HDL, LDLCALC, TRIG, CHOLHDL, LDLDIRECT in the last 72 hours. Thyroid function studies No results for input(s): TSH, T4TOTAL, T3FREE, THYROIDAB in the last 72 hours.  Invalid input(s): FREET3 Anemia work up No results for input(s): VITAMINB12, FOLATE, FERRITIN, TIBC, IRON, RETICCTPCT in the last 72 hours. Microbiology Recent Results (from the past 240 hour(s))  Resp Panel by RT-PCR (Flu A&B, Covid) Nasopharyngeal Swab     Status: None   Collection Time: 12/16/21  3:20 PM  Specimen: Nasopharyngeal Swab; Nasopharyngeal(NP) swabs in vial transport medium  Result Value Ref Range Status   SARS Coronavirus 2 by RT PCR NEGATIVE NEGATIVE Final    Comment: (NOTE) SARS-CoV-2 target nucleic acids are NOT DETECTED.  The SARS-CoV-2 RNA is generally detectable in upper respiratory specimens during the acute phase of infection. The lowest concentration of SARS-CoV-2 viral copies this assay can detect is 138 copies/mL. A negative result does not preclude SARS-Cov-2 infection and should not be used as the sole basis for treatment or other patient management decisions. A negative result may occur with  improper specimen collection/handling, submission of specimen other than nasopharyngeal swab, presence of viral mutation(s) within the areas  targeted by this assay, and inadequate number of viral copies(<138 copies/mL). A negative result must be combined with clinical observations, patient history, and epidemiological information. The expected result is Negative.  Fact Sheet for Patients:  EntrepreneurPulse.com.au  Fact Sheet for Healthcare Providers:  IncredibleEmployment.be  This test is no t yet approved or cleared by the [place] FDA and  has been authorized for detection and/or diagnosis of SARS-CoV-2 by FDA under an Emergency Use Authorization (EUA). This EUA will remain  in effect (meaning this test can be used) for the duration of the COVID-19 declaration under Section 564(b)(1) of the Act, 21 U.S.C.section 360bbb-3(b)(1), unless the authorization is terminated  or revoked sooner.       Influenza A by PCR NEGATIVE NEGATIVE Final   Influenza B by PCR NEGATIVE NEGATIVE Final    Comment: (NOTE) The Xpert Xpress SARS-CoV-2/FLU/RSV plus assay is intended as an aid in the diagnosis of influenza from Nasopharyngeal swab specimens and should not be used as a sole basis for treatment. Nasal washings and aspirates are unacceptable for Xpert Xpress SARS-CoV-2/FLU/RSV testing.  Fact Sheet for Patients: EntrepreneurPulse.com.au  Fact Sheet for Healthcare Providers: IncredibleEmployment.be  This test is not yet approved or cleared by the [place] FDA and has been authorized for detection and/or diagnosis of SARS-CoV-2 by FDA under an Emergency Use Authorization (EUA). This EUA will remain in effect (meaning this test can be used) for the duration of the COVID-19 declaration under Section 564(b)(1) of the Act, 21 U.S.C. section 360bbb-3(b)(1), unless the authorization is terminated or revoked.  Performed at [facility], [place] [address], [place], [place] [address]       Studies:  CT Head Wo Contrast  Result  Date: 12/16/2021 CLINICAL DATA:  Head trauma, moderate to severe. Fall with head injury on anticoagulation. EXAM: CT HEAD WITHOUT CONTRAST TECHNIQUE: Contiguous axial images were obtained from the base of the skull through the vertex without intravenous contrast. COMPARISON:  None. FINDINGS: Brain: The brain shows a normal appearance without evidence of malformation, atrophy, old or acute small or large vessel infarction, mass lesion, hemorrhage, hydrocephalus or extra-axial collection. Vascular: There is atherosclerotic calcification of the major vessels at the base of the brain. Skull: Normal.  No traumatic finding.  No focal bone lesion. Sinuses/Orbits: Sinuses are clear. Orbits appear normal. Mastoids are clear. Other: None significant IMPRESSION: No acute or traumatic finding. Normal study with exception of atherosclerotic calcification of the major vessels at the base of brain. Electronically Signed   By: Nelson Chimes M.D.   On: 12/16/2021 13:19    Assessment: 61 y.o. female   Symptomatic anemia, secondary to chemotherapy and cancer Syncope and hypotensive, secondary to anemia and dehydration CKD stage III  Pancreatic cancer with liver metastasis, on chemotherapy PAF DM Obesity  Plan:  -I agree with blood transfusion to keep Hg>8.0,likely needs 2u blood  -continue IVF and supportive care -I will cancel her appointments with [place] tomorrow  -will see her back on 1/23 as scheduled, will probably reduce her chemo dose due to severe cytopenias   Truitt Merle, MD 12/16/2021

## 2021-12-16 NOTE — H&P (Signed)
History and Physical    Leslie Mullen IPJ:[id number] DOB: [birth year] DOA: 12/16/2021  PCP: Sue Lush, PA-C Patient coming from: Home  Chief Complaint: Fall  HPI: Leslie Mullen is a 61 y.o. female with medical history significant of complete heart block status post Medtronic PPM, diabetes mellitus type 2, endometrial cancer, morbid obesity, hypertension, metastatic pancreatic cancer. This morning when getting out of bed, she had some dizziness after standing. The next thing she remembers is being face down on the floor. She is concerned she blacked out. After regaining consciousness, she stayed on the floor for 20 minutes secondary to inability to get back up, but was her normal self and not confused. EMS was called and they were able to lift her up from the floor. She reports not taking any medications this morning.  ED Course: Vitals: Temperature of 97.7 F, pulse of 108 bpm, respirations of 17 breaths/min, blood pressure 114/67, SPO2 of 99% on room air Labs: Potassium of 3.4, creatinine of 1.25, calcium of 7.3 (corrected to about 8.9), albumin of 1.8, lactic acid of 4.3> 2.4, hemoglobin of 5.9, platelets of 650 Imaging: CT head with no acute/traumatic finding Medications/Course: 1 L lactated ringer bolus, 2 units of packed red blood cells  Review of Systems: Review of Systems  Constitutional:  Positive for chills. Negative for fever.  Respiratory:  Negative for cough and shortness of breath.   Cardiovascular:  Negative for chest pain, palpitations and leg swelling.  Gastrointestinal:  Positive for nausea. Negative for abdominal pain, blood in stool, constipation, diarrhea, melena and vomiting.  Neurological:  Positive for dizziness.  All other systems reviewed and are negative.  Past Medical History:  Diagnosis Date   Abnormal uterine bleeding    Allergy    Anemia    Asthma    Cataract    BOTH EYES   Complete heart block ([place])    a. Medtronic PPM 10/2020.   Diabetes ([place])     type II    Diabetic neuropathy ([place])    Endometrial cancer ([place])    Family history of colon cancer    GERD (gastroesophageal reflux disease)    Hyperlipidemia    Hypertension    Morbid obesity ([place])    Osteoarthritis    right knee    Pancreatitis    Pneumonia     Past Surgical History:  Procedure Laterality Date   CERVICAL BIOPSY     CESAREAN SECTION     x3   COLONOSCOPY WITH PROPOFOL N/A 06/29/2017   Procedure: COLONOSCOPY WITH PROPOFOL;  Surgeon: Irene Shipper, MD;  Location: [facility];  Service: Endoscopy;  Laterality: N/A;   CYSTECTOMY Right 2010   Cyst removed from right groin.    DILATION AND CURETTAGE OF UTERUS N/A 07/29/2021   Procedure: DILATATION AND CURETTAGE OF UTERUS;  Surgeon: Everitt Amber, MD;  Location: [facility];  Service: Gynecology;  Laterality: N/A;   INTRAUTERINE DEVICE (IUD) INSERTION N/A 07/29/2021   Procedure: INTRAUTERINE DEVICE (IUD) INSERTION;  Surgeon: Everitt Amber, MD;  Location: [facility];  Service: Gynecology;  Laterality: N/A;  IUD FROM MAIN PHARMACY   IR IMAGING GUIDED PORT INSERTION  11/11/2021   PACEMAKER IMPLANT N/A 10/11/2020   Procedure: PACEMAKER IMPLANT;  Surgeon: Vickie Epley, MD;  Location: [facility];  Service: Cardiovascular;  Laterality: N/A;   TONSILLECTOMY       reports that she has quit smoking. Her smoking use included cigarettes. She has never used smokeless tobacco. She reports that she does  not drink alcohol and does not use drugs.  Allergies  Allergen Reactions   Penicillins Shortness Of Breath, Itching and Swelling    Has patient had a PCN reaction causing immediate rash, facial/tongue/throat swelling, SOB or lightheadedness with hypotension: Yes Has patient had a PCN reaction causing severe rash involving mucus membranes or skin necrosis:No Has patient had a PCN reaction that required hospitalization: No Has patient had a PCN reaction occurring within the last 10 years: no If all of the above answers are "NO",  then may proceed with Cephalosporin use.      Family History  Adopted: Yes  Problem Relation Age of Onset   Diabetes Father    Heart disease Father    Cancer Mother        colon   Colon cancer Neg Hx    Esophageal cancer Neg Hx    Rectal cancer Neg Hx    Stomach cancer Neg Hx    Prior to Admission medications   Medication Sig Start Date End Date Taking? Authorizing Provider  amLODipine (NORVASC) 10 MG tablet Take 1 tablet (10 mg total) by mouth at bedtime. 01/03/21  Yes [name], MD  apixaban (ELIQUIS) 5 MG TABS tablet Take 1 tablet (5 mg total) by mouth 2 (two) times daily. 09/18/21  Yes Fenton, Clint R, PA  gabapentin (NEURONTIN) 300 MG capsule Take 1 capsule (300 mg total) by mouth 2 (two) times daily. 10/12/20  Yes Evans Lance, MD  insulin degludec (TRESIBA FLEXTOUCH) 200 UNIT/ML FlexTouch Pen Inject 20 Units into the skin every evening. Start at 20 units daily-if you blood glucose levels are still on the higher side-slowly increase back to your usual regimen of 80 units daily. 10/09/21  Yes Ghimire, Henreitta Leber, MD  lidocaine-prilocaine (EMLA) cream Apply to affected area once 10/14/21  Yes Truitt Merle, MD  loratadine (CLARITIN) 10 MG tablet Take 10 mg by mouth daily as needed for allergies.   Yes [provider]  LORazepam (ATIVAN) 0.5 MG tablet Take 1 tablet (0.5 mg total) by mouth 2 (two) times daily as needed (for anxiety, and/or chemo-related nausea/vomiting). 11/13/21  Yes Alla Feeling, NP  magic mouthwash w/lidocaine SOLN Take 5 mLs by mouth 4 (four) times daily. 11/06/21  Yes Alla Feeling, NP  mirtazapine (REMERON) 15 MG tablet Take 1 tablet (15 mg total) by mouth at bedtime. 12/11/21  Yes Truitt Merle, MD  Multiple Vitamin (MULTIVITAMIN) capsule Take 1 capsule by mouth daily.   Yes [provider]  omeprazole (PRILOSEC) 40 MG capsule Take 40 mg by mouth 2 (two) times daily. 09/05/21  Yes [provider]  ondansetron (ZOFRAN) 8 MG tablet Take 1  tablet (8 mg total) by mouth 2 (two) times daily as needed (Nausea or vomiting). 11/13/21  Yes Alla Feeling, NP  Oxycodone HCl 10 MG TABS Take 1 tablet (10 mg total) by mouth every 6 (six) hours as needed. Patient taking differently: Take 10 mg by mouth every 6 (six) hours as needed (pain). 12/11/21  Yes Truitt Merle, MD  polyethylene glycol (MIRALAX / GLYCOLAX) 17 g packet Take 17 g by mouth daily as needed for moderate constipation.   Yes [provider]  pravastatin (PRAVACHOL) 40 MG tablet TAKE 1 TABLET(40 MG) BY MOUTH DAILY 07/23/20  Yes [name], MD  prochlorperazine (COMPAZINE) 10 MG tablet TAKE 1 TABLET(10 MG) BY MOUTH EVERY 6 HOURS AS NEEDED FOR NAUSEA OR VOMITING 12/16/21  Yes Alla Feeling, NP  [facility]  XR 12.04-999 MG TB24 Take 1 tablet by mouth 2 (two) times daily. 05/30/21  Yes [provider]  trolamine salicylate (ASPERCREME) 10 % cream Apply 1 application topically 2 (two) times daily as needed for muscle pain.   Yes [provider]  glucose blood test strip  07/24/15   [provider]  Insulin Pen Needle 31G X 5 MM MISC  08/01/15   [provider]  Jonetta Speak LANCETS [address] [place]  08/01/15   [provider]    Physical Exam:  Physical Exam Constitutional:      General: She is not in acute distress.    Appearance: She is not diaphoretic.  Eyes:     Conjunctiva/sclera: Conjunctivae normal.     Pupils: Pupils are equal, round, and reactive to light.  Cardiovascular:     Rate and Rhythm: Normal rate and regular rhythm.     Heart sounds: Normal heart sounds. No murmur heard. Pulmonary:     Effort: Pulmonary effort is normal. No respiratory distress.     Breath sounds: Normal breath sounds. No wheezing or rales.  Abdominal:     General: Bowel sounds are normal. There is no distension.     Palpations: Abdomen is soft.     Tenderness: There is no abdominal tenderness. There is no guarding or rebound.  Musculoskeletal:         General: No tenderness. Normal range of motion.     Cervical back: Normal range of motion.  Lymphadenopathy:     Cervical: No cervical adenopathy.  Skin:    General: Skin is warm and dry.  Neurological:     Mental Status: She is alert and oriented to person, place, and time. Mental status is at baseline.     Cranial Nerves: Cranial nerves 2-12 are intact.     Sensory: Sensation is intact.     Motor: Weakness and tremor (Mild) present.    Labs on Admission: I have personally reviewed following labs and imaging studies  CBC: Recent Labs  Lab 12/11/21 1155 12/16/21 1230  WBC 9.1 6.5  NEUTROABS 4.9 PENDING  HGB 8.1* 5.9*  HCT 26.4* 19.4*  MCV 88.0 90.7  PLT 795* 650*    Basic Metabolic Panel: Recent Labs  Lab 12/11/21 1155 12/16/21 1230  NA 142 139  K 4.2 3.4*  CL 109 108  CO2 26 24  GLUCOSE 195* 187*  BUN 14 9  CREATININE 1.07* 1.25*  CALCIUM 8.3* 7.3*    GFR: Estimated Creatinine Clearance: 72.4 mL/min (A) (by C-G formula based on SCr of 1.25 mg/dL (H)).  Liver Function Tests: Recent Labs  Lab 12/11/21 1155 12/16/21 1230  AST 13* 36  ALT 7 16  ALKPHOS 76 57  BILITOT 0.3 0.5  PROT 6.9 6.0*  ALBUMIN 2.4* 1.8*   Recent Labs  Lab 12/16/21 1230  LIPASE 23   No results for input(s): AMMONIA in the last 168 hours.  Coagulation Profile: No results for input(s): INR, PROTIME in the last 168 hours.  Cardiac Enzymes: No results for input(s): CKTOTAL, CKMB, CKMBINDEX, TROPONINI in the last 168 hours.  BNP (last 3 results) No results for input(s): PROBNP in the last 8760 hours.  HbA1C: No results for input(s): HGBA1C in the last 72 hours.  CBG: No results for input(s): GLUCAP in the last 168 hours.  Lipid Profile: No results for input(s): CHOL, HDL, LDLCALC, TRIG, CHOLHDL, LDLDIRECT in the last 72 hours.  Thyroid Function Tests: No results for input(s): TSH, T4TOTAL, FREET4,  T3FREE, THYROIDAB in the last 72 hours.  Anemia Panel: No  results for input(s): VITAMINB12, FOLATE, FERRITIN, TIBC, IRON, RETICCTPCT in the last 72 hours.  Urine analysis:    Component Value Date/Time   COLORURINE YELLOW 10/25/2021 0502   APPEARANCEUR CLEAR 10/25/2021 0502   LABSPEC 1.041 (H) 10/25/2021 0502   PHURINE 6.0 10/25/2021 0502   GLUCOSEU >=500 (A) 10/25/2021 0502   HGBUR MODERATE (A) 10/25/2021 0502   BILIRUBINUR NEGATIVE 10/25/2021 0502   KETONESUR 5 (A) 10/25/2021 0502   PROTEINUR 100 (A) 10/25/2021 0502   NITRITE NEGATIVE 10/25/2021 0502   LEUKOCYTESUR NEGATIVE 10/25/2021 0502     Radiological Exams on Admission: CT Head Wo Contrast  Result Date: 12/16/2021 CLINICAL DATA:  Head trauma, moderate to severe. Fall with head injury on anticoagulation. EXAM: CT HEAD WITHOUT CONTRAST TECHNIQUE: Contiguous axial images were obtained from the base of the skull through the vertex without intravenous contrast. COMPARISON:  None. FINDINGS: Brain: The brain shows a normal appearance without evidence of malformation, atrophy, old or acute small or large vessel infarction, mass lesion, hemorrhage, hydrocephalus or extra-axial collection. Vascular: There is atherosclerotic calcification of the major vessels at the base of the brain. Skull: Normal.  No traumatic finding.  No focal bone lesion. Sinuses/Orbits: Sinuses are clear. Orbits appear normal. Mastoids are clear. Other: None significant IMPRESSION: No acute or traumatic finding. Normal study with exception of atherosclerotic calcification of the major vessels at the base of brain. Electronically Signed   By: Nelson Chimes M.D.   On: 12/16/2021 13:19    EKG: Independently reviewed.  Sinus tachycardia.  Assessment/Plan  * Symptomatic anemia- (present on admission) Unknown etiology but in setting of known metastatic pancreatic cancer, chronic anemia and CKD. Complicated by Eliquis use for Afib stroke prevention. Colonoscopy in 2018 was significant for 4 polyps (removed) and diverticulosis.  Patient reports recent colonoscopy in September of 2022 which was significant for 3 non-cancerous polyps. Patient with no history concerning for recent GI bleeding. Normal bilirubin. 2 units of PRBC ordered in the ED -Post-transfusion H&H, CBC in AM -SCDs -FOBT -PT/OT eval  Syncope- (present on admission) Likely related to anemia with resultant hypotension/orthostatic hypotension. Patient does report thinking she fell face first onto the ground. EKG unremarkable. Negative troponin. -Interrogate pacemaker -Telemetry overnight -Orthostatic vitals -Transthoracic Echocardiogram  Complete heart block (HCC) S/p PPM  Chronic pain -Continue home oxycodone 10 mg q6 hours prn  Anxiety -Continue home lorazepam  Hyperlipidemia -Continue home pravastatin  Obesity, Class III, BMI 40-49.9 (morbid obesity) (HCC) Body mass index is 47.14 kg/m.  Stage 3a chronic kidney disease (CKD) (HCC) Baseline creatinine of about 1.3-1.4. Currently below baseline.  Pancreatic cancer metastasized to liver [facility])- (present on admission) Recent diagnosis (<6 months). Patient follows with Dr. Burr Medico as an outpatient. Currently on palliative chemotherapy.  Thrombocytosis- (present on admission) Platelets trended down slightly from recent office visit.  Paroxysmal atrial fibrillation ([place])- (present on admission) Patient is on Eliquis as an outpatient. Not on rate control medication. -Hold Eliquis for now; discussed with patient  Type 2 diabetes mellitus with stage 3a chronic kidney disease, with long-term current use of insulin ([place]) Patient is managed on Synjardy XR, Tresiba 20 units. Hemoglobin A1C of 8.6% from 10/22 -Semglee 20 units daily -SSI    DVT prophylaxis: SCDs Code Status: DNR; confirmed at bedside Family Communication: Daughter at bedside Disposition Plan: Discharge home vs SNF pending completion of blood transfusion, stable hemoglobin, hematology/oncology recommendations, PT/OT  recommendations Consults called: Hematology/Oncology per EDP Admission  status: Observation   Cordelia Poche, MD [facility] 12/16/2021, 4:09 PM

## 2021-12-16 NOTE — Assessment & Plan Note (Signed)
Likely related to anemia with resultant hypotension/orthostatic hypotension. Patient does report thinking she fell face first onto the ground. EKG unremarkable. Negative troponin. -Interrogate pacemaker -Telemetry overnight -Orthostatic vitals -Transthoracic Echocardiogram

## 2021-12-16 NOTE — Assessment & Plan Note (Addendum)
Patient is managed on Synjardy XR, Tresiba 20 units. Hemoglobin A1C of 8.6% from 10/22 -Semglee 20 units daily -SSI

## 2021-12-16 NOTE — Assessment & Plan Note (Addendum)
Unknown etiology but in setting of known metastatic pancreatic cancer, chronic anemia and CKD. Complicated by Eliquis use for Afib stroke prevention. Colonoscopy in 2018 was significant for 4 polyps (removed) and diverticulosis. Patient reports recent colonoscopy in September of 2022 which was significant for 3 non-cancerous polyps. Patient with no history concerning for recent GI bleeding. Normal bilirubin. 2 units of PRBC ordered in the ED -Post-transfusion H&H, CBC in AM -SCDs -FOBT -PT/OT eval

## 2021-12-16 NOTE — Assessment & Plan Note (Signed)
Body mass index is 47.14 kg/m.

## 2021-12-16 NOTE — Assessment & Plan Note (Signed)
-  Continue home lorazepam.  

## 2021-12-16 NOTE — ED Notes (Signed)
Interrogation completed

## 2021-12-16 NOTE — ED Provider Notes (Signed)
[facility] EMERGENCY DEPARTMENT Provider Note    CSN: [id number] Arrival date & time: 12/16/21 1156  History Chief Complaint  Patient presents with   Fall   Dizziness    Leslie Mullen is a 61 y.o. female with history of afib on eliquis, pancreatic cancer getting chemotherapy reports she got up out of bed this morning and was too weak to stand. She slid off the bed onto the carpet where she sustained an abrasion to her R face. She thinks she was briefly unconscious. EMS was called and found her supine on the ground and hypotensive. They were unable to start an IV and so she was transported to the ED for evaluation. She denies any pain, fever. She has had some nausea with chemo and decreased appetite. She denies any diarrhea and in fact has been constipated. She was wobbly and weak trying to stand to go the bathroom. She was able to get to a bedside commode but she was unable to urinate or have a bowel movement.    Home Medications Prior to Admission medications   Medication Sig Start Date End Date Taking? Authorizing Provider  amLODipine (NORVASC) 10 MG tablet Take 1 tablet (10 mg total) by mouth at bedtime. 01/03/21  Yes [name], MD  apixaban (ELIQUIS) 5 MG TABS tablet Take 1 tablet (5 mg total) by mouth 2 (two) times daily. 09/18/21  Yes Fenton, Clint R, PA  gabapentin (NEURONTIN) 300 MG capsule Take 1 capsule (300 mg total) by mouth 2 (two) times daily. 10/12/20  Yes Evans Lance, MD  insulin degludec (TRESIBA FLEXTOUCH) 200 UNIT/ML FlexTouch Pen Inject 20 Units into the skin every evening. Start at 20 units daily-if you blood glucose levels are still on the higher side-slowly increase back to your usual regimen of 80 units daily. 10/09/21  Yes Ghimire, Henreitta Leber, MD  lidocaine-prilocaine (EMLA) cream Apply to affected area once 10/14/21  Yes Truitt Merle, MD  loratadine (CLARITIN) 10 MG tablet Take 10 mg by mouth daily as needed for allergies.   Yes [provider]   LORazepam (ATIVAN) 0.5 MG tablet Take 1 tablet (0.5 mg total) by mouth 2 (two) times daily as needed (for anxiety, and/or chemo-related nausea/vomiting). 11/13/21  Yes Alla Feeling, NP  magic mouthwash w/lidocaine SOLN Take 5 mLs by mouth 4 (four) times daily. 11/06/21  Yes Alla Feeling, NP  mirtazapine (REMERON) 15 MG tablet Take 1 tablet (15 mg total) by mouth at bedtime. 12/11/21  Yes Truitt Merle, MD  Multiple Vitamin (MULTIVITAMIN) capsule Take 1 capsule by mouth daily.   Yes [provider]  omeprazole (PRILOSEC) 40 MG capsule Take 40 mg by mouth 2 (two) times daily. 09/05/21  Yes [provider]  ondansetron (ZOFRAN) 8 MG tablet Take 1 tablet (8 mg total) by mouth 2 (two) times daily as needed (Nausea or vomiting). 11/13/21  Yes Alla Feeling, NP  Oxycodone HCl 10 MG TABS Take 1 tablet (10 mg total) by mouth every 6 (six) hours as needed. Patient taking differently: Take 10 mg by mouth every 6 (six) hours as needed (pain). 12/11/21  Yes Truitt Merle, MD  polyethylene glycol (MIRALAX / GLYCOLAX) 17 g packet Take 17 g by mouth daily as needed for moderate constipation.   Yes [provider]  pravastatin (PRAVACHOL) 40 MG tablet TAKE 1 TABLET(40 MG) BY MOUTH DAILY 07/23/20  Yes [name], MD  prochlorperazine (COMPAZINE) 10 MG tablet TAKE 1 TABLET(10 MG) BY MOUTH EVERY  6 HOURS AS NEEDED FOR NAUSEA OR VOMITING 12/16/21  Yes Alla Feeling, NP  SYNJARDY XR 12.04-999 MG TB24 Take 1 tablet by mouth 2 (two) times daily. 05/30/21  Yes [provider]  trolamine salicylate (ASPERCREME) 10 % cream Apply 1 application topically 2 (two) times daily as needed for muscle pain.   Yes [provider]  glucose blood test strip  07/24/15   [provider]  Insulin Pen Needle 31G X 5 MM MISC  08/01/15   [provider]  Jonetta Speak LANCETS [address] [place]  08/01/15   [provider]     Allergies    Penicillins   Review of Systems    Review of Systems Please see HPI for pertinent positives and negatives  Physical Exam BP (!) 101/57    Pulse 95    Temp 97.7 F (36.5 C) (Oral)    Resp 18    Ht 5\' 8"  (1.727 m)    Wt (!) 140.6 kg    SpO2 98%    BMI 47.14 kg/m   Physical Exam Vitals and nursing note reviewed.  Constitutional:      Appearance: Normal appearance. She is obese.  HENT:     Head: Normocephalic.     Comments: Superficial abrasion R periorbital area    Nose: Nose normal.     Mouth/Throat:     Mouth: Mucous membranes are moist.  Eyes:     Extraocular Movements: Extraocular movements intact.     Conjunctiva/sclera: Conjunctivae normal.     Comments: Pale sclera  Cardiovascular:     Rate and Rhythm: Normal rate.  Pulmonary:     Effort: Pulmonary effort is normal.     Breath sounds: Normal breath sounds.     Comments: Port in R upper chest, pacemaker in L upper chest Abdominal:     General: Abdomen is flat.     Palpations: Abdomen is soft.     Tenderness: There is no abdominal tenderness.  Musculoskeletal:        General: No swelling. Normal range of motion.     Cervical back: Neck supple.  Skin:    General: Skin is warm and dry.  Neurological:     General: No focal deficit present.     Mental Status: She is alert.     Cranial Nerves: No cranial nerve deficit.     Sensory: No sensory deficit.     Motor: No weakness.  Psychiatric:        Mood and Affect: Mood normal.    ED Results / Procedures / Treatments   EKG EKG Interpretation  Date/Time:  Tuesday December 16 2021 12:29:15 EST Ventricular Rate:  106 PR Interval:  152 QRS Duration: 79 QT Interval:  335 QTC Calculation: 445 R Axis:   53 Text Interpretation: Sinus tachycardia Low voltage, precordial leads No significant change since last tracing Confirmed by Calvert Cantor [phone]) on 12/16/2021 1:37:56 PM  Procedures .Critical Care Performed by: Truddie Hidden, MD Authorized by: Truddie Hidden, MD   Critical care  provider statement:    Critical care time (minutes):  30   Critical care time was exclusive of:  Separately billable procedures and treating other patients   Critical care was necessary to treat or prevent imminent or life-threatening deterioration of the following conditions:  Circulatory failure   Critical care was time spent personally by me on the following activities:  Development of treatment plan with patient or surrogate, discussions with consultants, evaluation of  patient's response to treatment, examination of patient, ordering and review of laboratory studies, ordering and review of radiographic studies, ordering and performing treatments and interventions, pulse oximetry, re-evaluation of patient's condition and review of old [place] discussed with: admitting provider    Medications Ordered in the ED Medications  0.9 %  sodium chloride infusion (has no administration in time range)  lactated ringers bolus 1,000 mL (0 mLs Intravenous Stopped 12/16/21 1454)    Initial Impression and Plan  Patient with orthostatic dizziness and low BP at home. Not particularly hypotensive here but she is borderline tachycardic. Will check labs, CT head for bleed, begin IVF and reassess.   ED Course   Clinical Course as of 12/16/21 1614  Tue Dec 16, 2021  1346 CMP shows mild increase in Cr from previous. Protein and Albumin are lower. Lipase is neg.  [CS]  1356 Lactic acid is elevated, she is not febrile orally, but will hold off on checking rectally until we know if she is neutropenic or not. No obvious source of infection by history or exam. Will hold off on Abx pending other sepsis criteria or a drop in BP.  [CS]  1436 CBC with normal WBC but low Hgb worse than recent baseline. Will check Type and screen and begin transfusion. Plan admission for transfusion.  [CS]  1513 Spoke with Dr. Teryl Lucy, Hospitalist, who will come evaluate the patient. Repeat lactic acid is pending.  [CS]    Clinical  Course User Index [CS] Truddie Hidden, MD     MDM Rules/Calculators/A&P Medical Decision Making Problems Addressed: Anemia, unspecified type: acute illness or injury with systemic symptoms Hypotension, unspecified hypotension type: acute illness or injury with systemic symptoms Near syncope: acute illness or injury with systemic symptoms  Amount and/or Complexity of Data Reviewed External Data Reviewed: notes. Labs: ordered. Decision-making details documented in ED Course. Radiology: ordered and independent interpretation performed. Decision-making details documented in ED Course. ECG/medicine tests: ordered and independent interpretation performed. Decision-making details documented in ED Course.  Risk Decision regarding hospitalization.    Final Clinical Impression(s) / ED Diagnoses Final diagnoses:  Anemia, unspecified type  Near syncope  Hypotension, unspecified hypotension type    Rx / DC Orders ED Discharge Orders     None        Truddie Hidden, MD 12/16/21 1614

## 2021-12-16 NOTE — Assessment & Plan Note (Signed)
Recent diagnosis (<6 months). Patient follows with Dr. Burr Medico as an outpatient. Currently on palliative chemotherapy.

## 2021-12-16 NOTE — Assessment & Plan Note (Addendum)
Baseline creatinine of about 1.3-1.4. Currently below baseline.

## 2021-12-16 NOTE — Assessment & Plan Note (Signed)
Platelets trended down slightly from recent office visit.

## 2021-12-16 NOTE — ED Notes (Signed)
Transport called for transfer to

## 2021-12-16 NOTE — Assessment & Plan Note (Signed)
S/p PPM 

## 2021-12-16 NOTE — ED Notes (Signed)
[employer] called to give report. Report will be faxed and sent with patient to the floor

## 2021-12-16 NOTE — ED Triage Notes (Signed)
Per [facility] pt coming from home patient reports sliding down side of bed scraping right side of face on carpet. EMS arrived patient was laying supine on the ground. Was found to be hypotensive, unable to obtain working IV to administer fluids. Patient on eliquis.

## 2021-12-16 NOTE — Assessment & Plan Note (Signed)
Pancreatic primary malignancy

## 2021-12-16 NOTE — Assessment & Plan Note (Signed)
-  Continue home oxycodone 10 mg q6 hours prn

## 2021-12-16 NOTE — Assessment & Plan Note (Signed)
Patient is on Eliquis as an outpatient. Not on rate control medication. -Hold Eliquis for now; discussed with patient

## 2021-12-17 ENCOUNTER — Other Ambulatory Visit: Payer: Self-pay | Admitting: Nurse Practitioner

## 2021-12-17 ENCOUNTER — Inpatient Hospital Stay: Payer: 59

## 2021-12-17 ENCOUNTER — Inpatient Hospital Stay: Payer: 59 | Admitting: Dietician

## 2021-12-17 ENCOUNTER — Observation Stay (HOSPITAL_COMMUNITY): Payer: 59

## 2021-12-17 DIAGNOSIS — R55 Syncope and collapse: Secondary | ICD-10-CM

## 2021-12-17 DIAGNOSIS — D649 Anemia, unspecified: Secondary | ICD-10-CM | POA: Diagnosis not present

## 2021-12-17 LAB — OCCULT BLOOD X 1 CARD TO LAB, STOOL: Fecal Occult Bld: POSITIVE — AB

## 2021-12-17 LAB — COMPREHENSIVE METABOLIC PANEL
ALT: 15 U/L (ref 0–44)
AST: 25 U/L (ref 15–41)
Albumin: 1.7 g/dL — ABNORMAL LOW (ref 3.5–5.0)
Alkaline Phosphatase: 50 U/L (ref 38–126)
Anion gap: 6 (ref 5–15)
BUN: 15 mg/dL (ref 6–20)
CO2: 26 mmol/L (ref 22–32)
Calcium: 7.3 mg/dL — ABNORMAL LOW (ref 8.9–10.3)
Chloride: 106 mmol/L (ref 98–111)
Creatinine, Ser: 1.72 mg/dL — ABNORMAL HIGH (ref 0.44–1.00)
GFR, Estimated: 34 mL/min — ABNORMAL LOW (ref >60)
Glucose, Bld: 143 mg/dL — ABNORMAL HIGH (ref 70–99)
Potassium: 4.1 mmol/L (ref 3.5–5.1)
Sodium: 138 mmol/L (ref 135–145)
Total Bilirubin: 0.7 mg/dL (ref 0.3–1.2)
Total Protein: 5.5 g/dL — ABNORMAL LOW (ref 6.5–8.1)

## 2021-12-17 LAB — ECHOCARDIOGRAM COMPLETE
AR max vel: 2.61 cm2
AV Area VTI: 2.99 cm2
AV Area mean vel: 2.68 cm2
AV Mean grad: 4 mmHg
AV Peak grad: 7.4 mmHg
Ao pk vel: 1.36 m/s
Area-P 1/2: 2.87 cm2
Height: 68 in
S' Lateral: 3.4 cm
Weight: 4927.72 oz

## 2021-12-17 LAB — CBC
HCT: 21.1 % — ABNORMAL LOW (ref 36.0–46.0)
Hemoglobin: 6.6 g/dL — CL (ref 12.0–15.0)
MCH: 26.7 pg (ref 26.0–34.0)
MCHC: 31.3 g/dL (ref 30.0–36.0)
MCV: 85.4 fL (ref 80.0–100.0)
Platelets: 341 10*3/uL (ref 150–400)
RBC: 2.47 MIL/uL — ABNORMAL LOW (ref 3.87–5.11)
RDW: 20.2 % — ABNORMAL HIGH (ref 11.5–15.5)
WBC: 3 10*3/uL — ABNORMAL LOW (ref 4.0–10.5)
nRBC: 1.3 % — ABNORMAL HIGH (ref 0.0–0.2)

## 2021-12-17 LAB — HIV ANTIBODY (ROUTINE TESTING W REFLEX): HIV Screen 4th Generation wRfx: NONREACTIVE

## 2021-12-17 LAB — GLUCOSE, CAPILLARY: Glucose-Capillary: 160 mg/dL — ABNORMAL HIGH (ref 70–99)

## 2021-12-17 LAB — HEMOGLOBIN AND HEMATOCRIT, BLOOD
HCT: 21.9 % — ABNORMAL LOW (ref 36.0–46.0)
Hemoglobin: 7.2 g/dL — ABNORMAL LOW (ref 12.0–15.0)

## 2021-12-17 LAB — PREPARE RBC (CROSSMATCH)

## 2021-12-17 MED ORDER — SODIUM CHLORIDE 0.9% FLUSH
10.0000 mL | Freq: Two times a day (BID) | INTRAVENOUS | Status: DC
  Administered 2021-12-18 – 2021-12-20 (×3): 10 mL

## 2021-12-17 MED ORDER — CHLORHEXIDINE GLUCONATE CLOTH 2 % EX PADS
6.0000 | MEDICATED_PAD | Freq: Every day | CUTANEOUS | Status: DC
  Administered 2021-12-17 – 2021-12-20 (×4): 6 via TOPICAL

## 2021-12-17 MED ORDER — SODIUM CHLORIDE 0.9% IV SOLUTION
Freq: Once | INTRAVENOUS | Status: AC
  Administered 2021-12-17: 10 mL/h via INTRAVENOUS

## 2021-12-17 NOTE — Progress Notes (Signed)
Pt had a large bowel movement after reported constipation for several days. Small amount of bright red blood noted in bowel movement. Pt denies hemorrhoids, states "it might be from all the straining." OB card sent to lab per order.  Coolidge Breeze, RN 12/17/2021

## 2021-12-17 NOTE — Progress Notes (Signed)
OT Cancellation Note  Patient Details Name: Leslie Mullen MRN: [id number] DOB: 08-23-1961   Cancelled Treatment:    Reason Eval/Treat Not Completed: Medical issues which prohibited therapy Patient noted to have continued low Hgb with noted transfusion this AM.nursing notes noted to have small amount of blood in stool.  OT will hold at this time and continue to check back as schedule will allow.   Jackelyn Poling OTR/L, [facility] Office# [phone] Pager# [phone]   12/17/2021, 1:36 PM

## 2021-12-17 NOTE — Progress Notes (Signed)
Echocardiogram 2D Echocardiogram has been performed.  Arlyss Gandy 12/17/2021, 8:42 AM

## 2021-12-17 NOTE — Progress Notes (Signed)
PT Cancellation Note  Patient Details Name: Leslie Mullen MRN: [id number] DOB: 09-30-1961   Cancelled Treatment:    Reason Eval/Treat Not Completed: Medical issues which prohibited therapy: low hgb-plan for transfusion. Will hold PT for now and will check back as schedule allows.    [facility]  Office: [phone] Pager: [phone]

## 2021-12-17 NOTE — Progress Notes (Signed)
PROGRESS NOTE    Leslie Mullen  MIW:[id number] DOB: December 04, 1961 DOA: 12/16/2021 PCP: Sue Lush, PA-C   Brief Narrative:  Leslie Mullen is a 61 y.o. female with medical history significant of complete heart block status post [employer] PPM, diabetes mellitus type 2, endometrial cancer, morbid obesity, hypertension, metastatic pancreatic cancer. This morning when getting out of bed, she had some dizziness after standing. The next thing she remembers is being face down on the floor. She is concerned she blacked out. After regaining consciousness, she stayed on the floor for 20 minutes secondary to inability to get back up, but was her normal self and not confused. EMS was called and they were able to lift her up from the floor. She reports not taking any medications this morning.  Assessment & Plan:   Symptomatic anemia- multifactorial -Likely chemotherapy/metastatic pancreatic cancer induced with poor production -Cannot bright red blood per rectum, FOBT positive -continue to follow clinically, hold off on GI consult given scant blood in a single event - On Eliquis for A. Fib -continue to hold and monitor closely - Recent colonoscopy in September of 2022 which was significant for 3 non-cancerous polyps.  - 3u PRBC given overnight - still transfusing this morning- repeat labs pending   Syncope versus near syncope, secondary to above -Follow-up posttransfusion, orthostatic vitals pending -Telemetry ongoing -Echocardiogram pending   Complete heart block ([place]) -Status post pacemaker implantation   Chronic pain -Continue home oxycodone 10 mg q6 hours prn   Anxiety -Continue home lorazepam   Hyperlipidemia -Continue home pravastatin   Obesity, Class III, BMI 40-49.9 (morbid obesity) ([place]) Body mass index is 47.14 kg/m.   Stage 3a chronic kidney disease (CKD) (HCC) Baseline creatinine of about 1.3-1.4. Currently below baseline.   Pancreatic cancer metastasized to liver [facility])-  (present on admission) Recent diagnosis (<6 months). Patient follows with Dr. Burr Medico as an outpatient. Currently on palliative chemotherapy.   Thrombocytosis- (present on admission) Platelets trended down slightly from recent office visit.   Paroxysmal atrial fibrillation ([place])- (present on admission) Patient is on Eliquis as an outpatient. Not on rate control medication. -Hold Eliquis for now; discussed with patient   Type 2 diabetes mellitus with stage 3a chronic kidney disease, with long-term current use of insulin ([place]) Patient is managed on Synjardy XR, Tresiba 20 units. Hemoglobin A1C of 8.6% from 10/22 -Semglee 20 units daily -SSI     DVT prophylaxis: SCDs Code Status: DNR; confirmed at bedside Family Communication: At bedside  Status is: Inpatient  Dispo: The patient is from: Home              Anticipated d/c is to: Home              Anticipated d/c date is: 40 to 72 hours pending clinical course              Patient currently not medically stable for discharge  Consultants:  Oncology  Procedures:  None  Antimicrobials:  None  Subjective: No acute issues or events overnight denies nausea vomiting diarrhea constipation headache fevers chills or chest pain  Objective: Vitals:   12/16/21 2240 12/17/21 0045 12/17/21 0515 12/17/21 0530  BP: 129/69 140/78 (!) 145/72 124/71  Pulse: 83 80 83 81  Resp: 15 16 15 14   Temp: 98.2 F (36.8 C) 98.1 F (36.7 C) 97.9 F (36.6 C) 98.1 F (36.7 C)  TempSrc: Oral Oral Oral Oral  SpO2: 100% 100% 100% 100%  Weight:      Height:  Intake/Output Summary (Last 24 hours) at 12/17/2021 0727 Last data filed at 12/17/2021 0445 Gross per 24 hour  Intake 2673.66 ml  Output --  Net 2673.66 ml   Filed Weights   12/16/21 1212 12/16/21 1902  Weight: (!) 140.6 kg (!) 139.7 kg    Examination:  General exam: Appears calm and comfortable  Respiratory system: Clear to auscultation. Respiratory effort normal. Cardiovascular  system: S1 & S2 heard, RRR. No JVD, murmurs, rubs, gallops or clicks. No pedal edema. Gastrointestinal system: Abdomen is nondistended, soft and nontender. No organomegaly or masses felt. Normal bowel sounds heard. Central nervous system: Alert and oriented. No focal neurological deficits. Extremities: Symmetric 5 x 5 power. Skin: No rashes, lesions or ulcers Psychiatry: Judgement and insight appear normal. Mood & affect appropriate.   Data Reviewed: I have personally reviewed following labs and imaging studies  CBC: Recent Labs  Lab 12/11/21 1155 12/16/21 1230 12/17/21 0340  WBC 9.1 6.5 3.0*  NEUTROABS 4.9 4.3  --   HGB 8.1* 5.9* 6.6*  HCT 26.4* 19.4* 21.1*  MCV 88.0 90.7 85.4  PLT 795* 650* 250   Basic Metabolic Panel: Recent Labs  Lab 12/11/21 1155 12/16/21 1230 12/16/21 1630 12/17/21 0340  NA 142 139  --  138  K 4.2 3.4*  --  4.1  CL 109 108  --  106  CO2 26 24  --  26  GLUCOSE 195* 187*  --  143*  BUN 14 9  --  15  CREATININE 1.07* 1.25*  --  1.72*  CALCIUM 8.3* 7.3*  --  7.3*  MG  --   --  1.7  --    GFR: Estimated Creatinine Clearance: 52.4 mL/min (A) (by C-G formula based on SCr of 1.72 mg/dL (H)). Liver Function Tests: Recent Labs  Lab 12/11/21 1155 12/16/21 1230 12/17/21 0340  AST 13* 36 25  ALT 7 16 15   ALKPHOS 76 57 50  BILITOT 0.3 0.5 0.7  PROT 6.9 6.0* 5.5*  ALBUMIN 2.4* 1.8* 1.7*   Recent Labs  Lab 12/16/21 1230  LIPASE 23   No results for input(s): AMMONIA in the last 168 hours. Coagulation Profile: No results for input(s): INR, PROTIME in the last 168 hours. Cardiac Enzymes: No results for input(s): CKTOTAL, CKMB, CKMBINDEX, TROPONINI in the last 168 hours. BNP (last 3 results) No results for input(s): PROBNP in the last 8760 hours. HbA1C: No results for input(s): HGBA1C in the last 72 hours. CBG: No results for input(s): GLUCAP in the last 168 hours. Lipid Profile: No results for input(s): CHOL, HDL, LDLCALC, TRIG, CHOLHDL,  LDLDIRECT in the last 72 hours. Thyroid Function Tests: No results for input(s): TSH, T4TOTAL, FREET4, T3FREE, THYROIDAB in the last 72 hours. Anemia Panel: No results for input(s): VITAMINB12, FOLATE, FERRITIN, TIBC, IRON, RETICCTPCT in the last 72 hours. Sepsis Labs: Recent Labs  Lab 12/16/21 1230 12/16/21 1455  LATICACIDVEN 4.3* 2.4*    Recent Results (from the past 240 hour(s))  Resp Panel by RT-PCR (Flu A&B, Covid) Nasopharyngeal Swab     Status: None   Collection Time: 12/16/21  3:20 PM   Specimen: Nasopharyngeal Swab; Nasopharyngeal(NP) swabs in vial transport medium  Result Value Ref Range Status   SARS Coronavirus 2 by RT PCR NEGATIVE NEGATIVE Final    Comment: (NOTE) SARS-CoV-2 target nucleic acids are NOT DETECTED.  The SARS-CoV-2 RNA is generally detectable in upper respiratory specimens during the acute phase of infection. The lowest concentration of SARS-CoV-2 viral copies this assay can  detect is 138 copies/mL. A negative result does not preclude SARS-Cov-2 infection and should not be used as the sole basis for treatment or other patient management decisions. A negative result may occur with  improper specimen collection/handling, submission of specimen other than nasopharyngeal swab, presence of viral mutation(s) within the areas targeted by this assay, and inadequate number of viral copies(<138 copies/mL). A negative result must be combined with clinical observations, patient history, and epidemiological information. The expected result is Negative.  Fact Sheet for Patients:  EntrepreneurPulse.com.au  Fact Sheet for Healthcare Providers:  IncredibleEmployment.be  This test is no t yet approved or cleared by the [place] FDA and  has been authorized for detection and/or diagnosis of SARS-CoV-2 by FDA under an Emergency Use Authorization (EUA). This EUA will remain  in effect (meaning this test can be used) for the  duration of the COVID-19 declaration under Section 564(b)(1) of the Act, 21 U.S.C.section 360bbb-3(b)(1), unless the authorization is terminated  or revoked sooner.       Influenza A by PCR NEGATIVE NEGATIVE Final   Influenza B by PCR NEGATIVE NEGATIVE Final    Comment: (NOTE) The Xpert Xpress SARS-CoV-2/FLU/RSV plus assay is intended as an aid in the diagnosis of influenza from Nasopharyngeal swab specimens and should not be used as a sole basis for treatment. Nasal washings and aspirates are unacceptable for Xpert Xpress SARS-CoV-2/FLU/RSV testing.  Fact Sheet for Patients: EntrepreneurPulse.com.au  Fact Sheet for Healthcare Providers: IncredibleEmployment.be  This test is not yet approved or cleared by the [place] FDA and has been authorized for detection and/or diagnosis of SARS-CoV-2 by FDA under an Emergency Use Authorization (EUA). This EUA will remain in effect (meaning this test can be used) for the duration of the COVID-19 declaration under Section 564(b)(1) of the Act, 21 U.S.C. section 360bbb-3(b)(1), unless the authorization is terminated or revoked.  Performed at [facility], [place] [address], [place], [place] [address]          Radiology Studies: CT Head Wo Contrast  Result Date: 12/16/2021 CLINICAL DATA:  Head trauma, moderate to severe. Fall with head injury on anticoagulation. EXAM: CT HEAD WITHOUT CONTRAST TECHNIQUE: Contiguous axial images were obtained from the base of the skull through the vertex without intravenous contrast. COMPARISON:  None. FINDINGS: Brain: The brain shows a normal appearance without evidence of malformation, atrophy, old or acute small or large vessel infarction, mass lesion, hemorrhage, hydrocephalus or extra-axial collection. Vascular: There is atherosclerotic calcification of the major vessels at the base of the brain. Skull: Normal.  No traumatic finding.  No focal bone  lesion. Sinuses/Orbits: Sinuses are clear. Orbits appear normal. Mastoids are clear. Other: None significant IMPRESSION: No acute or traumatic finding. Normal study with exception of atherosclerotic calcification of the major vessels at the base of brain. Electronically Signed   By: Nelson Chimes M.D.   On: 12/16/2021 13:19        Scheduled Meds:  Chlorhexidine Gluconate Cloth  6 each Topical Daily   gabapentin  300 mg Oral BID   insulin glargine-yfgn  20 Units Subcutaneous QHS   magic mouthwash w/lidocaine  5 mL Oral QID   mirtazapine  15 mg Oral QHS   multivitamin with minerals  1 tablet Oral Daily   pantoprazole  40 mg Oral Daily   pravastatin  40 mg Oral Daily   sodium chloride flush  10-40 mL Intracatheter Q12H   Continuous Infusions:   LOS: 0 days   Time spent: 2min  Little Ishikawa, DO [facility]  If 7PM-7AM, please contact night-coverage [email or web address]  12/17/2021, 7:27 AM

## 2021-12-18 DIAGNOSIS — Z880 Allergy status to penicillin: Secondary | ICD-10-CM | POA: Diagnosis not present

## 2021-12-18 DIAGNOSIS — I951 Orthostatic hypotension: Secondary | ICD-10-CM | POA: Diagnosis present

## 2021-12-18 DIAGNOSIS — C787 Secondary malignant neoplasm of liver and intrahepatic bile duct: Secondary | ICD-10-CM | POA: Diagnosis present

## 2021-12-18 DIAGNOSIS — Z794 Long term (current) use of insulin: Secondary | ICD-10-CM | POA: Diagnosis not present

## 2021-12-18 DIAGNOSIS — I442 Atrioventricular block, complete: Secondary | ICD-10-CM | POA: Diagnosis present

## 2021-12-18 DIAGNOSIS — C259 Malignant neoplasm of pancreas, unspecified: Secondary | ICD-10-CM | POA: Diagnosis present

## 2021-12-18 DIAGNOSIS — Z66 Do not resuscitate: Secondary | ICD-10-CM | POA: Diagnosis present

## 2021-12-18 DIAGNOSIS — E1122 Type 2 diabetes mellitus with diabetic chronic kidney disease: Secondary | ICD-10-CM | POA: Diagnosis present

## 2021-12-18 DIAGNOSIS — W07XXXA Fall from chair, initial encounter: Secondary | ICD-10-CM | POA: Diagnosis present

## 2021-12-18 DIAGNOSIS — R55 Syncope and collapse: Secondary | ICD-10-CM | POA: Diagnosis present

## 2021-12-18 DIAGNOSIS — S0081XA Abrasion of other part of head, initial encounter: Secondary | ICD-10-CM | POA: Diagnosis present

## 2021-12-18 DIAGNOSIS — I129 Hypertensive chronic kidney disease with stage 1 through stage 4 chronic kidney disease, or unspecified chronic kidney disease: Secondary | ICD-10-CM | POA: Diagnosis present

## 2021-12-18 DIAGNOSIS — D649 Anemia, unspecified: Secondary | ICD-10-CM | POA: Diagnosis not present

## 2021-12-18 DIAGNOSIS — N1831 Chronic kidney disease, stage 3a: Secondary | ICD-10-CM | POA: Diagnosis present

## 2021-12-18 DIAGNOSIS — Y92019 Unspecified place in single-family (private) house as the place of occurrence of the external cause: Secondary | ICD-10-CM | POA: Diagnosis not present

## 2021-12-18 DIAGNOSIS — Z6841 Body Mass Index (BMI) 40.0 and over, adult: Secondary | ICD-10-CM | POA: Diagnosis not present

## 2021-12-18 DIAGNOSIS — E860 Dehydration: Secondary | ICD-10-CM | POA: Diagnosis present

## 2021-12-18 DIAGNOSIS — Z7901 Long term (current) use of anticoagulants: Secondary | ICD-10-CM | POA: Diagnosis not present

## 2021-12-18 DIAGNOSIS — Z950 Presence of cardiac pacemaker: Secondary | ICD-10-CM | POA: Diagnosis not present

## 2021-12-18 DIAGNOSIS — G8929 Other chronic pain: Secondary | ICD-10-CM | POA: Diagnosis present

## 2021-12-18 DIAGNOSIS — D631 Anemia in chronic kidney disease: Secondary | ICD-10-CM | POA: Diagnosis present

## 2021-12-18 DIAGNOSIS — Z20822 Contact with and (suspected) exposure to covid-19: Secondary | ICD-10-CM | POA: Diagnosis present

## 2021-12-18 DIAGNOSIS — D630 Anemia in neoplastic disease: Secondary | ICD-10-CM | POA: Diagnosis present

## 2021-12-18 DIAGNOSIS — K922 Gastrointestinal hemorrhage, unspecified: Secondary | ICD-10-CM | POA: Diagnosis present

## 2021-12-18 DIAGNOSIS — K921 Melena: Secondary | ICD-10-CM | POA: Diagnosis present

## 2021-12-18 DIAGNOSIS — I480 Paroxysmal atrial fibrillation: Secondary | ICD-10-CM | POA: Diagnosis present

## 2021-12-18 DIAGNOSIS — D75839 Thrombocytosis, unspecified: Secondary | ICD-10-CM | POA: Diagnosis present

## 2021-12-18 DIAGNOSIS — D6481 Anemia due to antineoplastic chemotherapy: Secondary | ICD-10-CM | POA: Diagnosis present

## 2021-12-18 LAB — BPAM RBC
Blood Product Expiration Date: 202301302359
Blood Product Expiration Date: 202301302359
Blood Product Expiration Date: 202301302359
ISSUE DATE / TIME: 202301101840
ISSUE DATE / TIME: 202301102218
ISSUE DATE / TIME: 202301110511
Unit Type and Rh: 7300
Unit Type and Rh: 7300
Unit Type and Rh: 7300

## 2021-12-18 LAB — TYPE AND SCREEN
ABO/RH(D): B POS
Antibody Screen: NEGATIVE
Unit division: 0
Unit division: 0
Unit division: 0

## 2021-12-18 LAB — BASIC METABOLIC PANEL
Anion gap: 7 (ref 5–15)
BUN: 28 mg/dL — ABNORMAL HIGH (ref 6–20)
CO2: 27 mmol/L (ref 22–32)
Calcium: 7.7 mg/dL — ABNORMAL LOW (ref 8.9–10.3)
Chloride: 107 mmol/L (ref 98–111)
Creatinine, Ser: 1.3 mg/dL — ABNORMAL HIGH (ref 0.44–1.00)
GFR, Estimated: 47 mL/min — ABNORMAL LOW (ref >60)
Glucose, Bld: 97 mg/dL (ref 70–99)
Potassium: 4 mmol/L (ref 3.5–5.1)
Sodium: 141 mmol/L (ref 135–145)

## 2021-12-18 LAB — GLUCOSE, CAPILLARY: Glucose-Capillary: 200 mg/dL — ABNORMAL HIGH (ref 70–99)

## 2021-12-18 LAB — CBC
HCT: 22.9 % — ABNORMAL LOW (ref 36.0–46.0)
Hemoglobin: 7.3 g/dL — ABNORMAL LOW (ref 12.0–15.0)
MCH: 27.2 pg (ref 26.0–34.0)
MCHC: 31.9 g/dL (ref 30.0–36.0)
MCV: 85.4 fL (ref 80.0–100.0)
Platelets: 259 10*3/uL (ref 150–400)
RBC: 2.68 MIL/uL — ABNORMAL LOW (ref 3.87–5.11)
RDW: 19.1 % — ABNORMAL HIGH (ref 11.5–15.5)
WBC: 6.4 10*3/uL (ref 4.0–10.5)
nRBC: 1.3 % — ABNORMAL HIGH (ref 0.0–0.2)

## 2021-12-18 MED ORDER — LIP MEDEX EX OINT
TOPICAL_OINTMENT | CUTANEOUS | Status: DC | PRN
  Filled 2021-12-18: qty 7

## 2021-12-18 NOTE — Consult Note (Signed)
[facility] Consultation Note  Referring Provider: [facility] Primary Care Physician:  Sue Lush, PA-C Primary Gastroenterologist:  Dr. Lucienne Capers, MD  Reason for Consultation:  Hematochezia  HPI: Leslie Mullen is a 61 y.o. female with hematochezia.  History of metastatic (to liver) pancreatic adenocarcinoma.  Patient has had a couple episodes of hematochezia.  Is getting chemotherapy with Dr. Burr Medico.  Last colonoscopy few months ago by Dr. Lucienne Capers, MD, which among other findings showed sigmoid diverticulosis.  Some mild abdominal soreness.   Past Medical History:  Diagnosis Date   Abnormal uterine bleeding    Allergy    Anemia    Asthma    Cataract    BOTH EYES   Complete heart block ([place])    a. [employer] PPM 10/2020.   Diabetes ([place])    type II    Diabetic neuropathy ([place])    Endometrial cancer ([place])    Family history of colon cancer    GERD (gastroesophageal reflux disease)    Hyperlipidemia    Hypertension    Morbid obesity ([place])    Osteoarthritis    right knee    Pancreatitis    Pneumonia     Past Surgical History:  Procedure Laterality Date   CERVICAL BIOPSY     CESAREAN SECTION     x3   COLONOSCOPY WITH PROPOFOL N/A 06/29/2017   Procedure: COLONOSCOPY WITH PROPOFOL;  Surgeon: Irene Shipper, MD;  Location: [facility];  Service: Endoscopy;  Laterality: N/A;   CYSTECTOMY Right 2010   Cyst removed from right groin.    DILATION AND CURETTAGE OF UTERUS N/A 07/29/2021   Procedure: DILATATION AND CURETTAGE OF UTERUS;  Surgeon: Everitt Amber, MD;  Location: [facility];  Service: Gynecology;  Laterality: N/A;   INTRAUTERINE DEVICE (IUD) INSERTION N/A 07/29/2021   Procedure: INTRAUTERINE DEVICE (IUD) INSERTION;  Surgeon: Everitt Amber, MD;  Location: [facility];  Service: Gynecology;  Laterality: N/A;  IUD FROM MAIN PHARMACY   IR IMAGING GUIDED PORT INSERTION  11/11/2021   PACEMAKER IMPLANT N/A 10/11/2020   Procedure: PACEMAKER IMPLANT;  Surgeon:  Vickie Epley, MD;  Location: [facility];  Service: Cardiovascular;  Laterality: N/A;   TONSILLECTOMY      Prior to Admission medications   Medication Sig Start Date End Date Taking? Authorizing Provider  amLODipine (NORVASC) 10 MG tablet Take 1 tablet (10 mg total) by mouth at bedtime. 01/03/21  Yes [name], MD  apixaban (ELIQUIS) 5 MG TABS tablet Take 1 tablet (5 mg total) by mouth 2 (two) times daily. 09/18/21  Yes Fenton, Clint R, PA  gabapentin (NEURONTIN) 300 MG capsule Take 1 capsule (300 mg total) by mouth 2 (two) times daily. 10/12/20  Yes Evans Lance, MD  insulin degludec (TRESIBA FLEXTOUCH) 200 UNIT/ML FlexTouch Pen Inject 20 Units into the skin every evening. Start at 20 units daily-if you blood glucose levels are still on the higher side-slowly increase back to your usual regimen of 80 units daily. 10/09/21  Yes Ghimire, Henreitta Leber, MD  lidocaine-prilocaine (EMLA) cream Apply to affected area once 10/14/21  Yes Truitt Merle, MD  loratadine (CLARITIN) 10 MG tablet Take 10 mg by mouth daily as needed for allergies.   Yes [provider]  LORazepam (ATIVAN) 0.5 MG tablet Take 1 tablet (0.5 mg total) by mouth 2 (two) times daily as needed (for anxiety, and/or chemo-related nausea/vomiting). 11/13/21  Yes Alla Feeling, NP  magic mouthwash w/lidocaine SOLN Take 5 mLs by  mouth 4 (four) times daily. 11/06/21  Yes Alla Feeling, NP  mirtazapine (REMERON) 15 MG tablet Take 1 tablet (15 mg total) by mouth at bedtime. 12/11/21  Yes Truitt Merle, MD  Multiple Vitamin (MULTIVITAMIN) capsule Take 1 capsule by mouth daily.   Yes [provider]  omeprazole (PRILOSEC) 40 MG capsule Take 40 mg by mouth 2 (two) times daily. 09/05/21  Yes [provider]  ondansetron (ZOFRAN) 8 MG tablet Take 1 tablet (8 mg total) by mouth 2 (two) times daily as needed (Nausea or vomiting). 11/13/21  Yes Alla Feeling, NP  Oxycodone HCl 10 MG TABS Take 1 tablet (10 mg total) by  mouth every 6 (six) hours as needed. Patient taking differently: Take 10 mg by mouth every 6 (six) hours as needed (pain). 12/11/21  Yes Truitt Merle, MD  polyethylene glycol (MIRALAX / GLYCOLAX) 17 g packet Take 17 g by mouth daily as needed for moderate constipation.   Yes [provider]  pravastatin (PRAVACHOL) 40 MG tablet TAKE 1 TABLET(40 MG) BY MOUTH DAILY 07/23/20  Yes [name], MD  prochlorperazine (COMPAZINE) 10 MG tablet TAKE 1 TABLET(10 MG) BY MOUTH EVERY 6 HOURS AS NEEDED FOR NAUSEA OR VOMITING 12/16/21  Yes Alla Feeling, NP  SYNJARDY XR 12.04-999 MG TB24 Take 1 tablet by mouth 2 (two) times daily. 05/30/21  Yes [provider]  trolamine salicylate (ASPERCREME) 10 % cream Apply 1 application topically 2 (two) times daily as needed for muscle pain.   Yes [provider]  glucose blood test strip  07/24/15   [provider]  Insulin Pen Needle 31G X 5 MM MISC  08/01/15   [provider]  Jonetta Speak LANCETS 40J [place]  08/01/15   [provider]    Current Facility-Administered Medications  Medication Dose Route Frequency Provider Last Rate Last Admin   acetaminophen (TYLENOL) tablet 650 mg  650 mg Oral Q6H PRN Mariel Aloe, MD       Or   acetaminophen (TYLENOL) suppository 650 mg  650 mg Rectal Q6H PRN Mariel Aloe, MD       Chlorhexidine Gluconate Cloth 2 % PADS 6 each  6 each Topical Daily Mariel Aloe, MD   6 each at 12/18/21 0850   gabapentin (NEURONTIN) capsule 300 mg  300 mg Oral BID Mariel Aloe, MD   300 mg at 12/18/21 0847   hydrALAZINE (APRESOLINE) tablet 25 mg  25 mg Oral Q6H PRN Mariel Aloe, MD       insulin glargine-yfgn (SEMGLEE) injection 20 Units  20 Units Subcutaneous QHS Mariel Aloe, MD   20 Units at 12/17/21 2036   lip balm (CARMEX) ointment   Topical PRN Little Ishikawa, MD   Given at 12/18/21 1130   loratadine (CLARITIN) tablet 10 mg  10 mg Oral Daily PRN Mariel Aloe, MD        LORazepam (ATIVAN) tablet 0.5 mg  0.5 mg Oral BID PRN Mariel Aloe, MD       magic mouthwash w/lidocaine  5 mL Oral QID Mariel Aloe, MD   5 mL at 12/18/21 0847   mirtazapine (REMERON) tablet 15 mg  15 mg Oral QHS Mariel Aloe, MD   15 mg at 12/17/21 2035   multivitamin with minerals tablet 1 tablet  1 tablet Oral Daily Mariel Aloe, MD   1 tablet at 12/18/21 0847   Muscle Rub CREA   Topical  PRN Mariel Aloe, MD       neomycin-bacitracin-polymyxin (NEOSPORIN) ointment   Topical PRN Kathryne Eriksson, NP       ondansetron [facility]) tablet 4 mg  4 mg Oral Q6H PRN Mariel Aloe, MD   4 mg at 12/18/21 0973   Or   ondansetron (ZOFRAN) injection 4 mg  4 mg Intravenous Q6H PRN Mariel Aloe, MD       oxyCODONE (Oxy IR/ROXICODONE) immediate release tablet 10 mg  10 mg Oral Q6H PRN Mariel Aloe, MD   10 mg at 12/18/21 0858   pantoprazole (PROTONIX) EC tablet 40 mg  40 mg Oral Daily Mariel Aloe, MD   40 mg at 12/18/21 0847   polyethylene glycol (MIRALAX / GLYCOLAX) packet 17 g  17 g Oral Daily PRN Mariel Aloe, MD       pravastatin (PRAVACHOL) tablet 40 mg  40 mg Oral Daily Mariel Aloe, MD   40 mg at 12/17/21 2036   sodium chloride flush (NS) 0.9 % injection 10-40 mL  10-40 mL Intracatheter Q12H Mariel Aloe, MD   10 mL at 12/18/21 1131    Allergies as of 12/16/2021 - Review Complete 12/16/2021  Allergen Reaction Noted   Penicillins Shortness Of Breath, Itching, and Swelling 04/06/2016    Family History  Adopted: Yes  Problem Relation Age of Onset   Diabetes Father    Heart disease Father    Cancer Mother        colon   Colon cancer Neg Hx    Esophageal cancer Neg Hx    Rectal cancer Neg Hx    Stomach cancer Neg Hx     Social History   Socioeconomic History   Marital status: Married    Spouse name: Not on file   Number of children: 2   Years of education: Not on file   Highest education level: Not on file  Occupational History   Occupation: Intake  Coordinator  Tobacco Use   Smoking status: Former    Types: Cigarettes   Smokeless tobacco: Never   Tobacco comments:    Former smoker (09/18/2021)  Vaping Use   Vaping Use: Never used  Substance and Sexual Activity   Alcohol use: No    Alcohol/week: 0.0 standard drinks   Drug use: No   Sexual activity: Not on file  Other Topics Concern   Not on file  Social History Narrative   Not on file   Social Determinants of Health   Financial Resource Strain: Not on file  Food Insecurity: Not on file  Transportation Needs: Not on file  Physical Activity: Not on file  Stress: Not on file  Social Connections: Not on file  Intimate Partner Violence: Not on file    Review of Systems: As per HPI, all others negative  Physical Exam: Vital signs in last 24 hours: Temp:  [98.7 F (37.1 C)-99.2 F (37.3 C)] 99 F (37.2 C) (01/12 1332) Pulse Rate:  [78-89] 89 (01/12 1332) Resp:  [10-18] 18 (01/12 1332) BP: (112-147)/(59-74) 112/59 (01/12 1332) SpO2:  [97 %-99 %] 99 % (01/12 1332) Last BM Date: 12/17/21 General:   Alert,  overweight, older-appearing than stated age, NAD Head:  Normocephalic and atraumatic. Eyes:  Sclera clear, no icterus.   Conjunctiva pink. Ears:  Normal auditory acuity. Nose:  No deformity, discharge,  or lesions. Mouth:  No deformity or lesions.  Oropharynx pink & moist. Neck:  Supple; no masses or thyromegaly.  Abdomen:  Soft, protuberant, nontender and nondistended. No masses, hepatosplenomegaly or hernias noted. Normal bowel sounds, without guarding, and without rebound.     Msk:  Symmetrical without gross deformities. Normal posture. Pulses:  Normal pulses noted. Extremities:  Without clubbing or edema. Neurologic:  Alert and  oriented x4;  grossly normal neurologically. Skin:  Intact without significant lesions or rashes. Psych:  Alert and cooperative. Normal mood and affect.   Lab Results: Recent Labs    12/16/21 1230 12/17/21 0340 12/17/21 1758  12/18/21 0349  WBC 6.5 3.0*  --  6.4  HGB 5.9* 6.6* 7.2* 7.3*  HCT 19.4* 21.1* 21.9* 22.9*  PLT 650* 341  --  259   BMET Recent Labs    12/16/21 1230 12/17/21 0340 12/18/21 0349  NA 139 138 141  K 3.4* 4.1 4.0  CL 108 106 107  CO2 24 26 27   GLUCOSE 187* 143* 97  BUN 9 15 28*  CREATININE 1.25* 1.72* 1.30*  CALCIUM 7.3* 7.3* 7.7*   LFT Recent Labs    12/17/21 0340  PROT 5.5*  ALBUMIN 1.7*  AST 25  ALT 15  ALKPHOS 50  BILITOT 0.7   PT/INR No results for input(s): LABPROT, INR in the last 72 hours.  Studies/Results:   Impression:   Metastatic pancreatic adenocarcinoma, in treatment (Dr. Burr Medico).  Anemia, likely multifactorial.  Hematochezia, improving, suspect diverticulosis.  Plan:   Advance diet as tolerated.  Patient's bleeding is improving, she had endoscopy/colonoscopy few months ago, and she has aggressive metastatic cancer.  In light of all of this, I would not pursue any endoscopy/colonoscopy or any further GI work-up or testing at this point.  Should patient have worsening degree of bleeding, would consider CTA as next step, as bleeding into/around her pancreatic necrotic tumor would be leading consideration. Supportive care with serial CBCs, blood transfusion as needed. [facility] will follow.   LOS: 0 days   Einer Meals M  12/18/2021, 2:22 PM  Cell [phone] If no answer or after 5 PM call [phone]

## 2021-12-18 NOTE — Progress Notes (Signed)
OT Cancellation Note  Patient Details Name: Leslie Mullen MRN: [id number] DOB: 30-Nov-1961   Cancelled Treatment:    Reason Eval/Treat Not Completed: OT screened, no needs identified, will sign off. Per PT patient was independent with ADLs and patient reporting no needs.  [name] L [name] 12/18/2021, 2:30 PM

## 2021-12-18 NOTE — Plan of Care (Signed)
Pt alert, had 2 episodes of incontinent BM with red streaks and black in color, large amount. Pt no c/o dizziness, no s/s of respiratory distress and no n/v. Pt lying in bed comfortably in bed and Will continue plan of care.  Problem: Clinical Measurements: Goal: Ability to maintain clinical measurements within normal limits will improve Outcome: Progressing   Problem: Pain Managment: Goal: General experience of comfort will improve Outcome: Progressing

## 2021-12-18 NOTE — Progress Notes (Signed)
OT Cancellation Note  Patient Details Name: Star Resler MRN: [id number] DOB: [birth year]   Cancelled Treatment:    Reason Eval/Treat Not Completed: Patient at procedure or test/ unavailable;Other (comment) (currently working with PT).  Tyrone Schimke, OT Acute Rehabilitation Services Office: [phone]  12/18/2021, 12:52 PM

## 2021-12-18 NOTE — Progress Notes (Signed)
PROGRESS NOTE    Allysa Governale  XVQ:[id number] DOB: February 08, 1961 DOA: 12/16/2021 PCP: Sue Lush, PA-C   Brief Narrative:  Leslie Mullen is a 61 y.o. female with medical history significant of complete heart block status post Medtronic PPM, diabetes mellitus type 2, endometrial cancer, morbid obesity, hypertension, metastatic pancreatic cancer. This morning when getting out of bed, she had some dizziness after standing. The next thing she remembers is being face down on the floor. She is concerned she blacked out. After regaining consciousness, she stayed on the floor for 20 minutes secondary to inability to get back up, but was her normal self and not confused. EMS was called and they were able to lift her up from the floor. She reports not taking any medications this morning.  Assessment & Plan:   Symptomatic anemia- multifactorial - Likely chemotherapy/metastatic pancreatic cancer induced with poor production -Worsening bright red blood per rectum overnight per patient and staff, hemoglobin downtrending - FOBT positive -continue to follow clinically - GI consult given worsening anemia and concern for concurrent GI bleed on top of chemotherapy-induced anemia -Continue to hold Eliquis - Recent colonoscopy in September of 2022 which was significant for 3 non-cancerous polyps; EGD without definitive ulceration - 3u PRBC transfused at intake - hgb slowly trending up.   Syncope versus near syncope, secondary to above - Follow-up posttransfusion, orthostatic vitals pending - Telemetry ongoing - Echocardiogram pending   Complete heart block (HCC) - Status post pacemaker implantation   Chronic pain - Continue home oxycodone 10 mg q6 hours prn   Anxiety - Continue home lorazepam   Hyperlipidemia - Continue home pravastatin   Obesity, Class III, BMI 40-49.9 (morbid obesity) ([place]) - Body mass index is 47.14 kg/m.   Stage 3a chronic kidney disease (CKD) (HCC) Baseline creatinine of  about 1.3-1.4. Currently below baseline.   Pancreatic cancer metastasized to liver [facility])- (present on admission) Recent diagnosis (<6 months). Patient follows with Dr. Burr Medico as an outpatient. Currently on palliative chemotherapy.   Thrombocytosis- (present on admission) Platelets trended down slightly from recent office visit.   Paroxysmal atrial fibrillation ([place])- (present on admission) Patient is on Eliquis as an outpatient. Not on rate control medication. -Hold Eliquis for now; discussed with patient   Type 2 diabetes mellitus with stage 3a chronic kidney disease, with long-term current use of insulin ([place]) Patient is managed on Synjardy XR, Tresiba 20 units. Hemoglobin A1C of 8.6% from 10/22 -Semglee 20 units daily -SSI     DVT prophylaxis: SCDs Code Status: DNR; confirmed at bedside Family Communication: At bedside  Status is: Inpatient  Dispo: The patient is from: Home              Anticipated d/c is to: Home              Anticipated d/c date is: 40 to 72 hours pending clinical course              Patient currently not medically stable for discharge  Consultants:  Oncology  Procedures:  None  Antimicrobials:  None  Subjective: No acute issues or events overnight denies nausea vomiting diarrhea constipation headache fevers chills or chest pain  Objective: Vitals:   12/17/21 2029 12/18/21 0135 12/18/21 0300 12/18/21 0400  BP: (!) 147/72 130/69 133/74 116/71  Pulse: 86 79 78 81  Resp: 16 16 10 16   Temp: 98.7 F (37.1 C)   99.2 F (37.3 C)  TempSrc: Oral   Oral  SpO2: 97% 99% 99% 99%  Weight:      Height:        Intake/Output Summary (Last 24 hours) at 12/18/2021 0754 Last data filed at 12/17/2021 2037 Gross per 24 hour  Intake 594 ml  Output --  Net 594 ml    Filed Weights   12/16/21 1212 12/16/21 1902  Weight: (!) 140.6 kg (!) 139.7 kg    Examination:  General exam: Appears calm and comfortable  Respiratory system: Clear to auscultation.  Respiratory effort normal. Cardiovascular system: S1 & S2 heard, RRR. No JVD, murmurs, rubs, gallops or clicks. No pedal edema. Gastrointestinal system: Abdomen is nondistended, soft and nontender. No organomegaly or masses felt. Normal bowel sounds heard. Central nervous system: Alert and oriented. No focal neurological deficits. Extremities: Symmetric 5 x 5 power. Skin: No rashes, lesions or ulcers Psychiatry: Judgement and insight appear normal. Mood & affect appropriate.   Data Reviewed: I have personally reviewed following labs and imaging studies  CBC: Recent Labs  Lab 12/11/21 1155 12/16/21 1230 12/17/21 0340 12/17/21 1758 12/18/21 0349  WBC 9.1 6.5 3.0*  --  6.4  NEUTROABS 4.9 4.3  --   --   --   HGB 8.1* 5.9* 6.6* 7.2* 7.3*  HCT 26.4* 19.4* 21.1* 21.9* 22.9*  MCV 88.0 90.7 85.4  --  85.4  PLT 795* 650* 341  --  454    Basic Metabolic Panel: Recent Labs  Lab 12/11/21 1155 12/16/21 1230 12/16/21 1630 12/17/21 0340 12/18/21 0349  NA 142 139  --  138 141  K 4.2 3.4*  --  4.1 4.0  CL 109 108  --  106 107  CO2 26 24  --  26 27  GLUCOSE 195* 187*  --  143* 97  BUN 14 9  --  15 28*  CREATININE 1.07* 1.25*  --  1.72* 1.30*  CALCIUM 8.3* 7.3*  --  7.3* 7.7*  MG  --   --  1.7  --   --     GFR: Estimated Creatinine Clearance: 69.3 mL/min (A) (by C-G formula based on SCr of 1.3 mg/dL (H)). Liver Function Tests: Recent Labs  Lab 12/11/21 1155 12/16/21 1230 12/17/21 0340  AST 13* 36 25  ALT 7 16 15   ALKPHOS 76 57 50  BILITOT 0.3 0.5 0.7  PROT 6.9 6.0* 5.5*  ALBUMIN 2.4* 1.8* 1.7*    Recent Labs  Lab 12/16/21 1230  LIPASE 23    No results for input(s): AMMONIA in the last 168 hours. Coagulation Profile: No results for input(s): INR, PROTIME in the last 168 hours. Cardiac Enzymes: No results for input(s): CKTOTAL, CKMB, CKMBINDEX, TROPONINI in the last 168 hours. BNP (last 3 results) No results for input(s): PROBNP in the last 8760 hours. HbA1C: No  results for input(s): HGBA1C in the last 72 hours. CBG: Recent Labs  Lab 12/17/21 2030  GLUCAP 160*   Lipid Profile: No results for input(s): CHOL, HDL, LDLCALC, TRIG, CHOLHDL, LDLDIRECT in the last 72 hours. Thyroid Function Tests: No results for input(s): TSH, T4TOTAL, FREET4, T3FREE, THYROIDAB in the last 72 hours. Anemia Panel: No results for input(s): VITAMINB12, FOLATE, FERRITIN, TIBC, IRON, RETICCTPCT in the last 72 hours. Sepsis Labs: Recent Labs  Lab 12/16/21 1230 12/16/21 1455  LATICACIDVEN 4.3* 2.4*     Recent Results (from the past 240 hour(s))  Resp Panel by RT-PCR (Flu A&B, Covid) Nasopharyngeal Swab     Status: None   Collection Time: 12/16/21  3:20 PM   Specimen: Nasopharyngeal Swab; Nasopharyngeal(NP) swabs  in vial transport medium  Result Value Ref Range Status   SARS Coronavirus 2 by RT PCR NEGATIVE NEGATIVE Final    Comment: (NOTE) SARS-CoV-2 target nucleic acids are NOT DETECTED.  The SARS-CoV-2 RNA is generally detectable in upper respiratory specimens during the acute phase of infection. The lowest concentration of SARS-CoV-2 viral copies this assay can detect is 138 copies/mL. A negative result does not preclude SARS-Cov-2 infection and should not be used as the sole basis for treatment or other patient management decisions. A negative result may occur with  improper specimen collection/handling, submission of specimen other than nasopharyngeal swab, presence of viral mutation(s) within the areas targeted by this assay, and inadequate number of viral copies(<138 copies/mL). A negative result must be combined with clinical observations, patient history, and epidemiological information. The expected result is Negative.  Fact Sheet for Patients:  EntrepreneurPulse.com.au  Fact Sheet for Healthcare Providers:  IncredibleEmployment.be  This test is no t yet approved or cleared by the [place] FDA and  has  been authorized for detection and/or diagnosis of SARS-CoV-2 by FDA under an Emergency Use Authorization (EUA). This EUA will remain  in effect (meaning this test can be used) for the duration of the COVID-19 declaration under Section 564(b)(1) of the Act, 21 U.S.C.section 360bbb-3(b)(1), unless the authorization is terminated  or revoked sooner.       Influenza A by PCR NEGATIVE NEGATIVE Final   Influenza B by PCR NEGATIVE NEGATIVE Final    Comment: (NOTE) The Xpert Xpress SARS-CoV-2/FLU/RSV plus assay is intended as an aid in the diagnosis of influenza from Nasopharyngeal swab specimens and should not be used as a sole basis for treatment. Nasal washings and aspirates are unacceptable for Xpert Xpress SARS-CoV-2/FLU/RSV testing.  Fact Sheet for Patients: EntrepreneurPulse.com.au  Fact Sheet for Healthcare Providers: IncredibleEmployment.be  This test is not yet approved or cleared by the [place] FDA and has been authorized for detection and/or diagnosis of SARS-CoV-2 by FDA under an Emergency Use Authorization (EUA). This EUA will remain in effect (meaning this test can be used) for the duration of the COVID-19 declaration under Section 564(b)(1) of the Act, 21 U.S.C. section 360bbb-3(b)(1), unless the authorization is terminated or revoked.  Performed at [facility], [place] [address], [place], [place] [address]           Radiology Studies: CT Head Wo Contrast  Result Date: 12/16/2021 CLINICAL DATA:  Head trauma, moderate to severe. Fall with head injury on anticoagulation. EXAM: CT HEAD WITHOUT CONTRAST TECHNIQUE: Contiguous axial images were obtained from the base of the skull through the vertex without intravenous contrast. COMPARISON:  None. FINDINGS: Brain: The brain shows a normal appearance without evidence of malformation, atrophy, old or acute small or large vessel infarction, mass lesion, hemorrhage,  hydrocephalus or extra-axial collection. Vascular: There is atherosclerotic calcification of the major vessels at the base of the brain. Skull: Normal.  No traumatic finding.  No focal bone lesion. Sinuses/Orbits: Sinuses are clear. Orbits appear normal. Mastoids are clear. Other: None significant IMPRESSION: No acute or traumatic finding. Normal study with exception of atherosclerotic calcification of the major vessels at the base of brain. Electronically Signed   By: Nelson Chimes M.D.   On: 12/16/2021 13:19   ECHOCARDIOGRAM COMPLETE  Result Date: 12/17/2021    ECHOCARDIOGRAM REPORT   Patient Name:   CANDICE TOBEY Date of Exam: 12/17/2021 Medical Rec #:  [id number]    Height:       68.0 in  Accession #:    [id number]   Weight:       308.0 lb Date of Birth:  12/16/1960    BSA:          2.455 m Patient Age:    65 years     BP:           124/71 mmHg Patient Gender: F            HR:           81 bpm. Exam Location:  Inpatient Procedure: 2D Echo Indications:     Syncope  History:         Patient has prior history of Echocardiogram examinations, most                  recent 10/10/2020. Risk Factors:Diabetes, Hypertension and                  Dyslipidemia. Complete heart block.  Sonographer:     Arlyss Gandy Referring Phys:  [phone] RALPH A NETTEY Diagnosing Phys: Fransico Him MD IMPRESSIONS  1. Left ventricular ejection fraction, by estimation, is 60 to 65%. The left ventricle has normal function. The left ventricle has no regional wall motion abnormalities. Left ventricular diastolic parameters are consistent with Grade I diastolic dysfunction (impaired relaxation).  2. Right ventricular systolic function is normal. The right ventricular size is normal. There is normal pulmonary artery systolic pressure. The estimated right ventricular systolic pressure is 26.3 mmHg.  3. The mitral valve is normal in structure. No evidence of mitral valve regurgitation. No evidence of mitral stenosis.  4. The aortic valve is tricuspid.  Aortic valve regurgitation is not visualized. Aortic valve sclerosis is present, with no evidence of aortic valve stenosis.  5. The inferior vena cava is dilated in size with <50% respiratory variability, suggesting right atrial pressure of 15 mmHg. FINDINGS  Left Ventricle: Left ventricular ejection fraction, by estimation, is 60 to 65%. The left ventricle has normal function. The left ventricle has no regional wall motion abnormalities. The left ventricular internal cavity size was normal in size. There is  no left ventricular hypertrophy. Left ventricular diastolic parameters are consistent with Grade I diastolic dysfunction (impaired relaxation). Normal left ventricular filling pressure. Right Ventricle: The right ventricular size is normal. No increase in right ventricular wall thickness. Right ventricular systolic function is normal. There is normal pulmonary artery systolic pressure. The tricuspid regurgitant velocity is 2.23 m/s, and  with an assumed right atrial pressure of 15 mmHg, the estimated right ventricular systolic pressure is 78.5 mmHg. Left Atrium: Left atrial size was normal in size. Right Atrium: Right atrial size was normal in size. Pericardium: There is no evidence of pericardial effusion. Mitral Valve: The mitral valve is normal in structure. No evidence of mitral valve regurgitation. No evidence of mitral valve stenosis. Tricuspid Valve: The tricuspid valve is normal in structure. Tricuspid valve regurgitation is trivial. No evidence of tricuspid stenosis. Aortic Valve: The aortic valve is tricuspid. Aortic valve regurgitation is not visualized. Aortic valve sclerosis is present, with no evidence of aortic valve stenosis. Aortic valve mean gradient measures 4.0 mmHg. Aortic valve peak gradient measures 7.4  mmHg. Aortic valve area, by VTI measures 2.99 cm. Pulmonic Valve: The pulmonic valve was normal in structure. Pulmonic valve regurgitation is not visualized. No evidence of pulmonic  stenosis. Aorta: The aortic root is normal in size and structure. Venous: The inferior vena cava is dilated in size with less than 50% respiratory variability,  suggesting right atrial pressure of 15 mmHg. IAS/Shunts: No atrial level shunt detected by color flow Doppler. Additional Comments: A device lead is visualized.  LEFT VENTRICLE PLAX 2D LVIDd:         4.70 cm   Diastology LVIDs:         3.40 cm   LV e' medial:    6.09 cm/s LV PW:         1.00 cm   LV E/e' medial:  13.6 LV IVS:        1.00 cm   LV e' lateral:   6.31 cm/s LVOT diam:     2.30 cm   LV E/e' lateral: 13.2 LV SV:         82 LV SV Index:   34 LVOT Area:     4.15 cm  RIGHT VENTRICLE             IVC RV S prime:     11.30 cm/s  IVC diam: 2.50 cm TAPSE (M-mode): 1.8 cm LEFT ATRIUM             Index LA diam:        3.40 cm 1.38 cm/m LA Vol (A2C):   48.8 ml 19.88 ml/m LA Vol (A4C):   47.7 ml 19.43 ml/m LA Biplane Vol: 48.5 ml 19.75 ml/m  AORTIC VALVE AV Area (Vmax):    2.61 cm AV Area (Vmean):   2.68 cm AV Area (VTI):     2.99 cm AV Vmax:           136.00 cm/s AV Vmean:          95.100 cm/s AV VTI:            0.275 m AV Peak Grad:      7.4 mmHg AV Mean Grad:      4.0 mmHg LVOT Vmax:         85.50 cm/s LVOT Vmean:        61.400 cm/s LVOT VTI:          0.198 m LVOT/AV VTI ratio: 0.72  AORTA Ao Root diam: 3.10 cm Ao Asc diam:  2.90 cm MITRAL VALVE               TRICUSPID VALVE MV Area (PHT): 2.87 cm    TR Peak grad:   19.9 mmHg MV Decel Time: 264 msec    TR Vmax:        223.00 cm/s MV E velocity: 83.10 cm/s MV A velocity: 98.50 cm/s  SHUNTS MV E/A ratio:  0.84        Systemic VTI:  0.20 m                            Systemic Diam: 2.30 cm Fransico Him MD Electronically signed by Fransico Him MD Signature Date/Time: 12/17/2021/9:03:38 AM    Final (Updated)         Scheduled Meds:  Chlorhexidine Gluconate Cloth  6 each Topical Daily   gabapentin  300 mg Oral BID   insulin glargine-yfgn  20 Units Subcutaneous QHS   magic mouthwash w/lidocaine  5  mL Oral QID   mirtazapine  15 mg Oral QHS   multivitamin with minerals  1 tablet Oral Daily   pantoprazole  40 mg Oral Daily   pravastatin  40 mg Oral Daily   sodium chloride flush  10-40 mL Intracatheter Q12H   Continuous Infusions:   LOS: 0  days   Time spent: 68min  Yuvonne Lanahan C Saleen Peden, DO [facility]  If 7PM-7AM, please contact night-coverage [email or web address]  12/18/2021, 7:54 AM

## 2021-12-18 NOTE — Evaluation (Signed)
Physical Therapy Evaluation Only Patient Details Name: Leslie Mullen MRN: [id number] DOB: 03/12/1961 Today's Date: 12/18/2021  History of Present Illness  Leslie Mullen is a 61 y.o. female admitted with symptomatic anemia; post 3 units PRBCs. PMH: complete heart block s/p Medtronic PPM, diabetes, endometrial cancer, morbid obesity, HTN, metastatic pancreatic cancer, afib, CKD   Clinical Impression  Pt from home with spouse, using quad cane in the home and community, reports 2 falls this week, ind and has recently ordered shower seat and hand held shower head to improve ease of bathing. Pt amb around room without difficulty, completes toileting independently, and returns to recliner; pt declines amb in hallway. Pt demonstrates steady gait in room, able to navigate turns and bathroom door with supv for safety. PT denies fatigue with ambulation, does report easily fatigues with community distances; would benefit from rollator for seated rest breaks as needed. Pt with good family support. No acute PT needs identified, will sign off at this time.        Recommendations for follow up therapy are one component of a multi-disciplinary discharge planning process, led by the attending physician.  Recommendations may be updated based on patient status, additional functional criteria and insurance authorization.  Follow Up Recommendations No PT follow up    Assistance Recommended at Discharge Intermittent Supervision/Assistance  Patient can return home with the following  A little help with bathing/dressing/bathroom;Assistance with cooking/housework    Equipment Recommendations Rollator (4 wheels)  Recommendations for Other Services       Functional Status Assessment       Precautions / Restrictions Precautions Precautions: Fall Restrictions Weight Bearing Restrictions: No      Mobility  Bed Mobility Overal bed mobility: Modified Independent  General bed mobility comments: increased time  with use of bedrail    Transfers Overall transfer level: Needs assistance Equipment used: Rolling walker (2 wheels) Transfers: Sit to/from Stand Sit to Stand: Supervision  General transfer comment: powers to stand with BUE assisting to power up, rocking momentum to power up    Ambulation/Gait Ambulation/Gait assistance: Supervision Gait Distance (Feet): 30 Feet Assistive device: Rolling walker (2 wheels) Gait Pattern/deviations: Step-through pattern;Decreased stride length Gait velocity: decreased  General Gait Details: step through pattern amb around room/restroom navigating turns and furniture without LOB, declines amb in hallway, denies dizziness, fatigue or SOB  Stairs            Wheelchair Mobility    Modified Rankin (Stroke Patients Only)       Balance Overall balance assessment: No apparent balance deficits (not formally assessed)       Pertinent Vitals/Pain Pain Assessment: No/denies pain    Home Living Family/patient expects to be discharged to:: Private residence Living Arrangements: Spouse/significant other;Children Available Help at Discharge: Family;Available 24 hours/day Type of Home: Apartment Home Access: Level entry       Home Layout: One level Home Equipment: Cane - quad Additional Comments: pt reports ordered shower seat and hand held shower head, rollator    Prior Function Prior Level of Function : Needs assist  Physical Assist : ADLs (physical)   ADLs (physical): Bathing Mobility Comments: pt reports ind with quad cane for community distances, 2 falls this week and 1 fall in November ADLs Comments: pt reports ind with dressing, cooking, light cleaning; requires some assist with bathing but has ordered shower seat to maintain independence     Hand Dominance        Extremity/Trunk Assessment   Upper Extremity Assessment Upper Extremity  Assessment: Overall WFL for tasks assessed    Lower Extremity Assessment Lower Extremity  Assessment: Overall WFL for tasks assessed    Cervical / Trunk Assessment Cervical / Trunk Assessment: Normal  Communication   Communication: No difficulties  Cognition Arousal/Alertness: Awake/alert Behavior During Therapy: WFL for tasks assessed/performed Overall Cognitive Status: Within Functional Limits for tasks assessed     General Comments      Exercises     Assessment/Plan    PT Assessment Patient does not need any further PT services  PT Problem List Decreased activity tolerance;Decreased balance       PT Treatment Interventions      PT Goals (Current goals can be found in the Care Plan section)  Acute Rehab PT Goals Patient Stated Goal: return home with spouse, no PT PT Goal Formulation: All assessment and education complete, DC therapy    Frequency       Co-evaluation               AM-PAC PT "6 Clicks" Mobility  Outcome Measure Help needed turning from your back to your side while in a flat bed without using bedrails?: None Help needed moving from lying on your back to sitting on the side of a flat bed without using bedrails?: None Help needed moving to and from a bed to a chair (including a wheelchair)?: A Little Help needed standing up from a chair using your arms (e.g., wheelchair or bedside chair)?: A Little Help needed to walk in hospital room?: A Little Help needed climbing 3-5 steps with a railing? : A Little 6 Click Score: 20    End of Session   Activity Tolerance: Patient tolerated treatment well Patient left: in chair;with call bell/phone within reach;with family/visitor present Nurse Communication: Mobility status PT Visit Diagnosis: Other abnormalities of gait and mobility (R26.89)    Time: 5093-2671 PT Time Calculation (min) (ACUTE ONLY): 20 min   Charges:   PT Evaluation $PT Eval Low Complexity: 1 Low           Tori Kissie Ziolkowski PT, DPT 12/18/21, 1:09 PM

## 2021-12-19 DIAGNOSIS — D649 Anemia, unspecified: Secondary | ICD-10-CM | POA: Diagnosis not present

## 2021-12-19 LAB — CBC
HCT: 22.9 % — ABNORMAL LOW (ref 36.0–46.0)
Hemoglobin: 7.3 g/dL — ABNORMAL LOW (ref 12.0–15.0)
MCH: 27.9 pg (ref 26.0–34.0)
MCHC: 31.9 g/dL (ref 30.0–36.0)
MCV: 87.4 fL (ref 80.0–100.0)
Platelets: 217 10*3/uL (ref 150–400)
RBC: 2.62 MIL/uL — ABNORMAL LOW (ref 3.87–5.11)
RDW: 19.3 % — ABNORMAL HIGH (ref 11.5–15.5)
WBC: 6.3 10*3/uL (ref 4.0–10.5)
nRBC: 1.4 % — ABNORMAL HIGH (ref 0.0–0.2)

## 2021-12-19 LAB — BASIC METABOLIC PANEL
Anion gap: 10 (ref 5–15)
BUN: 23 mg/dL — ABNORMAL HIGH (ref 6–20)
CO2: 27 mmol/L (ref 22–32)
Calcium: 7.8 mg/dL — ABNORMAL LOW (ref 8.9–10.3)
Chloride: 103 mmol/L (ref 98–111)
Creatinine, Ser: 1.09 mg/dL — ABNORMAL HIGH (ref 0.44–1.00)
GFR, Estimated: 59 mL/min — ABNORMAL LOW (ref >60)
Glucose, Bld: 124 mg/dL — ABNORMAL HIGH (ref 70–99)
Potassium: 3.7 mmol/L (ref 3.5–5.1)
Sodium: 140 mmol/L (ref 135–145)

## 2021-12-19 LAB — GLUCOSE, CAPILLARY: Glucose-Capillary: 235 mg/dL — ABNORMAL HIGH (ref 70–99)

## 2021-12-19 MED ORDER — SODIUM CHLORIDE 0.9% IV SOLUTION: Freq: Once | INTRAVENOUS | Status: AC

## 2021-12-19 MED ORDER — ACETAMINOPHEN 325 MG PO TABS
650.0000 mg | ORAL_TABLET | Freq: Once | ORAL | Status: AC
  Administered 2021-12-19: 650 mg via ORAL
  Filled 2021-12-19: qty 2

## 2021-12-19 NOTE — Progress Notes (Signed)
DBIV: Notified by phlebotomist that pt has new order for crossmatch to be drawn from port. Noted progress note recommended transfusion in the morning 1/14. VAST will draw TXM with AM labs to avoid multiple connections to port and decrease blood waste with lab draw. Confirmed with blood bank, no indication of difficult crossmatch at this time. Plan discussed with [name], RN.

## 2021-12-19 NOTE — Progress Notes (Signed)
Oncology Discharge Planning Admission Note  [facility] Address: [address] [place], [place], [place] [address] Hours of Operation:  [place] Pol[place], Monday - Friday  Clinic Contact Information:  [phone]  Oncology Care Team: Medical Oncologist:  Dr Octavio Graves, NP has evaluated patient at bedside.  Dr Burr Medico is aware of this hospital admission dated 12/18/21, and the cancer center will follow Leslie Mullen inpatient care to assist with discharge planning as indicated by the oncologist.  We will reach out closer to discharge date to arrange follow up care.  Disclaimer:  This [place] note does not imply a formal consult request has been made by the admitting attending for this admission or there will be an inpatient consult completed by oncology.  Please request oncology consults as per standard process as indicated.

## 2021-12-19 NOTE — Progress Notes (Signed)
PROGRESS NOTE    Leslie Mullen  XVQ:[id number] DOB: November 03, 1961 DOA: 12/16/2021 PCP: Sue Lush, PA-C   Brief Narrative:  Leslie Mullen is a 61 y.o. female with medical history significant of complete heart block status post [employer] PPM, diabetes mellitus type 2, endometrial cancer, morbid obesity, hypertension, metastatic pancreatic cancer. This morning when getting out of bed, she had some dizziness after standing. The next thing she remembers is being face down on the floor. She is concerned she blacked out. After regaining consciousness, she stayed on the floor for 20 minutes secondary to inability to get back up, but was her normal self and not confused. EMS was called and they were able to lift her up from the floor. She reports not taking any medications this morning.  Assessment & Plan:   GOALS OF CARE -Lengthy discussion today with patient, she had previously been DNR based on her diagnosis of metastatic pancreatic cancer. -Patient requested to transition to [place] today despite lengthy discussion that should she have worsening GI bleed that is due to her invasive pancreatic cancer they would be little to no options for treatment and CPR and intubation would not correct/treat the cause of her decompensation -Recommend ongoing conversation with oncology team, palliative care as well as family to ensure that goals of care and wishes are maintained as her disease progresses.  We also recommend filling out a MOST form  Symptomatic anemia- multifactorial - Likely chemotherapy/metastatic pancreatic cancer induced with poor production and concurrent GI bleed -Hemoglobin stabilizing over the past 24 hours - GI following, previous EGD concerning for infiltrative mass, GI recommending against endoscopy at this time given high risk for complications and worsening bleeding -Continue to hold Eliquis - 3u PRBC transfused at intake - hgb stable.   Syncope versus near syncope,  secondary to above - Follow-up posttransfusion, orthostatic vitals improving with p.o. intake - Telemetry ongoing - Echocardiogram -EF 60 to 65% normal; grade 1 diastolic dysfunction   Complete heart block (HCC) - Status post pacemaker implantation   Chronic pain - Continue home oxycodone 10 mg q6 hours prn   Anxiety - Continue home lorazepam   Hyperlipidemia - Continue home pravastatin   Obesity, Class III, BMI 40-49.9 (morbid obesity) ([place]) - Body mass index is 47.14 kg/m.   Stage 3a chronic kidney disease (CKD) (HCC) Baseline creatinine of about 1.3-1.4. Currently below baseline.   Pancreatic cancer metastasized to liver [facility])- (present on admission) Recent diagnosis (<6 months). Patient follows with Dr. Burr Medico as an outpatient. Currently on palliative chemotherapy.   Thrombocytosis- (present on admission) Platelets trended down slightly from recent office visit.   Paroxysmal atrial fibrillation ([place])- (present on admission) Patient is on Eliquis as an outpatient. Not on rate control medication. -Hold Eliquis for now; discussed with patient   Type 2 diabetes mellitus with stage 3a chronic kidney disease, with long-term current use of insulin ([place]) Patient is managed on Synjardy XR, Tresiba 20 units. Hemoglobin A1C of 8.6% from 10/22 -Semglee 20 units daily -SSI     DVT prophylaxis: SCDs Code Status: DNR; confirmed at bedside Family Communication: At bedside  Status is: Inpatient  Dispo: The patient is from: Home              Anticipated d/c is to: Home              Anticipated d/c date is: 40 to 72 hours pending clinical course  Patient currently not medically stable for discharge  Consultants:  Oncology  Procedures:  None  Antimicrobials:  None  Subjective: No acute issues or events overnight denies nausea vomiting diarrhea constipation headache fevers chills or chest pain  Objective: Vitals:   12/18/21 1332 12/18/21 2037 12/19/21 0000  12/19/21 0407  BP: (!) 112/59 126/69 116/67 130/75  Pulse: 89 83 73 80  Resp: 18 18 16 18   Temp: 99 F (37.2 C) 98.1 F (36.7 C)  98.2 F (36.8 C)  TempSrc: Oral Oral  Oral  SpO2: 99% 100% 99% 100%  Weight:      Height:        Intake/Output Summary (Last 24 hours) at 12/19/2021 0727 Last data filed at 12/19/2021 0407 Gross per 24 hour  Intake 370 ml  Output 400 ml  Net -30 ml    Filed Weights   12/16/21 1212 12/16/21 1902  Weight: (!) 140.6 kg (!) 139.7 kg    Examination:  General exam: Appears calm and comfortable  Respiratory system: Clear to auscultation. Respiratory effort normal. Cardiovascular system: S1 & S2 heard, RRR. No JVD, murmurs, rubs, gallops or clicks. No pedal edema. Gastrointestinal system: Abdomen is nondistended, soft and nontender. No organomegaly or masses felt. Normal bowel sounds heard. Central nervous system: Alert and oriented. No focal neurological deficits. Extremities: Symmetric 5 x 5 power. Skin: No rashes, lesions or ulcers Psychiatry: Judgement and insight appear normal. Mood & affect appropriate.   Data Reviewed: I have personally reviewed following labs and imaging studies  CBC: Recent Labs  Lab 12/16/21 1230 12/17/21 0340 12/17/21 1758 12/18/21 0349 12/19/21 0433  WBC 6.5 3.0*  --  6.4 6.3  NEUTROABS 4.3  --   --   --   --   HGB 5.9* 6.6* 7.2* 7.3* 7.3*  HCT 19.4* 21.1* 21.9* 22.9* 22.9*  MCV 90.7 85.4  --  85.4 87.4  PLT 650* 341  --  259 789    Basic Metabolic Panel: Recent Labs  Lab 12/16/21 1230 12/16/21 1630 12/17/21 0340 12/18/21 0349 12/19/21 0433  NA 139  --  138 141 140  K 3.4*  --  4.1 4.0 3.7  CL 108  --  106 107 103  CO2 24  --  26 27 27   GLUCOSE 187*  --  143* 97 124*  BUN 9  --  15 28* 23*  CREATININE 1.25*  --  1.72* 1.30* 1.09*  CALCIUM 7.3*  --  7.3* 7.7* 7.8*  MG  --  1.7  --   --   --     GFR: Estimated Creatinine Clearance: 82.6 mL/min (A) (by C-G formula based on SCr of 1.09 mg/dL  (H)). Liver Function Tests: Recent Labs  Lab 12/16/21 1230 12/17/21 0340  AST 36 25  ALT 16 15  ALKPHOS 57 50  BILITOT 0.5 0.7  PROT 6.0* 5.5*  ALBUMIN 1.8* 1.7*    Recent Labs  Lab 12/16/21 1230  LIPASE 23    No results for input(s): AMMONIA in the last 168 hours. Coagulation Profile: No results for input(s): INR, PROTIME in the last 168 hours. Cardiac Enzymes: No results for input(s): CKTOTAL, CKMB, CKMBINDEX, TROPONINI in the last 168 hours. BNP (last 3 results) No results for input(s): PROBNP in the last 8760 hours. HbA1C: No results for input(s): HGBA1C in the last 72 hours. CBG: Recent Labs  Lab 12/17/21 2030 12/18/21 2032  GLUCAP 160* 200*    Lipid Profile: No results for input(s): CHOL, HDL, LDLCALC, TRIG,  CHOLHDL, LDLDIRECT in the last 72 hours. Thyroid Function Tests: No results for input(s): TSH, T4TOTAL, FREET4, T3FREE, THYROIDAB in the last 72 hours. Anemia Panel: No results for input(s): VITAMINB12, FOLATE, FERRITIN, TIBC, IRON, RETICCTPCT in the last 72 hours. Sepsis Labs: Recent Labs  Lab 12/16/21 1230 12/16/21 1455  LATICACIDVEN 4.3* 2.4*     Recent Results (from the past 240 hour(s))  Resp Panel by RT-PCR (Flu A&B, Covid) Nasopharyngeal Swab     Status: None   Collection Time: 12/16/21  3:20 PM   Specimen: Nasopharyngeal Swab; Nasopharyngeal(NP) swabs in vial transport medium  Result Value Ref Range Status   SARS Coronavirus 2 by RT PCR NEGATIVE NEGATIVE Final    Comment: (NOTE) SARS-CoV-2 target nucleic acids are NOT DETECTED.  The SARS-CoV-2 RNA is generally detectable in upper respiratory specimens during the acute phase of infection. The lowest concentration of SARS-CoV-2 viral copies this assay can detect is 138 copies/mL. A negative result does not preclude SARS-Cov-2 infection and should not be used as the sole basis for treatment or other patient management decisions. A negative result may occur with  improper specimen  collection/handling, submission of specimen other than nasopharyngeal swab, presence of viral mutation(s) within the areas targeted by this assay, and inadequate number of viral copies(<138 copies/mL). A negative result must be combined with clinical observations, patient history, and epidemiological information. The expected result is Negative.  Fact Sheet for Patients:  EntrepreneurPulse.com.au  Fact Sheet for Healthcare Providers:  IncredibleEmployment.be  This test is no t yet approved or cleared by the [place] FDA and  has been authorized for detection and/or diagnosis of SARS-CoV-2 by FDA under an Emergency Use Authorization (EUA). This EUA will remain  in effect (meaning this test can be used) for the duration of the COVID-19 declaration under Section 564(b)(1) of the Act, 21 U.S.C.section 360bbb-3(b)(1), unless the authorization is terminated  or revoked sooner.       Influenza A by PCR NEGATIVE NEGATIVE Final   Influenza B by PCR NEGATIVE NEGATIVE Final    Comment: (NOTE) The Xpert Xpress SARS-CoV-2/FLU/RSV plus assay is intended as an aid in the diagnosis of influenza from Nasopharyngeal swab specimens and should not be used as a sole basis for treatment. Nasal washings and aspirates are unacceptable for Xpert Xpress SARS-CoV-2/FLU/RSV testing.  Fact Sheet for Patients: EntrepreneurPulse.com.au  Fact Sheet for Healthcare Providers: IncredibleEmployment.be  This test is not yet approved or cleared by the [place] FDA and has been authorized for detection and/or diagnosis of SARS-CoV-2 by FDA under an Emergency Use Authorization (EUA). This EUA will remain in effect (meaning this test can be used) for the duration of the COVID-19 declaration under Section 564(b)(1) of the Act, 21 U.S.C. section 360bbb-3(b)(1), unless the authorization is terminated or revoked.  Performed at [facility], [place] [address] [place], [place] [address]           Radiology Studies: ECHOCARDIOGRAM COMPLETE  Result Date: 12/17/2021    ECHOCARDIOGRAM REPORT   Patient Name:   YULIZA CARA Date of Exam: 12/17/2021 Medical Rec #:  [id number]    Height:       68.0 in Accession #:    [id number]   Weight:       308.0 lb Date of Birth:  08-07-1961    BSA:          2.455 m Patient Age:    76 years     BP:  124/71 mmHg Patient Gender: F            HR:           81 bpm. Exam Location:  Inpatient Procedure: 2D Echo Indications:     Syncope  History:         Patient has prior history of Echocardiogram examinations, most                  recent 10/10/2020. Risk Factors:Diabetes, Hypertension and                  Dyslipidemia. Complete heart block.  Sonographer:     Arlyss Gandy Referring Phys:  [phone] RALPH A NETTEY Diagnosing Phys: Fransico Him MD IMPRESSIONS  1. Left ventricular ejection fraction, by estimation, is 60 to 65%. The left ventricle has normal function. The left ventricle has no regional wall motion abnormalities. Left ventricular diastolic parameters are consistent with Grade I diastolic dysfunction (impaired relaxation).  2. Right ventricular systolic function is normal. The right ventricular size is normal. There is normal pulmonary artery systolic pressure. The estimated right ventricular systolic pressure is 71.0 mmHg.  3. The mitral valve is normal in structure. No evidence of mitral valve regurgitation. No evidence of mitral stenosis.  4. The aortic valve is tricuspid. Aortic valve regurgitation is not visualized. Aortic valve sclerosis is present, with no evidence of aortic valve stenosis.  5. The inferior vena cava is dilated in size with <50% respiratory variability, suggesting right atrial pressure of 15 mmHg. FINDINGS  Left Ventricle: Left ventricular ejection fraction, by estimation, is 60 to 65%. The left ventricle has normal function. The left ventricle has no  regional wall motion abnormalities. The left ventricular internal cavity size was normal in size. There is  no left ventricular hypertrophy. Left ventricular diastolic parameters are consistent with Grade I diastolic dysfunction (impaired relaxation). Normal left ventricular filling pressure. Right Ventricle: The right ventricular size is normal. No increase in right ventricular wall thickness. Right ventricular systolic function is normal. There is normal pulmonary artery systolic pressure. The tricuspid regurgitant velocity is 2.23 m/s, and  with an assumed right atrial pressure of 15 mmHg, the estimated right ventricular systolic pressure is 62.6 mmHg. Left Atrium: Left atrial size was normal in size. Right Atrium: Right atrial size was normal in size. Pericardium: There is no evidence of pericardial effusion. Mitral Valve: The mitral valve is normal in structure. No evidence of mitral valve regurgitation. No evidence of mitral valve stenosis. Tricuspid Valve: The tricuspid valve is normal in structure. Tricuspid valve regurgitation is trivial. No evidence of tricuspid stenosis. Aortic Valve: The aortic valve is tricuspid. Aortic valve regurgitation is not visualized. Aortic valve sclerosis is present, with no evidence of aortic valve stenosis. Aortic valve mean gradient measures 4.0 mmHg. Aortic valve peak gradient measures 7.4  mmHg. Aortic valve area, by VTI measures 2.99 cm. Pulmonic Valve: The pulmonic valve was normal in structure. Pulmonic valve regurgitation is not visualized. No evidence of pulmonic stenosis. Aorta: The aortic root is normal in size and structure. Venous: The inferior vena cava is dilated in size with less than 50% respiratory variability, suggesting right atrial pressure of 15 mmHg. IAS/Shunts: No atrial level shunt detected by color flow Doppler. Additional Comments: A device lead is visualized.  LEFT VENTRICLE PLAX 2D LVIDd:         4.70 cm   Diastology LVIDs:         3.40 cm   LV e'  medial:  6.09 cm/s LV PW:         1.00 cm   LV E/e' medial:  13.6 LV IVS:        1.00 cm   LV e' lateral:   6.31 cm/s LVOT diam:     2.30 cm   LV E/e' lateral: 13.2 LV SV:         82 LV SV Index:   34 LVOT Area:     4.15 cm  RIGHT VENTRICLE             IVC RV S prime:     11.30 cm/s  IVC diam: 2.50 cm TAPSE (M-mode): 1.8 cm LEFT ATRIUM             Index LA diam:        3.40 cm 1.38 cm/m LA Vol (A2C):   48.8 ml 19.88 ml/m LA Vol (A4C):   47.7 ml 19.43 ml/m LA Biplane Vol: 48.5 ml 19.75 ml/m  AORTIC VALVE AV Area (Vmax):    2.61 cm AV Area (Vmean):   2.68 cm AV Area (VTI):     2.99 cm AV Vmax:           136.00 cm/s AV Vmean:          95.100 cm/s AV VTI:            0.275 m AV Peak Grad:      7.4 mmHg AV Mean Grad:      4.0 mmHg LVOT Vmax:         85.50 cm/s LVOT Vmean:        61.400 cm/s LVOT VTI:          0.198 m LVOT/AV VTI ratio: 0.72  AORTA Ao Root diam: 3.10 cm Ao Asc diam:  2.90 cm MITRAL VALVE               TRICUSPID VALVE MV Area (PHT): 2.87 cm    TR Peak grad:   19.9 mmHg MV Decel Time: 264 msec    TR Vmax:        223.00 cm/s MV E velocity: 83.10 cm/s MV A velocity: 98.50 cm/s  SHUNTS MV E/A ratio:  0.84        Systemic VTI:  0.20 m                            Systemic Diam: 2.30 cm Fransico Him MD Electronically signed by Fransico Him MD Signature Date/Time: 12/17/2021/9:03:38 AM    Final (Updated)         Scheduled Meds:  Chlorhexidine Gluconate Cloth  6 each Topical Daily   gabapentin  300 mg Oral BID   insulin glargine-yfgn  20 Units Subcutaneous QHS   magic mouthwash w/lidocaine  5 mL Oral QID   mirtazapine  15 mg Oral QHS   multivitamin with minerals  1 tablet Oral Daily   pantoprazole  40 mg Oral Daily   pravastatin  40 mg Oral Daily   sodium chloride flush  10-40 mL Intracatheter Q12H   Continuous Infusions:   LOS: 1 day   Time spent: 18min  Teren Franckowiak C Damaris Abeln, DO [facility]  If 7PM-7AM, please contact night-coverage [email or web address]  12/19/2021, 7:27 AM

## 2021-12-19 NOTE — Progress Notes (Addendum)
Leslie Mullen   DOB:[birth year]   ZO#:[id number]   JWJ#:[id number]  Oncology follow up note   Subjective: Feels better overall.  Appetite fair.  States that she has been up walking around and doing fairly well.  She reported a small amount of hematochezia.  Her hemoglobin has been stable for several days now.  She is not having any nausea or vomiting at this time.  Reports mild abdominal discomfort.  Objective:  Vitals:   12/19/21 0407 12/19/21 1318  BP: 130/75 138/77  Pulse: 80 79  Resp: 18 16  Temp: 98.2 F (36.8 C) 98.7 F (37.1 C)  SpO2: 100% 100%    Body mass index is 46.83 kg/m.  Intake/Output Summary (Last 24 hours) at 12/19/2021 1343 Last data filed at 12/19/2021 1030 Gross per 24 hour  Intake --  Output 800 ml  Net -800 ml     Sclerae unicteric  Oropharynx clear  No peripheral adenopathy  Lungs clear -- no rales or rhonchi  Heart regular rate and rhythm  Abdomen benign  MSK no focal spinal tenderness, no peripheral edema  Neuro nonfocal   CBG (last 3)  Recent Labs    12/17/21 2030 12/18/21 2032  GLUCAP 160* 200*     Labs:  Urine Studies No results for input(s): UHGB, CRYS in the last 72 hours.  Invalid input(s): UACOL, UAPR, USPG, UPH, UTP, UGL, UKET, UBIL, UNIT, UROB, ULEU, UEPI, UWBC, URBC, UBAC, CAST, [place], [place]  Basic Metabolic Panel: Recent Labs  Lab 12/16/21 1230 12/16/21 1630 12/17/21 0340 12/18/21 0349 12/19/21 0433  NA 139  --  138 141 140  K 3.4*  --  4.1 4.0 3.7  CL 108  --  106 107 103  CO2 24  --  26 27 27   GLUCOSE 187*  --  143* 97 124*  BUN 9  --  15 28* 23*  CREATININE 1.25*  --  1.72* 1.30* 1.09*  CALCIUM 7.3*  --  7.3* 7.7* 7.8*  MG  --  1.7  --   --   --    GFR Estimated Creatinine Clearance: 82.6 mL/min (A) (by C-G formula based on SCr of 1.09 mg/dL (H)). Liver Function Tests: Recent Labs  Lab 12/16/21 1230 12/17/21 0340  AST 36 25  ALT 16 15  ALKPHOS 57 50  BILITOT 0.5 0.7  PROT 6.0* 5.5*  ALBUMIN 1.8* 1.7*    Recent Labs  Lab 12/16/21 1230  LIPASE 23   No results for input(s): AMMONIA in the last 168 hours. Coagulation profile No results for input(s): INR, PROTIME in the last 168 hours.  CBC: Recent Labs  Lab 12/16/21 1230 12/17/21 0340 12/17/21 1758 12/18/21 0349 12/19/21 0433  WBC 6.5 3.0*  --  6.4 6.3  NEUTROABS 4.3  --   --   --   --   HGB 5.9* 6.6* 7.2* 7.3* 7.3*  HCT 19.4* 21.1* 21.9* 22.9* 22.9*  MCV 90.7 85.4  --  85.4 87.4  PLT 650* 341  --  259 217   Cardiac Enzymes: No results for input(s): CKTOTAL, CKMB, CKMBINDEX, TROPONINI in the last 168 hours. BNP: Invalid input(s): POCBNP CBG: Recent Labs  Lab 12/17/21 2030 12/18/21 2032  GLUCAP 160* 200*   D-Dimer No results for input(s): DDIMER in the last 72 hours. Hgb A1c No results for input(s): HGBA1C in the last 72 hours. Lipid Profile No results for input(s): CHOL, HDL, LDLCALC, TRIG, CHOLHDL, LDLDIRECT in the last 72 hours. Thyroid function studies No results for input(s):  TSH, T4TOTAL, T3FREE, THYROIDAB in the last 72 hours.  Invalid input(s): FREET3 Anemia work up No results for input(s): VITAMINB12, FOLATE, FERRITIN, TIBC, IRON, RETICCTPCT in the last 72 hours. Microbiology Recent Results (from the past 240 hour(s))  Resp Panel by RT-PCR (Flu A&B, Covid) Nasopharyngeal Swab     Status: None   Collection Time: 12/16/21  3:20 PM   Specimen: Nasopharyngeal Swab; Nasopharyngeal(NP) swabs in vial transport medium  Result Value Ref Range Status   SARS Coronavirus 2 by RT PCR NEGATIVE NEGATIVE Final    Comment: (NOTE) SARS-CoV-2 target nucleic acids are NOT DETECTED.  The SARS-CoV-2 RNA is generally detectable in upper respiratory specimens during the acute phase of infection. The lowest concentration of SARS-CoV-2 viral copies this assay can detect is 138 copies/mL. A negative result does not preclude SARS-Cov-2 infection and should not be used as the sole basis for treatment or other patient  management decisions. A negative result may occur with  improper specimen collection/handling, submission of specimen other than nasopharyngeal swab, presence of viral mutation(s) within the areas targeted by this assay, and inadequate number of viral copies(<138 copies/mL). A negative result must be combined with clinical observations, patient history, and epidemiological information. The expected result is Negative.  Fact Sheet for Patients:  EntrepreneurPulse.com.au  Fact Sheet for Healthcare Providers:  IncredibleEmployment.be  This test is no t yet approved or cleared by the [place] FDA and  has been authorized for detection and/or diagnosis of SARS-CoV-2 by FDA under an Emergency Use Authorization (EUA). This EUA will remain  in effect (meaning this test can be used) for the duration of the COVID-19 declaration under Section 564(b)(1) of the Act, 21 U.S.C.section 360bbb-3(b)(1), unless the authorization is terminated  or revoked sooner.       Influenza A by PCR NEGATIVE NEGATIVE Final   Influenza B by PCR NEGATIVE NEGATIVE Final    Comment: (NOTE) The Xpert Xpress SARS-CoV-2/FLU/RSV plus assay is intended as an aid in the diagnosis of influenza from Nasopharyngeal swab specimens and should not be used as a sole basis for treatment. Nasal washings and aspirates are unacceptable for Xpert Xpress SARS-CoV-2/FLU/RSV testing.  Fact Sheet for Patients: EntrepreneurPulse.com.au  Fact Sheet for Healthcare Providers: IncredibleEmployment.be  This test is not yet approved or cleared by the [place] FDA and has been authorized for detection and/or diagnosis of SARS-CoV-2 by FDA under an Emergency Use Authorization (EUA). This EUA will remain in effect (meaning this test can be used) for the duration of the COVID-19 declaration under Section 564(b)(1) of the Act, 21 U.S.C. section 360bbb-3(b)(1),  unless the authorization is terminated or revoked.  Performed at [facility], [place] [address] [place], [place] [address]       Studies:  No results found.  Assessment: 61 y.o. female   Symptomatic anemia, secondary to chemotherapy and cancer, improved Syncope and hypotensive, secondary to anemia and dehydration, resolved CKD stage III  Pancreatic cancer with liver metastasis, on chemotherapy PAF DM Obesity     Plan:  -Continues to have a very small amount of blood in her stool but hemoglobin remains stable.  She has been seen by GI and no plans for endoscopy.  GI has recommended consideration of CTA if she has worsening of her bleeding. -Recommend PRBC transfusion to keep hemoglobin above 7.5. -continue IVF and supportive care -She will follow-up on 12/29/2021 and we will probably reduce her chemotherapy dose due to cytopenias.  Mikey Bussing, NP 12/19/2021    Addendum  I have seen the patient, examined her. I agree with the assessment and and plan and have edited the notes.   Pt is clinical stable, no bleeding, still feels quite fatigued. Lab reviewed, H/H has been stable, 7.3/22.9 today, I recommend 1u PRBC transfusion tomorrow morning. I will place the orders, I discussed with Dr. Avon Gully. Pt will likely to be discharged tomorrow. She is scheduled to see [place] back on 1/23, plan to reduce her chemo dose on next cycle. All questions were answered.   Truitt Merle  12/19/2021

## 2021-12-19 NOTE — Progress Notes (Signed)
Subjective: Scant blood in stool (less). Some mild abdominal soreness. Some nausea (from pain medications).  Objective: Vital signs in last 24 hours: Temp:  [98.1 F (36.7 C)-99 F (37.2 C)] 98.2 F (36.8 C) (01/13 0407) Pulse Rate:  [73-89] 80 (01/13 0407) Resp:  [16-18] 18 (01/13 0407) BP: (112-130)/(59-75) 130/75 (01/13 0407) SpO2:  [99 %-100 %] 100 % (01/13 0407) Weight change:  Last BM Date: 12/18/21  PE: GEN:  NAD, overweight  Lab Results: CBC    Component Value Date/Time   WBC 6.3 12/19/2021 0433   RBC 2.62 (L) 12/19/2021 0433   HGB 7.3 (L) 12/19/2021 0433   HGB 8.1 (L) 12/11/2021 1155   HCT 22.9 (L) 12/19/2021 0433   PLT 217 12/19/2021 0433   PLT 795 (H) 12/11/2021 1155   MCV 87.4 12/19/2021 0433   MCH 27.9 12/19/2021 0433   MCHC 31.9 12/19/2021 0433   RDW 19.3 (H) 12/19/2021 0433   LYMPHSABS 1.8 12/16/2021 1230   MONOABS 0.3 12/16/2021 1230   EOSABS 0.1 12/16/2021 1230   BASOSABS 0.0 12/16/2021 1230  CMP     Component Value Date/Time   NA 140 12/19/2021 0433   NA 137 12/22/2019 1712   K 3.7 12/19/2021 0433   CL 103 12/19/2021 0433   CO2 27 12/19/2021 0433   GLUCOSE 124 (H) 12/19/2021 0433   BUN 23 (H) 12/19/2021 0433   BUN 25 (H) 12/22/2019 1712   CREATININE 1.09 (H) 12/19/2021 0433   CREATININE 1.07 (H) 12/11/2021 1155   CALCIUM 7.8 (L) 12/19/2021 0433   PROT 5.5 (L) 12/17/2021 0340   PROT 7.0 12/22/2019 1712   ALBUMIN 1.7 (L) 12/17/2021 0340   ALBUMIN 3.6 (L) 12/22/2019 1712   AST 25 12/17/2021 0340   AST 13 (L) 12/11/2021 1155   ALT 15 12/17/2021 0340   ALT 7 12/11/2021 1155   ALKPHOS 50 12/17/2021 0340   BILITOT 0.7 12/17/2021 0340   BILITOT 0.3 12/11/2021 1155   GFRNONAA 59 (L) 12/19/2021 0433   GFRNONAA 60 (L) 12/11/2021 1155   GFRAA 49 (L) 12/22/2019 1712    Assessment:   Metastatic pancreatic cancer. Anemia, likely multifactorial. Hematochezia, essentially resolved.  Plan:   Advance diet as tolerated.  No further GI  work-up or testing needed or anticipated at this time.  [facility] will sign-off; please call with questions; she can follow-up with Dr. Lucienne Capers, MD at [facility] if GI follow-up needed; thank you for the consultation.   Landry Dyke 12/19/2021, 11:26 AM   Cell [phone] If no answer or after 5 PM call [phone]

## 2021-12-20 LAB — CBC
HCT: 23 % — ABNORMAL LOW (ref 36.0–46.0)
Hemoglobin: 7.2 g/dL — ABNORMAL LOW (ref 12.0–15.0)
MCH: 28 pg (ref 26.0–34.0)
MCHC: 31.3 g/dL (ref 30.0–36.0)
MCV: 89.5 fL (ref 80.0–100.0)
Platelets: 207 10*3/uL (ref 150–400)
RBC: 2.57 MIL/uL — ABNORMAL LOW (ref 3.87–5.11)
RDW: 19.4 % — ABNORMAL HIGH (ref 11.5–15.5)
WBC: 6.6 10*3/uL (ref 4.0–10.5)
nRBC: 1.5 % — ABNORMAL HIGH (ref 0.0–0.2)

## 2021-12-20 LAB — BASIC METABOLIC PANEL
Anion gap: 4 — ABNORMAL LOW (ref 5–15)
BUN: 20 mg/dL (ref 6–20)
CO2: 28 mmol/L (ref 22–32)
Calcium: 7.8 mg/dL — ABNORMAL LOW (ref 8.9–10.3)
Chloride: 106 mmol/L (ref 98–111)
Creatinine, Ser: 1.09 mg/dL — ABNORMAL HIGH (ref 0.44–1.00)
GFR, Estimated: 59 mL/min — ABNORMAL LOW (ref >60)
Glucose, Bld: 143 mg/dL — ABNORMAL HIGH (ref 70–99)
Potassium: 3.8 mmol/L (ref 3.5–5.1)
Sodium: 138 mmol/L (ref 135–145)

## 2021-12-20 LAB — PREPARE RBC (CROSSMATCH)

## 2021-12-20 MED ORDER — APIXABAN 5 MG PO TABS: 5.0000 mg | ORAL_TABLET | Freq: Two times a day (BID) | ORAL | 3 refills | Status: DC

## 2021-12-20 MED ORDER — HEPARIN SOD (PORK) LOCK FLUSH 100 UNIT/ML IV SOLN
500.0000 [IU] | INTRAVENOUS | Status: AC | PRN
  Administered 2021-12-20: 500 [IU]
  Filled 2021-12-20: qty 5

## 2021-12-20 NOTE — Discharge Summary (Signed)
Physician Discharge Summary  Leslie Mullen UMP:[id number] DOB: 07-09-1961 DOA: 12/16/2021  PCP: Sue Lush, PA-C  Admit date: 12/16/2021 Discharge date: 12/20/2021  Admitted From: Home Disposition:  Home  Recommendations for Outpatient Follow-up:  Follow up with PCP in 1-2 weeks Please obtain BMP/CBC in one week  Discharge Condition:Stable  CODE STATUS:Full  Diet recommendation: As tolerated    Brief/Interim Summary: Leslie Mullen is a 61 y.o. female with medical history significant of complete heart block status post [employer] PPM, diabetes mellitus type 2, endometrial cancer, morbid obesity, hypertension, metastatic pancreatic cancer. This morning when getting out of bed, she had some dizziness after standing. The next thing she remembers is being face down on the floor. She is concerned she blacked out. After regaining consciousness, she stayed on the floor for 20 minutes secondary to inability to get back up, but was her normal self and not confused. EMS was called and they were able to lift her up from the floor. She reports not taking any medications this morning.  Patient admitted in the setting of symptomatic anemia likely multifactorial in setting of chemotherapy and acute GI losses, GI concern for infiltrative pancreatic mass given previous endoscopy.  No endoscopy performed during his hospitalization given high risk for perforation in setting of previous imaging.  At this time patient's anemia stabilized, she is otherwise stable for discharge to follow-up with oncology in the next week to continue treatment per their expertise.  GI recommending to continue to hold Eliquis this week as tolerated, plan to resume Eliquis later this week, lengthy discussion with patient that any repeat or recurrent signs of bleeding she should immediately stop Eliquis and return to the hospital.   Assessment & Plan:   GOALS OF CARE -Lengthy discussion today with patient, she had previously been DNR  based on her diagnosis of metastatic pancreatic cancer. -Patient requested to transition to [place] today despite lengthy discussion that should she have worsening GI bleed that is due to her invasive pancreatic cancer they would be little to no options for treatment and CPR and intubation would not correct/treat the cause of her decompensation -Recommend ongoing conversation with oncology team, palliative care as well as family to ensure that goals of care and wishes are maintained as her disease progresses.  We also recommend filling out a MOST form  Symptomatic anemia- multifactorial - Likely chemotherapy/metastatic pancreatic cancer induced with poor production and concurrent GI bleed -Hemoglobin stabilizing over the past 48h   Syncope versus near syncope, secondary to above - Markedly improved after transfusion - Telemetry ongoing - Echocardiogram -EF 60 to 65% normal; grade 1 diastolic dysfunction   Complete heart block ([place]) - Status post pacemaker implantation   Chronic pain - Continue home oxycodone 10 mg q6 hours prn   Anxiety - Continue home lorazepam   Hyperlipidemia - Continue home pravastatin   Obesity, Class III, BMI 40-49.9 (morbid obesity) ([place]) - Body mass index is 47.14 kg/m.   Stage 3a chronic kidney disease (CKD) (HCC) Baseline creatinine of about 1.3-1.4. Currently below baseline.   Pancreatic cancer metastasized to liver [facility])- (present on admission) Recent diagnosis (<6 months). Patient follows with Dr. Burr Medico as an outpatient. Currently on palliative chemotherapy.   Thrombocytosis- (present on admission) Platelets trended down slightly from recent office visit.   Paroxysmal atrial fibrillation ([place])- (present on admission) Patient is on Eliquis as an outpatient. Not on rate control medication. -Hold Eliquis as above; discussed with patient   Type 2 diabetes mellitus with  stage 3a chronic kidney disease, with long-term current use of insulin  ([place]) Patient is managed on Synjardy XR, Tresiba 20 units. Hemoglobin A1C of 8.6% from 10/22 -Semglee 20 units daily  Discharge Instructions   Allergies as of 12/20/2021       Reactions   Penicillins Shortness Of Breath, Itching, Swelling   Has patient had a PCN reaction causing immediate rash, facial/tongue/throat swelling, SOB or lightheadedness with hypotension: Yes Has patient had a PCN reaction causing severe rash involving mucus membranes or skin necrosis:No Has patient had a PCN reaction that required hospitalization: No Has patient had a PCN reaction occurring within the last 10 years: no If all of the above answers are "NO", then may proceed with Cephalosporin use.          Medication List     TAKE these medications    amLODipine 10 MG tablet Commonly known as: NORVASC Take 1 tablet (10 mg total) by mouth at bedtime.   apixaban 5 MG Tabs tablet Commonly known as: Eliquis Take 1 tablet (5 mg total) by mouth 2 (two) times daily. Start taking on: December 23, 2021 What changed: These instructions start on December 23, 2021. If you are unsure what to do until then, ask your doctor or other care provider.   gabapentin 300 MG capsule Commonly known as: NEURONTIN Take 1 capsule (300 mg total) by mouth 2 (two) times daily.   glucose blood test strip   Insulin Pen Needle 31G X 5 MM Misc   lidocaine-prilocaine cream Commonly known as: EMLA Apply to affected area once   loratadine 10 MG tablet Commonly known as: CLARITIN Take 10 mg by mouth daily as needed for allergies.   LORazepam 0.5 MG tablet Commonly known as: ATIVAN Take 1 tablet (0.5 mg total) by mouth 2 (two) times daily as needed (for anxiety, and/or chemo-related nausea/vomiting).   magic mouthwash w/lidocaine Soln Take 5 mLs by mouth 4 (four) times daily.   mirtazapine 15 MG tablet Commonly known as: Remeron Take 1 tablet (15 mg total) by mouth at bedtime.   multivitamin capsule Take 1 capsule by  mouth daily.   omeprazole 40 MG capsule Commonly known as: PRILOSEC Take 40 mg by mouth 2 (two) times daily.   ondansetron 8 MG tablet Commonly known as: Zofran Take 1 tablet (8 mg total) by mouth 2 (two) times daily as needed (Nausea or vomiting).   OneTouch Delica Lancets 67E Misc   Oxycodone HCl 10 MG Tabs Take 1 tablet (10 mg total) by mouth every 6 (six) hours as needed. What changed: reasons to take this   polyethylene glycol 17 g packet Commonly known as: MIRALAX / GLYCOLAX Take 17 g by mouth daily as needed for moderate constipation.   pravastatin 40 MG tablet Commonly known as: PRAVACHOL TAKE 1 TABLET(40 MG) BY MOUTH DAILY   prochlorperazine 10 MG tablet Commonly known as: COMPAZINE TAKE 1 TABLET(10 MG) BY MOUTH EVERY 6 HOURS AS NEEDED FOR NAUSEA OR VOMITING   Synjardy XR 12.04-999 MG Tb24 Generic drug: Empagliflozin-metFORMIN HCl ER Take 1 tablet by mouth 2 (two) times daily.   Tyler Aas FlexTouch 200 UNIT/ML FlexTouch Pen Generic drug: insulin degludec Inject 20 Units into the skin every evening. Start at 20 units daily-if you blood glucose levels are still on the higher side-slowly increase back to your usual regimen of 80 units daily.   trolamine salicylate 10 % cream Commonly known as: ASPERCREME Apply 1 application topically 2 (two) times daily as needed for  muscle pain.        Allergies  Allergen Reactions   Penicillins Shortness Of Breath, Itching and Swelling    Has patient had a PCN reaction causing immediate rash, facial/tongue/throat swelling, SOB or lightheadedness with hypotension: Yes Has patient had a PCN reaction causing severe rash involving mucus membranes or skin necrosis:No Has patient had a PCN reaction that required hospitalization: No Has patient had a PCN reaction occurring within the last 10 years: no If all of the above answers are "NO", then may proceed with Cephalosporin use.      Consultations: GI  Procedures/Studies: CT  Head Wo Contrast  Result Date: 12/16/2021 CLINICAL DATA:  Head trauma, moderate to severe. Fall with head injury on anticoagulation. EXAM: CT HEAD WITHOUT CONTRAST TECHNIQUE: Contiguous axial images were obtained from the base of the skull through the vertex without intravenous contrast. COMPARISON:  None. FINDINGS: Brain: The brain shows a normal appearance without evidence of malformation, atrophy, old or acute small or large vessel infarction, mass lesion, hemorrhage, hydrocephalus or extra-axial collection. Vascular: There is atherosclerotic calcification of the major vessels at the base of the brain. Skull: Normal.  No traumatic finding.  No focal bone lesion. Sinuses/Orbits: Sinuses are clear. Orbits appear normal. Mastoids are clear. Other: None significant IMPRESSION: No acute or traumatic finding. Normal study with exception of atherosclerotic calcification of the major vessels at the base of brain. Electronically Signed   By: Nelson Chimes M.D.   On: 12/16/2021 13:19   ECHOCARDIOGRAM COMPLETE  Result Date: 12/17/2021    ECHOCARDIOGRAM REPORT   Patient Name:   JENESE MISCHKE Date of Exam: 12/17/2021 Medical Rec #:  [id number]    Height:       68.0 in Accession #:    [id number]   Weight:       308.0 lb Date of Birth:  Aug 30, 1961    BSA:          2.455 m Patient Age:    64 years     BP:           124/71 mmHg Patient Gender: F            HR:           81 bpm. Exam Location:  Inpatient Procedure: 2D Echo Indications:     Syncope  History:         Patient has prior history of Echocardiogram examinations, most                  recent 10/10/2020. Risk Factors:Diabetes, Hypertension and                  Dyslipidemia. Complete heart block.  Sonographer:     Arlyss Gandy Referring Phys:  [phone] RALPH A NETTEY Diagnosing Phys: Fransico Him MD IMPRESSIONS  1. Left ventricular ejection fraction, by estimation, is 60 to 65%. The left ventricle has normal function. The left ventricle has no regional wall motion  abnormalities. Left ventricular diastolic parameters are consistent with Grade I diastolic dysfunction (impaired relaxation).  2. Right ventricular systolic function is normal. The right ventricular size is normal. There is normal pulmonary artery systolic pressure. The estimated right ventricular systolic pressure is 62.1 mmHg.  3. The mitral valve is normal in structure. No evidence of mitral valve regurgitation. No evidence of mitral stenosis.  4. The aortic valve is tricuspid. Aortic valve regurgitation is not visualized. Aortic valve sclerosis is present, with no evidence of aortic valve stenosis.  5. The  inferior vena cava is dilated in size with <50% respiratory variability, suggesting right atrial pressure of 15 mmHg. FINDINGS  Left Ventricle: Left ventricular ejection fraction, by estimation, is 60 to 65%. The left ventricle has normal function. The left ventricle has no regional wall motion abnormalities. The left ventricular internal cavity size was normal in size. There is  no left ventricular hypertrophy. Left ventricular diastolic parameters are consistent with Grade I diastolic dysfunction (impaired relaxation). Normal left ventricular filling pressure. Right Ventricle: The right ventricular size is normal. No increase in right ventricular wall thickness. Right ventricular systolic function is normal. There is normal pulmonary artery systolic pressure. The tricuspid regurgitant velocity is 2.23 m/s, and  with an assumed right atrial pressure of 15 mmHg, the estimated right ventricular systolic pressure is 70.2 mmHg. Left Atrium: Left atrial size was normal in size. Right Atrium: Right atrial size was normal in size. Pericardium: There is no evidence of pericardial effusion. Mitral Valve: The mitral valve is normal in structure. No evidence of mitral valve regurgitation. No evidence of mitral valve stenosis. Tricuspid Valve: The tricuspid valve is normal in structure. Tricuspid valve regurgitation is  trivial. No evidence of tricuspid stenosis. Aortic Valve: The aortic valve is tricuspid. Aortic valve regurgitation is not visualized. Aortic valve sclerosis is present, with no evidence of aortic valve stenosis. Aortic valve mean gradient measures 4.0 mmHg. Aortic valve peak gradient measures 7.4  mmHg. Aortic valve area, by VTI measures 2.99 cm. Pulmonic Valve: The pulmonic valve was normal in structure. Pulmonic valve regurgitation is not visualized. No evidence of pulmonic stenosis. Aorta: The aortic root is normal in size and structure. Venous: The inferior vena cava is dilated in size with less than 50% respiratory variability, suggesting right atrial pressure of 15 mmHg. IAS/Shunts: No atrial level shunt detected by color flow Doppler. Additional Comments: A device lead is visualized.  LEFT VENTRICLE PLAX 2D LVIDd:         4.70 cm   Diastology LVIDs:         3.40 cm   LV e' medial:    6.09 cm/s LV PW:         1.00 cm   LV E/e' medial:  13.6 LV IVS:        1.00 cm   LV e' lateral:   6.31 cm/s LVOT diam:     2.30 cm   LV E/e' lateral: 13.2 LV SV:         82 LV SV Index:   34 LVOT Area:     4.15 cm  RIGHT VENTRICLE             IVC RV S prime:     11.30 cm/s  IVC diam: 2.50 cm TAPSE (M-mode): 1.8 cm LEFT ATRIUM             Index LA diam:        3.40 cm 1.38 cm/m LA Vol (A2C):   48.8 ml 19.88 ml/m LA Vol (A4C):   47.7 ml 19.43 ml/m LA Biplane Vol: 48.5 ml 19.75 ml/m  AORTIC VALVE AV Area (Vmax):    2.61 cm AV Area (Vmean):   2.68 cm AV Area (VTI):     2.99 cm AV Vmax:           136.00 cm/s AV Vmean:          95.100 cm/s AV VTI:            0.275 m AV Peak Grad:  7.4 mmHg AV Mean Grad:      4.0 mmHg LVOT Vmax:         85.50 cm/s LVOT Vmean:        61.400 cm/s LVOT VTI:          0.198 m LVOT/AV VTI ratio: 0.72  AORTA Ao Root diam: 3.10 cm Ao Asc diam:  2.90 cm MITRAL VALVE               TRICUSPID VALVE MV Area (PHT): 2.87 cm    TR Peak grad:   19.9 mmHg MV Decel Time: 264 msec    TR Vmax:        223.00  cm/s MV E velocity: 83.10 cm/s MV A velocity: 98.50 cm/s  SHUNTS MV E/A ratio:  0.84        Systemic VTI:  0.20 m                            Systemic Diam: 2.30 cm Fransico Him MD Electronically signed by Fransico Him MD Signature Date/Time: 12/17/2021/9:03:38 AM    Final (Updated)      Subjective: No acute issues/events overnight   Discharge Exam: Vitals:   12/20/21 1019 12/20/21 1306  BP: (!) 149/75 (!) 149/76  Pulse: 89 88  Resp: 18 19  Temp: 98 F (36.7 C) 98 F (36.7 C)  SpO2: 100% 99%   Vitals:   12/20/21 0642 12/20/21 0701 12/20/21 1019 12/20/21 1306  BP: 130/76 127/74 (!) 149/75 (!) 149/76  Pulse: 74 88 89 88  Resp: 18 20 18 19   Temp: 98 F (36.7 C) 98.2 F (36.8 C) 98 F (36.7 C) 98 F (36.7 C)  TempSrc: Oral Oral Oral Oral  SpO2: 99% 100% 100% 99%  Weight:      Height:        General: Pt is alert, awake, not in acute distress Cardiovascular: RRR, S1/S2 +, no rubs, no gallops Respiratory: CTA bilaterally, no wheezing, no rhonchi Abdominal: Soft, NT, ND, bowel sounds + Extremities: no edema, no cyanosis    The results of significant diagnostics from this hospitalization (including imaging, microbiology, ancillary and laboratory) are listed below for reference.     Microbiology: Recent Results (from the past 240 hour(s))  Resp Panel by RT-PCR (Flu A&B, Covid) Nasopharyngeal Swab     Status: None   Collection Time: 12/16/21  3:20 PM   Specimen: Nasopharyngeal Swab; Nasopharyngeal(NP) swabs in vial transport medium  Result Value Ref Range Status   SARS Coronavirus 2 by RT PCR NEGATIVE NEGATIVE Final    Comment: (NOTE) SARS-CoV-2 target nucleic acids are NOT DETECTED.  The SARS-CoV-2 RNA is generally detectable in upper respiratory specimens during the acute phase of infection. The lowest concentration of SARS-CoV-2 viral copies this assay can detect is 138 copies/mL. A negative result does not preclude SARS-Cov-2 infection and should not be used as the  sole basis for treatment or other patient management decisions. A negative result may occur with  improper specimen collection/handling, submission of specimen other than nasopharyngeal swab, presence of viral mutation(s) within the areas targeted by this assay, and inadequate number of viral copies(<138 copies/mL). A negative result must be combined with clinical observations, patient history, and epidemiological information. The expected result is Negative.  Fact Sheet for Patients:  EntrepreneurPulse.com.au  Fact Sheet for Healthcare Providers:  IncredibleEmployment.be  This test is no t yet approved or cleared by the [place] FDA and  has  been authorized for detection and/or diagnosis of SARS-CoV-2 by FDA under an Emergency Use Authorization (EUA). This EUA will remain  in effect (meaning this test can be used) for the duration of the COVID-19 declaration under Section 564(b)(1) of the Act, 21 U.S.C.section 360bbb-3(b)(1), unless the authorization is terminated  or revoked sooner.       Influenza A by PCR NEGATIVE NEGATIVE Final   Influenza B by PCR NEGATIVE NEGATIVE Final    Comment: (NOTE) The Xpert Xpress SARS-CoV-2/FLU/RSV plus assay is intended as an aid in the diagnosis of influenza from Nasopharyngeal swab specimens and should not be used as a sole basis for treatment. Nasal washings and aspirates are unacceptable for Xpert Xpress SARS-CoV-2/FLU/RSV testing.  Fact Sheet for Patients: EntrepreneurPulse.com.au  Fact Sheet for Healthcare Providers: IncredibleEmployment.be  This test is not yet approved or cleared by the [place] FDA and has been authorized for detection and/or diagnosis of SARS-CoV-2 by FDA under an Emergency Use Authorization (EUA). This EUA will remain in effect (meaning this test can be used) for the duration of the COVID-19 declaration under Section 564(b)(1) of the  Act, 21 U.S.C. section 360bbb-3(b)(1), unless the authorization is terminated or revoked.  Performed at [facility], [place] [address], [place], [place] [address]      Labs: BNP (last 3 results) No results for input(s): BNP in the last 8760 hours. Basic Metabolic Panel: Recent Labs  Lab 12/16/21 1230 12/16/21 1630 12/17/21 0340 12/18/21 0349 12/19/21 0433 12/20/21 0442  NA 139  --  138 141 140 138  K 3.4*  --  4.1 4.0 3.7 3.8  CL 108  --  106 107 103 106  CO2 24  --  26 27 27 28   GLUCOSE 187*  --  143* 97 124* 143*  BUN 9  --  15 28* 23* 20  CREATININE 1.25*  --  1.72* 1.30* 1.09* 1.09*  CALCIUM 7.3*  --  7.3* 7.7* 7.8* 7.8*  MG  --  1.7  --   --   --   --    Liver Function Tests: Recent Labs  Lab 12/16/21 1230 12/17/21 0340  AST 36 25  ALT 16 15  ALKPHOS 57 50  BILITOT 0.5 0.7  PROT 6.0* 5.5*  ALBUMIN 1.8* 1.7*   Recent Labs  Lab 12/16/21 1230  LIPASE 23   No results for input(s): AMMONIA in the last 168 hours. CBC: Recent Labs  Lab 12/16/21 1230 12/17/21 0340 12/17/21 1758 12/18/21 0349 12/19/21 0433 12/20/21 0442  WBC 6.5 3.0*  --  6.4 6.3 6.6  NEUTROABS 4.3  --   --   --   --   --   HGB 5.9* 6.6* 7.2* 7.3* 7.3* 7.2*  HCT 19.4* 21.1* 21.9* 22.9* 22.9* 23.0*  MCV 90.7 85.4  --  85.4 87.4 89.5  PLT 650* 341  --  259 217 207   Cardiac Enzymes: No results for input(s): CKTOTAL, CKMB, CKMBINDEX, TROPONINI in the last 168 hours. BNP: Invalid input(s): POCBNP CBG: Recent Labs  Lab 12/17/21 2030 12/18/21 2032 12/19/21 2051  GLUCAP 160* 200* 235*   D-Dimer No results for input(s): DDIMER in the last 72 hours. Hgb A1c No results for input(s): HGBA1C in the last 72 hours. Lipid Profile No results for input(s): CHOL, HDL, LDLCALC, TRIG, CHOLHDL, LDLDIRECT in the last 72 hours. Thyroid function studies No results for input(s): TSH, T4TOTAL, T3FREE, THYROIDAB in the last 72 hours.  Invalid input(s): FREET3 Anemia work up No  results for input(s): VITAMINB12, FOLATE, FERRITIN, TIBC, IRON, RETICCTPCT in the last 72 hours. Urinalysis    Component Value Date/Time   COLORURINE YELLOW 10/25/2021 0502   APPEARANCEUR CLEAR 10/25/2021 0502   LABSPEC 1.041 (H) 10/25/2021 0502   PHURINE 6.0 10/25/2021 0502   GLUCOSEU >=500 (A) 10/25/2021 0502   HGBUR MODERATE (A) 10/25/2021 0502   BILIRUBINUR NEGATIVE 10/25/2021 0502   KETONESUR 5 (A) 10/25/2021 0502   PROTEINUR 100 (A) 10/25/2021 0502   NITRITE NEGATIVE 10/25/2021 0502   LEUKOCYTESUR NEGATIVE 10/25/2021 0502   Sepsis Labs Invalid input(s): PROCALCITONIN,  WBC,  LACTICIDVEN Microbiology Recent Results (from the past 240 hour(s))  Resp Panel by RT-PCR (Flu A&B, Covid) Nasopharyngeal Swab     Status: None   Collection Time: 12/16/21  3:20 PM   Specimen: Nasopharyngeal Swab; Nasopharyngeal(NP) swabs in vial transport medium  Result Value Ref Range Status   SARS Coronavirus 2 by RT PCR NEGATIVE NEGATIVE Final    Comment: (NOTE) SARS-CoV-2 target nucleic acids are NOT DETECTED.  The SARS-CoV-2 RNA is generally detectable in upper respiratory specimens during the acute phase of infection. The lowest concentration of SARS-CoV-2 viral copies this assay can detect is 138 copies/mL. A negative result does not preclude SARS-Cov-2 infection and should not be used as the sole basis for treatment or other patient management decisions. A negative result may occur with  improper specimen collection/handling, submission of specimen other than nasopharyngeal swab, presence of viral mutation(s) within the areas targeted by this assay, and inadequate number of viral copies(<138 copies/mL). A negative result must be combined with clinical observations, patient history, and epidemiological information. The expected result is Negative.  Fact Sheet for Patients:  EntrepreneurPulse.com.au  Fact Sheet for Healthcare Providers:   IncredibleEmployment.be  This test is no t yet approved or cleared by the [place] FDA and  has been authorized for detection and/or diagnosis of SARS-CoV-2 by FDA under an Emergency Use Authorization (EUA). This EUA will remain  in effect (meaning this test can be used) for the duration of the COVID-19 declaration under Section 564(b)(1) of the Act, 21 U.S.C.section 360bbb-3(b)(1), unless the authorization is terminated  or revoked sooner.       Influenza A by PCR NEGATIVE NEGATIVE Final   Influenza B by PCR NEGATIVE NEGATIVE Final    Comment: (NOTE) The Xpert Xpress SARS-CoV-2/FLU/RSV plus assay is intended as an aid in the diagnosis of influenza from Nasopharyngeal swab specimens and should not be used as a sole basis for treatment. Nasal washings and aspirates are unacceptable for Xpert Xpress SARS-CoV-2/FLU/RSV testing.  Fact Sheet for Patients: EntrepreneurPulse.com.au  Fact Sheet for Healthcare Providers: IncredibleEmployment.be  This test is not yet approved or cleared by the [place] FDA and has been authorized for detection and/or diagnosis of SARS-CoV-2 by FDA under an Emergency Use Authorization (EUA). This EUA will remain in effect (meaning this test can be used) for the duration of the COVID-19 declaration under Section 564(b)(1) of the Act, 21 U.S.C. section 360bbb-3(b)(1), unless the authorization is terminated or revoked.  Performed at [facility], [place] [address], [place], [place] [address]      Time coordinating discharge: Over 30 minutes  SIGNED:   Little Ishikawa, DO [facility] 12/20/2021, 4:24 PM Pager   If 7PM-7AM, please contact night-coverage [email or web address]

## 2021-12-20 NOTE — Plan of Care (Addendum)
Pt received 1 RBC. PIV removed. Order placed for IV team to deaccess port. DC paperwork reviewed with pt. Pt denied questions.   Problem: Education: Goal: Knowledge of General Education information will improve Description: Including pain rating scale, medication(s)/side effects and non-pharmacologic comfort measures Outcome: Completed/Met   Problem: Health Behavior/Discharge Planning: Goal: Ability to manage health-related needs will improve Outcome: Completed/Met   Problem: Clinical Measurements: Goal: Ability to maintain clinical measurements within normal limits will improve Outcome: Completed/Met Goal: Will remain free from infection Outcome: Completed/Met Goal: Diagnostic test results will improve Outcome: Completed/Met Goal: Respiratory complications will improve Outcome: Completed/Met Goal: Cardiovascular complication will be avoided Outcome: Completed/Met   Problem: Activity: Goal: Risk for activity intolerance will decrease Outcome: Completed/Met   Problem: Nutrition: Goal: Adequate nutrition will be maintained Outcome: Completed/Met   Problem: Coping: Goal: Level of anxiety will decrease Outcome: Completed/Met   Problem: Elimination: Goal: Will not experience complications related to bowel motility Outcome: Completed/Met Goal: Will not experience complications related to urinary retention Outcome: Completed/Met   Problem: Pain Managment: Goal: General experience of comfort will improve Outcome: Completed/Met   Problem: Safety: Goal: Ability to remain free from injury will improve Outcome: Completed/Met   Problem: Skin Integrity: Goal: Risk for impaired skin integrity will decrease Outcome: Completed/Met

## 2021-12-20 NOTE — TOC Transition Note (Signed)
Transition of Care [facility]) - CM/SW Discharge Note   Patient Details  Name: Leslie Mullen MRN: [id number] Date of Birth: May 08, 1961  Transition of Care [facility]) CM/SW Contact:  Ross Ludwig, LCSW Phone Number: 12/20/2021, 11:24 AM   Clinical Narrative:      Transition of Care (TOC) Screening Note   Patient Details  Name: Leslie Mullen Date of Birth: June 17, 1961  Transition of Care Department [facility]) has reviewed patient and no TOC needs have been identified at this time. We will continue to monitor patient advancement through interdisciplinary progression rounds. If new patient transition needs arise, please place a TOC consult.     Final next level of care: Home/Self Care Barriers to Discharge: Barriers Resolved   Patient Goals and CMS Choice Patient states their goals for this hospitalization and ongoing recovery are:: To return back home.      Discharge Placement                       Discharge Plan and Services                                     Social Determinants of Health (SDOH) Interventions     Readmission Risk Interventions No flowsheet data found.

## 2021-12-22 ENCOUNTER — Telehealth: Payer: Self-pay | Admitting: *Deleted

## 2021-12-22 LAB — TYPE AND SCREEN
ABO/RH(D): B POS
Antibody Screen: NEGATIVE
Unit division: 0

## 2021-12-22 LAB — BPAM RBC
Blood Product Expiration Date: 202302062359
ISSUE DATE / TIME: 202301140640
Unit Type and Rh: 7300

## 2021-12-22 NOTE — Telephone Encounter (Signed)
Called to follow up after discharge.  Did not answer.  Will follow up with her on 1/17.

## 2021-12-22 NOTE — Telephone Encounter (Signed)
Leslie Mullen was contacted by telephone to verify understanding of discharge instructions status post their most recent discharge from the hospital on the date:  12/20/21.  Inpatient discharge AVS was re-reviewed with patient's daughter [name],  along with cancer center appointments.  Verification of understanding for oncology specific follow-up was validated using the Teach Back method.    Transportation to appointments were confirmed for the patient as being self/caregiver.  [name] questions were addressed to their satisfaction upon completion of this post discharge follow-up call for outpatient oncology.

## 2021-12-28 NOTE — Progress Notes (Signed)
Leslie Mullen   Telephone:(336) [phone] Fax:(336) [phone]   Clinic Follow up Note   Patient Care Team: Elinor Parkinson as PCP - General (Physician Assistant) Vickie Epley, MD as PCP - Electrophysiology (Cardiology) Josue Hector, MD as PCP - Cardiology (Cardiology) 12/29/2021  CHIEF COMPLAINT: Follow up pancreatic cancer   SUMMARY OF ONCOLOGIC HISTORY: Oncology History Overview Note  Cancer Staging Pancreatic cancer metastasized to liver [facility]) Staging form: Exocrine Pancreas, AJCC 8th Edition - Clinical stage from 10/14/2021: Stage IV (cT3, cN1, pM1) - Signed by Truitt Merle, MD on 10/14/2021    Pancreatic cancer metastasized to liver ([place])  09/24/2021 Imaging   EXAM: ULTRASOUND ABDOMEN LIMITED RIGHT UPPER QUADRANT  IMPRESSION: 5.3 cm lesion adjacent to the pancreatic head. While this may reflect a loop of bowel, a mass is not excluded. Consider CT abdomen with contrast for further evaluation.   09/25/2021 Imaging   EXAM: CT ABDOMEN AND PELVIS WITH CONTRAST  IMPRESSION: 4.1 cm low-density lesion in the pancreatic head/body, possibly reflecting focal pancreatitis given the clinical history. However, this warrants short-term follow-up in 4-6 weeks to document improvement/resolution. Alternatively, EUS could be considered for tissue sampling as clinically warranted.   10/06/2021 Imaging   EXAM: MRI ABDOMEN WITHOUT AND WITH CONTRAST  IMPRESSION: Findings of pancreatic neoplasm with hepatic metastatic disease, local nodal disease likely with greater than 180 degree involvement of the portal vein, sampling of LEFT hepatic lobe lesion may be helpful to initiate further workup. Subsequent local staging could be performed with pancreatic protocol CT and EUS as indicated.   Local invasion of stomach and duodenum are suspected.   Mild stranding about the pancreas could be indicative of mild superimposed pancreatitis or local tumor extension  particularly into the transverse mesocolon.   10/08/2021 Pathology Results   FINAL MICROSCOPIC DIAGNOSIS:   A. LIVER,  LEFT LOBE, BIOPSY:  -  Poorly differentiated carcinoma  -  See comment   COMMENT:  By immunohistochemistry, the neoplastic cells are positive for  cytokeratin 7 and weakly positive for HepPar.  The cells are negative for cytokeratin 20, GATA3, arginase, PAX8, CDX2 and TTF-1.  The immune O profile is nonspecific; however, the findings are compatible with a pancreatobiliary primary in the absence of other lesions.   10/14/2021 Initial Diagnosis   Pancreatic cancer metastasized to liver [facility])   10/14/2021 Cancer Staging   Staging form: Exocrine Pancreas, AJCC 8th Edition - Clinical stage from 10/14/2021: Stage IV (cT3, cN1, pM1) - Signed by Truitt Merle, MD on 10/14/2021 Stage prefix: Initial diagnosis    10/23/2021 -  Chemotherapy   Patient is on Treatment Plan : PANCREATIC Abraxane / Gemcitabine D1,8,15 q28d       CURRENT THERAPY: Gemcitabine and Abraxane days 1 and 8, q21 days. Started 10/23/21  INTERVAL HISTORY: Leslie Mullen returns for follow up as scheduled. Last seen outpatient 12/11/21 and began cycle 3 gem/abraxane. She presented to ED 12/16/21 with worsening fatigue and syncope, found to have hypotension and severe anemic with hgb 5.9. she reported mild hematochezia, was seen by GI inpatient who did not recommend endoscopies. She received total 4 units pRBCs during the hospitalization, discharge hgb 7.2. Her PAC incision opened while she was in flush today for port access and labs.  She presents today in a wheelchair, accompanied. She feels "okay" since hospital discharge. Still not very active, but awake and up at home. Eating is affected by metallic taste, followed by dietician. She has more noticeable abdominal discomfort and some  nausea but no vomiting. Partially managed by zofran and compazine at home. She manages constipation with prune juice and prn dulcolax, last BM  5 days ago but not unusual for her. She denies recurrent hematochezia. She is scared to restart eliquis due to recent syncopal episodes and anemia. She is willing to try. Denies recent fever, chills, cough, chest pain, dyspnea, leg edema, mucositis, neuropathy, rash, or any other concerns.    MEDICAL HISTORY:  Past Medical History:  Diagnosis Date   Abnormal uterine bleeding    Allergy    Anemia    Asthma    Cataract    BOTH EYES   Complete heart block ([place])    a. Medtr[employer] PPM 10/2020.   Diabetes ([place])    type II    Diabetic neuropathy ([place])    Endometrial cancer ([place])    Family history of colon cancer    GERD (gastroesophageal reflux disease)    Hyperlipidemia    Hypertension    Morbid obesity ([place])    Osteoarthritis    right knee    Pancreatitis    Pneumonia     SURGICAL HISTORY: Past Surgical History:  Procedure Laterality Date   CERVICAL BIOPSY     CESAREAN SECTION     x3   COLONOSCOPY WITH PROPOFOL N/A 06/29/2017   Procedure: COLONOSCOPY WITH PROPOFOL;  Surgeon: Irene Shipper, MD;  Location: [facility] ENDOSCOPY;  Service: Endoscopy;  Laterality: N/A;   CYSTECTOMY Right 2010   Cyst removed from right groin.    DILATION AND CURETTAGE OF UTERUS N/A 07/29/2021   Procedure: DILATATION AND CURETTAGE OF UTERUS;  Surgeon: Everitt Amber, MD;  Location: [facility] ORS;  Service: Gynecology;  Laterality: N/A;   INTRAUTERINE DEVICE (IUD) INSERTION N/A 07/29/2021   Procedure: INTRAUTERINE DEVICE (IUD) INSERTION;  Surgeon: Everitt Amber, MD;  Location: [facility] ORS;  Service: Gynecology;  Laterality: N/A;  IUD FROM MAIN PHARMACY   IR IMAGING GUIDED PORT INSERTION  11/11/2021   PACEMAKER IMPLANT N/A 10/11/2020   Procedure: PACEMAKER IMPLANT;  Surgeon: Vickie Epley, MD;  Location: [place] CV LAB;  Service: Cardiovascular;  Laterality: N/A;   TONSILLECTOMY      I have reviewed the social history and family history with the patient and they are unchanged from previous note.  ALLERGIES:  is  allergic to penicillins.  MEDICATIONS:  Current Outpatient Medications  Medication Sig Dispense Refill   amLODipine (NORVASC) 10 MG tablet Take 1 tablet (10 mg total) by mouth at bedtime. 30 tablet 0   apixaban (ELIQUIS) 5 MG TABS tablet Take 1 tablet (5 mg total) by mouth 2 (two) times daily. 60 tablet 3   gabapentin (NEURONTIN) 300 MG capsule Take 1 capsule (300 mg total) by mouth 2 (two) times daily.     glucose blood test strip      insulin degludec (TRESIBA FLEXTOUCH) 200 UNIT/ML FlexTouch Pen Inject 20 Units into the skin every evening. Start at 20 units daily-if you blood glucose levels are still on the higher side-slowly increase back to your usual regimen of 80 units daily.     Insulin Pen Needle 31G X 5 MM MISC      lidocaine-prilocaine (EMLA) cream Apply to affected area once 30 g 3   loratadine (CLARITIN) 10 MG tablet Take 10 mg by mouth daily as needed for allergies.     LORazepam (ATIVAN) 0.5 MG tablet Take 1 tablet (0.5 mg total) by mouth 2 (two) times daily as needed (for anxiety, and/or chemo-related nausea/vomiting). [place]  tablet 0   magic mouthwash w/lidocaine SOLN Take 5 mLs by mouth 4 (four) times daily. 470 mL 2   mirtazapine (REMERON) 15 MG tablet Take 1 tablet (15 mg total) by mouth at bedtime. 30 tablet 2   Multiple Vitamin (MULTIVITAMIN) capsule Take 1 capsule by mouth daily.     omeprazole (PRILOSEC) 40 MG capsule Take 40 mg by mouth 2 (two) times daily.     ondansetron (ZOFRAN) 8 MG tablet Take 1 tablet (8 mg total) by mouth 2 (two) times daily as needed (Nausea or vomiting). 30 tablet 1   ONETOUCH DELICA LANCETS 56L MISC      Oxycodone HCl 10 MG TABS Take 1 tablet (10 mg total) by mouth every 6 (six) hours as needed. (Patient taking differently: Take 10 mg by mouth every 6 (six) hours as needed (pain).) 120 tablet 0   polyethylene glycol (MIRALAX / GLYCOLAX) 17 g packet Take 17 g by mouth daily as needed for moderate constipation.     pravastatin (PRAVACHOL) 40 MG  tablet TAKE 1 TABLET(40 MG) BY MOUTH DAILY 90 tablet 1   prochlorperazine (COMPAZINE) 10 MG tablet TAKE 1 TABLET(10 MG) BY MOUTH EVERY 6 HOURS AS NEEDED FOR NAUSEA OR VOMITING 30 tablet 1   SYNJARDY XR 12.04-999 MG TB24 Take 1 tablet by mouth 2 (two) times daily.     trolamine salicylate (ASPERCREME) 10 % cream Apply 1 application topically 2 (two) times daily as needed for muscle pain.     No current facility-administered medications for this visit.    PHYSICAL EXAMINATION: ECOG PERFORMANCE STATUS: 1-2  Vitals:   12/29/21 1203  BP: (!) 154/74  Pulse: 84  Resp: 18  SpO2: 100%   Filed Weights   12/29/21 1203  Weight: (!) 300 lb 3 oz (136.2 kg)    GENERAL:alert, no distress and comfortable SKIN: no rash  EYES: sclera clear LUNGS: clear with normal breathing effort HEART: regular rate & rhythm, no lower extremity edema ABDOMEN:abdomen soft, non-tender and normal bowel sounds NEURO: alert & oriented x 3 with fluent speech, no focal motor/sensory deficits PAC incision open with minimal bleeding, no surrounding erythema, edema, or purulent drainage. Cleaned with betadine and covered with sterile dressing  LABORATORY DATA:  I have reviewed the data as listed CBC Latest Ref Rng & Units 12/29/2021 12/20/2021 12/19/2021  WBC 4.0 - 10.5 K/uL 12.8(H) 6.6 6.3  Hemoglobin 12.0 - 15.0 g/dL 11.2(L) 7.2(L) 7.3(L)  Hematocrit 36.0 - 46.0 % 35.4(L) 23.0(L) 22.9(L)  Platelets 150 - 400 K/uL 355 207 217     CMP Latest Ref Rng & Units 12/29/2021 12/20/2021 12/19/2021  Glucose 70 - 99 mg/dL 238(H) 143(H) 124(H)  BUN 6 - 20 mg/dL 11 20 23(H)  Creatinine 0.44 - 1.00 mg/dL 1.08(H) 1.09(H) 1.09(H)  Sodium 135 - 145 mmol/L 137 138 140  Potassium 3.5 - 5.1 mmol/L 4.5 3.8 3.7  Chloride 98 - 111 mmol/L 102 106 103  CO2 22 - 32 mmol/L 26 28 27   Calcium 8.9 - 10.3 mg/dL 8.1(L) 7.8(L) 7.8(L)  Total Protein 6.5 - 8.1 g/dL 6.7 - -  Total Bilirubin 0.3 - 1.2 mg/dL 0.4 - -  Alkaline Phos 38 - 126 U/L 100 -  -  AST 15 - 41 U/L 17 - -  ALT 0 - 44 U/L 8 - -      RADIOGRAPHIC STUDIES: I have personally reviewed the radiological images as listed and agreed with the findings in the report. No results found.   ASSESSMENT &  PLAN: Leslie Mullen is a 61 y.o. female with    1. Pancreatic cancer metastasized to liver, stage IV  -initially presented with abdominal/epigastric pain. [place] was performed at her first ED visit on 09/24/21 showing a 5.3 cm lesion adjacent to pancreatic head. Follow up CT AP showed 4.1 cm lesion in pancreatic head. She was recommended to f/u with GI. -she returned to ED on 10/05/21 and was admitted. Abd MRI showed findings of pancreatic neoplasm with hepatic metastatic disease, local nodal disease likely, local invasion of stomach and duodenum suspected, and mild stranding about pancreas. -liver biopsy was performed 10/08/21 and confirmed poorly differentiated carcinoma, compatible with a pancreatobiliary primary. Chest CT 10/14/21 negative for metastatic disease. -she started first line palliative gemcitabine/abraxane on 10/23/21. She tolerated C1D1 rather poorly with severe nausea and vomiting, prompting ED visit. -CT AP performed in ED 10/25/21 showed slight enlargement to pancreatic head mass, now 6 cm, with adjacent lymphadenopathy and liver metastasis. -Leslie Mullen appears stable. S/p C2D8 with dose-reduced abraxane and addition of aloxi premed. She tolerated much better, N/V improved. Mucositis managed with mouthwash. Appetite improved with mirtazapine. Pain is stable on current regimen.  -She is able to recover and function mostly well, still gets fatigued. No clinical evidence of disease progression.  -underwent PAC placement, tolerated well -Labs reviewed, adequate to proceed with C2D1 gemcitabine and dose-reduced abraxane, same dose and same premeds.  -return C2D8 next week, then f/up with cycle 3 in 3 weeks. She knows to call sooner if needed.   2. Symptom Management: Nausea,  constipation, Loss of appetite, Epigastric pain, Fatigue -N/V better managed with scheduled zofran and compazine, she has ativan if needed but hasn't taken due to being on oxycodone. I reviewed med administration with her again today  -appetite is adequate on mirtazapine but she is limited by metallic taste, continues to lose weight. Continue dietician f/up  -anemia worsened lately, she was hospitalized and transfused 4 units. She has recovered to hgb 11.2 today  -we recommend to dose reduce chemo to once every 2 weeks for cytopenia    3. Goal of care discussion, Social support -she understands the incurable nature of her cancer, and the overall poor prognosis, especially if she does not have good response to chemotherapy or progress on chemo -The patient understands the goal of care is palliative. -she is full code now  -she declined need for SW, chaplain, financial or other supportive resources at this time    Disposition:  Leslie Mullen appears stable. She is s/p 2 cycles of gem/abraxane on days 1 and 8 q21 days. She tolerates treatment moderately well with fatigue, taste change, nausea, and constipation. Side effects are partially managed, I reviewed symptom management.   She completed C3 day 1 but was subsequently hospitalized for syncope and severe anemia, hgb 5.9. I reviewed her hospital course. GI seen for hematochezia did not recommend endoscopies. Imaging was not done. She received 4 units pRBCs. Labs today show Hgb 11.2, she has responded well and symptoms improved. No recurrent bleeding. I recommend to restart eliquis 5 mg BID. She agrees.   The case was discussed with Dr. Burr Medico. Due to her tolerance issues and severe anemia, we recommend to reduce chemo to gem/abraxane once every 2 weeks. Due to open port incision and upcoming replacement by IR this week, we recommend gemcitabine alone through peripheral vein today. Patient agrees.   We will see her back in 2 weeks for f/up and next  cycle. Plan to restage in Feb.  Orders Placed This Encounter  Procedures   IR Removal Tun Access W/ Port W/O FL    Rt PAC dehiscence assessed by IR PA on called 12/29/2021.  After assessment, Dr. Burr Medico notified that [facility] will need to be removed and new PAC will need to be placed.    Standing Status:   Future    Standing Expiration Date:   12/29/2022    Order Specific Question:   Reason for exam:    Answer:   [facility] Dehiscence    Order Specific Question:   Is the patient pregnant?    Answer:   No    Order Specific Question:   Preferred Imaging Location?    Answer:   [facility]   IR IMAGING GUIDED PORT INSERTION    Standing Status:   Future    Standing Expiration Date:   12/29/2022    Order Specific Question:   Reason for Exam (SYMPTOM  OR DIAGNOSIS REQUIRED)    Answer:   Chemotherapy    Order Specific Question:   Is the patient pregnant?    Answer:   No    Order Specific Question:   Preferred Imaging Location?    Answer:   [facility]    Order Specific Question:   Call Results- Best Contact Number?    Answer:   Dr. Truitt Merle [phone]   All questions were answered. The patient knows to call the clinic with any problems, questions or concerns. No barriers to learning was detected. I spent 25 minutes counseling the patient face to face. The total time spent in the appointment was 40 minutes and more than 50% was on counseling and review of test results     Alla Feeling, NP 12/29/21

## 2021-12-29 ENCOUNTER — Inpatient Hospital Stay: Payer: 59

## 2021-12-29 ENCOUNTER — Other Ambulatory Visit: Payer: Self-pay

## 2021-12-29 ENCOUNTER — Encounter: Payer: Self-pay | Admitting: Nurse Practitioner

## 2021-12-29 ENCOUNTER — Inpatient Hospital Stay: Payer: 59 | Admitting: Nurse Practitioner

## 2021-12-29 ENCOUNTER — Other Ambulatory Visit: Payer: Self-pay | Admitting: Radiology

## 2021-12-29 VITALS — BP 154/74 | HR 84 | Resp 18 | Wt 300.2 lb

## 2021-12-29 DIAGNOSIS — C787 Secondary malignant neoplasm of liver and intrahepatic bile duct: Secondary | ICD-10-CM

## 2021-12-29 DIAGNOSIS — Z79899 Other long term (current) drug therapy: Secondary | ICD-10-CM | POA: Diagnosis not present

## 2021-12-29 DIAGNOSIS — C259 Malignant neoplasm of pancreas, unspecified: Secondary | ICD-10-CM

## 2021-12-29 DIAGNOSIS — Z5111 Encounter for antineoplastic chemotherapy: Secondary | ICD-10-CM | POA: Diagnosis present

## 2021-12-29 DIAGNOSIS — C250 Malignant neoplasm of head of pancreas: Secondary | ICD-10-CM | POA: Diagnosis present

## 2021-12-29 LAB — CMP (CANCER CENTER ONLY)
ALT: 8 U/L (ref 0–44)
AST: 17 U/L (ref 15–41)
Albumin: 2.4 g/dL — ABNORMAL LOW (ref 3.5–5.0)
Alkaline Phosphatase: 100 U/L (ref 38–126)
Anion gap: 9 (ref 5–15)
BUN: 11 mg/dL (ref 6–20)
CO2: 26 mmol/L (ref 22–32)
Calcium: 8.1 mg/dL — ABNORMAL LOW (ref 8.9–10.3)
Chloride: 102 mmol/L (ref 98–111)
Creatinine: 1.08 mg/dL — ABNORMAL HIGH (ref 0.44–1.00)
GFR, Estimated: 59 mL/min — ABNORMAL LOW (ref >60)
Glucose, Bld: 238 mg/dL — ABNORMAL HIGH (ref 70–99)
Potassium: 4.5 mmol/L (ref 3.5–5.1)
Sodium: 137 mmol/L (ref 135–145)
Total Bilirubin: 0.4 mg/dL (ref 0.3–1.2)
Total Protein: 6.7 g/dL (ref 6.5–8.1)

## 2021-12-29 LAB — CBC WITH DIFFERENTIAL (CANCER CENTER ONLY)
Abs Immature Granulocytes: 0.05 10*3/uL (ref 0.00–0.07)
Basophils Absolute: 0 10*3/uL (ref 0.0–0.1)
Basophils Relative: 0 %
Eosinophils Absolute: 0 10*3/uL (ref 0.0–0.5)
Eosinophils Relative: 0 %
HCT: 35.4 % — ABNORMAL LOW (ref 36.0–46.0)
Hemoglobin: 11.2 g/dL — ABNORMAL LOW (ref 12.0–15.0)
Immature Granulocytes: 0 %
Lymphocytes Relative: 12 %
Lymphs Abs: 1.5 10*3/uL (ref 0.7–4.0)
MCH: 28.1 pg (ref 26.0–34.0)
MCHC: 31.6 g/dL (ref 30.0–36.0)
MCV: 88.9 fL (ref 80.0–100.0)
Monocytes Absolute: 0.8 10*3/uL (ref 0.1–1.0)
Monocytes Relative: 6 %
Neutro Abs: 10.3 10*3/uL — ABNORMAL HIGH (ref 1.7–7.7)
Neutrophils Relative %: 82 %
Platelet Count: 355 10*3/uL (ref 150–400)
RBC: 3.98 MIL/uL (ref 3.87–5.11)
RDW: 20 % — ABNORMAL HIGH (ref 11.5–15.5)
WBC Count: 12.8 10*3/uL — ABNORMAL HIGH (ref 4.0–10.5)
nRBC: 0 % (ref 0.0–0.2)

## 2021-12-29 LAB — SAMPLE TO BLOOD BANK

## 2021-12-29 MED ORDER — PALONOSETRON HCL INJECTION 0.25 MG/5ML
0.2500 mg | Freq: Once | INTRAVENOUS | Status: AC
  Administered 2021-12-29: 0.25 mg via INTRAVENOUS
  Filled 2021-12-29: qty 5

## 2021-12-29 MED ORDER — SODIUM CHLORIDE 0.9 % IV SOLN: Freq: Once | INTRAVENOUS | Status: AC

## 2021-12-29 MED ORDER — PROCHLORPERAZINE MALEATE 10 MG PO TABS
10.0000 mg | ORAL_TABLET | Freq: Once | ORAL | Status: AC
  Administered 2021-12-29: 10 mg via ORAL
  Filled 2021-12-29: qty 1

## 2021-12-29 MED ORDER — SODIUM CHLORIDE 0.9 % IV SOLN
1000.0000 mg/m2 | Freq: Once | INTRAVENOUS | Status: AC
  Administered 2021-12-29: 2698 mg via INTRAVENOUS
  Filled 2021-12-29: qty 70.96

## 2021-12-29 MED ORDER — SODIUM CHLORIDE 0.9% FLUSH: 10.0000 mL | Freq: Once | INTRAVENOUS | Status: DC

## 2021-12-29 NOTE — Progress Notes (Signed)
Placed orders for PAC removal and new PAC placement by 01/09/2022.  Contacted IR [phone] LVM for Tiffany to give me a call regarding PAC appts from removal and replacement.

## 2021-12-29 NOTE — Progress Notes (Signed)
Spoke with pt via telephone to inform her of an appt with [facility] IR on 12/31/2021 @3pm  for RT PAC removal and new PAC placement.  Pt is to be NPO after 9am on 12/31/2021 and must have a driver.  Pt verbalized understanding of instructions and had no further questions or concerns at this time.

## 2021-12-29 NOTE — Progress Notes (Signed)
Continue Aloxi as premedication with Gemzar due to persistent nausea.  T.O. Cira Rue, NP/Kaysi Ourada Ronnald Ramp, PharmD

## 2021-12-29 NOTE — Progress Notes (Unsigned)
Interventional Radiology Progress Note  Leslie Mullen was referred to [facility] today from the infusion center, with disruption of the right IJ port site suture line.   The suture line has dehiscence.  Approximately 1 inch.   I explained that I feel the dehiscence is too large to allow secondary intention healing without removal of the port.  No sign of infection.  At this time, dry dressing coverage is all that is needed.   We will set her up for removal of the port and placement of a new access.    Do not submerge the site, with dry dressing changes until port removal.   Please call with any questions/concerns.   Signed,  Dulcy Fanny. Earleen Newport, DO

## 2021-12-29 NOTE — Patient Instructions (Signed)
[facility] MEDICAL ONCOLOGY  Discharge Instructions: Thank you for choosing [facility] to provide your oncology and hematology care.   If you have a lab appointment with the Cancer Center, please go directly to the Cancer Center and check in at the registration area.   Wear comfortable clothing and clothing appropriate for easy access to any Portacath or PICC line.   We strive to give you quality time with your provider. You may need to reschedule your appointment if you arrive late (15 or more minutes).  Arriving late affects you and other patients whose appointments are after yours.  Also, if you miss three or more appointments without notifying the office, you may be dismissed from the clinic at the provider's discretion.      For prescription refill requests, have your pharmacy contact our office and allow 72 hours for refills to be completed.    Today you received the following chemotherapy and/or immunotherapy agents Gemzar      To help prevent nausea and vomiting after your treatment, we encourage you to take your nausea medication as directed.  BELOW ARE SYMPTOMS THAT SHOULD BE REPORTED IMMEDIATELY: *FEVER GREATER THAN 100.4 F (38 C) OR HIGHER *CHILLS OR SWEATING *NAUSEA AND VOMITING THAT IS NOT CONTROLLED WITH YOUR NAUSEA MEDICATION *UNUSUAL SHORTNESS OF BREATH *UNUSUAL BRUISING OR BLEEDING *URINARY PROBLEMS (pain or burning when urinating, or frequent urination) *BOWEL PROBLEMS (unusual diarrhea, constipation, pain near the anus) TENDERNESS IN MOUTH AND THROAT WITH OR WITHOUT PRESENCE OF ULCERS (sore throat, sores in mouth, or a toothache) UNUSUAL RASH, SWELLING OR PAIN  UNUSUAL VAGINAL DISCHARGE OR ITCHING   Items with * indicate a potential emergency and should be followed up as soon as possible or go to the Emergency Department if any problems should occur.  Please show the CHEMOTHERAPY ALERT CARD or IMMUNOTHERAPY ALERT CARD at check-in to the  Emergency Department and triage nurse.  Should you have questions after your visit or need to cancel or reschedule your appointment, please contact [facility] MEDICAL [facility]COL[facility]  Dept: [phone]  and follow the prompts.  Office hours are 8:00 a.m. to 4:30 p.m. Monday - Friday. Please note that voicemails left after 4:00 p.m. may not be returned until the following business day.  We are closed weekends and major holidays. You have access to a nurse at all times for urgent questions. Please call the main number to the clinic Dept: [phone] and follow the prompts.   For any non-urgent questions, you may also contact your provider using MyChart. We now offer e-Visits for anyone 18 and older to request care online for non-urgent symptoms. For details visit [email or web address].   Also download the MyChart app! Go to the app store, search "MyChart", open the app, select [facility], and log in with your MyChart username and password.  Due to Covid, a mask is required upon entering the hospital/clinic. If you do not have a mask, one will be given to you upon arrival. For doctor visits, patients may have 1 support person aged 18 or older with them. For treatment visits, patients cannot have anyone with them due to current Covid guidelines and our immunocompromised population.   

## 2021-12-29 NOTE — Progress Notes (Signed)
Nutrition Follow-up:  Patient with pancreatic cancer stage IV, followed by Dr Burr Medico.  Patient receiving only gemcitabine today.  Noted recent hospitalizations for anemia.    Met with patient during infusion. Patient reports that appetite is a little bit better.  Having challenges with taste change.  Report that meats have a metallic taste.  Was able to taste pasta salad yesterday and has been eating chili and hot and spicy smoked sausage.  Can tolerate carnation instant breakfast mixed with milk.  Family is also making smoothies using either 2% milk or almond milk and fruit.      Medications: remeron  Labs: reviewed  Anthropometrics:   Weight 300 lb 3 oz today, decreased  306 lb 12.8 oz on 12/20 310 lb on 12/13 319 lb 4 oz on 11/25 330 lb 14.4 oz on 11/8   NUTRITION DIAGNOSIS: Inadequate oral intake continues with weight loss   INTERVENTION:  Encouraged using 2% milk in smoothies for more calories and protein. Can add packet of carnation instant breakfast as well. Encouraged patient to try pasta salad dressing on other foods (ie marinate meats, use as a dip, dressing, etc).   Continue baking soda and salt water rinses.      MONITORING, EVALUATION, GOAL: weight trends, intake   NEXT VISIT: to be determined with treatment  Leslie Mullen B. Zenia Resides, [place], [place] Registered Dietitian [phone] (mobile)

## 2021-12-29 NOTE — Progress Notes (Signed)
Pt presented to flush room with port site clean/dry/intact. Cleaned site with sterile technique per protocol. Palpated port for needle insertion, and incision from port insertion opened, bleeding. Port deaccessed, cleaned with sterile technique betadine, sterile gauze and tegaderm. Pt is to go to IR for further evail per NP.

## 2021-12-30 LAB — CANCER ANTIGEN 19-9: CA 19-9: 130 U/mL — ABNORMAL HIGH (ref 0–35)

## 2021-12-31 ENCOUNTER — Other Ambulatory Visit: Payer: Self-pay

## 2021-12-31 ENCOUNTER — Other Ambulatory Visit: Payer: Self-pay | Admitting: Student

## 2021-12-31 ENCOUNTER — Ambulatory Visit (HOSPITAL_COMMUNITY): Payer: 59

## 2021-12-31 ENCOUNTER — Inpatient Hospital Stay (HOSPITAL_COMMUNITY): Admission: RE | Admit: 2021-12-31 | Payer: 59 | Source: Ambulatory Visit

## 2021-12-31 ENCOUNTER — Telehealth: Payer: Self-pay | Admitting: Hematology

## 2021-12-31 NOTE — Telephone Encounter (Signed)
Left message with follow-up appointments per 1/23 los.

## 2022-01-01 ENCOUNTER — Other Ambulatory Visit: Payer: Self-pay

## 2022-01-01 ENCOUNTER — Inpatient Hospital Stay (HOSPITAL_COMMUNITY)
Admission: EM | Admit: 2022-01-01 | Discharge: 2022-01-05 | DRG: 314 | Disposition: A | Discharge status: Home Health | Payer: 59 | Attending: Internal Medicine | Admitting: Internal Medicine

## 2022-01-01 ENCOUNTER — Emergency Department (HOSPITAL_COMMUNITY): Payer: 59

## 2022-01-01 ENCOUNTER — Encounter (HOSPITAL_COMMUNITY): Payer: Self-pay

## 2022-01-01 DIAGNOSIS — E1122 Type 2 diabetes mellitus with diabetic chronic kidney disease: Secondary | ICD-10-CM | POA: Diagnosis present

## 2022-01-01 DIAGNOSIS — C787 Secondary malignant neoplasm of liver and intrahepatic bile duct: Secondary | ICD-10-CM | POA: Diagnosis not present

## 2022-01-01 DIAGNOSIS — I480 Paroxysmal atrial fibrillation: Secondary | ICD-10-CM | POA: Diagnosis present

## 2022-01-01 DIAGNOSIS — R55 Syncope and collapse: Secondary | ICD-10-CM

## 2022-01-01 DIAGNOSIS — Z7985 Long-term (current) use of injectable non-insulin antidiabetic drugs: Secondary | ICD-10-CM

## 2022-01-01 DIAGNOSIS — F419 Anxiety disorder, unspecified: Secondary | ICD-10-CM | POA: Diagnosis present

## 2022-01-01 DIAGNOSIS — R42 Dizziness and giddiness: Secondary | ICD-10-CM

## 2022-01-01 DIAGNOSIS — C250 Malignant neoplasm of head of pancreas: Secondary | ICD-10-CM

## 2022-01-01 DIAGNOSIS — M1711 Unilateral primary osteoarthritis, right knee: Secondary | ICD-10-CM | POA: Diagnosis present

## 2022-01-01 DIAGNOSIS — Z79899 Other long term (current) drug therapy: Secondary | ICD-10-CM

## 2022-01-01 DIAGNOSIS — E162 Hypoglycemia, unspecified: Secondary | ICD-10-CM

## 2022-01-01 DIAGNOSIS — A419 Sepsis, unspecified organism: Secondary | ICD-10-CM | POA: Diagnosis present

## 2022-01-01 DIAGNOSIS — Z880 Allergy status to penicillin: Secondary | ICD-10-CM

## 2022-01-01 DIAGNOSIS — E1165 Type 2 diabetes mellitus with hyperglycemia: Secondary | ICD-10-CM | POA: Diagnosis present

## 2022-01-01 DIAGNOSIS — Z8542 Personal history of malignant neoplasm of other parts of uterus: Secondary | ICD-10-CM

## 2022-01-01 DIAGNOSIS — Z8249 Family history of ischemic heart disease and other diseases of the circulatory system: Secondary | ICD-10-CM

## 2022-01-01 DIAGNOSIS — K219 Gastro-esophageal reflux disease without esophagitis: Secondary | ICD-10-CM | POA: Diagnosis present

## 2022-01-01 DIAGNOSIS — Z85038 Personal history of other malignant neoplasm of large intestine: Secondary | ICD-10-CM

## 2022-01-01 DIAGNOSIS — Z8507 Personal history of malignant neoplasm of pancreas: Secondary | ICD-10-CM

## 2022-01-01 DIAGNOSIS — E785 Hyperlipidemia, unspecified: Secondary | ICD-10-CM | POA: Diagnosis present

## 2022-01-01 DIAGNOSIS — N179 Acute kidney failure, unspecified: Secondary | ICD-10-CM | POA: Diagnosis present

## 2022-01-01 DIAGNOSIS — R6510 Systemic inflammatory response syndrome (SIRS) of non-infectious origin without acute organ dysfunction: Secondary | ICD-10-CM

## 2022-01-01 DIAGNOSIS — T80211A Bloodstream infection due to central venous catheter, initial encounter: Secondary | ICD-10-CM | POA: Diagnosis not present

## 2022-01-01 DIAGNOSIS — Z833 Family history of diabetes mellitus: Secondary | ICD-10-CM

## 2022-01-01 DIAGNOSIS — Y92009 Unspecified place in unspecified non-institutional (private) residence as the place of occurrence of the external cause: Secondary | ICD-10-CM

## 2022-01-01 DIAGNOSIS — R300 Dysuria: Secondary | ICD-10-CM

## 2022-01-01 DIAGNOSIS — N1831 Chronic kidney disease, stage 3a: Secondary | ICD-10-CM

## 2022-01-01 DIAGNOSIS — E1100 Type 2 diabetes mellitus with hyperosmolarity without nonketotic hyperglycemic-hyperosmolar coma (NKHHC): Secondary | ICD-10-CM | POA: Diagnosis not present

## 2022-01-01 DIAGNOSIS — Z7901 Long term (current) use of anticoagulants: Secondary | ICD-10-CM

## 2022-01-01 DIAGNOSIS — Z6841 Body Mass Index (BMI) 40.0 and over, adult: Secondary | ICD-10-CM

## 2022-01-01 DIAGNOSIS — W19XXXA Unspecified fall, initial encounter: Secondary | ICD-10-CM | POA: Diagnosis not present

## 2022-01-01 DIAGNOSIS — I442 Atrioventricular block, complete: Secondary | ICD-10-CM | POA: Diagnosis present

## 2022-01-01 DIAGNOSIS — C259 Malignant neoplasm of pancreas, unspecified: Secondary | ICD-10-CM

## 2022-01-01 DIAGNOSIS — E8809 Other disorders of plasma-protein metabolism, not elsewhere classified: Secondary | ICD-10-CM | POA: Diagnosis present

## 2022-01-01 DIAGNOSIS — I129 Hypertensive chronic kidney disease with stage 1 through stage 4 chronic kidney disease, or unspecified chronic kidney disease: Secondary | ICD-10-CM | POA: Diagnosis present

## 2022-01-01 DIAGNOSIS — T8131XA Disruption of external operation (surgical) wound, not elsewhere classified, initial encounter: Secondary | ICD-10-CM | POA: Diagnosis present

## 2022-01-01 DIAGNOSIS — Y713 Surgical instruments, materials and cardiovascular devices (including sutures) associated with adverse incidents: Secondary | ICD-10-CM | POA: Diagnosis present

## 2022-01-01 DIAGNOSIS — R6520 Severe sepsis without septic shock: Secondary | ICD-10-CM | POA: Diagnosis present

## 2022-01-01 DIAGNOSIS — E872 Acidosis, unspecified: Secondary | ICD-10-CM

## 2022-01-01 DIAGNOSIS — E11649 Type 2 diabetes mellitus with hypoglycemia without coma: Secondary | ICD-10-CM | POA: Diagnosis not present

## 2022-01-01 DIAGNOSIS — D630 Anemia in neoplastic disease: Secondary | ICD-10-CM | POA: Diagnosis present

## 2022-01-01 DIAGNOSIS — E860 Dehydration: Secondary | ICD-10-CM | POA: Diagnosis present

## 2022-01-01 DIAGNOSIS — K1379 Other lesions of oral mucosa: Secondary | ICD-10-CM

## 2022-01-01 DIAGNOSIS — N189 Chronic kidney disease, unspecified: Secondary | ICD-10-CM | POA: Diagnosis present

## 2022-01-01 DIAGNOSIS — Z20822 Contact with and (suspected) exposure to covid-19: Secondary | ICD-10-CM | POA: Diagnosis present

## 2022-01-01 DIAGNOSIS — N3000 Acute cystitis without hematuria: Secondary | ICD-10-CM

## 2022-01-01 DIAGNOSIS — Z794 Long term (current) use of insulin: Secondary | ICD-10-CM

## 2022-01-01 LAB — TROPONIN I (HIGH SENSITIVITY)
Troponin I (High Sensitivity): 15 ng/L (ref <18)
Troponin I (High Sensitivity): 13 ng/L (ref <18)

## 2022-01-01 LAB — COMPREHENSIVE METABOLIC PANEL
ALT: 11 U/L (ref 0–44)
AST: 23 U/L (ref 15–41)
Albumin: 1.9 g/dL — ABNORMAL LOW (ref 3.5–5.0)
Alkaline Phosphatase: 109 U/L (ref 38–126)
Anion gap: 11 (ref 5–15)
BUN: 16 mg/dL (ref 6–20)
CO2: 25 mmol/L (ref 22–32)
Calcium: 8.4 mg/dL — ABNORMAL LOW (ref 8.9–10.3)
Chloride: 103 mmol/L (ref 98–111)
Creatinine, Ser: 1.43 mg/dL — ABNORMAL HIGH (ref 0.44–1.00)
GFR, Estimated: 42 mL/min — ABNORMAL LOW (ref >60)
Glucose, Bld: 68 mg/dL — ABNORMAL LOW (ref 70–99)
Potassium: 4 mmol/L (ref 3.5–5.1)
Sodium: 139 mmol/L (ref 135–145)
Total Bilirubin: 0.5 mg/dL (ref 0.3–1.2)
Total Protein: 7.7 g/dL (ref 6.5–8.1)

## 2022-01-01 LAB — CBG MONITORING, ED
Glucose-Capillary: 35 mg/dL — CL (ref 70–99)
Glucose-Capillary: 88 mg/dL (ref 70–99)
Glucose-Capillary: 89 mg/dL (ref 70–99)
Glucose-Capillary: 88 mg/dL (ref 70–99)
Glucose-Capillary: 50 mg/dL — ABNORMAL LOW (ref 70–99)
Glucose-Capillary: 115 mg/dL — ABNORMAL HIGH (ref 70–99)
Glucose-Capillary: 98 mg/dL (ref 70–99)

## 2022-01-01 LAB — CBC WITH DIFFERENTIAL/PLATELET
Abs Immature Granulocytes: 0.13 10*3/uL — ABNORMAL HIGH (ref 0.00–0.07)
Basophils Absolute: 0 10*3/uL (ref 0.0–0.1)
Basophils Relative: 0 %
Eosinophils Absolute: 0 10*3/uL (ref 0.0–0.5)
Eosinophils Relative: 0 %
HCT: 38.8 % (ref 36.0–46.0)
Hemoglobin: 12 g/dL (ref 12.0–15.0)
Immature Granulocytes: 1 %
Lymphocytes Relative: 3 %
Lymphs Abs: 0.9 10*3/uL (ref 0.7–4.0)
MCH: 28.2 pg (ref 26.0–34.0)
MCHC: 30.9 g/dL (ref 30.0–36.0)
MCV: 91.1 fL (ref 80.0–100.0)
Monocytes Absolute: 0.1 10*3/uL (ref 0.1–1.0)
Monocytes Relative: 0 %
Neutro Abs: 23.8 10*3/uL — ABNORMAL HIGH (ref 1.7–7.7)
Neutrophils Relative %: 96 %
Platelets: 397 10*3/uL (ref 150–400)
RBC: 4.26 MIL/uL (ref 3.87–5.11)
RDW: 19.9 % — ABNORMAL HIGH (ref 11.5–15.5)
WBC: 24.9 10*3/uL — ABNORMAL HIGH (ref 4.0–10.5)
nRBC: 0 % (ref 0.0–0.2)

## 2022-01-01 LAB — TYPE AND SCREEN
ABO/RH(D): B POS
Antibody Screen: NEGATIVE

## 2022-01-01 LAB — URINALYSIS, ROUTINE W REFLEX MICROSCOPIC
Bilirubin Urine: NEGATIVE
Glucose, UA: 150 mg/dL — AB
Ketones, ur: NEGATIVE mg/dL
Nitrite: NEGATIVE
Protein, ur: 100 mg/dL — AB
Specific Gravity, Urine: 1.017 (ref 1.005–1.030)
pH: 5 (ref 5.0–8.0)

## 2022-01-01 LAB — GLUCOSE, CAPILLARY: Glucose-Capillary: 98 mg/dL (ref 70–99)

## 2022-01-01 LAB — RESP PANEL BY RT-PCR (FLU A&B, COVID) ARPGX2
Influenza A by PCR: NEGATIVE
Influenza B by PCR: NEGATIVE
SARS Coronavirus 2 by RT PCR: NEGATIVE

## 2022-01-01 LAB — APTT: aPTT: 28 seconds (ref 24–36)

## 2022-01-01 LAB — LIPASE, BLOOD: Lipase: 23 U/L (ref 11–51)

## 2022-01-01 LAB — PROTIME-INR
INR: 1.3 — ABNORMAL HIGH (ref 0.8–1.2)
Prothrombin Time: 15.8 seconds — ABNORMAL HIGH (ref 11.4–15.2)

## 2022-01-01 LAB — TSH: TSH: 1.701 u[IU]/mL (ref 0.350–4.500)

## 2022-01-01 LAB — LACTIC ACID, PLASMA
Lactic Acid, Venous: 2.2 mmol/L (ref 0.5–1.9)
Lactic Acid, Venous: 1.5 mmol/L (ref 0.5–1.9)

## 2022-01-01 MED ORDER — SODIUM CHLORIDE 0.9 % IV SOLN: INTRAVENOUS | Status: DC

## 2022-01-01 MED ORDER — DEXTROSE 50 % IV SOLN
1.0000 | Freq: Once | INTRAVENOUS | Status: AC
  Administered 2022-01-01: 50 mL via INTRAVENOUS
  Filled 2022-01-01: qty 50

## 2022-01-01 MED ORDER — SODIUM CHLORIDE 0.9% FLUSH
3.0000 mL | Freq: Two times a day (BID) | INTRAVENOUS | Status: DC
  Administered 2022-01-01 – 2022-01-05 (×4): 3 mL via INTRAVENOUS

## 2022-01-01 MED ORDER — OXYCODONE HCL 5 MG PO TABS
10.0000 mg | ORAL_TABLET | Freq: Four times a day (QID) | ORAL | Status: DC | PRN
  Administered 2022-01-01 – 2022-01-04 (×5): 10 mg via ORAL
  Filled 2022-01-01 (×5): qty 2

## 2022-01-01 MED ORDER — SODIUM CHLORIDE 0.9 % IV SOLN: 1.0000 g | Freq: Once | INTRAVENOUS | Status: DC

## 2022-01-01 MED ORDER — DEXTROSE 50 % IV SOLN
1.0000 | INTRAVENOUS | Status: DC | PRN
  Administered 2022-01-02: 50 mL via INTRAVENOUS
  Filled 2022-01-01: qty 50

## 2022-01-01 MED ORDER — DEXTROSE 5 % IV SOLN: INTRAVENOUS | Status: AC

## 2022-01-01 MED ORDER — ONDANSETRON HCL 4 MG PO TABS: 4.0000 mg | ORAL_TABLET | Freq: Four times a day (QID) | ORAL | Status: DC | PRN

## 2022-01-01 MED ORDER — GLUCERNA SHAKE PO LIQD
237.0000 mL | Freq: Three times a day (TID) | ORAL | Status: DC
  Administered 2022-01-01 – 2022-01-04 (×3): 237 mL via ORAL
  Filled 2022-01-01: qty 237

## 2022-01-01 MED ORDER — ONDANSETRON HCL 4 MG/2ML IJ SOLN
4.0000 mg | Freq: Four times a day (QID) | INTRAMUSCULAR | Status: DC | PRN
  Administered 2022-01-01 – 2022-01-04 (×5): 4 mg via INTRAVENOUS
  Filled 2022-01-01 (×5): qty 2

## 2022-01-01 MED ORDER — ALBUTEROL SULFATE (2.5 MG/3ML) 0.083% IN NEBU: 2.5000 mg | INHALATION_SOLUTION | Freq: Four times a day (QID) | RESPIRATORY_TRACT | Status: DC | PRN

## 2022-01-01 MED ORDER — ACETAMINOPHEN 325 MG PO TABS
650.0000 mg | ORAL_TABLET | Freq: Four times a day (QID) | ORAL | Status: DC | PRN
  Administered 2022-01-02: 650 mg via ORAL
  Filled 2022-01-01: qty 2

## 2022-01-01 MED ORDER — SODIUM CHLORIDE 0.9 % IV SOLN
1.0000 g | INTRAVENOUS | Status: DC
  Administered 2022-01-01 – 2022-01-05 (×5): 1 g via INTRAVENOUS
  Filled 2022-01-01 (×5): qty 10

## 2022-01-01 MED ORDER — ACETAMINOPHEN 650 MG RE SUPP: 650.0000 mg | Freq: Four times a day (QID) | RECTAL | Status: DC | PRN

## 2022-01-01 MED ORDER — APIXABAN 5 MG PO TABS
5.0000 mg | ORAL_TABLET | Freq: Two times a day (BID) | ORAL | Status: DC
  Administered 2022-01-01 – 2022-01-05 (×9): 5 mg via ORAL
  Filled 2022-01-01 (×9): qty 1

## 2022-01-01 MED ORDER — LIDOCAINE-PRILOCAINE 2.5-2.5 % EX CREA
1.0000 "application " | TOPICAL_CREAM | Freq: Every day | CUTANEOUS | Status: DC | PRN
  Filled 2022-01-01: qty 5

## 2022-01-01 MED ORDER — LORAZEPAM 1 MG PO TABS: 0.5000 mg | ORAL_TABLET | Freq: Two times a day (BID) | ORAL | Status: DC | PRN

## 2022-01-01 NOTE — Progress Notes (Signed)
Karey Suthers   DOB:05/29/1961   ZS#:[id number]   DDU#:[id number]  Oncology follow up note   Subjective: Pt is well-known to me, under my care for her metastatic pancreatic cancer.  She is on palliative chemotherapy.  She was recently hospitalized for severe anemia and weakness.  We restarted chemo with single agent 3 days ago, she again developed severe fatigue, low appetite, and near syncope episode at home, and was brought to ED.  Her port site was found to be open 3 days ago, she missed her appointment with IR for port removal yesterday.  She was found to be hyperglycemic in the ED and required IV glucose.  She is feeling better now, husband at bedside.  Objective:  Vitals:   01/01/22 1900 01/01/22 2000  BP: 134/78 122/72  Pulse: 89 94  Resp: 16 17  Temp: 98.8 F (37.1 C)   SpO2: 95% 95%    Body mass index is 45.61 kg/m. No intake or output data in the 24 hours ending 01/01/22 2017    Sclerae unicteric  Port site is covered, with soaked gauze   No peripheral adenopathy  Lungs clear -- no rales or rhonchi  Heart regular rate and rhythm  Abdomen soft  MSK no focal spinal tenderness, no peripheral edema  Neuro nonfocal   CBG (last 3)  Recent Labs    01/01/22 1059 01/01/22 1133 01/01/22 1931  GLUCAP 50* 115* 98     Labs:  Urine Studies No results for input(s): UHGB, CRYS in the last 72 hours.  Invalid input(s): UACOL, UAPR, USPG, UPH, UTP, UGL, UKET, UBIL, UNIT, UROB, ULEU, UEPI, UWBC, URBC, UBAC, CAST, UCOM, BILUA  Basic Metabolic Panel: Recent Labs  Lab 12/29/21 1055 01/01/22 0812  NA 137 139  K 4.5 4.0  CL 102 103  CO2 26 25  GLUCOSE 238* 68*  BUN 11 16  CREATININE 1.08* 1.43*  CALCIUM 8.1* 8.4*   GFR Estimated Creatinine Clearance: 62.1 mL/min (A) (by C-G formula based on SCr of 1.43 mg/dL (H)). Liver Function Tests: Recent Labs  Lab 12/29/21 1055 01/01/22 0812  AST 17 23  ALT 8 11  ALKPHOS 100 109  BILITOT 0.4 0.5  PROT 6.7 7.7  ALBUMIN 2.4*  1.9*   Recent Labs  Lab 01/01/22 0812  LIPASE 23   No results for input(s): AMMONIA in the last 168 hours. Coagulation profile Recent Labs  Lab 01/01/22 0812  INR 1.3*    CBC: Recent Labs  Lab 12/29/21 1055 01/01/22 0812  WBC 12.8* 24.9*  NEUTROABS 10.3* 23.8*  HGB 11.2* 12.0  HCT 35.4* 38.8  MCV 88.9 91.1  PLT 355 397   Cardiac Enzymes: No results for input(s): CKTOTAL, CKMB, CKMBINDEX, TROPONINI in the last 168 hours. BNP: Invalid input(s): POCBNP CBG: Recent Labs  Lab 01/01/22 0546 01/01/22 0637 01/01/22 1059 01/01/22 1133 01/01/22 1931  GLUCAP 89 88 50* 115* 98   D-Dimer No results for input(s): DDIMER in the last 72 hours. Hgb A1c No results for input(s): HGBA1C in the last 72 hours. Lipid Profile No results for input(s): CHOL, HDL, LDLCALC, TRIG, CHOLHDL, LDLDIRECT in the last 72 hours. Thyroid function studies Recent Labs    01/01/22 0914  TSH 1.701   Anemia work up No results for input(s): VITAMINB12, FOLATE, FERRITIN, TIBC, IRON, RETICCTPCT in the last 72 hours. Microbiology Recent Results (from the past 240 hour(s))  Resp Panel by RT-PCR (Flu A&B, Covid) Nasopharyngeal Swab     Status: None   Collection Time: 01/01/22  8:16 AM   Specimen: Nasopharyngeal Swab; Nasopharyngeal(NP) swabs in vial transport medium  Result Value Ref Range Status   SARS Coronavirus 2 by RT PCR NEGATIVE NEGATIVE Final    Comment: (NOTE) SARS-CoV-2 target nucleic acids are NOT DETECTED.  The SARS-CoV-2 RNA is generally detectable in upper respiratory specimens during the acute phase of infection. The lowest concentration of SARS-CoV-2 viral copies this assay can detect is 138 copies/mL. A negative result does not preclude SARS-Cov-2 infection and should not be used as the sole basis for treatment or other patient management decisions. A negative result may occur with  improper specimen collection/handling, submission of specimen other than nasopharyngeal swab,  presence of viral mutation(s) within the areas targeted by this assay, and inadequate number of viral copies(<138 copies/mL). A negative result must be combined with clinical observations, patient history, and epidemiological information. The expected result is Negative.  Fact Sheet for Patients:  EntrepreneurPulse.com.au  Fact Sheet for Healthcare Providers:  IncredibleEmployment.be  This test is no t yet approved or cleared by the [place] FDA and  has been authorized for detection and/or diagnosis of SARS-CoV-2 by FDA under an Emergency Use Authorization (EUA). This EUA will remain  in effect (meaning this test can be used) for the duration of the COVID-19 declaration under Section 564(b)(1) of the Act, 21 U.S.C.section 360bbb-3(b)(1), unless the authorization is terminated  or revoked sooner.       Influenza A by PCR NEGATIVE NEGATIVE Final   Influenza B by PCR NEGATIVE NEGATIVE Final    Comment: (NOTE) The Xpert Xpress SARS-CoV-2/FLU/RSV plus assay is intended as an aid in the diagnosis of influenza from Nasopharyngeal swab specimens and should not be used as a sole basis for treatment. Nasal washings and aspirates are unacceptable for Xpert Xpress SARS-CoV-2/FLU/RSV testing.  Fact Sheet for Patients: EntrepreneurPulse.com.au  Fact Sheet for Healthcare Providers: IncredibleEmployment.be  This test is not yet approved or cleared by the [place] FDA and has been authorized for detection and/or diagnosis of SARS-CoV-2 by FDA under an Emergency Use Authorization (EUA). This EUA will remain in effect (meaning this test can be used) for the duration of the COVID-19 declaration under Section 564(b)(1) of the Act, 21 U.S.C. section 360bbb-3(b)(1), unless the authorization is terminated or revoked.  Performed at [facility], [place] [address] [place], [place] [address]       Studies:   CT HEAD WO CONTRAST (5MM)  Result Date: 01/01/2022 CLINICAL DATA:  Nonspecific dizziness with slight headache. On Eliquis. EXAM: CT HEAD WITHOUT CONTRAST TECHNIQUE: Contiguous axial images were obtained from the base of the skull through the vertex without intravenous contrast. RADIATION DOSE REDUCTION: This exam was performed according to the departmental dose-optimization program which includes automated exposure control, adjustment of the mA and/or kV according to patient size and/or use of iterative reconstruction technique. COMPARISON:  Sixteen days ago FINDINGS: Brain: No evidence of acute infarction, hemorrhage, hydrocephalus, extra-axial collection or mass lesion/mass effect. Dilated perivascular space below the left putamen. Notable cerebellar volume loss for age. Vascular: No hyperdense vessel or unexpected calcification. Skull: Normal. Negative for fracture or focal lesion. Sinuses/Orbits: No acute finding. IMPRESSION: No acute or interval finding. Electronically Signed   By: Jorje Guild M.D.   On: 01/01/2022 06:11   DG Chest Portable 1 View  Result Date: 01/01/2022 CLINICAL DATA:  62 year old female with history of dizziness. EXAM: PORTABLE CHEST 1 VIEW COMPARISON:  Chest x-ray 06/15/2021. FINDINGS: Lung volumes are low. No consolidative airspace disease. No  pleural effusions. No pneumothorax. No pulmonary nodule or mass noted. Pulmonary vasculature and the cardiomediastinal silhouette are within normal limits. Left-sided pacemaker device in place with lead tips projecting over the expected location of the right atrium and right ventricle. Right internal jugular single-lumen power porta cath with tip terminating in the mid superior vena cava. IMPRESSION: 1. Low lung volumes without radiographic evidence of acute cardiopulmonary disease. Electronically Signed   By: Vinnie Langton M.D.   On: 01/01/2022 05:39    Assessment: 61 y.o. female   DM and Hypoglycemia  Syncope  Wound at port  site  Pancreatic cancer with liver metastasis, on chemotherapy PAF DM Obesity  CKD     Plan:  -lab reviewed, no anemia, WBC elevated, possible related to infection?  -port needs to be removed ASAP, I will place the order  -I will hold chemo, she is a poor candidate for chemo due to poor tolerance and low PS  -will request FO on her biopsy sample to see if targeted therapy is available -she has f/u with me in office on 2/3, will cancel her chemo appointment  -I will f/u as needed when she is in hospital, please call me if needed   Truitt Merle, MD 01/01/2022

## 2022-01-01 NOTE — H&P (Addendum)
History and Physical    Leslie Mullen YQM:[id number] DOB: [birth year] DOA: 01/01/2022  Referring MD/NP/PA: Marda Stalker, MD PCP: Sue Lush, PA-C  Patient coming from: Home via EMS  Chief Complaint: Fall  I have personally briefly reviewed patient's old medical records in [place]   HPI: Leslie Mullen is a 61 y.o. female with medical history significant of hypertension, complete heart block s/p Medtronic PPM, DM type II, metastatic pancreatic cancer, endometrial cancer and morbid obesity who presents after having a fall.   Patient had just recently been hospitalized from 1/10-1/14 after having symptomatic anemia thought likely multifactorial in the setting of chemotherapy/metastatic pancreatic cancer receiving 4 units of packed red blood cell.  After getting home she seemed to be doing okay.  She had chemotherapy infusion 3 days ago, and at that time her situation surrounding her port had been noted to have dehisced.  Plan was to have the port removed and placement of new access.  She denies having any redness or drainage from the site.  Yesterday, patient had slept all day and had woken up due to feeling hot.  She had gotten up to try and turn on the fan, and reported feeling dizzy prior to falling.  Denied sustaining any significant injury or loss of consciousness, but she was unable to get up and thereafter.  She admits that she may not have been keeping herself adequately hydrated.  Denies having any low blood sugars, but only recalls checking her blood sugar couple days ago.  She has not had any blood in her stools after restarting Eliquis 3 days ago, and her last bowel movement was 2 days ago.  She chronically has abdominal pain and has had these intermittent tremors.  Patient notes that she has had intermittent stinging with urination and her urine has become dark in color  ED Course: On admission into the emergency department patient was noted to be febrile, pulse 115 - 127,  respirations 16-24, and all other vital signs maintained.  Labs significant for WBC 24.9, BUN 16, creatinine 1.43, glucose initially noted to be as low as 35, albumin 1.9, high-sensitivity troponins negative x2, and lactic acid 2.2.  CT scan of the head showed no acute abnormality.  Chest x-ray noted low lung volumes without any acute abnormality.  Patient had been given 2 Amps of D50 and started on empiric antibiotics of Rocephin for concern for urinary tract infection.  Review of Systems  Constitutional:  Positive for malaise/fatigue. Negative for fever.  Respiratory:  Negative for shortness of breath.   Cardiovascular:  Negative for chest pain, palpitations and leg swelling.  Gastrointestinal:  Positive for abdominal pain.  Genitourinary:  Positive for dysuria. Negative for frequency.       Change in urine color  Musculoskeletal:  Positive for back pain and falls.  Neurological:  Positive for dizziness and tremors. Negative for loss of consciousness.  Psychiatric/Behavioral:  Negative for substance abuse.    Past Medical History:  Diagnosis Date   Abnormal uterine bleeding    Allergy    Anemia    Asthma    Cataract    BOTH EYES   Complete heart block ([place])    a. Medtronic PPM 10/2020.   Diabetes ([place])    type II    Diabetic neuropathy ([place])    Endometrial cancer ([place])    Family history of colon cancer    GERD (gastroesophageal reflux disease)    Hyperlipidemia    Hypertension    Morbid  obesity ([place])    Osteoarthritis    right knee    Pancreatitis    Pneumonia     Past Surgical History:  Procedure Laterality Date   CERVICAL BIOPSY     CESAREAN SECTION     x3   COLONOSCOPY WITH PROPOFOL N/A 06/29/2017   Procedure: COLONOSCOPY WITH PROPOFOL;  Surgeon: Irene Shipper, MD;  Location: [facility];  Service: Endoscopy;  Laterality: N/A;   CYSTECTOMY Right 2010   Cyst removed from right groin.    DILATION AND CURETTAGE OF UTERUS N/A 07/29/2021   Procedure: DILATATION AND  CURETTAGE OF UTERUS;  Surgeon: Everitt Amber, MD;  Location: [facility];  Service: Gynecology;  Laterality: N/A;   INTRAUTERINE DEVICE (IUD) INSERTION N/A 07/29/2021   Procedure: INTRAUTERINE DEVICE (IUD) INSERTION;  Surgeon: Everitt Amber, MD;  Location: [facility];  Service: Gynecology;  Laterality: N/A;  IUD FROM MAIN PHARMACY   IR IMAGING GUIDED PORT INSERTION  11/11/2021   PACEMAKER IMPLANT N/A 10/11/2020   Procedure: PACEMAKER IMPLANT;  Surgeon: Vickie Epley, MD;  Location: [facility];  Service: Cardiovascular;  Laterality: N/A;   TONSILLECTOMY       reports that she has quit smoking. Her smoking use included cigarettes. She has never used smokeless tobacco. She reports that she does not drink alcohol and does not use drugs.  Allergies  Allergen Reactions   Penicillins Shortness Of Breath, Itching and Swelling    Has patient had a PCN reaction causing immediate rash, facial/tongue/throat swelling, SOB or lightheadedness with hypotension: Yes Has patient had a PCN reaction causing severe rash involving mucus membranes or skin necrosis:No Has patient had a PCN reaction that required hospitalization: No Has patient had a PCN reaction occurring within the last 10 years: no If all of the above answers are "NO", then may proceed with Cephalosporin use.  *Per patient, tolerated Keflex in the past      Family History  Adopted: Yes  Problem Relation Age of Onset   Diabetes Father    Heart disease Father    Cancer Mother        colon   Colon cancer Neg Hx    Esophageal cancer Neg Hx    Rectal cancer Neg Hx    Stomach cancer Neg Hx     Prior to Admission medications   Medication Sig Start Date End Date Taking? Authorizing Provider  amLODipine (NORVASC) 10 MG tablet Take 1 tablet (10 mg total) by mouth at bedtime. 01/03/21   [name], MD  apixaban (ELIQUIS) 5 MG TABS tablet Take 1 tablet (5 mg total) by mouth 2 (two) times daily. 12/23/21   Little Ishikawa, MD   gabapentin (NEURONTIN) 300 MG capsule Take 1 capsule (300 mg total) by mouth 2 (two) times daily. 10/12/20   Evans Lance, MD  glucose blood test strip  07/24/15   [provider]  insulin degludec (TRESIBA FLEXTOUCH) 200 UNIT/ML FlexTouch Pen Inject 20 Units into the skin every evening. Start at 20 units daily-if you blood glucose levels are still on the higher side-slowly increase back to your usual regimen of 80 units daily. 10/09/21   Ghimire, Henreitta Leber, MD  Insulin Pen Needle 31G X 5 MM MISC  08/01/15   [provider]  lidocaine-prilocaine (EMLA) cream Apply to affected area once 10/14/21   Truitt Merle, MD  loratadine (CLARITIN) 10 MG tablet Take 10 mg by mouth daily as needed for allergies.    [provider]  LORazepam (ATIVAN) 0.5 MG tablet Take 1 tablet (0.5 mg total) by mouth 2 (two) times daily as needed (for anxiety, and/or chemo-related nausea/vomiting). 11/13/21   Alla Feeling, NP  magic mouthwash w/lidocaine SOLN Take 5 mLs by mouth 4 (four) times daily. 11/06/21   Alla Feeling, NP  mirtazapine (REMERON) 15 MG tablet Take 1 tablet (15 mg total) by mouth at bedtime. 12/11/21   Truitt Merle, MD  Multiple Vitamin (MULTIVITAMIN) capsule Take 1 capsule by mouth daily.    [provider]  omeprazole (PRILOSEC) 40 MG capsule Take 40 mg by mouth 2 (two) times daily. 09/05/21   [provider]  ondansetron (ZOFRAN) 8 MG tablet Take 1 tablet (8 mg total) by mouth 2 (two) times daily as needed (Nausea or vomiting). 11/13/21   Alla Feeling, NP  [facility] DELICA LANCETS 40N MISC  08/01/15   [provider]  Oxycodone HCl 10 MG TABS Take 1 tablet (10 mg total) by mouth every 6 (six) hours as needed. Patient taking differently: Take 10 mg by mouth every 6 (six) hours as needed (pain). 12/11/21   Truitt Merle, MD  polyethylene glycol (MIRALAX / GLYCOLAX) 17 g packet Take 17 g by mouth daily as needed for moderate constipation.    [provider]   pravastatin (PRAVACHOL) 40 MG tablet TAKE 1 TABLET(40 MG) BY MOUTH DAILY 07/23/20   [name], MD  prochlorperazine (COMPAZINE) 10 MG tablet TAKE 1 TABLET(10 MG) BY MOUTH EVERY 6 HOURS AS NEEDED FOR NAUSEA OR VOMITING 12/16/21   Alla Feeling, NP  SYNJARDY XR 12.04-999 MG TB24 Take 1 tablet by mouth 2 (two) times daily. 05/30/21   [provider]  trolamine salicylate (ASPERCREME) 10 % cream Apply 1 application topically 2 (two) times daily as needed for muscle pain.    [provider]    Physical Exam:  Constitutional: Middle-aged female who appears chronically ill, but currently not in any acute distress and able to follow Vitals:   01/01/22 0945 01/01/22 1000 01/01/22 1030 01/01/22 1100  BP: (!) 116/59 124/61 126/68 131/76  Pulse: 98 99 97 98  Resp: 17 16 19 18   Temp:      TempSrc:      SpO2: 95% 96% 95% 95%  Weight:      Height:       Eyes: PERRL, lids and conjunctivae normal ENMT: Mucous membranes are moist. Posterior pharynx clear of any exudate or lesions.  Neck: normal, supple, no masses, no thyromegaly Respiratory: Normal respiratory effort without significant wheezes or rhonchi appreciated.  O2 saturation currently maintained on room air. Cardiovascular: Regular rate and rhythm, no murmurs / rubs / gallops.  No significant lower extremity edema.  Port present of the upper right chest wall without signs of erythema appreciated Abdomen: no tenderness, no masses palpated.  Musculoskeletal: no clubbing / cyanosis. No joint deformity upper and lower extremities. Normal muscle tone.  Skin: no rashes, lesions, ulcers. No induration Neurologic: CN 2-12 grossly intact.  Able to move all extremity Psychiatric: Normal judgment and insight. Alert and oriented x 3. Normal mood.     Labs on Admission: I have personally reviewed following labs and imaging studies  CBC: Recent Labs  Lab 12/29/21 1055 01/01/22 0812  WBC 12.8* 24.9*  NEUTROABS 10.3* 23.8*   HGB 11.2* 12.0  HCT 35.4* 38.8  MCV 88.9 91.1  PLT 355 027   Basic Metabolic Panel: Recent Labs  Lab 12/29/21 1055 01/01/22 0812  NA 137  139  K 4.5 4.0  CL 102 103  CO2 26 25  GLUCOSE 238* 68*  BUN 11 16  CREATININE 1.08* 1.43*  CALCIUM 8.1* 8.4*   GFR: Estimated Creatinine Clearance: 62.1 mL/min (A) (by C-G formula based on SCr of 1.43 mg/dL (H)). Liver Function Tests: Recent Labs  Lab 12/29/21 1055 01/01/22 0812  AST 17 23  ALT 8 11  ALKPHOS 100 109  BILITOT 0.4 0.5  PROT 6.7 7.7  ALBUMIN 2.4* 1.9*   Recent Labs  Lab 01/01/22 0812  LIPASE 23   No results for input(s): AMMONIA in the last 168 hours. Coagulation Profile: Recent Labs  Lab 01/01/22 0812  INR 1.3*   Cardiac Enzymes: No results for input(s): CKTOTAL, CKMB, CKMBINDEX, TROPONINI in the last 168 hours. BNP (last 3 results) No results for input(s): PROBNP in the last 8760 hours. HbA1C: No results for input(s): HGBA1C in the last 72 hours. CBG: Recent Labs  Lab 01/01/22 0450 01/01/22 0524 01/01/22 0546 01/01/22 0637 01/01/22 1059  GLUCAP 35* 88 89 88 50*   Lipid Profile: No results for input(s): CHOL, HDL, LDLCALC, TRIG, CHOLHDL, LDLDIRECT in the last 72 hours. Thyroid Function Tests: Recent Labs    01/01/22 0914  TSH 1.701   Anemia Panel: No results for input(s): VITAMINB12, FOLATE, FERRITIN, TIBC, IRON, RETICCTPCT in the last 72 hours. Urine analysis:    Component Value Date/Time   COLORURINE AMBER (A) 01/01/2022 0823   APPEARANCEUR HAZY (A) 01/01/2022 0823   LABSPEC 1.017 01/01/2022 0823   PHURINE 5.0 01/01/2022 0823   GLUCOSEU 150 (A) 01/01/2022 0823   HGBUR SMALL (A) 01/01/2022 0823   BILIRUBINUR NEGATIVE 01/01/2022 0823   KETONESUR NEGATIVE 01/01/2022 0823   PROTEINUR 100 (A) 01/01/2022 0823   NITRITE NEGATIVE 01/01/2022 0823   LEUKOCYTESUR TRACE (A) 01/01/2022 0823   Sepsis Labs: Recent Results (from the past 240 hour(s))  Resp Panel by RT-PCR (Flu A&B, Covid)  Nasopharyngeal Swab     Status: None   Collection Time: 01/01/22  8:16 AM   Specimen: Nasopharyngeal Swab; Nasopharyngeal(NP) swabs in vial transport medium  Result Value Ref Range Status   SARS Coronavirus 2 by RT PCR NEGATIVE NEGATIVE Final    Comment: (NOTE) SARS-CoV-2 target nucleic acids are NOT DETECTED.  The SARS-CoV-2 RNA is generally detectable in upper respiratory specimens during the acute phase of infection. The lowest concentration of SARS-CoV-2 viral copies this assay can detect is 138 copies/mL. A negative result does not preclude SARS-Cov-2 infection and should not be used as the sole basis for treatment or other patient management decisions. A negative result may occur with  improper specimen collection/handling, submission of specimen other than nasopharyngeal swab, presence of viral mutation(s) within the areas targeted by this assay, and inadequate number of viral copies(<138 copies/mL). A negative result must be combined with clinical observations, patient history, and epidemiological information. The expected result is Negative.  Fact Sheet for Patients:  EntrepreneurPulse.com.au  Fact Sheet for Healthcare Providers:  IncredibleEmployment.be  This test is no t yet approved or cleared by the [place] FDA and  has been authorized for detection and/or diagnosis of SARS-CoV-2 by FDA under an Emergency Use Authorization (EUA). This EUA will remain  in effect (meaning this test can be used) for the duration of the COVID-19 declaration under Section 564(b)(1) of the Act, 21 U.S.C.section 360bbb-3(b)(1), unless the authorization is terminated  or revoked sooner.       Influenza A by PCR NEGATIVE NEGATIVE Final   Influenza  B by PCR NEGATIVE NEGATIVE Final    Comment: (NOTE) The Xpert Xpress SARS-CoV-2/FLU/RSV plus assay is intended as an aid in the diagnosis of influenza from Nasopharyngeal swab specimens and should not be  used as a sole basis for treatment. Nasal washings and aspirates are unacceptable for Xpert Xpress SARS-CoV-2/FLU/RSV testing.  Fact Sheet for Patients: EntrepreneurPulse.com.au  Fact Sheet for Healthcare Providers: IncredibleEmployment.be  This test is not yet approved or cleared by the [place] FDA and has been authorized for detection and/or diagnosis of SARS-CoV-2 by FDA under an Emergency Use Authorization (EUA). This EUA will remain in effect (meaning this test can be used) for the duration of the COVID-19 declaration under Section 564(b)(1) of the Act, 21 U.S.C. section 360bbb-3(b)(1), unless the authorization is terminated or revoked.  Performed at [facility], [place] [address] [place], [place] [address]      Radiological Exams on Admission: CT HEAD WO CONTRAST (5MM)  Result Date: 01/01/2022 CLINICAL DATA:  Nonspecific dizziness with slight headache. On Eliquis. EXAM: CT HEAD WITHOUT CONTRAST TECHNIQUE: Contiguous axial images were obtained from the base of the skull through the vertex without intravenous contrast. RADIATION DOSE REDUCTION: This exam was performed according to the departmental dose-optimization program which includes automated exposure control, adjustment of the mA and/or kV according to patient size and/or use of iterative reconstruction technique. COMPARISON:  Sixteen days ago FINDINGS: Brain: No evidence of acute infarction, hemorrhage, hydrocephalus, extra-axial collection or mass lesion/mass effect. Dilated perivascular space below the left putamen. Notable cerebellar volume loss for age. Vascular: No hyperdense vessel or unexpected calcification. Skull: Normal. Negative for fracture or focal lesion. Sinuses/Orbits: No acute finding. IMPRESSION: No acute or interval finding. Electronically Signed   By: Jorje Guild M.D.   On: 01/01/2022 06:11   DG Chest Portable 1 View  Result Date: 01/01/2022 CLINICAL DATA:   61 year old female with history of dizziness. EXAM: PORTABLE CHEST 1 VIEW COMPARISON:  Chest x-ray 06/15/2021. FINDINGS: Lung volumes are low. No consolidative airspace disease. No pleural effusions. No pneumothorax. No pulmonary nodule or mass noted. Pulmonary vasculature and the cardiomediastinal silhouette are within normal limits. Left-sided pacemaker device in place with lead tips projecting over the expected location of the right atrium and right ventricle. Right internal jugular single-lumen power porta cath with tip terminating in the mid superior vena cava. IMPRESSION: 1. Low lung volumes without radiographic evidence of acute cardiopulmonary disease. Electronically Signed   By: Vinnie Langton M.D.   On: 01/01/2022 05:39    EKG: Independently reviewed.  Sinus tachycardia 128 bpm  Assessment/Plan Fall secondary to dizziness: Patient presents after getting up and feeling dizzy as though she passed out while reaching for the pain prior to falling.  Denies having any chest pain or palpitations.  High-sensitivity troponins are negative x2. -Admit to a medical telemetry bed -Check orthostatic vital sign -Physical therapy to eval and treat  Diabetes mellitus type 2 with hypoglycemia: On admission glucose was noted to be as low as 35.  Patient had been given 2 A of D50 in the ED. Last hemoglobin A1c was 8.8 10/05/2021.  Home medication regimen includes Synjardy 12.04-999 mg twice daily and Tresiba 80 units daily. -Hypoglycemic protocols -Hold home insulin regimen -On D5 at 100 mL/h x 1 L  SIRS Lactic acidosis: Acute.  On admission patient was noted to be tachycardic and tachypneic with WBC elevated 24.9 lactic acid elevated 2.2.  Given patient's immunocompromise status question possibility of infection(would suspect more likely in her  port with wound dehiscence more so than UTI although no signs of erythema noted around the port), but symptoms also could be related with patient being acutely  dehydrated. -Follow-up urine and blood culture -Serial monitoring of lactic acid level -Recheck CBC with differential in a.m.  Possible urinary tract infection: Urinalysis noted small hemoglobin, trace leukocytes, and rare bacteria.  She had been initially started on empiric antibiotics of Rocephin although urinalysis was not highly suggestive of a UTI, but patient reported dark urine color and intermittent stinging sensation  Acute kidney injury: Creatinine elevated at 1.43 with previous baseline 3 days ago 1.08.  Patient admits possible decreased p.o. intake.-Continue IV fluids -Recheck kidney function in a.m.  Metastatic pancreatic cancer: Patient diagnosed less than 6 months ago.  Follows with Dr. Burr Medico as an outpatient currently on palliative chemotherapy.  Her last chemotherapy session was 3 days ago.  Noted to have suture dehiscence of the port during last evaluation on 1/23 for which replacement was recommended. -Continue oxycodone as needed -Follow-up with IR in the outpatient setting to have port replaced due to suture dehiscence  Paroxysmal atrial fibrillation on chronic anticoagulation: Patient's Eliquis had been on hold due to symptomatic anemia.  Hemoglobin appears improved after recent admission with concern for symptomatic anemia and required transfusion of 4 units of blood. -Continue Eliquis as tolerated  Complete heart block: s/p Medtronic PPM -Follow-up pacemaker interrogation  History of GI bleed: Hemoglobin had dropped as low as 6.6 g/dL and patient had received 4 units of packed red blood cells during last hospitalization.  Essential hypertension: Home blood pressure medications include amlodipine 10 mg daily. -Hold amlodipine   Anxiety -Continue lorazepam as needed  Hyperlipidemia -Continue pravastatin as needed  Hypoalbuminemia: Acute on chronic.  On admission albumin 1.9. -Check prealbumin in a.m. -Glucerna shakes in between meals  DVT prophylaxis:  Eliquis Code Status: Full Family Communication: Husband updated at bedside Disposition Plan: Likely home once medically stable Consults called: None Admission status: Observation  Norval Morton MD [facility]   If 7PM-7AM, please contact night-coverage   01/01/2022, 11:30 AM

## 2022-01-01 NOTE — ED Triage Notes (Signed)
Pt comes from home via [facility] EMS, pt c/o of dizziness, had a fall tonight, did not hit head or any injury, was to weak to get up, pt is actively getting chemo, has a pacemaker, recently had GI bleed as well.

## 2022-01-01 NOTE — ED Provider Triage Note (Addendum)
Emergency Medicine Provider Triage Evaluation Note  Leslie Mullen , a 61 y.o. female  was evaluated in triage.  Pt complains of dizziness.  States it feels like slight spins with mid headache.  Overall feels weak.  Has hx of metastatic pancreatic cancer, currently getting chemo.  Under care of Dr. Grant Ruts.  Admitted earlier this month with anemia and occult GI bleeding.  She remains on eliquis.  Denies any blood in stools recently.  CBG with EMS was 54, given apple juice at home.  Review of Systems  Positive: Dizzy, weak Negative: fever  Physical Exam  BP 132/81 (BP Location: Left Arm)    Pulse (!) 127    Temp 97.6 F (36.4 C) (Oral)    Resp 16    Ht 5\' 8"  (1.727 m)    Wt 136.1 kg    SpO2 92%    BMI 45.61 kg/m   Gen:   Awake, no distress   Resp:  Normal effort  MSK:   Moves extremities without difficulty  Other:  Tremulous during exam but remains AAOx3 and talkative, no focal deficits, no seizure activity observed  Medical Decision Making  Medically screening exam initiated at 4:48 AM.  Appropriate orders placed.  Sakeena Teall was informed that the remainder of the evaluation will be completed by another provider, this initial triage assessment does not replace that evaluation, and the importance of remaining in the ED until their evaluation is complete.   Dizziness and generalized weakness with recent admission for GI bleeding.  Remains on eliquis given hx of pancreatic cancer.  CBG 35 in triage.  Remains talkative, no change in mental status observed.  PIV inserted, will give amp D50.  Made acuity 2, will prioritize room assignment.  Charge RN made aware of patient's condition.  6:39 AM CBG has improved after D50, patient remains AAOx3, less tremulous now. Still awaiting acute bed.   Larene Pickett, PA-C 01/01/22 [place], [place], PA-C 01/01/22 [id number]

## 2022-01-01 NOTE — Progress Notes (Addendum)
Inpatient Diabetes Program Recommendations  AACE/ADA: New Consensus Statement on Inpatient Glycemic Control (2015)  Target Ranges:  Prepandial:   less than 140 mg/dL      Peak postprandial:   less than 180 mg/dL (1-2 hours)      Critically ill patients:  140 - 180 mg/dL   Lab Results  Component Value Date   GLUCAP 115 (H) 01/01/2022   HGBA1C 8.6 (H) 10/05/2021    Review of Glycemic Control  Diabetes history: type 2 Outpatient Diabetes medications: Tresiba 20 units in evening (up to 80 units if needed), Synjardy 12.5 mg-1000 mg BID Current orders for Inpatient glycemic control: none  Inpatient Diabetes Program Recommendations:   Received diabetes coordinator consult for recommendations for adjusting insulin regimen.  Noted that patient has been on [place] at home. GFR is 42, lactic acid is 2.2. Recommend starting Novolog SENSITIVE correction scale TID if blood sugars steadily start running >180 mg/dl while in the hospital, until her needs for insulin can be assessed.  Will continue to monitor blood sugars while in the hospital.  Harvel Ricks RN BSN CDE Diabetes Coordinator Pager: [phone]  8am-5pm

## 2022-01-01 NOTE — Evaluation (Signed)
Physical Therapy Evaluation Patient Details Name: Leslie Mullen MRN: [id number] DOB: 10-23-1961 Today's Date: 01/01/2022  History of Present Illness  61 y.o. female presents to [facility] hospital on 01/01/2022 after falling. Pt with recent admission 1/10-1/14 with symptomatic anemia. Pt admitted for management of SIRS. PMH includes HTN, CHB s/p PPM, DMII, metastatic pancreatic CA, endometrial CA.  Clinical Impression  Pt presents to PT with deficits in activity tolerance, gait, endurance, and with orthostatic hypotension. Pt with significant drop in systolic BP with standing this session, resulting in symptoms of dizziness. PT provides education on methods to reduce the risk of orthostatic hypotension including taking time between positional changes, LE exercise prior to standing, staying adequately hydrated, etc. Pt will benefit from continued acute PT services to aide in improving activity tolerance.       Recommendations for follow up therapy are one component of a multi-disciplinary discharge planning process, led by the attending physician.  Recommendations may be updated based on patient status, additional functional criteria and insurance authorization.  Follow Up Recommendations Home health PT (to improve activity tolerance)    Assistance Recommended at Discharge Intermittent Supervision/Assistance  Patient can return home with the following  A little help with walking and/or transfers;A little help with bathing/dressing/bathroom;Assist for transportation;Assistance with cooking/housework    Equipment Recommendations  (pt reports a walker is to be delivered to her home this weekend)  Recommendations for Other Services       Functional Status Assessment Patient has had a recent decline in their functional status and demonstrates the ability to make significant improvements in function in a reasonable and predictable amount of time.     Precautions / Restrictions Precautions Precautions:  Fall Precaution Comments: orthostatic hypotension Restrictions Weight Bearing Restrictions: No      Mobility  Bed Mobility Overal bed mobility: Needs Assistance Bed Mobility: Supine to Sit, Sit to Supine     Supine to sit: Min guard Sit to supine: Mod assist   General bed mobility comments: spouse assists with elevating LEs onto bed    Transfers Overall transfer level: Needs assistance Equipment used: Rolling walker (2 wheels) Transfers: Sit to/from Stand Sit to Stand: Min guard                Ambulation/Gait Ambulation/Gait assistance: Min guard Gait Distance (Feet): 2 Feet Assistive device: Rolling walker (2 wheels) Gait Pattern/deviations: Step-to pattern Gait velocity: reduced Gait velocity interpretation: <1.31 ft/sec, indicative of household ambulator   General Gait Details: pt marches at edge of bed for ~12 steps, pt then laterally steps toward head of bed  Stairs            Wheelchair Mobility    Modified Rankin (Stroke Patients Only)       Balance Overall balance assessment: Needs assistance Sitting-balance support: No upper extremity supported, Feet supported Sitting balance-Leahy Scale: Fair     Standing balance support: Bilateral upper extremity supported, Reliant on assistive device for balance Standing balance-Leahy Scale: Poor                               Pertinent Vitals/Pain Pain Assessment Pain Assessment: 0-10 Pain Score: 7  Pain Location: abdomen Pain Descriptors / Indicators: Aching Pain Intervention(s): Monitored during session    Home Living Family/patient expects to be discharged to:: Private residence Living Arrangements: Spouse/significant other Available Help at Discharge: Family;Available 24 hours/day Type of Home: Apartment Home Access: Level entry  Home Layout: One level Home Equipment: Prosser Walker (2 wheels)      Prior Function Prior Level of Function : Needs  assist       Physical Assist : ADLs (physical)   ADLs (physical): Bathing Mobility Comments: pt reports a gradual decline in ambulation tolerance since beginning chemo. Pt is able to ambulate household distances with quad cane, utilizes a transport chair for community level mobility. 3 falls since november, all related to dizziness       Hand Dominance        Extremity/Trunk Assessment   Upper Extremity Assessment Upper Extremity Assessment: Overall WFL for tasks assessed    Lower Extremity Assessment Lower Extremity Assessment: Overall WFL for tasks assessed    Cervical / Trunk Assessment Cervical / Trunk Assessment: Other exceptions (excess body habitus)  Communication   Communication: No difficulties  Cognition Arousal/Alertness: Awake/alert Behavior During Therapy: WFL for tasks assessed/performed Overall Cognitive Status: Within Functional Limits for tasks assessed                                          General Comments General comments (skin integrity, edema, etc.): orthostatic vitals: supine 140/76, HR 101. Sitting 146/88, HR 118. Standing 112/88, HR 122, pt symptomatic for dizziness. Pt unable to stand for 3 min due to dizziness. BP at end of session 124/68, HR 115. Pt with peak HR observed at 141 during marching.    Exercises     Assessment/Plan    PT Assessment Patient needs continued PT services  PT Problem List Decreased activity tolerance;Decreased mobility;Cardiopulmonary status limiting activity       PT Treatment Interventions DME instruction;Gait training;Functional mobility training;Therapeutic activities;Therapeutic exercise;Balance training;Patient/family education    PT Goals (Current goals can be found in the Care Plan section)  Acute Rehab PT Goals Patient Stated Goal: to improve activity tolerance and stop getting dizzy PT Goal Formulation: With patient/family Time For Goal Achievement: 01/15/22 Potential to Achieve  Goals: Fair    Frequency Min 3X/week     Co-evaluation               AM-PAC PT "6 Clicks" Mobility  Outcome Measure Help needed turning from your back to your side while in a flat bed without using bedrails?: A Little Help needed moving from lying on your back to sitting on the side of a flat bed without using bedrails?: A Little Help needed moving to and from a bed to a chair (including a wheelchair)?: A Little Help needed standing up from a chair using your arms (e.g., wheelchair or bedside chair)?: A Little Help needed to walk in hospital room?: A Little Help needed climbing 3-5 steps with a railing? : Total 6 Click Score: 16    End of Session   Activity Tolerance: Patient limited by fatigue Patient left: in bed;with call bell/phone within reach;with family/visitor present Nurse Communication: Mobility status PT Visit Diagnosis: Other abnormalities of gait and mobility (R26.89)    Time: 7353-2992 PT Time Calculation (min) (ACUTE ONLY): 22 min   Charges:   PT Evaluation $PT Eval Low Complexity: [place], PT, DPT Acute Rehabilitation Pager: Ramos   Zenaida Niece 01/01/2022, 4:37 PM

## 2022-01-01 NOTE — ED Notes (Signed)
Lunch tray ordered for patient.

## 2022-01-01 NOTE — ED Provider Notes (Addendum)
[facility] EMERGENCY DEPARTMENT Provider Note   CSN: [id number] Arrival date & time: 01/01/22  0444     History  Chief Complaint  Patient presents with   Dizziness    Leslie Mullen is a 61 y.o. female.  The history is provided by the patient, a relative and medical records. No language interpreter was used.  Near Syncope This is a recurrent problem. The current episode started 3 to 5 hours ago. The problem occurs rarely. The problem has been gradually improving. Associated symptoms include abdominal pain (chronic). Pertinent negatives include no chest pain, no headaches and no shortness of breath. The symptoms are aggravated by standing. Nothing relieves the symptoms. She has tried nothing for the symptoms. The treatment provided no relief.      Home Medications Prior to Admission medications   Medication Sig Start Date End Date Taking? Authorizing Provider  amLODipine (NORVASC) 10 MG tablet Take 1 tablet (10 mg total) by mouth at bedtime. 01/03/21   [name], MD  apixaban (ELIQUIS) 5 MG TABS tablet Take 1 tablet (5 mg total) by mouth 2 (two) times daily. 12/23/21   Little Ishikawa, MD  gabapentin (NEURONTIN) 300 MG capsule Take 1 capsule (300 mg total) by mouth 2 (two) times daily. 10/12/20   Evans Lance, MD  glucose blood test strip  07/24/15   [provider]  insulin degludec (TRESIBA FLEXTOUCH) 200 UNIT/ML FlexTouch Pen Inject 20 Units into the skin every evening. Start at 20 units daily-if you blood glucose levels are still on the higher side-slowly increase back to your usual regimen of 80 units daily. 10/09/21   Ghimire, Henreitta Leber, MD  Insulin Pen Needle 31G X 5 MM MISC  08/01/15   [provider]  lidocaine-prilocaine (EMLA) cream Apply to affected area once 10/14/21   Truitt Merle, MD  loratadine (CLARITIN) 10 MG tablet Take 10 mg by mouth daily as needed for allergies.    [provider]  LORazepam (ATIVAN) 0.5 MG  tablet Take 1 tablet (0.5 mg total) by mouth 2 (two) times daily as needed (for anxiety, and/or chemo-related nausea/vomiting). 11/13/21   Alla Feeling, NP  magic mouthwash w/lidocaine SOLN Take 5 mLs by mouth 4 (four) times daily. 11/06/21   Alla Feeling, NP  mirtazapine (REMERON) 15 MG tablet Take 1 tablet (15 mg total) by mouth at bedtime. 12/11/21   Truitt Merle, MD  Multiple Vitamin (MULTIVITAMIN) capsule Take 1 capsule by mouth daily.    [provider]  omeprazole (PRILOSEC) 40 MG capsule Take 40 mg by mouth 2 (two) times daily. 09/05/21   [provider]  ondansetron (ZOFRAN) 8 MG tablet Take 1 tablet (8 mg total) by mouth 2 (two) times daily as needed (Nausea or vomiting). 11/13/21   Alla Feeling, NP  [facility] DELICA LANCETS 75F MISC  08/01/15   [provider]  Oxycodone HCl 10 MG TABS Take 1 tablet (10 mg total) by mouth every 6 (six) hours as needed. Patient taking differently: Take 10 mg by mouth every 6 (six) hours as needed (pain). 12/11/21   Truitt Merle, MD  polyethylene glycol (MIRALAX / GLYCOLAX) 17 g packet Take 17 g by mouth daily as needed for moderate constipation.    [provider]  pravastatin (PRAVACHOL) 40 MG tablet TAKE 1 TABLET(40 MG) BY MOUTH DAILY 07/23/20   [name], MD  prochlorperazine (COMPAZINE) 10 MG tablet TAKE 1 TABLET(10 MG) BY MOUTH EVERY 6 HOURS AS  NEEDED FOR NAUSEA OR VOMITING 12/16/21   Alla Feeling, NP  SYNJARDY XR 12.04-999 MG TB24 Take 1 tablet by mouth 2 (two) times daily. 05/30/21   [provider]  trolamine salicylate (ASPERCREME) 10 % cream Apply 1 application topically 2 (two) times daily as needed for muscle pain.    [provider]      Allergies    Penicillins    Review of Systems   Review of Systems  Constitutional:  Positive for chills and fatigue. Negative for fever.  HENT:  Negative for congestion.   Eyes:  Negative for visual disturbance.  Respiratory:  Negative for cough,  chest tightness, shortness of breath and wheezing.   Cardiovascular:  Positive for near-syncope. Negative for chest pain, palpitations and leg swelling.  Gastrointestinal:  Positive for abdominal pain (chronic), blood in stool (dark stools again) and nausea. Negative for diarrhea and vomiting.  Genitourinary:  Positive for dysuria. Negative for flank pain.  Musculoskeletal:  Negative for back pain, neck pain and neck stiffness.  Skin:  Negative for rash and wound.  Neurological:  Positive for dizziness, syncope (and near syncope) and light-headedness. Negative for numbness and headaches.  Psychiatric/Behavioral:  Negative for agitation and confusion.   All other systems reviewed and are negative.  Physical Exam Updated Vital Signs BP 121/65    Pulse (!) 115    Temp 97.6 F (36.4 C) (Oral)    Resp 18    Ht 5\' 8"  (1.727 m)    Wt 136.1 kg    SpO2 97%    BMI 45.61 kg/m  Physical Exam Vitals and nursing note reviewed.  Constitutional:      General: She is not in acute distress.    Appearance: She is well-developed. She is not ill-appearing, toxic-appearing or diaphoretic.  HENT:     Head: Normocephalic and atraumatic.     Nose: No congestion or rhinorrhea.     Mouth/Throat:     Mouth: Mucous membranes are moist.  Eyes:     Extraocular Movements: Extraocular movements intact.     Conjunctiva/sclera: Conjunctivae normal.     Pupils: Pupils are equal, round, and reactive to light.  Cardiovascular:     Rate and Rhythm: Regular rhythm. Tachycardia present.     Heart sounds: No murmur heard. Pulmonary:     Effort: Pulmonary effort is normal. No respiratory distress.     Breath sounds: Normal breath sounds. No wheezing, rhonchi or rales.  Chest:     Chest wall: No tenderness.  Abdominal:     General: Abdomen is flat.     Palpations: Abdomen is soft.     Tenderness: There is no abdominal tenderness. There is no right CVA tenderness, left CVA tenderness, guarding or rebound.   Musculoskeletal:        General: No swelling or tenderness.     Cervical back: Neck supple. No tenderness.  Skin:    General: Skin is warm and dry.     Capillary Refill: Capillary refill takes less than 2 seconds.     Coloration: Skin is pale.     Findings: No erythema or rash.  Neurological:     General: No focal deficit present.     Mental Status: She is alert.     Sensory: No sensory deficit.     Motor: No weakness.  Psychiatric:        Mood and Affect: Mood normal.    ED Results / Procedures / Treatments   Labs (all labs  ordered are listed, but only abnormal results are displayed) Labs Reviewed  CBC WITH DIFFERENTIAL/PLATELET - Abnormal; Notable for the following components:      Result Value   WBC 24.9 (*)    RDW 19.9 (*)    Neutro Abs 23.8 (*)    Abs Immature Granulocytes 0.13 (*)    All other components within normal limits  COMPREHENSIVE METABOLIC PANEL - Abnormal; Notable for the following components:   Glucose, Bld 68 (*)    Creatinine, Ser 1.43 (*)    Calcium 8.4 (*)    Albumin 1.9 (*)    GFR, Estimated 42 (*)    All other components within normal limits  PROTIME-INR - Abnormal; Notable for the following components:   Prothrombin Time 15.8 (*)    INR 1.3 (*)    All other components within normal limits  URINALYSIS, ROUTINE W REFLEX MICROSCOPIC - Abnormal; Notable for the following components:   Color, Urine AMBER (*)    APPearance HAZY (*)    Glucose, UA 150 (*)    Hgb urine dipstick SMALL (*)    Protein, ur 100 (*)    Leukocytes,Ua TRACE (*)    Bacteria, UA RARE (*)    All other components within normal limits  LACTIC ACID, PLASMA - Abnormal; Notable for the following components:   Lactic Acid, Venous 2.2 (*)    All other components within normal limits  CBG MONITORING, ED - Abnormal; Notable for the following components:   Glucose-Capillary 35 (*)    All other components within normal limits  CBG MONITORING, ED - Abnormal; Notable for the following  components:   Glucose-Capillary 50 (*)    All other components within normal limits  RESP PANEL BY RT-PCR (FLU A&B, COVID) [id number]  URINE CULTURE  LIPASE, BLOOD  APTT  TSH  LACTIC ACID, PLASMA  CBG MONITORING, ED  CBG MONITORING, ED  CBG MONITORING, ED  TYPE AND SCREEN  TROPONIN I (HIGH SENSITIVITY)  TROPONIN I (HIGH SENSITIVITY)    EKG EKG Interpretation  Date/Time:  Thursday January 01 2022 04:47:08 EST Ventricular Rate:  128 PR Interval:  162 QRS Duration: 72 QT Interval:  292 QTC Calculation: 426 R Axis:   60 Text Interpretation: Sinus tachycardia Anterior infarct , age undetermined Abnormal ECG when compared to prior, more artifact. No STEMI Confirmed by Antony Blackbird [phone]) on 01/01/2022 7:25:40 AM  Radiology CT HEAD WO CONTRAST (5MM)  Result Date: 01/01/2022 CLINICAL DATA:  Nonspecific dizziness with slight headache. On Eliquis. EXAM: CT HEAD WITHOUT CONTRAST TECHNIQUE: Contiguous axial images were obtained from the base of the skull through the vertex without intravenous contrast. RADIATION DOSE REDUCTION: This exam was performed according to the departmental dose-optimization program which includes automated exposure control, adjustment of the mA and/or kV according to patient size and/or use of iterative reconstruction technique. COMPARISON:  Sixteen days ago FINDINGS: Brain: No evidence of acute infarction, hemorrhage, hydrocephalus, extra-axial collection or mass lesion/mass effect. Dilated perivascular space below the left putamen. Notable cerebellar volume loss for age. Vascular: No hyperdense vessel or unexpected calcification. Skull: Normal. Negative for fracture or focal lesion. Sinuses/Orbits: No acute finding. IMPRESSION: No acute or interval finding. Electronically Signed   By: Jorje Guild M.D.   On: 01/01/2022 06:11   DG Chest Portable 1 View  Result Date: 01/01/2022 CLINICAL DATA:  61 year old female with history of dizziness. EXAM: PORTABLE CHEST 1 VIEW  COMPARISON:  Chest x-ray 06/15/2021. FINDINGS: Lung volumes are low. No consolidative airspace disease. No pleural  effusions. No pneumothorax. No pulmonary nodule or mass noted. Pulmonary vasculature and the cardiomediastinal silhouette are within normal limits. Left-sided pacemaker device in place with lead tips projecting over the expected location of the right atrium and right ventricle. Right internal jugular single-lumen power porta cath with tip terminating in the mid superior vena cava. IMPRESSION: 1. Low lung volumes without radiographic evidence of acute cardiopulmonary disease. Electronically Signed   By: Vinnie Langton M.D.   On: 01/01/2022 05:39    Procedures Procedures    CRITICAL CARE Performed by: Gwenyth Allegra Brayley Mackowiak Total critical care time: 35 minutes Critical care time was exclusive of separately billable procedures and treating other patients. Critical care was necessary to treat or prevent imminent or life-threatening deterioration. Critical care was time spent personally by me on the following activities: development of treatment plan with patient and/or surrogate as well as nursing, discussions with consultants, evaluation of patient's response to treatment, examination of patient, obtaining history from patient or surrogate, ordering and performing treatments and interventions, ordering and review of laboratory studies, ordering and review of radiographic studies, pulse oximetry and re-evaluation of patient's condition.   Medications Ordered in ED Medications  dextrose 50 % solution 50 mL (50 mLs Intravenous Given 01/01/22 0503)  dextrose 50 % solution 50 mL (50 mLs Intravenous Given 01/01/22 1103)    ED Course/ Medical Decision Making/ A&P                           Medical Decision Making Amount and/or Complexity of Data Reviewed Labs: ordered. Radiology: ordered.  Risk Prescription drug management. Decision regarding hospitalization.   Cynthie Garmon is a 61  y.o. 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  According to patient, last night she did not eat dinner as she started having some nausea and feeling bad.  She then woke up and tried to turn the fan on because she was having chills and feeling hot at the same time and then when standing up to reach over she went to the ground after she felt dizzy and lightheaded and could not get up.  She does not think she fully passed out but got very close to doing so.  She reports she has chronically had abdominal pain but does not seem to be acutely worsened.  She reports no congestion, cough, chest pain, shortness of breath but does report she had some dysuria and darkened urine.  She reports that she cannot get off the ground and had a call for help but then family could not get her up either so EMS was called to bring her in.  She does not report any other neurologic deficits with no speech difficulties, vision changes, or focal numbness, tingling, or weakness of extremities.  She denies any headache or neck pain at this time.  Family at the bedside whom I spoke with and I reviewed previous notes and documentation about the patient.   On arrival, patient has been tachycardic for the last few hours and still feels both lightheaded and slightly dizzy.  She is also having some tremors and shakes.  On arrival she was also found to have hypoglycemia with a glucose of  35 and was given D50.  Subsequent glucoses have improved into the 80s.  On my exam, lungs were clear and chest was nontender.  Abdomen was not exquisitely tender and I did hear normal bowel sounds.  Back nontender.   Patient moving all extremities.  Mucous membranes do appear pale.  Clinically I am concerned that given the history of significant GI bleed and the dark stools recurring with this near syncope I am concerned she could have recurrent symptomatic anemia leading to the syncopal episode.  With the dysuria, chills, and shaking with tachycardia, I am also concerned she could have a UTI or some other infectious cause.  As she has had known GI bleeding from her cancer and reports of dark stools, will hold on occult testing as I highly suspect there is a degree of blood in her stools chronically from her cancer.  Will get labs including a type and screen.  Patient had CT head and chest x-ray from the fall hall in triage and they were reassuring.  Anticipate reassessment after work-up to determine disposition with anticipated admission.   11:11 AM COVID and flu negative.  X-ray does not show pneumonia.  CT head also reassuring.  Patient's urine does show some leukocytes and bacteria and given her urinary symptoms I am concerned about UTI.  Her white count is much more elevated than prior and I am concerned about an infectious inflammatory process.  Glucose started to downtrend again and she was given more glucose.  We will allow her to eat as well.  Given the near syncope, UTI, recurrent hypoglycemia, do not feel she is safe for admission.  She also has a mild AKI similar to prior.  Her hemoglobin is actually slightly improved from prior so I do wonder if she is partly dehydrated as well.  Will admit for further management of UTI.  I spoke with pharmacy who will order antibiotics for her given her penicillin allergy.  Patient will be admitted.         Final Clinical Impression(s) / ED Diagnoses Final diagnoses:  Hypoglycemia  Near syncope  Dysuria  Acute cystitis without hematuria     Clinical Impression: 1. Hypoglycemia   2. Near syncope   3. Dysuria   4. Acute cystitis without hematuria      Disposition: Admit  This note was prepared with assistance of Dragon voice recognition software. Occasional wrong-word or sound-a-like substitutions may have occurred due to the inherent limitations of voice recognition software.      Angeldejesus Callaham, Gwenyth Allegra, MD 01/01/22 1114    Marlissa Emerick, Gwenyth Allegra, MD 01/01/22 1114

## 2022-01-01 NOTE — ED Notes (Signed)
[employer] report scanned into patient documents and a copy of the report handed off to the nurse.

## 2022-01-02 ENCOUNTER — Other Ambulatory Visit: Payer: Self-pay

## 2022-01-02 ENCOUNTER — Encounter (HOSPITAL_COMMUNITY): Payer: Self-pay | Admitting: Internal Medicine

## 2022-01-02 ENCOUNTER — Observation Stay (HOSPITAL_COMMUNITY): Payer: 59

## 2022-01-02 DIAGNOSIS — W19XXXA Unspecified fall, initial encounter: Secondary | ICD-10-CM | POA: Diagnosis not present

## 2022-01-02 DIAGNOSIS — Z794 Long term (current) use of insulin: Secondary | ICD-10-CM | POA: Diagnosis not present

## 2022-01-02 DIAGNOSIS — E1122 Type 2 diabetes mellitus with diabetic chronic kidney disease: Secondary | ICD-10-CM | POA: Diagnosis present

## 2022-01-02 DIAGNOSIS — E860 Dehydration: Secondary | ICD-10-CM | POA: Diagnosis present

## 2022-01-02 DIAGNOSIS — E8809 Other disorders of plasma-protein metabolism, not elsewhere classified: Secondary | ICD-10-CM | POA: Diagnosis present

## 2022-01-02 DIAGNOSIS — Z6841 Body Mass Index (BMI) 40.0 and over, adult: Secondary | ICD-10-CM | POA: Diagnosis not present

## 2022-01-02 DIAGNOSIS — I480 Paroxysmal atrial fibrillation: Secondary | ICD-10-CM | POA: Diagnosis present

## 2022-01-02 DIAGNOSIS — A419 Sepsis, unspecified organism: Secondary | ICD-10-CM | POA: Diagnosis present

## 2022-01-02 DIAGNOSIS — R55 Syncope and collapse: Secondary | ICD-10-CM | POA: Diagnosis present

## 2022-01-02 DIAGNOSIS — Y713 Surgical instruments, materials and cardiovascular devices (including sutures) associated with adverse incidents: Secondary | ICD-10-CM | POA: Diagnosis present

## 2022-01-02 DIAGNOSIS — I129 Hypertensive chronic kidney disease with stage 1 through stage 4 chronic kidney disease, or unspecified chronic kidney disease: Secondary | ICD-10-CM | POA: Diagnosis present

## 2022-01-02 DIAGNOSIS — C259 Malignant neoplasm of pancreas, unspecified: Secondary | ICD-10-CM | POA: Diagnosis not present

## 2022-01-02 DIAGNOSIS — D630 Anemia in neoplastic disease: Secondary | ICD-10-CM | POA: Diagnosis present

## 2022-01-02 DIAGNOSIS — N179 Acute kidney failure, unspecified: Secondary | ICD-10-CM | POA: Diagnosis present

## 2022-01-02 DIAGNOSIS — Z79899 Other long term (current) drug therapy: Secondary | ICD-10-CM | POA: Diagnosis not present

## 2022-01-02 DIAGNOSIS — I442 Atrioventricular block, complete: Secondary | ICD-10-CM | POA: Diagnosis present

## 2022-01-02 DIAGNOSIS — Z7901 Long term (current) use of anticoagulants: Secondary | ICD-10-CM | POA: Diagnosis not present

## 2022-01-02 DIAGNOSIS — Z880 Allergy status to penicillin: Secondary | ICD-10-CM | POA: Diagnosis not present

## 2022-01-02 DIAGNOSIS — C787 Secondary malignant neoplasm of liver and intrahepatic bile duct: Secondary | ICD-10-CM | POA: Diagnosis present

## 2022-01-02 DIAGNOSIS — E11649 Type 2 diabetes mellitus with hypoglycemia without coma: Secondary | ICD-10-CM | POA: Diagnosis not present

## 2022-01-02 DIAGNOSIS — T8131XA Disruption of external operation (surgical) wound, not elsewhere classified, initial encounter: Secondary | ICD-10-CM | POA: Diagnosis present

## 2022-01-02 DIAGNOSIS — Z20822 Contact with and (suspected) exposure to covid-19: Secondary | ICD-10-CM | POA: Diagnosis present

## 2022-01-02 DIAGNOSIS — Z7985 Long-term (current) use of injectable non-insulin antidiabetic drugs: Secondary | ICD-10-CM | POA: Diagnosis not present

## 2022-01-02 DIAGNOSIS — R6520 Severe sepsis without septic shock: Secondary | ICD-10-CM | POA: Diagnosis present

## 2022-01-02 DIAGNOSIS — T80211A Bloodstream infection due to central venous catheter, initial encounter: Secondary | ICD-10-CM | POA: Diagnosis present

## 2022-01-02 DIAGNOSIS — N189 Chronic kidney disease, unspecified: Secondary | ICD-10-CM | POA: Diagnosis present

## 2022-01-02 DIAGNOSIS — Z833 Family history of diabetes mellitus: Secondary | ICD-10-CM | POA: Diagnosis not present

## 2022-01-02 HISTORY — PX: IR REMOVAL TUN ACCESS W/ PORT W/O FL MOD SED: IMG2290

## 2022-01-02 LAB — URINE CULTURE: Culture: >=100000 — AB

## 2022-01-02 LAB — CBC WITH DIFFERENTIAL/PLATELET
Abs Immature Granulocytes: 0.15 10*3/uL — ABNORMAL HIGH (ref 0.00–0.07)
Basophils Absolute: 0.1 10*3/uL (ref 0.0–0.1)
Basophils Relative: 0 %
Eosinophils Absolute: 0.1 10*3/uL (ref 0.0–0.5)
Eosinophils Relative: 1 %
HCT: 27.9 % — ABNORMAL LOW (ref 36.0–46.0)
Hemoglobin: 8.9 g/dL — ABNORMAL LOW (ref 12.0–15.0)
Immature Granulocytes: 1 %
Lymphocytes Relative: 10 %
Lymphs Abs: 1.7 10*3/uL (ref 0.7–4.0)
MCH: 28.7 pg (ref 26.0–34.0)
MCHC: 31.9 g/dL (ref 30.0–36.0)
MCV: 90 fL (ref 80.0–100.0)
Monocytes Absolute: 0.2 10*3/uL (ref 0.1–1.0)
Monocytes Relative: 1 %
Neutro Abs: 15.6 10*3/uL — ABNORMAL HIGH (ref 1.7–7.7)
Neutrophils Relative %: 87 %
Platelets: 228 10*3/uL (ref 150–400)
RBC: 3.1 MIL/uL — ABNORMAL LOW (ref 3.87–5.11)
RDW: 19.9 % — ABNORMAL HIGH (ref 11.5–15.5)
WBC: 17.7 10*3/uL — ABNORMAL HIGH (ref 4.0–10.5)
nRBC: 0 % (ref 0.0–0.2)

## 2022-01-02 LAB — GLUCOSE, CAPILLARY
Glucose-Capillary: 102 mg/dL — ABNORMAL HIGH (ref 70–99)
Glucose-Capillary: 116 mg/dL — ABNORMAL HIGH (ref 70–99)
Glucose-Capillary: 163 mg/dL — ABNORMAL HIGH (ref 70–99)
Glucose-Capillary: 46 mg/dL — ABNORMAL LOW (ref 70–99)
Glucose-Capillary: 71 mg/dL (ref 70–99)
Glucose-Capillary: 88 mg/dL (ref 70–99)
Glucose-Capillary: 91 mg/dL (ref 70–99)
Glucose-Capillary: 95 mg/dL (ref 70–99)

## 2022-01-02 LAB — BASIC METABOLIC PANEL
Anion gap: 7 (ref 5–15)
BUN: 17 mg/dL (ref 6–20)
CO2: 25 mmol/L (ref 22–32)
Calcium: 7.4 mg/dL — ABNORMAL LOW (ref 8.9–10.3)
Chloride: 102 mmol/L (ref 98–111)
Creatinine, Ser: 1.56 mg/dL — ABNORMAL HIGH (ref 0.44–1.00)
GFR, Estimated: 38 mL/min — ABNORMAL LOW (ref >60)
Glucose, Bld: 91 mg/dL (ref 70–99)
Potassium: 4.1 mmol/L (ref 3.5–5.1)
Sodium: 134 mmol/L — ABNORMAL LOW (ref 135–145)

## 2022-01-02 LAB — CK: Total CK: 82 U/L (ref 38–234)

## 2022-01-02 LAB — PREALBUMIN: Prealbumin: <5 mg/dL — ABNORMAL LOW (ref 18–38)

## 2022-01-02 MED ORDER — FENTANYL CITRATE (PF) 100 MCG/2ML IJ SOLN
INTRAMUSCULAR | Status: AC
  Filled 2022-01-02: qty 4

## 2022-01-02 MED ORDER — LIDOCAINE-EPINEPHRINE 1 %-1:100000 IJ SOLN
INTRAMUSCULAR | Status: AC
  Administered 2022-01-02: 10 mL via SUBCUTANEOUS
  Filled 2022-01-02: qty 1

## 2022-01-02 MED ORDER — FENTANYL CITRATE (PF) 100 MCG/2ML IJ SOLN
INTRAMUSCULAR | Status: AC | PRN
  Administered 2022-01-02 (×2): 25 ug via INTRAVENOUS
  Administered 2022-01-02: 50 ug via INTRAVENOUS

## 2022-01-02 MED ORDER — MIDAZOLAM HCL 2 MG/2ML IJ SOLN
INTRAMUSCULAR | Status: AC | PRN
  Administered 2022-01-02 (×2): 1 mg via INTRAVENOUS

## 2022-01-02 MED ORDER — DEXTROSE-NACL 5-0.9 % IV SOLN: INTRAVENOUS | Status: DC

## 2022-01-02 MED ORDER — MIRTAZAPINE 15 MG PO TABS
15.0000 mg | ORAL_TABLET | Freq: Every day | ORAL | Status: DC
  Administered 2022-01-02 – 2022-01-04 (×3): 15 mg via ORAL
  Filled 2022-01-02 (×3): qty 1

## 2022-01-02 MED ORDER — MIDAZOLAM HCL 2 MG/2ML IJ SOLN
INTRAMUSCULAR | Status: AC
  Filled 2022-01-02: qty 4

## 2022-01-02 MED ORDER — VANCOMYCIN HCL 1250 MG/250ML IV SOLN
1250.0000 mg | INTRAVENOUS | Status: DC
  Administered 2022-01-03 – 2022-01-05 (×3): 1250 mg via INTRAVENOUS
  Filled 2022-01-02 (×3): qty 250

## 2022-01-02 MED ORDER — VANCOMYCIN HCL 10 G IV SOLR
2500.0000 mg | Freq: Once | INTRAVENOUS | Status: AC
  Administered 2022-01-02: 2500 mg via INTRAVENOUS
  Filled 2022-01-02: qty 2500

## 2022-01-02 MED ORDER — ADULT MULTIVITAMIN W/MINERALS CH
1.0000 | ORAL_TABLET | Freq: Every day | ORAL | Status: DC
  Administered 2022-01-03 – 2022-01-05 (×3): 1 via ORAL
  Filled 2022-01-02 (×3): qty 1

## 2022-01-02 NOTE — Progress Notes (Signed)
Initial Nutrition Assessment  DOCUMENTATION CODES:   Morbid obesity  INTERVENTION:   MVI with minerals daily. Continue Glucerna Shake po TID, each supplement provides 220 kcal and 10 grams of protein. If intake is poor, will need to change supplement to Ensure Enlive to provide more calories and protein.   NUTRITION DIAGNOSIS:   Increased nutrient needs related to cancer and cancer related treatments as evidenced by estimated needs.  GOAL:   Patient will meet greater than or equal to 90% of their needs  MONITOR:   PO intake, Supplement acceptance, Labs  REASON FOR ASSESSMENT:   Consult Assessment of nutrition requirement/status  ASSESSMENT:   61 yo female admitted with severe sepsis r/t port-a-cath site dehiscence and infection. PMH includes metastatic pancreatic cancer currently undergoing chemo, HTN, HLD, pacemaker, DM-2, neuropathy, endometrial cancer, recent admission for symptomatic anemia.  Patient was out of her room during RD visit.   She was in IR for removal of port-a-cath. Unable to obtain nutrition hx or complete NFPE. Patient has been NPO since admission yesterday. Diet has been advanced to regular this afternoon.  Labs reviewed. Na 134, prealbumin < 5, albumin 1.9 CBG: 91-95-88  Low albumin and prealbumin are related to acute infection. Albumin and prealbumin are acute phase proteins. Albumin/prealbumin is not an indicator of nutritional status, but rather an indicator of morbidity and mortality, and recovery from acute and chronic illness.   Medications reviewed and include IV antibiotics. IVF: D5 NS at 125 ml/h.  Weight history reviewed. Current weight recorded at 300 lbs, ? Stated weight. Weight has been trending down for the past few months. 152.7 kg on 10/05/21 136.2 kg on 12/29/21 (last admission). This represents an 11% weight loss within 3 months, which is significant for the time frame, putting patient at increased nutrition risk.    NUTRITION  - FOCUSED PHYSICAL EXAM:  Unable to complete, patient out of room for procedure  Diet Order:   Diet Order             Diet regular Room service appropriate? Yes; Fluid consistency: Thin  Diet effective now                   EDUCATION NEEDS:   Not appropriate for education at this time  Skin:  Skin Assessment: Reviewed RN Assessment  Last BM:  1/20  Height:   Ht Readings from Last 1 Encounters:  01/01/22 5\' 8"  (1.727 m)    Weight:   Wt Readings from Last 1 Encounters:  01/01/22 136.1 kg     BMI:  Body mass index is 45.61 kg/m.  Estimated Nutritional Needs:   Kcal:  2000-2300  Protein:  100-125 gm  Fluid:  >/= 2 L    Lucas Mallow RD, LDN, CNSC Please refer to Amion for contact information.

## 2022-01-02 NOTE — Progress Notes (Signed)
Pharmacy Antibiotic Note  Leslie Mullen is a 61 y.o. female admitted on 01/01/2022 with port-a-cath dehiscence and concern for possible infection. Pharmacy has been consulted for Vancomycin dosing.  Pt also on Rocephin as well.  Plan:  Vanc 2500mg  IV x 1 Vanc 1250 mg IV q24h (SCr 1.56, Vd 0.5, estAUC 498) Follow-up clinical progress and renal function. Check levels as indicated.  Height: 5\' 8"  (172.7 cm) Weight: 136.1 kg (300 lb) IBW/kg (Calculated) : 63.9  Temp (24hrs), Avg:99.1 F (37.3 C), Min:98.4 F (36.9 C), Max:99.8 F (37.7 C)  Recent Labs  Lab 12/29/21 1055 01/01/22 0812 01/01/22 0914 01/01/22 1206 01/02/22 0127  WBC 12.8* 24.9*  --   --  17.7*  CREATININE 1.08* 1.43*  --   --  1.56*  LATICACIDVEN  --   --  2.2* 1.5  --     Estimated Creatinine Clearance: 56.9 mL/min (A) (by C-G formula based on SCr of 1.56 mg/dL (H)).    Allergies  Allergen Reactions   Penicillins Shortness Of Breath, Itching and Swelling    Has patient had a PCN reaction causing immediate rash, facial/tongue/throat swelling, SOB or lightheadedness with hypotension: Yes Has patient had a PCN reaction causing severe rash involving mucus membranes or skin necrosis:No Has patient had a PCN reaction that required hospitalization: No Has patient had a PCN reaction occurring within the last 10 years: no If all of the above answers are "NO", then may proceed with Cephalosporin use.  *Per patient, tolerated Keflex in the past      Antimicrobials this admission: Rocephin 1/26 >>  Vancomycin 1/27 >>   Dose adjustments this admission:   Microbiology results: 1/26 BCx: ngtd 1/26 UCx: mult species   Thank you for allowing pharmacy to be a part of this patients care.  [employer], Pharm.D., BCPS Clinical Pharmacist Clinical phone for 01/02/2022 from 7:30-3:00 is [phone].  **Pharmacist phone directory can be found on [email or web address] listed under [place].  01/02/2022 11:50 AM

## 2022-01-02 NOTE — Progress Notes (Signed)
Hypoglycemic Event  CBG: 46  Treatment: D50 50 mL (25 gm)  Symptoms: None  Follow-up CBG: 0908 CBG Result:91  Possible Reasons for Event: Inadequate meal intake  Comments/MD notified:Dr. Starla Link placed orders for IVF change and given D50 amp    Leslie Mullen

## 2022-01-02 NOTE — H&P (Signed)
Chief Complaint: Patient was seen in consultation today for port-a-catheter removal  at the request of Dr. Ky Barban  Referring Physician(s): Dr. Ky Barban  Supervising Physician: Juliet Rude  Patient Status: [facility] - In-pt  History of Present Illness: Leslie Mullen is a 61 y.o. female w/ hx significant for metastatic pancreatic cancer. Pt was admitted for fatigue, no appetite and near syncope. Pt port insertion site was found to be open 3 days ago. Port removal request placed by Dr. Ky Barban. D/t pt not tolerating chemo well, port will replaced at a later date.   Past Medical History:  Diagnosis Date   Abnormal uterine bleeding    Allergy    Anemia    Asthma    Cataract    BOTH EYES   Complete heart block ([place])    a. [employer] PPM 10/2020.   Diabetes ([place])    type II    Diabetic neuropathy ([place])    Endometrial cancer ([place])    Family history of colon cancer    GERD (gastroesophageal reflux disease)    Hyperlipidemia    Hypertension    Morbid obesity ([place])    Osteoarthritis    right knee    Pancreatitis    Pneumonia     Past Surgical History:  Procedure Laterality Date   CERVICAL BIOPSY     CESAREAN SECTION     x3   COLONOSCOPY WITH PROPOFOL N/A 06/29/2017   Procedure: COLONOSCOPY WITH PROPOFOL;  Surgeon: Irene Shipper, MD;  Location: [facility] ENDOSCOPY;  Service: Endoscopy;  Laterality: N/A;   CYSTECTOMY Right 2010   Cyst removed from right groin.    DILATION AND CURETTAGE OF UTERUS N/A 07/29/2021   Procedure: DILATATION AND CURETTAGE OF UTERUS;  Surgeon: Everitt Amber, MD;  Location: [facility] ORS;  Service: Gynecology;  Laterality: N/A;   INTRAUTERINE DEVICE (IUD) INSERTION N/A 07/29/2021   Procedure: INTRAUTERINE DEVICE (IUD) INSERTION;  Surgeon: Everitt Amber, MD;  Location: [facility] ORS;  Service: Gynecology;  Laterality: N/A;  IUD FROM MAIN PHARMACY   IR IMAGING GUIDED PORT INSERTION  11/11/2021   PACEMAKER IMPLANT N/A 10/11/2020   Procedure: PACEMAKER IMPLANT;  Surgeon: Vickie Epley, MD;  Location: [facility];  Service: Cardiovascular;  Laterality: N/A;   TONSILLECTOMY      Allergies: Penicillins  Medications: Prior to Admission medications   Medication Sig Start Date End Date Taking? Authorizing Provider  amLODipine (NORVASC) 10 MG tablet Take 1 tablet (10 mg total) by mouth at bedtime. 01/03/21   [name], MD  apixaban (ELIQUIS) 5 MG TABS tablet Take 1 tablet (5 mg total) by mouth 2 (two) times daily. 12/23/21   Little Ishikawa, MD  gabapentin (NEURONTIN) 300 MG capsule Take 1 capsule (300 mg total) by mouth 2 (two) times daily. 10/12/20   Evans Lance, MD  glucose blood test strip  07/24/15   [provider]  insulin degludec (TRESIBA FLEXTOUCH) 200 UNIT/ML FlexTouch Pen Inject 20 Units into the skin every evening. Start at 20 units daily-if you blood glucose levels are still on the higher side-slowly increase back to your usual regimen of 80 units daily. 10/09/21   Ghimire, Henreitta Leber, MD  Insulin Pen Needle 31G X 5 MM MISC  08/01/15   [provider]  lidocaine-prilocaine (EMLA) cream Apply to affected area once 10/14/21   Truitt Merle, MD  loratadine (CLARITIN) 10 MG tablet Take 10 mg by mouth daily as needed for allergies.  [provider]  LORazepam (ATIVAN) 0.5 MG tablet Take 1 tablet (0.5 mg total) by mouth 2 (two) times daily as needed (for anxiety, and/or chemo-related nausea/vomiting). 11/13/21   Alla Feeling, NP  magic mouthwash w/lidocaine SOLN Take 5 mLs by mouth 4 (four) times daily. 11/06/21   Alla Feeling, NP  mirtazapine (REMERON) 15 MG tablet Take 1 tablet (15 mg total) by mouth at bedtime. 12/11/21   Truitt Merle, MD  Multiple Vitamin (MULTIVITAMIN) capsule Take 1 capsule by mouth daily.    [provider]  omeprazole (PRILOSEC) 20 MG capsule Take 20 mg by mouth daily. 12/18/21   [provider]  omeprazole (PRILOSEC) 40 MG capsule Take 40 mg by mouth 2 (two) times daily. 09/05/21    [provider]  ondansetron (ZOFRAN) 8 MG tablet Take 1 tablet (8 mg total) by mouth 2 (two) times daily as needed (Nausea or vomiting). 11/13/21   Alla Feeling, NP  [facility] DELICA LANCETS 94W MISC  08/01/15   [provider]  Oxycodone HCl 10 MG TABS Take 1 tablet (10 mg total) by mouth every 6 (six) hours as needed. Patient taking differently: Take 10 mg by mouth every 6 (six) hours as needed (pain). 12/11/21   Truitt Merle, MD  polyethylene glycol (MIRALAX / GLYCOLAX) 17 g packet Take 17 g by mouth daily as needed for moderate constipation.    [provider]  pravastatin (PRAVACHOL) 40 MG tablet TAKE 1 TABLET(40 MG) BY MOUTH DAILY 07/23/20   [name], MD  prochlorperazine (COMPAZINE) 10 MG tablet TAKE 1 TABLET(10 MG) BY MOUTH EVERY 6 HOURS AS NEEDED FOR NAUSEA OR VOMITING 12/16/21   Alla Feeling, NP  SYNJARDY XR 12.04-999 MG TB24 Take 1 tablet by mouth 2 (two) times daily. 05/30/21   [provider]  trolamine salicylate (ASPERCREME) 10 % cream Apply 1 application topically 2 (two) times daily as needed for muscle pain.    [provider]     Family History  Adopted: Yes  Problem Relation Age of Onset   Diabetes Father    Heart disease Father    Cancer Mother        colon   Colon cancer Neg Hx    Esophageal cancer Neg Hx    Rectal cancer Neg Hx    Stomach cancer Neg Hx     Social History   Socioeconomic History   Marital status: Married    Spouse name: Not on file   Number of children: 2   Years of education: Not on file   Highest education level: Not on file  Occupational History   Occupation: Intake Coordinator  Tobacco Use   Smoking status: Former    Types: Cigarettes   Smokeless tobacco: Never   Tobacco comments:    Former smoker (09/18/2021)  Vaping Use   Vaping Use: Never used  Substance and Sexual Activity   Alcohol use: No    Alcohol/week: 0.0 standard drinks   Drug use: No   Sexual activity: Not on file   Other Topics Concern   Not on file  Social History Narrative   Not on file   Social Determinants of Health   Financial Resource Strain: Not on file  Food Insecurity: Not on file  Transportation Needs: Not on file  Physical Activity: Not on file  Stress: Not on file  Social Connections: Not on file    Review of Systems: A 12 point ROS discussed and pertinent positives  are indicated in the HPI above.  All other systems are negative.  Review of Systems  Constitutional:  Negative for appetite change, chills and fever.  HENT:  Negative for nosebleeds.   Respiratory:  Negative for cough and shortness of breath.   Cardiovascular:  Negative for chest pain and leg swelling.  Gastrointestinal:  Negative for abdominal pain, nausea and vomiting.  Genitourinary:  Negative for hematuria.  Neurological:  Negative for dizziness and headaches.   Vital Signs: BP 132/73 (BP Location: Left Arm)    Pulse (!) 102    Temp 99.3 F (37.4 C) (Oral)    Resp 18    Ht 5\' 8"  (1.727 m)    Wt 300 lb (136.1 kg)    SpO2 96%    BMI 45.61 kg/m   Physical Exam Constitutional:      Appearance: She is not ill-appearing.  HENT:     Head: Normocephalic and atraumatic.     Mouth/Throat:     Mouth: Mucous membranes are dry.     Pharynx: Oropharynx is clear.  Cardiovascular:     Rate and Rhythm: Regular rhythm. Tachycardia present.     Pulses: Normal pulses.     Heart sounds: Normal heart sounds. No murmur heard.   No friction rub. No gallop.     Comments: Pt has pacemaker to L upper chest Pulmonary:     Effort: Pulmonary effort is normal. No respiratory distress.     Breath sounds: No stridor. No wheezing, rhonchi or rales.  Abdominal:     General: Bowel sounds are normal.  Musculoskeletal:     Right lower leg: Edema present.     Left lower leg: Edema present.  Skin:    General: Skin is warm and dry.     Comments: Port incision site has small opening. There is no drainage, bleeding, warmth or swelling  noted. Dressing is C/D/I  Neurological:     Mental Status: She is alert and oriented to person, place, and time.  Psychiatric:        Mood and Affect: Mood normal.        Behavior: Behavior normal.        Thought Content: Thought content normal.        Judgment: Judgment normal.    Imaging: CT HEAD WO CONTRAST (5MM)  Result Date: 01/01/2022 CLINICAL DATA:  Nonspecific dizziness with slight headache. On Eliquis. EXAM: CT HEAD WITHOUT CONTRAST TECHNIQUE: Contiguous axial images were obtained from the base of the skull through the vertex without intravenous contrast. RADIATION DOSE REDUCTION: This exam was performed according to the departmental dose-optimization program which includes automated exposure control, adjustment of the mA and/or kV according to patient size and/or use of iterative reconstruction technique. COMPARISON:  Sixteen days ago FINDINGS: Brain: No evidence of acute infarction, hemorrhage, hydrocephalus, extra-axial collection or mass lesion/mass effect. Dilated perivascular space below the left putamen. Notable cerebellar volume loss for age. Vascular: No hyperdense vessel or unexpected calcification. Skull: Normal. Negative for fracture or focal lesion. Sinuses/Orbits: No acute finding. IMPRESSION: No acute or interval finding. Electronically Signed   By: Jorje Guild M.D.   On: 01/01/2022 06:11   CT Head Wo Contrast  Result Date: 12/16/2021 CLINICAL DATA:  Head trauma, moderate to severe. Fall with head injury on anticoagulation. EXAM: CT HEAD WITHOUT CONTRAST TECHNIQUE: Contiguous axial images were obtained from the base of the skull through the vertex without intravenous contrast. COMPARISON:  None. FINDINGS: Brain: The brain shows a normal appearance without  evidence of malformation, atrophy, old or acute small or large vessel infarction, mass lesion, hemorrhage, hydrocephalus or extra-axial collection. Vascular: There is atherosclerotic calcification of the major vessels at  the base of the brain. Skull: Normal.  No traumatic finding.  No focal bone lesion. Sinuses/Orbits: Sinuses are clear. Orbits appear normal. Mastoids are clear. Other: None significant IMPRESSION: No acute or traumatic finding. Normal study with exception of atherosclerotic calcification of the major vessels at the base of brain. Electronically Signed   By: Nelson Chimes M.D.   On: 12/16/2021 13:19   DG Chest Portable 1 View  Result Date: 01/01/2022 CLINICAL DATA:  61 year old female with history of dizziness. EXAM: PORTABLE CHEST 1 VIEW COMPARISON:  Chest x-ray 06/15/2021. FINDINGS: Lung volumes are low. No consolidative airspace disease. No pleural effusions. No pneumothorax. No pulmonary nodule or mass noted. Pulmonary vasculature and the cardiomediastinal silhouette are within normal limits. Left-sided pacemaker device in place with lead tips projecting over the expected location of the right atrium and right ventricle. Right internal jugular single-lumen power porta cath with tip terminating in the mid superior vena cava. IMPRESSION: 1. Low lung volumes without radiographic evidence of acute cardiopulmonary disease. Electronically Signed   By: Vinnie Langton M.D.   On: 01/01/2022 05:39   ECHOCARDIOGRAM COMPLETE  Result Date: 12/17/2021    ECHOCARDIOGRAM REPORT   Patient Name:   DALLAS SCORSONE Date of Exam: 12/17/2021 Medical Rec #:  [id number]    Height:       68.0 in Accession #:    [id number]   Weight:       308.0 lb Date of Birth:  10-10-1961    BSA:          2.455 m Patient Age:    30 years     BP:           124/71 mmHg Patient Gender: F            HR:           81 bpm. Exam Location:  Inpatient Procedure: 2D Echo Indications:     Syncope  History:         Patient has prior history of Echocardiogram examinations, most                  recent 10/10/2020. Risk Factors:Diabetes, Hypertension and                  Dyslipidemia. Complete heart block.  Sonographer:     Arlyss Gandy Referring Phys:  [id number] RALPH  A NETTEY Diagnosing Phys: Fransico Him MD IMPRESSIONS  1. Left ventricular ejection fraction, by estimation, is 60 to 65%. The left ventricle has normal function. The left ventricle has no regional wall motion abnormalities. Left ventricular diastolic parameters are consistent with Grade I diastolic dysfunction (impaired relaxation).  2. Right ventricular systolic function is normal. The right ventricular size is normal. There is normal pulmonary artery systolic pressure. The estimated right ventricular systolic pressure is 63.7 mmHg.  3. The mitral valve is normal in structure. No evidence of mitral valve regurgitation. No evidence of mitral stenosis.  4. The aortic valve is tricuspid. Aortic valve regurgitation is not visualized. Aortic valve sclerosis is present, with no evidence of aortic valve stenosis.  5. The inferior vena cava is dilated in size with <50% respiratory variability, suggesting right atrial pressure of 15 mmHg. FINDINGS  Left Ventricle: Left ventricular ejection fraction, by estimation, is 60 to 65%. The left ventricle has normal function.  The left ventricle has no regional wall motion abnormalities. The left ventricular internal cavity size was normal in size. There is  no left ventricular hypertrophy. Left ventricular diastolic parameters are consistent with Grade I diastolic dysfunction (impaired relaxation). Normal left ventricular filling pressure. Right Ventricle: The right ventricular size is normal. No increase in right ventricular wall thickness. Right ventricular systolic function is normal. There is normal pulmonary artery systolic pressure. The tricuspid regurgitant velocity is 2.23 m/s, and  with an assumed right atrial pressure of 15 mmHg, the estimated right ventricular systolic pressure is 28.4 mmHg. Left Atrium: Left atrial size was normal in size. Right Atrium: Right atrial size was normal in size. Pericardium: There is no evidence of pericardial effusion. Mitral Valve: The  mitral valve is normal in structure. No evidence of mitral valve regurgitation. No evidence of mitral valve stenosis. Tricuspid Valve: The tricuspid valve is normal in structure. Tricuspid valve regurgitation is trivial. No evidence of tricuspid stenosis. Aortic Valve: The aortic valve is tricuspid. Aortic valve regurgitation is not visualized. Aortic valve sclerosis is present, with no evidence of aortic valve stenosis. Aortic valve mean gradient measures 4.0 mmHg. Aortic valve peak gradient measures 7.4  mmHg. Aortic valve area, by VTI measures 2.99 cm. Pulmonic Valve: The pulmonic valve was normal in structure. Pulmonic valve regurgitation is not visualized. No evidence of pulmonic stenosis. Aorta: The aortic root is normal in size and structure. Venous: The inferior vena cava is dilated in size with less than 50% respiratory variability, suggesting right atrial pressure of 15 mmHg. IAS/Shunts: No atrial level shunt detected by color flow Doppler. Additional Comments: A device lead is visualized.  LEFT VENTRICLE PLAX 2D LVIDd:         4.70 cm   Diastology LVIDs:         3.40 cm   LV e' medial:    6.09 cm/s LV PW:         1.00 cm   LV E/e' medial:  13.6 LV IVS:        1.00 cm   LV e' lateral:   6.31 cm/s LVOT diam:     2.30 cm   LV E/e' lateral: 13.2 LV SV:         82 LV SV Index:   34 LVOT Area:     4.15 cm  RIGHT VENTRICLE             IVC RV S prime:     11.30 cm/s  IVC diam: 2.50 cm TAPSE (M-mode): 1.8 cm LEFT ATRIUM             Index LA diam:        3.40 cm 1.38 cm/m LA Vol (A2C):   48.8 ml 19.88 ml/m LA Vol (A4C):   47.7 ml 19.43 ml/m LA Biplane Vol: 48.5 ml 19.75 ml/m  AORTIC VALVE AV Area (Vmax):    2.61 cm AV Area (Vmean):   2.68 cm AV Area (VTI):     2.99 cm AV Vmax:           136.00 cm/s AV Vmean:          95.100 cm/s AV VTI:            0.275 m AV Peak Grad:      7.4 mmHg AV Mean Grad:      4.0 mmHg LVOT Vmax:         85.50 cm/s LVOT Vmean:        61.400 cm/s  LVOT VTI:          0.198 m LVOT/AV  VTI ratio: 0.72  AORTA Ao Root diam: 3.10 cm Ao Asc diam:  2.90 cm MITRAL VALVE               TRICUSPID VALVE MV Area (PHT): 2.87 cm    TR Peak grad:   19.9 mmHg MV Decel Time: 264 msec    TR Vmax:        223.00 cm/s MV E velocity: 83.10 cm/s MV A velocity: 98.50 cm/s  SHUNTS MV E/A ratio:  0.84        Systemic VTI:  0.20 m                            Systemic Diam: 2.30 cm Fransico Him MD Electronically signed by Fransico Him MD Signature Date/Time: 12/17/2021/9:03:38 AM    Final (Updated)     Labs:  CBC: Recent Labs    12/20/21 0442 12/29/21 1055 01/01/22 0812 01/02/22 0127  WBC 6.6 12.8* 24.9* 17.7*  HGB 7.2* 11.2* 12.0 8.9*  HCT 23.0* 35.4* 38.8 27.9*  PLT 207 355 397 228    COAGS: Recent Labs    06/15/21 0050 10/06/21 1637 10/07/21 0219 10/08/21 0254 10/08/21 1025 10/09/21 0158 01/01/22 0812  INR 1.0 1.5*  --   --   --   --  1.3*  APTT  --  27   < > 88* 50* 64* 28   < > = values in this interval not displayed.    BMP: Recent Labs    12/20/21 0442 12/29/21 1055 01/01/22 0812 01/02/22 0127  NA 138 137 139 134*  K 3.8 4.5 4.0 4.1  CL 106 102 103 102  CO2 28 26 25 25   GLUCOSE 143* 238* 68* 91  BUN 20 11 16 17   CALCIUM 7.8* 8.1* 8.4* 7.4*  CREATININE 1.09* 1.08* 1.43* 1.56*  GFRNONAA 59* 59* 42* 38*    LIVER FUNCTION TESTS: Recent Labs    12/16/21 1230 12/17/21 0340 12/29/21 1055 01/01/22 0812  BILITOT 0.5 0.7 0.4 0.5  AST 36 25 17 23   ALT 16 15 8 11   ALKPHOS 57 50 100 109  PROT 6.0* 5.5* 6.7 7.7  ALBUMIN 1.8* 1.7* 2.4* 1.9*    TUMOR MARKERS: No results for input(s): AFPTM, CEA, CA199, CHROMGRNA in the last 8760 hours.  Assessment and Plan: Pt with hx significant for metastatic pancreatic cancer. Pt was admitted for fatigue, no appetite and near syncope. Pt port insertion site was found to be open 3 days ago. Port removal request placed by Dr. Ky Barban. D/t pt not tolerating chemo well, port will replaced at a later date.   Pt resting quietly in  bed. She is A&O, calm and pleasant.  Pt husband at bedside.  Pt states she is NPO since yesterday.   Risks and benefits of port-a-catheter removal was discussed with the patient including, but not limited to bleeding, infection, pneumothorax and need for additional procedures.  All of the patient's questions were answered, patient is agreeable to proceed. Consent signed and in chart.   Thank you for this interesting consult.  I greatly enjoyed meeting Leslie Mullen and look forward to participating in their care.  A copy of this report was sent to the requesting provider on this date.  Electronically Signed: Tyson Alias, NP 01/02/2022, 10:43 AM   I spent a total of 20 minutes in face to  face in clinical consultation, greater than 50% of which was counseling/coordinating care for port-a-catheter removal.

## 2022-01-02 NOTE — Procedures (Signed)
Interventional Radiology Procedure:   Indications: Partial dehiscence of port incision  Procedure: Port removal  Findings: Port incision dehiscence has closed.  Incision site was opened.  No purulent fluid.  Port was completely removed. Minimal bloody fluid in the pocket.  Pocket was irrigated with saline.  Port pocket was closed with 3 interrupted sutures.   Complications: No immediate complications noted.     EBL: Minimal  Plan: Due to concern for infection, port was closed with interrupted sutures to allow for potential drainage.  IR will f/u incision and anticipate suture removal in 10-14 days.    Aveah Castell R. Anselm Pancoast, MD  Pager: [phone]

## 2022-01-02 NOTE — TOC Transition Note (Signed)
Transition of Care (TOC) - CM/SW Discharge Note [name] RN, BSN Transitions of Care Unit 4E- RN Case Manager See Treatment Team for direct phone #    Patient Details  Name: Leslie Mullen MRN: [id number] Date of Birth: 02/25/1961  Transition of Care [facility]) CM/SW Contact:  Dawayne Patricia, RN Phone Number: 01/02/2022, 11:09 AM   Clinical Narrative:    Referral received for HH/DME needs. Noted PT recommendations for HHPT - DME-RW (pt has one being delivered to home this weekend) CM spoke with pt at bedside to discuss [facility] and DME. Pt confirmed she has RW coming to home- and denies any other DME needs. Discussed recommendation for HHPT- pt states she would like to check with her insurance first regarding coverage before committing to [facility]. List provided to pt on agencies for choice- and that she can review with insurance for in-network providers. Pt will f/u with her PCP should she decide she wants to do HHPT.   No further TOC needs noted, pt to return home with family.    Final next level of care: Home/Self Care Barriers to Discharge: No Barriers Identified   Patient Goals and CMS Choice Patient states their goals for this hospitalization and ongoing recovery are:: return home CMS Medicare.gov Compare Post Acute Care list provided to:: Patient Choice offered to / list presented to : Patient  Discharge Placement                 Home      Discharge Plan and Services   Discharge Planning Services: CM Consult Post Acute Care Choice: Home Health, Durable Medical Equipment          DME Arranged: N/A DME Agency: NA       HH Arranged: NA HH Agency: NA        Social Determinants of Health (SDOH) Interventions     Readmission Risk Interventions No flowsheet data found.

## 2022-01-02 NOTE — Progress Notes (Signed)
Physical Therapy Treatment Patient Details Name: Leslie Mullen MRN: [id number] DOB: 1961-12-05 Today's Date: 01/02/2022   History of Present Illness 61 y.o. female presents to [facility] hospital on 01/01/2022 after falling. Pt with recent admission 1/10-1/14 with symptomatic anemia. Pt admitted for management of SIRS. PMH includes HTN, CHB s/p PPM, DMII, metastatic pancreatic CA, endometrial CA.    PT Comments    Pt received in bed, agreeable to participation in therapy. Mobility limited by dizziness and tachycardia. Pt stood bedside with RW as well as transferred to/from [facility]. She required min assist supine to sit, min assist sit to stand, min assist SPT, min guard assist sidestepping with RW, and mod assist sit to supine. Resting HR 102. Sustained in 140s during stance and transfers. Orthostatic BP stable but pt with c/o dizziness with stance. Pt supine in bed at end of session. Plans for IR this afternoon for port removal. Recommending w/c for home for mobility outside the home.   Recommendations for follow up therapy are one component of a multi-disciplinary discharge planning process, led by the attending physician.  Recommendations may be updated based on patient status, additional functional criteria and insurance authorization.  Follow Up Recommendations  Home health PT     Assistance Recommended at Discharge Intermittent Supervision/Assistance  Patient can return home with the following A little help with walking and/or transfers;A little help with bathing/dressing/bathroom;Assist for transportation;Assistance with Education officer, environmental (measurements PT);Wheelchair cushion (measurements PT) (Pt has rollator ordered for delivery this weekend.)    Recommendations for Other Services       Precautions / Restrictions Precautions Precautions: Fall;Other (comment) Precaution Comments: monitor BP and HR     Mobility  Bed Mobility Overal bed mobility:  Needs Assistance Bed Mobility: Supine to Sit, Sit to Supine     Supine to sit: Min assist, HOB elevated Sit to supine: Mod assist   General bed mobility comments: increased time, assist to elevate trunk for OOB, assist with BLE back to bed, +rail    Transfers Overall transfer level: Needs assistance Equipment used: Rolling walker (2 wheels) Transfers: Sit to/from Stand, Bed to chair/wheelchair/BSC Sit to Stand: Min assist   Step pivot transfers: Min assist       General transfer comment: rocking mometum to power up, pt pulling up on RW    Ambulation/Gait Ambulation/Gait assistance: Min guard Gait Distance (Feet): 3 Feet Assistive device: Rolling walker (2 wheels) Gait Pattern/deviations: Step-to pattern       General Gait Details: sidestepping along bedside with RW toward [facility]   Stairs             Wheelchair Mobility    Modified Rankin (Stroke Patients Only)       Balance Overall balance assessment: Needs assistance Sitting-balance support: No upper extremity supported, Feet supported Sitting balance-Leahy Scale: Fair     Standing balance support: Bilateral upper extremity supported, Reliant on assistive device for balance Standing balance-Leahy Scale: Poor                              Cognition Arousal/Alertness: Awake/alert Behavior During Therapy: WFL for tasks assessed/performed Overall Cognitive Status: Within Functional Limits for tasks assessed                                          Exercises  General Comments General comments (skin integrity, edema, etc.): Orthostatic BP: supine 117/62, sitting 129/80, standing 126/77. Resting HR 102. HR in 120s sitting EOB and sustained in 140s with standing and transfers. Pt with c/o dizziness with stance.      Pertinent Vitals/Pain Pain Assessment Pain Assessment: Faces Faces Pain Scale: No hurt    Home Living                          Prior Function             PT Goals (current goals can now be found in the care plan section) Acute Rehab PT Goals Patient Stated Goal: to improve activity tolerance and stop getting dizzy Progress towards PT goals: Not progressing toward goals - comment (dizziness, tachycardia limiting mobility)    Frequency    Min 3X/week      PT Plan Equipment recommendations need to be updated    Co-evaluation              AM-PAC PT "6 Clicks" Mobility   Outcome Measure  Help needed turning from your back to your side while in a flat bed without using bedrails?: A Little Help needed moving from lying on your back to sitting on the side of a flat bed without using bedrails?: A Little Help needed moving to and from a bed to a chair (including a wheelchair)?: A Little Help needed standing up from a chair using your arms (e.g., wheelchair or bedside chair)?: A Little Help needed to walk in hospital room?: A Little Help needed climbing 3-5 steps with a railing? : Total 6 Click Score: 16    End of Session   Activity Tolerance: Treatment limited secondary to medical complications (Comment) (dizziness, tachycardia) Patient left: in bed;with call bell/phone within reach;with family/visitor present Nurse Communication: Mobility status PT Visit Diagnosis: Other abnormalities of gait and mobility (R26.89)     Time: 1610-9604 PT Time Calculation (min) (ACUTE ONLY): 24 min  Charges:  $Gait Training: 8-22 mins $Therapeutic Activity: 8-22 mins                     Lorrin Goodell, PT  Office # [phone] Pager [phone]    [name] 01/02/2022, 11:16 AM

## 2022-01-02 NOTE — Progress Notes (Signed)
Spoke with Melissa in [facility] IR regarding PAC removal.  Pt is scheduled for PAC today at 12:30pm.  Notified Dr. Burr Medico.

## 2022-01-02 NOTE — Progress Notes (Signed)
LVM for [facility] IR [phone]) regarding having pt's PAC removed today if possible since pt is admitted at [facility] return call from [place].

## 2022-01-02 NOTE — Progress Notes (Signed)
Patient ID: Leslie Mullen, female   DOB: 10-Jul-1961, 61 y.o.   MRN: [id number]  PROGRESS NOTE    Leslie Mullen  NKN:[id number] DOB: [birth year] DOA: 01/01/2022 PCP: Elinor Parkinson   Brief Narrative:  61 y.o. female with medical history significant of hypertension, complete heart block s/p Medtronic PPM, DM type II, metastatic pancreatic cancer currently undergoing chemotherapy, endometrial cancer, morbid obesity, recent hospitalization from 12/16/2021-12/20/2021 for symptomatic anemia in the setting of chemotherapy/metastatic pancreatic cancer requiring 4 units of packed red cell transfusion; recent port site dehiscence for which outpatient port removal and new port placement was being planned.  She presented on 01/01/2022 with dizziness and fall.  On presentation, she was tachycardic, febrile, tachypneic with leukocytosis, creatinine of 1.43, glucose of 35 and lactic acid of 2.2.  CT of the head was negative for any acute intracranial abnormity.  Chest x-ray was negative for any acute infiltrates.  She was started on intravenous antibiotics and fluids.  Oncology was consulted.  Assessment & Plan:   Severe sepsis: Present on admission Possible Port-A-Cath site dehiscence and infection Doubt that patient had UTI Lactic acidosis -Patient presented with dizziness and fall and was found to be febrile, tachycardic, tachypneic with leukocytosis, acute kidney injury with lactic acidosis with concern for Port-A-Cath site dehiscence/infection -Currently on Rocephin.  We will add vancomycin as well.  Follow cultures. -Switch IV fluids to D5 normal saline at 125 cc an hour. -Blood pressure currently stable. -IR has been consulted for Port-A-Cath removal.  New Port-A-Cath placement can be done as an outpatient.  Acute kidney injury -Baseline creatinine around 1.08.  Presented with creatinine of 1.43.  Creatinine 1.56 today.  IV fluids plan as above.  Leukocytosis -Improving but still significant.  Repeat  a.m. labs.  Hypoglycemia in a patient with diabetes mellitus type 2 -Still hypoglycemic.  Switch IV fluids as above.  Monitor blood sugars.  Home medications including Tresiba on hold.  Metastatic pancreatic cancer -Currently undergoing chemotherapy.  Oncology evaluation appreciated.  Chemotherapy to remain on hold for now and patient is to follow-up with oncology as an outpatient for resumption.  Port-A-Cath plan as above  Fall secondary to dizziness -Possibly from all of the above.  PT eval.  No chest pain or palpitations.  High-sensitivity troponins were negative x2. -Fall precautions.  Paroxysmal A. fib on chronic anticoagulation -Currently rate controlled.  Continue Eliquis  History of complete heart block status post Medtronic PPM -Outpatient follow-up with EP  Essential hypertension -Blood pressure currently stable.  Amlodipine on hold  Anxiety -Continue lorazepam as needed  Hypoalbuminemia -Possibly from poor oral intake.  Consult nutrition  Anemia of chronic disease: From cancer and chemotherapy History of present GI bleed requiring 4 unit packed red cells transfusion -Hemoglobin currently stable.  Monitor  Morbid obesity -Outpatient follow-up  DVT prophylaxis: Eliquis Code Status: Full Family Communication: Husband at bedside Disposition Plan: Status is: Observation  The patient will require care spanning > 2 midnights and should be moved to inpatient because: Of need for IV fluids and antibiotics  Consultants: Oncology/IR  Procedures: None  Antimicrobials:  Anti-infectives (From admission, onward)    Start     Dose/Rate Route Frequency Ordered Stop   01/01/22 1200  cefTRIAXone (ROCEPHIN) 1 g in sodium chloride 0.9 % 100 mL IVPB        1 g 200 mL/hr over 30 Minutes Intravenous Every 24 hours 01/01/22 1125     01/01/22 1130  cefTRIAXone (ROCEPHIN) 1 g in sodium chloride 0.9 %  100 mL IVPB  Status:  Discontinued        1 g 200 mL/hr over 30 Minutes  Intravenous  Once 01/01/22 1123 01/01/22 1125        Subjective: Patient seen and examined at bedside.  Still feels weak.  Denies any overnight fever or vomiting.  As per nursing staff, patient's blood sugar dropped to the 40s this morning.  No fever, seizures or vomiting reported.  Objective: Vitals:   01/01/22 2054 01/02/22 0008 01/02/22 0256 01/02/22 0757  BP:  130/77 139/70 132/73  Pulse:  92 100 (!) 102  Resp:  17 18 18   Temp: 99.2 F (37.3 C) 99.2 F (37.3 C) 98.4 F (36.9 C) 99.3 F (37.4 C)  TempSrc: Oral Oral Oral Oral  SpO2:  96% 96% 96%  Weight:      Height:        Intake/Output Summary (Last 24 hours) at 01/02/2022 1044 Last data filed at 01/02/2022 0400 Gross per 24 hour  Intake 607.38 ml  Output 450 ml  Net 157.38 ml   Filed Weights   01/01/22 0446  Weight: 136.1 kg    Examination:  General exam: Appears calm and comfortable.  Currently on room air. Respiratory system: Bilateral decreased breath sounds at bases with some scattered crackles Cardiovascular system: S1 & S2 heard, Rate controlled Gastrointestinal system: Abdomen is morbidly obese, nondistended, soft and nontender. Normal bowel sounds heard. Extremities: No cyanosis, clubbing; trace lower extremity edema Central nervous system: Alert and oriented. No focal neurological deficits. Moving extremities Skin: No rashes, lesions or ulcers Psychiatry: Judgement and insight appear normal. Mood & affect appropriate.     Data Reviewed: I have personally reviewed following labs and imaging studies  CBC: Recent Labs  Lab 12/29/21 1055 01/01/22 0812 01/02/22 0127  WBC 12.8* 24.9* 17.7*  NEUTROABS 10.3* 23.8* 15.6*  HGB 11.2* 12.0 8.9*  HCT 35.4* 38.8 27.9*  MCV 88.9 91.1 90.0  PLT 355 397 409   Basic Metabolic Panel: Recent Labs  Lab 12/29/21 1055 01/01/22 0812 01/02/22 0127  NA 137 139 134*  K 4.5 4.0 4.1  CL 102 103 102  CO2 26 25 25   GLUCOSE 238* 68* 91  BUN 11 16 17    CREATININE 1.08* 1.43* 1.56*  CALCIUM 8.1* 8.4* 7.4*   GFR: Estimated Creatinine Clearance: 56.9 mL/min (A) (by C-G formula based on SCr of 1.56 mg/dL (H)). Liver Function Tests: Recent Labs  Lab 12/29/21 1055 01/01/22 0812  AST 17 23  ALT 8 11  ALKPHOS 100 109  BILITOT 0.4 0.5  PROT 6.7 7.7  ALBUMIN 2.4* 1.9*   Recent Labs  Lab 01/01/22 0812  LIPASE 23   No results for input(s): AMMONIA in the last 168 hours. Coagulation Profile: Recent Labs  Lab 01/01/22 0812  INR 1.3*   Cardiac Enzymes: Recent Labs  Lab 01/02/22 0127  CKTOTAL 82   BNP (last 3 results) No results for input(s): PROBNP in the last 8760 hours. HbA1C: No results for input(s): HGBA1C in the last 72 hours. CBG: Recent Labs  Lab 01/01/22 2121 01/02/22 0012 01/02/22 0401 01/02/22 0752 01/02/22 0908  GLUCAP 98 116* 71 46* 91   Lipid Profile: No results for input(s): CHOL, HDL, LDLCALC, TRIG, CHOLHDL, LDLDIRECT in the last 72 hours. Thyroid Function Tests: Recent Labs    01/01/22 0914  TSH 1.701   Anemia Panel: No results for input(s): VITAMINB12, FOLATE, FERRITIN, TIBC, IRON, RETICCTPCT in the last 72 hours. Sepsis Labs: Recent Labs  Lab  01/01/22 0914 01/01/22 1206  LATICACIDVEN 2.2* 1.5    Recent Results (from the past 240 hour(s))  Resp Panel by RT-PCR (Flu A&B, Covid) Nasopharyngeal Swab     Status: None   Collection Time: 01/01/22  8:16 AM   Specimen: Nasopharyngeal Swab; Nasopharyngeal(NP) swabs in vial transport medium  Result Value Ref Range Status   SARS Coronavirus 2 by RT PCR NEGATIVE NEGATIVE Final    Comment: (NOTE) SARS-CoV-2 target nucleic acids are NOT DETECTED.  The SARS-CoV-2 RNA is generally detectable in upper respiratory specimens during the acute phase of infection. The lowest concentration of SARS-CoV-2 viral copies this assay can detect is 138 copies/mL. A negative result does not preclude SARS-Cov-2 infection and should not be used as the sole basis  for treatment or other patient management decisions. A negative result may occur with  improper specimen collection/handling, submission of specimen other than nasopharyngeal swab, presence of viral mutation(s) within the areas targeted by this assay, and inadequate number of viral copies(<138 copies/mL). A negative result must be combined with clinical observations, patient history, and epidemiological information. The expected result is Negative.  Fact Sheet for Patients:  EntrepreneurPulse.com.au  Fact Sheet for Healthcare Providers:  IncredibleEmployment.be  This test is no t yet approved or cleared by the [place] FDA and  has been authorized for detection and/or diagnosis of SARS-CoV-2 by FDA under an Emergency Use Authorization (EUA). This EUA will remain  in effect (meaning this test can be used) for the duration of the COVID-19 declaration under Section 564(b)(1) of the Act, 21 U.S.C.section 360bbb-3(b)(1), unless the authorization is terminated  or revoked sooner.       Influenza A by PCR NEGATIVE NEGATIVE Final   Influenza B by PCR NEGATIVE NEGATIVE Final    Comment: (NOTE) The Xpert Xpress SARS-CoV-2/FLU/RSV plus assay is intended as an aid in the diagnosis of influenza from Nasopharyngeal swab specimens and should not be used as a sole basis for treatment. Nasal washings and aspirates are unacceptable for Xpert Xpress SARS-CoV-2/FLU/RSV testing.  Fact Sheet for Patients: EntrepreneurPulse.com.au  Fact Sheet for Healthcare Providers: IncredibleEmployment.be  This test is not yet approved or cleared by the [place] FDA and has been authorized for detection and/or diagnosis of SARS-CoV-2 by FDA under an Emergency Use Authorization (EUA). This EUA will remain in effect (meaning this test can be used) for the duration of the COVID-19 declaration under Section 564(b)(1) of the Act, 21  U.S.C. section 360bbb-3(b)(1), unless the authorization is terminated or revoked.  Performed at [facility], [place] [address], [place], [place] [address]   Urine Culture     Status: Abnormal   Collection Time: 01/01/22  8:43 AM   Specimen: Urine, Clean Catch  Result Value Ref Range Status   Specimen Description URINE, CLEAN CATCH  Final   Special Requests   Final    NONE Performed at [facility], [place] [address], [place], [place] [address]    Culture (A)  Final    >=100,000 COLONIES/mL MULTIPLE SPECIES PRESENT, SUGGEST RECOLLECTION   Report Status 01/02/2022 FINAL  Final  Culture, blood (routine x 2)     Status: None (Preliminary result)   Collection Time: 01/01/22 11:52 AM   Specimen: BLOOD  Result Value Ref Range Status   Specimen Description BLOOD BLOOD RIGHT ARM  Final   Special Requests   Final    BOTTLES DRAWN AEROBIC AND ANAEROBIC Blood Culture results may not be optimal due to an inadequate volume  of blood received in culture bottles   Culture   Final    NO GROWTH < 24 HOURS Performed at [facility], [place] [address] [place], [place] [address]    Report Status PENDING  Incomplete         Radiology Studies: CT HEAD WO CONTRAST (5MM)  Result Date: 01/01/2022 CLINICAL DATA:  Nonspecific dizziness with slight headache. On Eliquis. EXAM: CT HEAD WITHOUT CONTRAST TECHNIQUE: Contiguous axial images were obtained from the base of the skull through the vertex without intravenous contrast. RADIATION DOSE REDUCTION: This exam was performed according to the departmental dose-optimization program which includes automated exposure control, adjustment of the mA and/or kV according to patient size and/or use of iterative reconstruction technique. COMPARISON:  Sixteen days ago FINDINGS: Brain: No evidence of acute infarction, hemorrhage, hydrocephalus, extra-axial collection or mass lesion/mass effect. Dilated perivascular space below the left putamen. Notable  cerebellar volume loss for age. Vascular: No hyperdense vessel or unexpected calcification. Skull: Normal. Negative for fracture or focal lesion. Sinuses/Orbits: No acute finding. IMPRESSION: No acute or interval finding. Electronically Signed   By: Jorje Guild M.D.   On: 01/01/2022 06:11   DG Chest Portable 1 View  Result Date: 01/01/2022 CLINICAL DATA:  61 year old female with history of dizziness. EXAM: PORTABLE CHEST 1 VIEW COMPARISON:  Chest x-ray 06/15/2021. FINDINGS: Lung volumes are low. No consolidative airspace disease. No pleural effusions. No pneumothorax. No pulmonary nodule or mass noted. Pulmonary vasculature and the cardiomediastinal silhouette are within normal limits. Left-sided pacemaker device in place with lead tips projecting over the expected location of the right atrium and right ventricle. Right internal jugular single-lumen power porta cath with tip terminating in the mid superior vena cava. IMPRESSION: 1. Low lung volumes without radiographic evidence of acute cardiopulmonary disease. Electronically Signed   By: Vinnie Langton M.D.   On: 01/01/2022 05:39        Scheduled Meds:  apixaban  5 mg Oral BID   feeding supplement (GLUCERNA SHAKE)  237 mL Oral TID BM   sodium chloride flush  3 mL Intravenous Q12H   Continuous Infusions:  cefTRIAXone (ROCEPHIN)  IV Stopped (01/01/22 1209)   dextrose 5 % and 0.9% NaCl 75 mL/hr at 01/02/22 0909          Aline August, MD [facility] 01/02/2022, 10:44 AM

## 2022-01-03 ENCOUNTER — Other Ambulatory Visit: Payer: Self-pay | Admitting: Hematology

## 2022-01-03 DIAGNOSIS — A419 Sepsis, unspecified organism: Secondary | ICD-10-CM

## 2022-01-03 DIAGNOSIS — C787 Secondary malignant neoplasm of liver and intrahepatic bile duct: Secondary | ICD-10-CM

## 2022-01-03 LAB — GLUCOSE, CAPILLARY
Glucose-Capillary: 179 mg/dL — ABNORMAL HIGH (ref 70–99)
Glucose-Capillary: 180 mg/dL — ABNORMAL HIGH (ref 70–99)
Glucose-Capillary: 188 mg/dL — ABNORMAL HIGH (ref 70–99)
Glucose-Capillary: 210 mg/dL — ABNORMAL HIGH (ref 70–99)

## 2022-01-03 LAB — COMPREHENSIVE METABOLIC PANEL
ALT: 9 U/L (ref 0–44)
AST: 16 U/L (ref 15–41)
Albumin: <1.5 g/dL — ABNORMAL LOW (ref 3.5–5.0)
Alkaline Phosphatase: 80 U/L (ref 38–126)
Anion gap: 5 (ref 5–15)
BUN: 17 mg/dL (ref 6–20)
CO2: 24 mmol/L (ref 22–32)
Calcium: 6.9 mg/dL — ABNORMAL LOW (ref 8.9–10.3)
Chloride: 105 mmol/L (ref 98–111)
Creatinine, Ser: 1.45 mg/dL — ABNORMAL HIGH (ref 0.44–1.00)
GFR, Estimated: 42 mL/min — ABNORMAL LOW (ref >60)
Glucose, Bld: 196 mg/dL — ABNORMAL HIGH (ref 70–99)
Potassium: 4.3 mmol/L (ref 3.5–5.1)
Sodium: 134 mmol/L — ABNORMAL LOW (ref 135–145)
Total Bilirubin: 0.2 mg/dL — ABNORMAL LOW (ref 0.3–1.2)
Total Protein: 5.1 g/dL — ABNORMAL LOW (ref 6.5–8.1)

## 2022-01-03 LAB — CBC WITH DIFFERENTIAL/PLATELET
Abs Immature Granulocytes: 0.09 10*3/uL — ABNORMAL HIGH (ref 0.00–0.07)
Basophils Absolute: 0 10*3/uL (ref 0.0–0.1)
Basophils Relative: 0 %
Eosinophils Absolute: 0.2 10*3/uL (ref 0.0–0.5)
Eosinophils Relative: 2 %
HCT: 23.6 % — ABNORMAL LOW (ref 36.0–46.0)
Hemoglobin: 7.3 g/dL — ABNORMAL LOW (ref 12.0–15.0)
Immature Granulocytes: 1 %
Lymphocytes Relative: 14 %
Lymphs Abs: 1.3 10*3/uL (ref 0.7–4.0)
MCH: 28.2 pg (ref 26.0–34.0)
MCHC: 30.9 g/dL (ref 30.0–36.0)
MCV: 91.1 fL (ref 80.0–100.0)
Monocytes Absolute: 0.2 10*3/uL (ref 0.1–1.0)
Monocytes Relative: 2 %
Neutro Abs: 7.7 10*3/uL (ref 1.7–7.7)
Neutrophils Relative %: 81 %
Platelets: 208 10*3/uL (ref 150–400)
RBC: 2.59 MIL/uL — ABNORMAL LOW (ref 3.87–5.11)
RDW: 19.9 % — ABNORMAL HIGH (ref 11.5–15.5)
WBC: 9.5 10*3/uL (ref 4.0–10.5)
nRBC: 0 % (ref 0.0–0.2)

## 2022-01-03 LAB — MAGNESIUM: Magnesium: 1.8 mg/dL (ref 1.7–2.4)

## 2022-01-03 NOTE — Progress Notes (Signed)
Patient ID: Leslie Mullen, female   DOB: 04/29/1961, 61 y.o.   MRN: [id number]  PROGRESS NOTE    Leslie Mullen  NWG:[id number] DOB: [birth year] DOA: 01/01/2022 PCP: Elinor Parkinson   Brief Narrative:  61 y.o. female with medical history significant of hypertension, complete heart block s/p Medtronic PPM, DM type II, metastatic pancreatic cancer currently undergoing chemotherapy, endometrial cancer, morbid obesity, recent hospitalization from 12/16/2021-12/20/2021 for symptomatic anemia in the setting of chemotherapy/metastatic pancreatic cancer requiring 4 units of packed red cell transfusion; recent port site dehiscence for which outpatient port removal and new port placement was being planned.  She presented on 01/01/2022 with dizziness and fall.  On presentation, she was tachycardic, febrile, tachypneic with leukocytosis, creatinine of 1.43, glucose of 35 and lactic acid of 2.2.  CT of the head was negative for any acute intracranial abnormity.  Chest x-ray was negative for any acute infiltrates.  She was started on intravenous antibiotics and fluids.  Oncology was consulted.  She underwent port removal by IR on 01/02/2022.  Assessment & Plan:   Severe sepsis: Present on admission Possible Port-A-Cath site dehiscence and infection Doubt that patient had UTI Lactic acidosis -Patient presented with dizziness and fall and was found to be febrile, tachycardic, tachypneic with leukocytosis, acute kidney injury with lactic acidosis with concern for Port-A-Cath site dehiscence/infection -Currently on Rocephin and vancomycin.  Blood cultures negative so far.  Urine cultures showing multiple species. -Switch IV fluids to D5 normal saline at 75 cc an hour. -Blood pressure currently stable.  Temperature max of 102.2 F over the last 24 hours -Status post Port-A-Cath removal on 01/02/2022 by IR.  New Port-A-Cath placement can be done as an outpatient.  Acute kidney injury -Baseline creatinine around 1.08.   Presented with creatinine of 1.43.  Creatinine 1.45 today.  IV fluids plan as above.  Leukocytosis -Improving but still significant.  Repeat a.m. labs.  Hypoglycemia in a patient with diabetes mellitus type 2 -Switch IV fluids as above.  Blood sugars improving.  Monitor blood sugars.  Home medications including Tresiba on hold.  Metastatic pancreatic cancer -Currently undergoing chemotherapy.  Oncology evaluation appreciated.  Chemotherapy to remain on hold for now and patient is to follow-up with oncology as an outpatient for resumption.  Port-A-Cath plan as above  Fall secondary to dizziness -Possibly from all of the above.  PT recommended home health PT.  No chest pain or palpitations.  High-sensitivity troponins were negative x2. -Fall precautions.  Paroxysmal A. fib on chronic anticoagulation -Currently rate controlled.  Continue Eliquis  History of complete heart block status post Medtronic PPM -Outpatient follow-up with EP  Essential hypertension -Blood pressure currently stable.  Amlodipine on hold  Anxiety -Continue lorazepam as needed  Hypoalbuminemia -Possibly from poor oral intake.  Consult nutrition  Anemia of chronic disease: From cancer and chemotherapy History of present GI bleed requiring 4 unit packed red cells transfusion -Hemoglobin 7.3 today.  Transfuse if hemoglobin is less than 7.  Morbid obesity -Outpatient follow-up  DVT prophylaxis: Eliquis Code Status: Full Family Communication: Husband at bedside Disposition Plan: Status is: inpatient because: Of severity of illness Consultants: Oncology/IR  Procedures: Port-A-Cath removal on 01/02/2022 by IR Antimicrobials:  Anti-infectives (From admission, onward)    Start     Dose/Rate Route Frequency Ordered Stop   01/03/22 1300  vancomycin (VANCOREADY) IVPB 1250 mg/250 mL       See Hyperspace for full Linked Orders Report.   1,250 mg 166.7 mL/hr over 90 Minutes Intravenous  Every 24 hours 01/02/22  1151     01/02/22 1300  vancomycin (VANCOCIN) 2,500 mg in sodium chloride 0.9 % 500 mL IVPB       See Hyperspace for full Linked Orders Report.   2,500 mg 250 mL/hr over 120 Minutes Intravenous  Once 01/02/22 1151 01/02/22 1429   01/01/22 1200  cefTRIAXone (ROCEPHIN) 1 g in sodium chloride 0.9 % 100 mL IVPB        1 g 200 mL/hr over 30 Minutes Intravenous Every 24 hours 01/01/22 1125     01/01/22 1130  cefTRIAXone (ROCEPHIN) 1 g in sodium chloride 0.9 % 100 mL IVPB  Status:  Discontinued        1 g 200 mL/hr over 30 Minutes Intravenous  Once 01/01/22 1123 01/01/22 1125        Subjective: Patient seen and examined at bedside.  Had temperature spike yesterday with chills.  Still feels weak.  Denies worsening shortness of breath or cough.  No chest pain, vomiting or worsening abdominal pain reported.  Objective: Vitals:   01/02/22 1935 01/02/22 2357 01/03/22 0411 01/03/22 0500  BP: 111/63 117/70 111/62   Pulse: 96 92 86   Resp: 18 16 17    Temp: 100 F (37.8 C) 98.9 F (37.2 C) 98.5 F (36.9 C)   TempSrc: Oral Oral Oral   SpO2: 95% 97% 96%   Weight:    (!) 138 kg  Height:        Intake/Output Summary (Last 24 hours) at 01/03/2022 0724 Last data filed at 01/03/2022 0500 Gross per 24 hour  Intake 1901.51 ml  Output 450 ml  Net 1451.51 ml    Filed Weights   01/01/22 0446 01/03/22 0500  Weight: 136.1 kg (!) 138 kg    Examination:  General exam: On room air.  No acute distress. Respiratory system: Decreased breath sounds at bases bilaterally with some crackles Cardiovascular system: Currently rate controlled; S1-S2 heard gastrointestinal system: Abdomen is morbidly obese, distended slightly; soft and nontender.  Bowel sounds are heard  extremities: Mild lower extremity edema present; no clubbing  Central nervous system: Awake and alert.  No focal neurological deficits.  Moves extremities Skin: No obvious ecchymosis/lesions  psychiatry: Mood, affect and judgment are  normal   Data Reviewed: I have personally reviewed following labs and imaging studies  CBC: Recent Labs  Lab 12/29/21 1055 01/01/22 0812 01/02/22 0127 01/03/22 0301  WBC 12.8* 24.9* 17.7* 9.5  NEUTROABS 10.3* 23.8* 15.6* 7.7  HGB 11.2* 12.0 8.9* 7.3*  HCT 35.4* 38.8 27.9* 23.6*  MCV 88.9 91.1 90.0 91.1  PLT 355 397 228 161    Basic Metabolic Panel: Recent Labs  Lab 12/29/21 1055 01/01/22 0812 01/02/22 0127 01/03/22 0301  NA 137 139 134* 134*  K 4.5 4.0 4.1 4.3  CL 102 103 102 105  CO2 26 25 25 24   GLUCOSE 238* 68* 91 196*  BUN 11 16 17 17   CREATININE 1.08* 1.43* 1.56* 1.45*  CALCIUM 8.1* 8.4* 7.4* 6.9*  MG  --   --   --  1.8    GFR: Estimated Creatinine Clearance: 61.7 mL/min (A) (by C-G formula based on SCr of 1.45 mg/dL (H)). Liver Function Tests: Recent Labs  Lab 12/29/21 1055 01/01/22 0812 01/03/22 0301  AST 17 23 16   ALT 8 11 9   ALKPHOS 100 109 80  BILITOT 0.4 0.5 0.2*  PROT 6.7 7.7 5.1*  ALBUMIN 2.4* 1.9* <1.5*    Recent Labs  Lab 01/01/22 [phone]  LIPASE 23    No results for input(s): AMMONIA in the last 168 hours. Coagulation Profile: Recent Labs  Lab 01/01/22 0812  INR 1.3*    Cardiac Enzymes: Recent Labs  Lab 01/02/22 0127  CKTOTAL 82    BNP (last 3 results) No results for input(s): PROBNP in the last 8760 hours. HbA1C: No results for input(s): HGBA1C in the last 72 hours. CBG: Recent Labs  Lab 01/02/22 1124 01/02/22 1540 01/02/22 2036 01/03/22 0002 01/03/22 0414  GLUCAP 95 88 163* 179* 180*    Lipid Profile: No results for input(s): CHOL, HDL, LDLCALC, TRIG, CHOLHDL, LDLDIRECT in the last 72 hours. Thyroid Function Tests: Recent Labs    01/01/22 0914  TSH 1.701    Anemia Panel: No results for input(s): VITAMINB12, FOLATE, FERRITIN, TIBC, IRON, RETICCTPCT in the last 72 hours. Sepsis Labs: Recent Labs  Lab 01/01/22 0914 01/01/22 1206  LATICACIDVEN 2.2* 1.5     Recent Results (from the past 240  hour(s))  Resp Panel by RT-PCR (Flu A&B, Covid) Nasopharyngeal Swab     Status: None   Collection Time: 01/01/22  8:16 AM   Specimen: Nasopharyngeal Swab; Nasopharyngeal(NP) swabs in vial transport medium  Result Value Ref Range Status   SARS Coronavirus 2 by RT PCR NEGATIVE NEGATIVE Final    Comment: (NOTE) SARS-CoV-2 target nucleic acids are NOT DETECTED.  The SARS-CoV-2 RNA is generally detectable in upper respiratory specimens during the acute phase of infection. The lowest concentration of SARS-CoV-2 viral copies this assay can detect is 138 copies/mL. A negative result does not preclude SARS-Cov-2 infection and should not be used as the sole basis for treatment or other patient management decisions. A negative result may occur with  improper specimen collection/handling, submission of specimen other than nasopharyngeal swab, presence of viral mutation(s) within the areas targeted by this assay, and inadequate number of viral copies(<138 copies/mL). A negative result must be combined with clinical observations, patient history, and epidemiological information. The expected result is Negative.  Fact Sheet for Patients:  EntrepreneurPulse.com.au  Fact Sheet for Healthcare Providers:  IncredibleEmployment.be  This test is no t yet approved or cleared by the [place] FDA and  has been authorized for detection and/or diagnosis of SARS-CoV-2 by FDA under an Emergency Use Authorization (EUA). This EUA will remain  in effect (meaning this test can be used) for the duration of the COVID-19 declaration under Section 564(b)(1) of the Act, 21 U.S.C.section 360bbb-3(b)(1), unless the authorization is terminated  or revoked sooner.       Influenza A by PCR NEGATIVE NEGATIVE Final   Influenza B by PCR NEGATIVE NEGATIVE Final    Comment: (NOTE) The Xpert Xpress SARS-CoV-2/FLU/RSV plus assay is intended as an aid in the diagnosis of influenza from  Nasopharyngeal swab specimens and should not be used as a sole basis for treatment. Nasal washings and aspirates are unacceptable for Xpert Xpress SARS-CoV-2/FLU/RSV testing.  Fact Sheet for Patients: EntrepreneurPulse.com.au  Fact Sheet for Healthcare Providers: IncredibleEmployment.be  This test is not yet approved or cleared by the [place] FDA and has been authorized for detection and/or diagnosis of SARS-CoV-2 by FDA under an Emergency Use Authorization (EUA). This EUA will remain in effect (meaning this test can be used) for the duration of the COVID-19 declaration under Section 564(b)(1) of the Act, 21 U.S.C. section 360bbb-3(b)(1), unless the authorization is terminated or revoked.  Performed at [facility], [place] [address] [place], [place] [address]   Urine Culture  Status: Abnormal   Collection Time: 01/01/22  8:43 AM   Specimen: Urine, Clean Catch  Result Value Ref Range Status   Specimen Description URINE, CLEAN CATCH  Final   Special Requests   Final    NONE Performed at [facility], [address], [place], [place] [address]    Culture (A)  Final    >=100,000 COLONIES/mL MULTIPLE SPECIES PRESENT, SUGGEST RECOLLECTION   Report Status 01/02/2022 FINAL  Final  Culture, blood (routine x 2)     Status: None (Preliminary result)   Collection Time: 01/01/22 11:52 AM   Specimen: BLOOD  Result Value Ref Range Status   Specimen Description BLOOD BLOOD RIGHT ARM  Final   Special Requests   Final    BOTTLES DRAWN AEROBIC AND ANAEROBIC Blood Culture results may not be optimal due to an inadequate volume of blood received in culture bottles   Culture   Final    NO GROWTH < 24 HOURS Performed at [facility], [place] [address], [place], [place] [address]    Report Status PENDING  Incomplete          Radiology Studies: IR REMOVAL TUN ACCESS W/ PORT W/O FL MOD SED  Result Date: 01/02/2022 INDICATION:  61 year old with pancreatic cancer. Port-A-Cath was placed on 11/11/2021. Partial incision dehiscence was noted and there is concern for port infection. Plan for port removal. EXAM: REMOVAL RIGHT IJ VEIN PORT-A-CATH MEDICATIONS: Moderate sedation ANESTHESIA/SEDATION: Moderate (conscious) sedation was employed during this procedure. A total of Versed 2.0mg  and fentanyl 100 mcg was administered intravenously at the order of the provider performing the procedure. Total intra-service moderate sedation time: 23 minutes. Patient's level of consciousness and vital signs were monitored continuously by radiology nurse throughout the procedure under the supervision of the provider performing the procedure. FLUOROSCOPY TIME:  None COMPLICATIONS: None immediate. PROCEDURE: Informed written consent was obtained from the patient after a thorough discussion of the procedural risks, benefits and alternatives. All questions were addressed. Maximal Sterile Barrier Technique was utilized including caps, mask, sterile gowns, sterile gloves, sterile drape, hand hygiene and skin antiseptic. A timeout was performed prior to the initiation of the procedure. The right chest was prepped and draped in a sterile fashion. Lidocaine with epinephrine was utilized for local anesthesia. An incision was made over the port incision. Utilizing blunt dissection, the port catheter and reservoir were removed from the underlying subcutaneous tissue in their entirety. The pocket was irrigated with a copious amount of sterile normal saline. Pocket was closed using 3 interrupted 2-0 Ethilon sutures. Dressing was placed over the incision. FINDINGS: The port incision was almost completely closed. There was small amount of dry blood or scab formation along the medial aspect of the incision. No significant erythema around the port. Incision was made over the old incision. No evidence for purulent drainage. Small amount of bloody fluid was removed from the  pocket. Inspection of the port pocket demonstrated normal healthy tissue. Pocket was rigorously irrigated with saline. Incision was closed using interrupted sutures. IMPRESSION: Successful right IJ vein Port-A-Cath explant. Patient has interrupted sutures that will be removed in approximately 2 weeks. Electronically Signed   By: Markus Daft M.D.   On: 01/02/2022 14:19        Scheduled Meds:  apixaban  5 mg Oral BID   feeding supplement (GLUCERNA SHAKE)  237 mL Oral TID BM   mirtazapine  15 mg Oral QHS   multivitamin with minerals  1 tablet Oral Daily  sodium chloride flush  3 mL Intravenous Q12H   Continuous Infusions:  cefTRIAXone (ROCEPHIN)  IV 1 g (01/02/22 1536)   dextrose 5 % and 0.9% NaCl 125 mL/hr at 01/03/22 0500   vancomycin            Aline August, MD [facility] 01/03/2022, 7:24 AM

## 2022-01-04 LAB — BASIC METABOLIC PANEL
Anion gap: 5 (ref 5–15)
BUN: 13 mg/dL (ref 6–20)
CO2: 22 mmol/L (ref 22–32)
Calcium: 7.1 mg/dL — ABNORMAL LOW (ref 8.9–10.3)
Chloride: 111 mmol/L (ref 98–111)
Creatinine, Ser: 1.28 mg/dL — ABNORMAL HIGH (ref 0.44–1.00)
GFR, Estimated: 48 mL/min — ABNORMAL LOW (ref >60)
Glucose, Bld: 178 mg/dL — ABNORMAL HIGH (ref 70–99)
Potassium: 3.9 mmol/L (ref 3.5–5.1)
Sodium: 138 mmol/L (ref 135–145)

## 2022-01-04 LAB — CBC WITH DIFFERENTIAL/PLATELET
Abs Immature Granulocytes: 0.02 10*3/uL (ref 0.00–0.07)
Basophils Absolute: 0 10*3/uL (ref 0.0–0.1)
Basophils Relative: 1 %
Eosinophils Absolute: 0.2 10*3/uL (ref 0.0–0.5)
Eosinophils Relative: 4 %
HCT: 25.9 % — ABNORMAL LOW (ref 36.0–46.0)
Hemoglobin: 8.3 g/dL — ABNORMAL LOW (ref 12.0–15.0)
Immature Granulocytes: 0 %
Lymphocytes Relative: 28 %
Lymphs Abs: 1.4 10*3/uL (ref 0.7–4.0)
MCH: 29.1 pg (ref 26.0–34.0)
MCHC: 32 g/dL (ref 30.0–36.0)
MCV: 90.9 fL (ref 80.0–100.0)
Monocytes Absolute: 0.3 10*3/uL (ref 0.1–1.0)
Monocytes Relative: 7 %
Neutro Abs: 3.1 10*3/uL (ref 1.7–7.7)
Neutrophils Relative %: 60 %
Platelets: 180 10*3/uL (ref 150–400)
RBC: 2.85 MIL/uL — ABNORMAL LOW (ref 3.87–5.11)
RDW: 19.7 % — ABNORMAL HIGH (ref 11.5–15.5)
WBC: 5.1 10*3/uL (ref 4.0–10.5)
nRBC: 0 % (ref 0.0–0.2)

## 2022-01-04 LAB — GLUCOSE, CAPILLARY
Glucose-Capillary: 169 mg/dL — ABNORMAL HIGH (ref 70–99)
Glucose-Capillary: 169 mg/dL — ABNORMAL HIGH (ref 70–99)
Glucose-Capillary: 173 mg/dL — ABNORMAL HIGH (ref 70–99)
Glucose-Capillary: 173 mg/dL — ABNORMAL HIGH (ref 70–99)
Glucose-Capillary: 174 mg/dL — ABNORMAL HIGH (ref 70–99)
Glucose-Capillary: 177 mg/dL — ABNORMAL HIGH (ref 70–99)
Glucose-Capillary: 184 mg/dL — ABNORMAL HIGH (ref 70–99)

## 2022-01-04 LAB — MAGNESIUM: Magnesium: 1.9 mg/dL (ref 1.7–2.4)

## 2022-01-04 MED ORDER — BISACODYL 10 MG RE SUPP: 10.0000 mg | Freq: Every day | RECTAL | Status: DC | PRN

## 2022-01-04 MED ORDER — ALUM & MAG HYDROXIDE-SIMETH 200-200-20 MG/5ML PO SUSP
15.0000 mL | Freq: Four times a day (QID) | ORAL | Status: DC | PRN
  Administered 2022-01-04: 15 mL via ORAL
  Filled 2022-01-04: qty 30

## 2022-01-04 MED ORDER — POLYETHYLENE GLYCOL 3350 17 G PO PACK
17.0000 g | PACK | Freq: Every day | ORAL | Status: DC | PRN
  Administered 2022-01-04: 17 g via ORAL
  Filled 2022-01-04: qty 1

## 2022-01-04 MED ORDER — AMLODIPINE BESYLATE 10 MG PO TABS
10.0000 mg | ORAL_TABLET | Freq: Every day | ORAL | Status: DC
  Administered 2022-01-04 – 2022-01-05 (×2): 10 mg via ORAL
  Filled 2022-01-04 (×2): qty 1

## 2022-01-04 MED ORDER — PANTOPRAZOLE SODIUM 40 MG PO TBEC
40.0000 mg | DELAYED_RELEASE_TABLET | Freq: Every day | ORAL | Status: DC
  Administered 2022-01-04 – 2022-01-05 (×2): 40 mg via ORAL
  Filled 2022-01-04 (×2): qty 1

## 2022-01-04 MED ORDER — GABAPENTIN 300 MG PO CAPS
300.0000 mg | ORAL_CAPSULE | Freq: Two times a day (BID) | ORAL | Status: DC
  Administered 2022-01-04 – 2022-01-05 (×3): 300 mg via ORAL
  Filled 2022-01-04 (×3): qty 1

## 2022-01-04 MED ORDER — SENNOSIDES-DOCUSATE SODIUM 8.6-50 MG PO TABS
1.0000 | ORAL_TABLET | Freq: Two times a day (BID) | ORAL | Status: DC
  Administered 2022-01-04 – 2022-01-05 (×3): 1 via ORAL
  Filled 2022-01-04 (×3): qty 1

## 2022-01-04 MED ORDER — PRAVASTATIN SODIUM 40 MG PO TABS
40.0000 mg | ORAL_TABLET | Freq: Every day | ORAL | Status: DC
  Administered 2022-01-04: 40 mg via ORAL
  Filled 2022-01-04: qty 1

## 2022-01-04 NOTE — Progress Notes (Signed)
Patient ID: Starlee Corralejo, female   DOB: July 31, 1961, 61 y.o.   MRN: [id number]  PROGRESS NOTE    Chloeanne Poteet  JYN:[id number] DOB: 1961-02-20 DOA: 01/01/2022 PCP: Elinor Parkinson   Brief Narrative:  61 y.o. female with medical history significant of hypertension, complete heart block s/p Medtronic PPM, DM type II, metastatic pancreatic cancer currently undergoing chemotherapy, endometrial cancer, morbid obesity, recent hospitalization from 12/16/2021-12/20/2021 for symptomatic anemia in the setting of chemotherapy/metastatic pancreatic cancer requiring 4 units of packed red cell transfusion; recent port site dehiscence for which outpatient port removal and new port placement was being planned.  She presented on 01/01/2022 with dizziness and fall.  On presentation, she was tachycardic, febrile, tachypneic with leukocytosis, creatinine of 1.43, glucose of 35 and lactic acid of 2.2.  CT of the head was negative for any acute intracranial abnormity.  Chest x-ray was negative for any acute infiltrates.  She was started on intravenous antibiotics and fluids.  Oncology was consulted.  She underwent port removal by IR on 01/02/2022.  Assessment & Plan:   Severe sepsis: Present on admission Possible Port-A-Cath site dehiscence and infection Doubt that patient had UTI Lactic acidosis -Patient presented with dizziness and fall and was found to be febrile, tachycardic, tachypneic with leukocytosis, acute kidney injury with lactic acidosis with concern for Port-A-Cath site dehiscence/infection -Currently on Rocephin and vancomycin.  Blood cultures negative so far.  Urine cultures showing multiple species. -No temperature spike of the last 4 hours.  Hemodynamically currently stable.  DC IV fluids.  If she remains afebrile in the next 24 hours, will consider discharging home. -Status post Port-A-Cath removal on 01/02/2022 by IR.  New Port-A-Cath placement can be done as an outpatient.  Acute kidney  injury -Baseline creatinine around 1.08.  Presented with creatinine of 1.43.  Creatinine 1.28 today.  IV fluids plan as above.  Leukocytosis -Resolved  Hypoglycemia in a patient with diabetes mellitus type 2 -Blood sugars much improved and currently on the higher side.  DC IV fluids as above.  Monitor blood sugars.  Home medications including Tresiba on hold.  Metastatic pancreatic cancer -Currently undergoing chemotherapy.  Oncology evaluation appreciated.  Chemotherapy to remain on hold for now and patient is to follow-up with oncology as an outpatient for resumption.  Port-A-Cath plan as above  Fall secondary to dizziness -Possibly from all of the above.  PT recommended home health PT.  No chest pain or palpitations.  High-sensitivity troponins were negative x2. -Fall precautions.  Paroxysmal A. fib on chronic anticoagulation -Currently rate controlled.  Continue Eliquis  History of complete heart block status post Medtronic PPM -Outpatient follow-up with EP  Essential hypertension -Blood pressure currently stable.  Amlodipine on hold  Anxiety -Continue lorazepam as needed  Hypoalbuminemia -Possibly from poor oral intake.  Consult nutrition  Anemia of chronic disease: From cancer and chemotherapy History of present GI bleed requiring 4 unit packed red cells transfusion -Hemoglobin 8.3 today.  Transfuse if hemoglobin is less than 7.  Morbid obesity -Outpatient follow-up  DVT prophylaxis: Eliquis Code Status: Full Family Communication: Husband at bedside on 01/02/2022 Disposition Plan: Status is: inpatient because: Of severity of illness Consultants: Oncology/IR  Procedures: Port-A-Cath removal on 01/02/2022 by IR Antimicrobials:  Anti-infectives (From admission, onward)    Start     Dose/Rate Route Frequency Ordered Stop   01/03/22 1300  vancomycin (VANCOREADY) IVPB 1250 mg/250 mL       See Hyperspace for full Linked Orders Report.   1,250 mg 166.7  mL/hr over 90  Minutes Intravenous Every 24 hours 01/02/22 1151     01/02/22 1300  vancomycin (VANCOCIN) 2,500 mg in sodium chloride 0.9 % 500 mL IVPB       See Hyperspace for full Linked Orders Report.   2,500 mg 250 mL/hr over 120 Minutes Intravenous  Once 01/02/22 1151 01/02/22 1429   01/01/22 1200  cefTRIAXone (ROCEPHIN) 1 g in sodium chloride 0.9 % 100 mL IVPB        1 g 200 mL/hr over 30 Minutes Intravenous Every 24 hours 01/01/22 1125     01/01/22 1130  cefTRIAXone (ROCEPHIN) 1 g in sodium chloride 0.9 % 100 mL IVPB  Status:  Discontinued        1 g 200 mL/hr over 30 Minutes Intravenous  Once 01/01/22 1123 01/01/22 1125        Subjective: Patient seen and examined at bedside.  Feels slightly better but still feels weak.  Denies worsening shortness of breath, fever or vomiting.  Has some intermittent nausea.  Objective: Vitals:   01/03/22 2016 01/04/22 0009 01/04/22 0421 01/04/22 0426  BP: 127/69 129/66 130/68   Pulse: 84 89 85   Resp: 16 17 15    Temp: 98.4 F (36.9 C) 98.1 F (36.7 C) 98.7 F (37.1 C)   TempSrc: Oral Oral Oral   SpO2: 95% 96% 97%   Weight:    (!) 137.8 kg  Height:        Intake/Output Summary (Last 24 hours) at 01/04/2022 0732 Last data filed at 01/04/2022 0500 Gross per 24 hour  Intake 2336.97 ml  Output 1870 ml  Net 466.97 ml    Filed Weights   01/01/22 0446 01/03/22 0500 01/04/22 0426  Weight: 136.1 kg (!) 138 kg (!) 137.8 kg    Examination:  General exam: No distress.  Currently on room air.   Respiratory system: Bilateral decreased air sounds at bases with scattered crackles  cardiovascular system: S1-S2 heard, rate controlled  gastrointestinal system: Abdomen is morbidly obese, mildly distended; soft and nontender.  Normal bowel sounds heard extremities: No cyanosis; trace lower extremity edema present Central nervous system: Awake and oriented.  No focal neurological deficits.  Moving extremities Skin: No obvious petechiae/rashes psychiatry:  Judgment, affect and mood are normal  Data Reviewed: I have personally reviewed following labs and imaging studies  CBC: Recent Labs  Lab 12/29/21 1055 01/01/22 0812 01/02/22 0127 01/03/22 0301 01/04/22 0414  WBC 12.8* 24.9* 17.7* 9.5 5.1  NEUTROABS 10.3* 23.8* 15.6* 7.7 3.1  HGB 11.2* 12.0 8.9* 7.3* 8.3*  HCT 35.4* 38.8 27.9* 23.6* 25.9*  MCV 88.9 91.1 90.0 91.1 90.9  PLT 355 397 228 208 937    Basic Metabolic Panel: Recent Labs  Lab 12/29/21 1055 01/01/22 0812 01/02/22 0127 01/03/22 0301 01/04/22 0414  NA 137 139 134* 134* 138  K 4.5 4.0 4.1 4.3 3.9  CL 102 103 102 105 111  CO2 26 25 25 24 22   GLUCOSE 238* 68* 91 196* 178*  BUN 11 16 17 17 13   CREATININE 1.08* 1.43* 1.56* 1.45* 1.28*  CALCIUM 8.1* 8.4* 7.4* 6.9* 7.1*  MG  --   --   --  1.8 1.9    GFR: Estimated Creatinine Clearance: 69.9 mL/min (A) (by C-G formula based on SCr of 1.28 mg/dL (H)). Liver Function Tests: Recent Labs  Lab 12/29/21 1055 01/01/22 0812 01/03/22 0301  AST 17 23 16   ALT 8 11 9   ALKPHOS 100 109 80  BILITOT 0.4 0.5  0.2*  PROT 6.7 7.7 5.1*  ALBUMIN 2.4* 1.9* <1.5*    Recent Labs  Lab 01/01/22 0812  LIPASE 23    No results for input(s): AMMONIA in the last 168 hours. Coagulation Profile: Recent Labs  Lab 01/01/22 0812  INR 1.3*    Cardiac Enzymes: Recent Labs  Lab 01/02/22 0127  CKTOTAL 82    BNP (last 3 results) No results for input(s): PROBNP in the last 8760 hours. HbA1C: No results for input(s): HGBA1C in the last 72 hours. CBG: Recent Labs  Lab 01/03/22 0414 01/03/22 1518 01/03/22 2018 01/04/22 0012 01/04/22 0419  GLUCAP 180* 210* 188* 177* 174*    Lipid Profile: No results for input(s): CHOL, HDL, LDLCALC, TRIG, CHOLHDL, LDLDIRECT in the last 72 hours. Thyroid Function Tests: Recent Labs    01/01/22 0914  TSH 1.701    Anemia Panel: No results for input(s): VITAMINB12, FOLATE, FERRITIN, TIBC, IRON, RETICCTPCT in the last 72 hours. Sepsis  Labs: Recent Labs  Lab 01/01/22 0914 01/01/22 1206  LATICACIDVEN 2.2* 1.5     Recent Results (from the past 240 hour(s))  Resp Panel by RT-PCR (Flu A&B, Covid) Nasopharyngeal Swab     Status: None   Collection Time: 01/01/22  8:16 AM   Specimen: Nasopharyngeal Swab; Nasopharyngeal(NP) swabs in vial transport medium  Result Value Ref Range Status   SARS Coronavirus 2 by RT PCR NEGATIVE NEGATIVE Final    Comment: (NOTE) SARS-CoV-2 target nucleic acids are NOT DETECTED.  The SARS-CoV-2 RNA is generally detectable in upper respiratory specimens during the acute phase of infection. The lowest concentration of SARS-CoV-2 viral copies this assay can detect is 138 copies/mL. A negative result does not preclude SARS-Cov-2 infection and should not be used as the sole basis for treatment or other patient management decisions. A negative result may occur with  improper specimen collection/handling, submission of specimen other than nasopharyngeal swab, presence of viral mutation(s) within the areas targeted by this assay, and inadequate number of viral copies(<138 copies/mL). A negative result must be combined with clinical observations, patient history, and epidemiological information. The expected result is Negative.  Fact Sheet for Patients:  EntrepreneurPulse.com.au  Fact Sheet for Healthcare Providers:  IncredibleEmployment.be  This test is no t yet approved or cleared by the [place] FDA and  has been authorized for detection and/or diagnosis of SARS-CoV-2 by FDA under an Emergency Use Authorization (EUA). This EUA will remain  in effect (meaning this test can be used) for the duration of the COVID-19 declaration under Section 564(b)(1) of the Act, 21 U.S.C.section 360bbb-3(b)(1), unless the authorization is terminated  or revoked sooner.       Influenza A by PCR NEGATIVE NEGATIVE Final   Influenza B by PCR NEGATIVE NEGATIVE Final     Comment: (NOTE) The Xpert Xpress SARS-CoV-2/FLU/RSV plus assay is intended as an aid in the diagnosis of influenza from Nasopharyngeal swab specimens and should not be used as a sole basis for treatment. Nasal washings and aspirates are unacceptable for Xpert Xpress SARS-CoV-2/FLU/RSV testing.  Fact Sheet for Patients: EntrepreneurPulse.com.au  Fact Sheet for Healthcare Providers: IncredibleEmployment.be  This test is not yet approved or cleared by the [place] FDA and has been authorized for detection and/or diagnosis of SARS-CoV-2 by FDA under an Emergency Use Authorization (EUA). This EUA will remain in effect (meaning this test can be used) for the duration of the COVID-19 declaration under Section 564(b)(1) of the Act, 21 U.S.C. section 360bbb-3(b)(1), unless the authorization is terminated or  revoked.  Performed at [facility], [place] [address] [place], [place] [address]   Urine Culture     Status: Abnormal   Collection Time: 01/01/22  8:43 AM   Specimen: Urine, Clean Catch  Result Value Ref Range Status   Specimen Description URINE, CLEAN CATCH  Final   Special Requests   Final    NONE Performed at [facility], [place] [address] [place], [place] [address]    Culture (A)  Final    >=100,000 COLONIES/mL MULTIPLE SPECIES PRESENT, SUGGEST RECOLLECTION   Report Status 01/02/2022 FINAL  Final  Culture, blood (routine x 2)     Status: None (Preliminary result)   Collection Time: 01/01/22 11:52 AM   Specimen: BLOOD  Result Value Ref Range Status   Specimen Description BLOOD BLOOD RIGHT ARM  Final   Special Requests   Final    BOTTLES DRAWN AEROBIC AND ANAEROBIC Blood Culture results may not be optimal due to an inadequate volume of blood received in culture bottles   Culture   Final    NO GROWTH 2 DAYS Performed at [facility], [place] [address] [place], [place] [address]    Report Status PENDING  Incomplete   Culture, blood (routine x 2)     Status: None (Preliminary result)   Collection Time: 01/02/22  1:27 AM   Specimen: BLOOD  Result Value Ref Range Status   Specimen Description BLOOD SITE NOT SPECIFIED  Final   Special Requests   Final    AEROBIC BOTTLE ONLY Blood Culture results may not be optimal due to an inadequate volume of blood received in culture bottles   Culture   Final    NO GROWTH 1 DAY Performed at [facility], [place] [address] [place],  [address]    Report Status PENDING  Incomplete          Radiology Studies: IR REMOVAL TUN ACCESS W/ PORT W/O FL MOD SED  Result Date: 01/02/2022 INDICATION: 61 year old with pancreatic cancer. Port-A-Cath was placed on 11/11/2021. Partial incision dehiscence was noted and there is concern for port infection. Plan for port removal. EXAM: REMOVAL RIGHT IJ VEIN PORT-A-CATH MEDICATIONS: Moderate sedation ANESTHESIA/SEDATION: Moderate (conscious) sedation was employed during this procedure. A total of Versed 2.0mg  and fentanyl 100 mcg was administered intravenously at the order of the provider performing the procedure. Total intra-service moderate sedation time: 23 minutes. Patient's level of consciousness and vital signs were monitored continuously by radiology nurse throughout the procedure under the supervision of the provider performing the procedure. FLUOROSCOPY TIME:  None COMPLICATIONS: None immediate. PROCEDURE: Informed written consent was obtained from the patient after a thorough discussion of the procedural risks, benefits and alternatives. All questions were addressed. Maximal Sterile Barrier Technique was utilized including caps, mask, sterile gowns, sterile gloves, sterile drape, hand hygiene and skin antiseptic. A timeout was performed prior to the initiation of the procedure. The right chest was prepped and draped in a sterile fashion. Lidocaine with epinephrine was utilized for local anesthesia. An incision was made over  the port incision. Utilizing blunt dissection, the port catheter and reservoir were removed from the underlying subcutaneous tissue in their entirety. The pocket was irrigated with a copious amount of sterile normal saline. Pocket was closed using 3 interrupted 2-0 Ethilon sutures. Dressing was placed over the incision. FINDINGS: The port incision was almost completely closed. There was small amount of dry blood or scab formation along the medial aspect of the  incision. No significant erythema around the port. Incision was made over the old incision. No evidence for purulent drainage. Small amount of bloody fluid was removed from the pocket. Inspection of the port pocket demonstrated normal healthy tissue. Pocket was rigorously irrigated with saline. Incision was closed using interrupted sutures. IMPRESSION: Successful right IJ vein Port-A-Cath explant. Patient has interrupted sutures that will be removed in approximately 2 weeks. Electronically Signed   By: Markus Daft M.D.   On: 01/02/2022 14:19        Scheduled Meds:  apixaban  5 mg Oral BID   feeding supplement (GLUCERNA SHAKE)  237 mL Oral TID BM   mirtazapine  15 mg Oral QHS   multivitamin with minerals  1 tablet Oral Daily   sodium chloride flush  3 mL Intravenous Q12H   Continuous Infusions:  cefTRIAXone (ROCEPHIN)  IV 1 g (01/03/22 1347)   dextrose 5 % and 0.9% NaCl 75 mL/hr at 01/04/22 0500   vancomycin 1,250 mg (01/03/22 1609)          Aline August, MD [facility] 01/04/2022, 7:32 AM

## 2022-01-05 LAB — COMPREHENSIVE METABOLIC PANEL
ALT: 10 U/L (ref 0–44)
AST: 17 U/L (ref 15–41)
Albumin: <1.5 g/dL — ABNORMAL LOW (ref 3.5–5.0)
Alkaline Phosphatase: 82 U/L (ref 38–126)
Anion gap: 9 (ref 5–15)
BUN: 11 mg/dL (ref 6–20)
CO2: 24 mmol/L (ref 22–32)
Calcium: 7.7 mg/dL — ABNORMAL LOW (ref 8.9–10.3)
Chloride: 106 mmol/L (ref 98–111)
Creatinine, Ser: 1.2 mg/dL — ABNORMAL HIGH (ref 0.44–1.00)
GFR, Estimated: 52 mL/min — ABNORMAL LOW (ref >60)
Glucose, Bld: 175 mg/dL — ABNORMAL HIGH (ref 70–99)
Potassium: 3.8 mmol/L (ref 3.5–5.1)
Sodium: 139 mmol/L (ref 135–145)
Total Bilirubin: 0.2 mg/dL — ABNORMAL LOW (ref 0.3–1.2)
Total Protein: 6 g/dL — ABNORMAL LOW (ref 6.5–8.1)

## 2022-01-05 LAB — MAGNESIUM: Magnesium: 2 mg/dL (ref 1.7–2.4)

## 2022-01-05 LAB — CBC WITH DIFFERENTIAL/PLATELET
Abs Immature Granulocytes: 0.03 10*3/uL (ref 0.00–0.07)
Basophils Absolute: 0 10*3/uL (ref 0.0–0.1)
Basophils Relative: 0 %
Eosinophils Absolute: 0.1 10*3/uL (ref 0.0–0.5)
Eosinophils Relative: 1 %
HCT: 25.3 % — ABNORMAL LOW (ref 36.0–46.0)
Hemoglobin: 8 g/dL — ABNORMAL LOW (ref 12.0–15.0)
Immature Granulocytes: 0 %
Lymphocytes Relative: 26 %
Lymphs Abs: 1.8 10*3/uL (ref 0.7–4.0)
MCH: 29 pg (ref 26.0–34.0)
MCHC: 31.6 g/dL (ref 30.0–36.0)
MCV: 91.7 fL (ref 80.0–100.0)
Monocytes Absolute: 0.5 10*3/uL (ref 0.1–1.0)
Monocytes Relative: 8 %
Neutro Abs: 4.3 10*3/uL (ref 1.7–7.7)
Neutrophils Relative %: 65 %
Platelets: 184 10*3/uL (ref 150–400)
RBC: 2.76 MIL/uL — ABNORMAL LOW (ref 3.87–5.11)
RDW: 19.4 % — ABNORMAL HIGH (ref 11.5–15.5)
WBC: 6.7 10*3/uL (ref 4.0–10.5)
nRBC: 0 % (ref 0.0–0.2)

## 2022-01-05 LAB — GLUCOSE, CAPILLARY
Glucose-Capillary: 143 mg/dL — ABNORMAL HIGH (ref 70–99)
Glucose-Capillary: 153 mg/dL — ABNORMAL HIGH (ref 70–99)
Glucose-Capillary: 190 mg/dL — ABNORMAL HIGH (ref 70–99)

## 2022-01-05 MED ORDER — SENNOSIDES-DOCUSATE SODIUM 8.6-50 MG PO TABS: 1.0000 | ORAL_TABLET | Freq: Two times a day (BID) | ORAL | 0 refills | Status: AC

## 2022-01-05 MED ORDER — DULCOLAX 5 MG PO TBEC: 5.0000 mg | DELAYED_RELEASE_TABLET | Freq: Every day | ORAL | 0 refills | Status: DC | PRN

## 2022-01-05 MED ORDER — MAGIC MOUTHWASH W/LIDOCAINE: 5.0000 mL | Freq: Four times a day (QID) | ORAL | Status: AC | PRN

## 2022-01-05 MED ORDER — POLYETHYLENE GLYCOL 3350 17 G PO PACK
17.0000 g | PACK | Freq: Every day | ORAL | Status: DC
  Administered 2022-01-05: 17 g via ORAL
  Filled 2022-01-05: qty 1

## 2022-01-05 MED ORDER — DOXYCYCLINE MONOHYDRATE 100 MG PO TABS: 100.0000 mg | ORAL_TABLET | Freq: Two times a day (BID) | ORAL | 0 refills | Status: DC

## 2022-01-05 MED ORDER — BISACODYL 5 MG PO TBEC
10.0000 mg | DELAYED_RELEASE_TABLET | Freq: Once | ORAL | Status: AC
  Administered 2022-01-05: 10 mg via ORAL
  Filled 2022-01-05: qty 2

## 2022-01-05 NOTE — Progress Notes (Signed)
Physical Therapy Treatment Patient Details Name: Leslie Mullen MRN: [id number] DOB: [birth year] Today's Date: 01/05/2022   History of Present Illness 60 y.o. female presents to [facility] hospital on 01/01/2022 after falling. Pt with recent admission 1/10-1/14 with symptomatic anemia. Pt admitted for management of SIRS. s/p port removal 01/02/22. PMH includes HTN, CHB s/p PPM, DMII, metastatic pancreatic CA, endometrial CA.    PT Comments    Patient progressing well towards PT goals. Tolerated bed mobility and standing transfers with min guard-supervision for safety. Session focused on progressive ambulation, requiring 2 seated rest breaks due to fatigue and SOB. HR ranging from 80s-123 bpm. Discussed energy conservation techniques re: placing chairs around house so she can sit when fatigued. Reports feeling close to baseline. Eager to return home later today. Will follow.   Recommendations for follow up therapy are one component of a multi-disciplinary discharge planning process, led by the attending physician.  Recommendations may be updated based on patient status, additional functional criteria and insurance authorization.  Follow Up Recommendations  Home health PT     Assistance Recommended at Discharge Intermittent Supervision/Assistance  Patient can return home with the following A little help with walking and/or transfers;A little help with bathing/dressing/bathroom;Assist for transportation;Assistance with Education officer, environmental (measurements PT);Wheelchair cushion (measurements PT)    Recommendations for Other Services       Precautions / Restrictions Precautions Precautions: Fall;Other (comment) Precaution Comments: monitor BP and HR Restrictions Weight Bearing Restrictions: No     Mobility  Bed Mobility Overal bed mobility: Needs Assistance Bed Mobility: Supine to Sit     Supine to sit: Supervision, HOB elevated     General bed mobility  comments: NO assist needed today.    Transfers Overall transfer level: Needs assistance Equipment used: Rolling walker (2 wheels) Transfers: Sit to/from Stand Sit to Stand: Min guard           General transfer comment: Min guard for safety, use of momentum to stand from EOB x1, from chair x2.    Ambulation/Gait Ambulation/Gait assistance: Min guard Gait Distance (Feet): 35 Feet (+ 60') Assistive device: Rolling walker (2 wheels) Gait Pattern/deviations: Step-through pattern, Decreased stride length   Gait velocity interpretation: 1.31 - 2.62 ft/sec, indicative of limited community ambulator   General Gait Details: SLow, mostly steady gait with 2 seated rest breaks, 2/4 DOE. HR 80s-123 bpm.   Stairs             Wheelchair Mobility    Modified Rankin (Stroke Patients Only)       Balance Overall balance assessment: Needs assistance Sitting-balance support: No upper extremity supported, Feet supported Sitting balance-Leahy Scale: Good Sitting balance - Comments: ABle to donn socks without difficulty sitting EOB.   Standing balance support: During functional activity, Reliant on assistive device for balance Standing balance-Leahy Scale: Poor                              Cognition Arousal/Alertness: Awake/alert Behavior During Therapy: Flat affect Overall Cognitive Status: Within Functional Limits for tasks assessed                                          Exercises      General Comments General comments (skin integrity, edema, etc.): No dizziness throughout session. Discussed energy conservation techniquers re: placing chairs around  house so she can sit when fatigued. Reports feeling close to baseline.      Pertinent Vitals/Pain Pain Assessment Pain Assessment: 0-10 Pain Score: 6  Pain Location: abdomen Pain Descriptors / Indicators: Aching, Discomfort Pain Intervention(s): Monitored during session, Repositioned     Home Living                          Prior Function            PT Goals (current goals can now be found in the care plan section) Progress towards PT goals: Progressing toward goals    Frequency    Min 3X/week      PT Plan Current plan remains appropriate    Co-evaluation              AM-PAC PT "6 Clicks" Mobility   Outcome Measure  Help needed turning from your back to your side while in a flat bed without using bedrails?: A Little Help needed moving from lying on your back to sitting on the side of a flat bed without using bedrails?: A Little Help needed moving to and from a bed to a chair (including a wheelchair)?: A Little Help needed standing up from a chair using your arms (e.g., wheelchair or bedside chair)?: A Little Help needed to walk in hospital room?: A Little Help needed climbing 3-5 steps with a railing? : A Lot 6 Click Score: 17    End of Session Equipment Utilized During Treatment: Gait belt Activity Tolerance: Patient tolerated treatment well Patient left: in chair;with call bell/phone within reach;with nursing/sitter in room Nurse Communication: Mobility status PT Visit Diagnosis: Other abnormalities of gait and mobility (R26.89)     Time: 2947-6546 PT Time Calculation (min) (ACUTE ONLY): 18 min  Charges:  $Gait Training: 8-22 mins                     Marisa Severin, PT, DPT Acute Rehabilitation Services Pager [phone] Office [phone]      Marguarite Arbour A Sabra Heck 01/05/2022, 9:35 AM

## 2022-01-05 NOTE — Discharge Summary (Signed)
Physician Discharge Summary  Leslie Mullen YTK:[id number] DOB: [birth year] DOA: 01/01/2022  PCP: Sue Lush, PA-C  Admit date: 01/01/2022 Discharge date: 01/05/2022  Admitted From: Home Disposition: Home  Recommendations for Outpatient Follow-up:  Follow up with PCP in 1 week with repeat CBC/BMP outpatient follow-up with oncology/Dr. Burr Medico Follow up in ED if symptoms worsen or new appear   Home Health: Home with PT  equipment/Devices: None  Discharge Condition: Stable CODE STATUS: Full Diet recommendation: Heart healthy/carb modified  Brief/Interim Summary: 61 y.o. female with medical history significant of hypertension, complete heart block s/p Medtronic PPM, DM type II, metastatic pancreatic cancer currently undergoing chemotherapy, endometrial cancer, morbid obesity, recent hospitalization from 12/16/2021-12/20/2021 for symptomatic anemia in the setting of chemotherapy/metastatic pancreatic cancer requiring 4 units of packed red cell transfusion; recent port site dehiscence for which outpatient port removal and new port placement was being planned.  She presented on 01/01/2022 with dizziness and fall.  On presentation, she was tachycardic, febrile, tachypneic with leukocytosis, creatinine of 1.43, glucose of 35 and lactic acid of 2.2.  CT of the head was negative for any acute intracranial abnormity.  Chest x-ray was negative for any acute infiltrates.  She was started on intravenous antibiotics and fluids.  Oncology was consulted.  She underwent port removal by IR on 01/02/2022.  Subsequently, her overall condition has improved.  She has remained afebrile and hemodynamically stable and tolerating diet.  Cultures have remained negative so far.  Kidney function has also improved.  She will be discharged home today on doxycycline for 5 more days.  Outpatient follow-up with oncology and PCP.  Discharge Diagnoses:   Severe sepsis: Present on admission Possible Port-A-Cath site dehiscence and  infection Doubt that patient had UTI Lactic acidosis -Patient presented with dizziness and fall and was found to be febrile, tachycardic, tachypneic with leukocytosis, acute kidney injury with lactic acidosis with concern for Port-A-Cath site dehiscence/infection -Currently on Rocephin and vancomycin.  Blood cultures negative so far.  Urine cultures showing multiple species. -Currently afebrile for the last 48 hours.  Hemodynamically currently stable.  Off IV fluids.  Tolerating diet. -Sepsis has resolved -Status post Port-A-Cath removal on 01/02/2022 by IR.  New Port-A-Cath placement can be done as an outpatient. -Discharge patient home on oral doxycycline for 5 more days.   Acute kidney injury -Baseline creatinine around 1.08.  Presented with creatinine of 1.43.  Creatinine 1.20 today.  Off IV fluids.  Outpatient follow-up of BMP.   Leukocytosis -Resolved   Hypoglycemia in a patient with diabetes mellitus type 2 -Blood sugars much improved and currently slightly on the higher side.  Required IV fluids with dextrose which has been discontinued. Tyler Aas to remain on hold.  Resume Synjardy.  Carb modified diet.  Outpatient follow-up with PCP   metastatic pancreatic cancer -Currently undergoing chemotherapy.  Oncology evaluation appreciated.  Chemotherapy to remain on hold for now and patient is to follow-up with oncology as an outpatient for resumption.  Port-A-Cath plan as above   Fall secondary to dizziness -Possibly from all of the above.  PT recommended home health PT.  No chest pain or palpitations.  High-sensitivity troponins were negative x2.   Paroxysmal A. fib on chronic anticoagulation -Currently rate controlled.  Continue Eliquis  History of complete heart block status post Medtronic PPM -Outpatient follow-up with EP   Essential hypertension -Blood pressure currently stable.  Resume amlodipine.  Anxiety -Continue lorazepam as needed   Hypoalbuminemia -Possibly from  poor oral intake.  Outpatient follow-up.  Encourage oral intake.    Anemia of chronic disease: From cancer and chemotherapy History of present GI bleed requiring 4 unit packed red cells transfusion -Hemoglobin 8 today.  Outpatient follow-up of CBC.  Morbid obesity -Outpatient follow-up  Discharge Instructions  Discharge Instructions     Diet - low sodium heart healthy   Complete by: As directed    Diet Carb Modified   Complete by: As directed    Increase activity slowly   Complete by: As directed       Allergies as of 01/05/2022       Reactions   Penicillins Shortness Of Breath, Itching, Swelling   Has patient had a PCN reaction causing immediate rash, facial/tongue/throat swelling, SOB or lightheadedness with hypotension: Yes Has patient had a PCN reaction causing severe rash involving mucus membranes or skin necrosis:No Has patient had a PCN reaction that required hospitalization: No Has patient had a PCN reaction occurring within the last 10 years: no If all of the above answers are "NO", then may proceed with Cephalosporin use. *Per patient, tolerated Keflex in the past          Medication List     STOP taking these medications    Tresiba FlexTouch 200 UNIT/ML FlexTouch Pen Generic drug: insulin degludec       TAKE these medications    acetaminophen 500 MG tablet Commonly known as: TYLENOL Take 1,000 mg by mouth every 6 (six) hours as needed for moderate pain.   amLODipine 10 MG tablet Commonly known as: NORVASC Take 1 tablet (10 mg total) by mouth at bedtime.   apixaban 5 MG Tabs tablet Commonly known as: Eliquis Take 1 tablet (5 mg total) by mouth 2 (two) times daily.   BIOFREEZE ROLL-ON EX Apply 1 application topically as needed (pain).   doxycycline 100 MG tablet Commonly known as: ADOXA Take 1 tablet (100 mg total) by mouth 2 (two) times daily for 5 days.   Dulcolax 5 MG EC tablet Generic drug: bisacodyl Take 1 tablet (5 mg total) by  mouth daily as needed for severe constipation.   gabapentin 300 MG capsule Commonly known as: NEURONTIN Take 1 capsule (300 mg total) by mouth 2 (two) times daily.   lidocaine-prilocaine cream Commonly known as: EMLA Apply to affected area once What changed:  how much to take how to take this when to take this reasons to take this additional instructions   loratadine 10 MG tablet Commonly known as: CLARITIN Take 10 mg by mouth daily as needed for allergies.   LORazepam 0.5 MG tablet Commonly known as: ATIVAN Take 1 tablet (0.5 mg total) by mouth 2 (two) times daily as needed (for anxiety, and/or chemo-related nausea/vomiting).   magic mouthwash w/lidocaine Soln Take 5 mLs by mouth 4 (four) times daily as needed for mouth pain.   mirtazapine 15 MG tablet Commonly known as: Remeron Take 1 tablet (15 mg total) by mouth at bedtime.   multivitamin capsule Take 1 capsule by mouth daily.   omeprazole 20 MG capsule Commonly known as: PRILOSEC Take 20 mg by mouth daily.   ondansetron 8 MG tablet Commonly known as: Zofran Take 1 tablet (8 mg total) by mouth 2 (two) times daily as needed (Nausea or vomiting).   Oxycodone HCl 10 MG Tabs Take 1 tablet (10 mg total) by mouth every 6 (six) hours as needed. What changed: reasons to take this   polyethylene glycol 17 g packet Commonly known as: MIRALAX / GLYCOLAX Take 17 g  by mouth daily as needed for moderate constipation.   pravastatin 40 MG tablet Commonly known as: PRAVACHOL TAKE 1 TABLET(40 MG) BY MOUTH DAILY What changed: See the new instructions.   prochlorperazine 10 MG tablet Commonly known as: COMPAZINE TAKE 1 TABLET(10 MG) BY MOUTH EVERY 6 HOURS AS NEEDED FOR NAUSEA OR VOMITING What changed: See the new instructions.   senna-docusate 8.6-50 MG tablet Commonly known as: Senokot-S Take 1 tablet by mouth 2 (two) times daily.   Synjardy XR 12.04-999 MG Tb24 Generic drug: Empagliflozin-metFORMIN HCl ER Take 1  tablet by mouth 2 (two) times daily.   trolamine salicylate 10 % cream Commonly known as: ASPERCREME Apply 1 application topically 2 (two) times daily as needed for muscle pain.        Follow-up Information     Sue Lush, PA-C. Schedule an appointment as soon as possible for a visit in 1 week(s).   Specialty: Physician Assistant Why: with repeat cbc/bmp Contact information: [address] [place] [address] [phone]         Vickie Epley, MD .   Specialties: Cardiology, Radiology Contact information: [address] [place] [address] [phone]         Josue Hector, MD .   Specialty: Cardiology Contact information: [phone] [address] [place] [address] [phone]         Truitt Merle, MD. Schedule an appointment as soon as possible for a visit in 1 week(s).   Specialties: Hematology, Oncology Contact information: [place] [address] [phone]                Allergies  Allergen Reactions   Penicillins Shortness Of Breath, Itching and Swelling    Has patient had a PCN reaction causing immediate rash, facial/tongue/throat swelling, SOB or lightheadedness with hypotension: Yes Has patient had a PCN reaction causing severe rash involving mucus membranes or skin necrosis:No Has patient had a PCN reaction that required hospitalization: No Has patient had a PCN reaction occurring within the last 10 years: no If all of the above answers are "NO", then may proceed with Cephalosporin use.  *Per patient, tolerated Keflex in the past      Consultations:  Oncology/IR   Procedures/Studies: CT HEAD WO CONTRAST (5MM)  Result Date: 01/01/2022 CLINICAL DATA:  Nonspecific dizziness with slight headache. On Eliquis. EXAM: CT HEAD WITHOUT CONTRAST TECHNIQUE: Contiguous axial images were obtained from the base of the skull through the vertex without intravenous  contrast. RADIATION DOSE REDUCTION: This exam was performed according to the departmental dose-optimization program which includes automated exposure control, adjustment of the mA and/or kV according to patient size and/or use of iterative reconstruction technique. COMPARISON:  Sixteen days ago FINDINGS: Brain: No evidence of acute infarction, hemorrhage, hydrocephalus, extra-axial collection or mass lesion/mass effect. Dilated perivascular space below the left putamen. Notable cerebellar volume loss for age. Vascular: No hyperdense vessel or unexpected calcification. Skull: Normal. Negative for fracture or focal lesion. Sinuses/Orbits: No acute finding. IMPRESSION: No acute or interval finding. Electronically Signed   By: Jorje Guild M.D.   On: 01/01/2022 06:11   CT Head Wo Contrast  Result Date: 12/16/2021 CLINICAL DATA:  Head trauma, moderate to severe. Fall with head injury on anticoagulation. EXAM: CT HEAD WITHOUT CONTRAST TECHNIQUE: Contiguous axial images were obtained from the base of the skull through the vertex without intravenous contrast. COMPARISON:  None. FINDINGS: Brain: The brain shows a  normal appearance without evidence of malformation, atrophy, old or acute small or large vessel infarction, mass lesion, hemorrhage, hydrocephalus or extra-axial collection. Vascular: There is atherosclerotic calcification of the major vessels at the base of the brain. Skull: Normal.  No traumatic finding.  No focal bone lesion. Sinuses/Orbits: Sinuses are clear. Orbits appear normal. Mastoids are clear. Other: None significant IMPRESSION: No acute or traumatic finding. Normal study with exception of atherosclerotic calcification of the major vessels at the base of brain. Electronically Signed   By: Nelson Chimes M.D.   On: 12/16/2021 13:19   IR REMOVAL TUN ACCESS W/ PORT W/O FL MOD SED  Result Date: 01/02/2022 INDICATION: 61 year old with pancreatic cancer. Port-A-Cath was placed on 11/11/2021. Partial  incision dehiscence was noted and there is concern for port infection. Plan for port removal. EXAM: REMOVAL RIGHT IJ VEIN PORT-A-CATH MEDICATIONS: Moderate sedation ANESTHESIA/SEDATION: Moderate (conscious) sedation was employed during this procedure. A total of Versed 2.0mg  and fentanyl 100 mcg was administered intravenously at the order of the provider performing the procedure. Total intra-service moderate sedation time: 23 minutes. Patient's level of consciousness and vital signs were monitored continuously by radiology nurse throughout the procedure under the supervision of the provider performing the procedure. FLUOROSCOPY TIME:  None COMPLICATIONS: None immediate. PROCEDURE: Informed written consent was obtained from the patient after a thorough discussion of the procedural risks, benefits and alternatives. All questions were addressed. Maximal Sterile Barrier Technique was utilized including caps, mask, sterile gowns, sterile gloves, sterile drape, hand hygiene and skin antiseptic. A timeout was performed prior to the initiation of the procedure. The right chest was prepped and draped in a sterile fashion. Lidocaine with epinephrine was utilized for local anesthesia. An incision was made over the port incision. Utilizing blunt dissection, the port catheter and reservoir were removed from the underlying subcutaneous tissue in their entirety. The pocket was irrigated with a copious amount of sterile normal saline. Pocket was closed using 3 interrupted 2-0 Ethilon sutures. Dressing was placed over the incision. FINDINGS: The port incision was almost completely closed. There was small amount of dry blood or scab formation along the medial aspect of the incision. No significant erythema around the port. Incision was made over the old incision. No evidence for purulent drainage. Small amount of bloody fluid was removed from the pocket. Inspection of the port pocket demonstrated normal healthy tissue. Pocket was  rigorously irrigated with saline. Incision was closed using interrupted sutures. IMPRESSION: Successful right IJ vein Port-A-Cath explant. Patient has interrupted sutures that will be removed in approximately 2 weeks. Electronically Signed   By: Markus Daft M.D.   On: 01/02/2022 14:19   DG Chest Portable 1 View  Result Date: 01/01/2022 CLINICAL DATA:  61 year old female with history of dizziness. EXAM: PORTABLE CHEST 1 VIEW COMPARISON:  Chest x-ray 06/15/2021. FINDINGS: Lung volumes are low. No consolidative airspace disease. No pleural effusions. No pneumothorax. No pulmonary nodule or mass noted. Pulmonary vasculature and the cardiomediastinal silhouette are within normal limits. Left-sided pacemaker device in place with lead tips projecting over the expected location of the right atrium and right ventricle. Right internal jugular single-lumen power porta cath with tip terminating in the mid superior vena cava. IMPRESSION: 1. Low lung volumes without radiographic evidence of acute cardiopulmonary disease. Electronically Signed   By: Vinnie Langton M.D.   On: 01/01/2022 05:39   ECHOCARDIOGRAM COMPLETE  Result Date: 12/17/2021    ECHOCARDIOGRAM REPORT   Patient Name:   ANAY WALTER Date of Exam: 12/17/2021  Medical Rec #:  [id number]    Height:       68.0 in Accession #:    [id number]   Weight:       308.0 lb Date of Birth:  1961-09-24    BSA:          2.455 m Patient Age:    58 years     BP:           124/71 mmHg Patient Gender: F            HR:           81 bpm. Exam Location:  Inpatient Procedure: 2D Echo Indications:     Syncope  History:         Patient has prior history of Echocardiogram examinations, most                  recent 10/10/2020. Risk Factors:Diabetes, Hypertension and                  Dyslipidemia. Complete heart block.  Sonographer:     Arlyss Gandy Referring Phys:  [id number] RALPH A NETTEY Diagnosing Phys: Fransico Him MD IMPRESSIONS  1. Left ventricular ejection fraction, by estimation, is 60  to 65%. The left ventricle has normal function. The left ventricle has no regional wall motion abnormalities. Left ventricular diastolic parameters are consistent with Grade I diastolic dysfunction (impaired relaxation).  2. Right ventricular systolic function is normal. The right ventricular size is normal. There is normal pulmonary artery systolic pressure. The estimated right ventricular systolic pressure is 84.6 mmHg.  3. The mitral valve is normal in structure. No evidence of mitral valve regurgitation. No evidence of mitral stenosis.  4. The aortic valve is tricuspid. Aortic valve regurgitation is not visualized. Aortic valve sclerosis is present, with no evidence of aortic valve stenosis.  5. The inferior vena cava is dilated in size with <50% respiratory variability, suggesting right atrial pressure of 15 mmHg. FINDINGS  Left Ventricle: Left ventricular ejection fraction, by estimation, is 60 to 65%. The left ventricle has normal function. The left ventricle has no regional wall motion abnormalities. The left ventricular internal cavity size was normal in size. There is  no left ventricular hypertrophy. Left ventricular diastolic parameters are consistent with Grade I diastolic dysfunction (impaired relaxation). Normal left ventricular filling pressure. Right Ventricle: The right ventricular size is normal. No increase in right ventricular wall thickness. Right ventricular systolic function is normal. There is normal pulmonary artery systolic pressure. The tricuspid regurgitant velocity is 2.23 m/s, and  with an assumed right atrial pressure of 15 mmHg, the estimated right ventricular systolic pressure is 96.2 mmHg. Left Atrium: Left atrial size was normal in size. Right Atrium: Right atrial size was normal in size. Pericardium: There is no evidence of pericardial effusion. Mitral Valve: The mitral valve is normal in structure. No evidence of mitral valve regurgitation. No evidence of mitral valve stenosis.  Tricuspid Valve: The tricuspid valve is normal in structure. Tricuspid valve regurgitation is trivial. No evidence of tricuspid stenosis. Aortic Valve: The aortic valve is tricuspid. Aortic valve regurgitation is not visualized. Aortic valve sclerosis is present, with no evidence of aortic valve stenosis. Aortic valve mean gradient measures 4.0 mmHg. Aortic valve peak gradient measures 7.4  mmHg. Aortic valve area, by VTI measures 2.99 cm. Pulmonic Valve: The pulmonic valve was normal in structure. Pulmonic valve regurgitation is not visualized. No evidence of pulmonic stenosis. Aorta: The aortic root is normal in size  and structure. Venous: The inferior vena cava is dilated in size with less than 50% respiratory variability, suggesting right atrial pressure of 15 mmHg. IAS/Shunts: No atrial level shunt detected by color flow Doppler. Additional Comments: A device lead is visualized.  LEFT VENTRICLE PLAX 2D LVIDd:         4.70 cm   Diastology LVIDs:         3.40 cm   LV e' medial:    6.09 cm/s LV PW:         1.00 cm   LV E/e' medial:  13.6 LV IVS:        1.00 cm   LV e' lateral:   6.31 cm/s LVOT diam:     2.30 cm   LV E/e' lateral: 13.2 LV SV:         82 LV SV Index:   34 LVOT Area:     4.15 cm  RIGHT VENTRICLE             IVC RV S prime:     11.30 cm/s  IVC diam: 2.50 cm TAPSE (M-mode): 1.8 cm LEFT ATRIUM             Index LA diam:        3.40 cm 1.38 cm/m LA Vol (A2C):   48.8 ml 19.88 ml/m LA Vol (A4C):   47.7 ml 19.43 ml/m LA Biplane Vol: 48.5 ml 19.75 ml/m  AORTIC VALVE AV Area (Vmax):    2.61 cm AV Area (Vmean):   2.68 cm AV Area (VTI):     2.99 cm AV Vmax:           136.00 cm/s AV Vmean:          95.100 cm/s AV VTI:            0.275 m AV Peak Grad:      7.4 mmHg AV Mean Grad:      4.0 mmHg LVOT Vmax:         85.50 cm/s LVOT Vmean:        61.400 cm/s LVOT VTI:          0.198 m LVOT/AV VTI ratio: 0.72  AORTA Ao Root diam: 3.10 cm Ao Asc diam:  2.90 cm MITRAL VALVE               TRICUSPID VALVE MV Area  (PHT): 2.87 cm    TR Peak grad:   19.9 mmHg MV Decel Time: 264 msec    TR Vmax:        223.00 cm/s MV E velocity: 83.10 cm/s MV A velocity: 98.50 cm/s  SHUNTS MV E/A ratio:  0.84        Systemic VTI:  0.20 m                            Systemic Diam: 2.30 cm Fransico Him MD Electronically signed by Fransico Him MD Signature Date/Time: 12/17/2021/9:03:38 AM    Final (Updated)       Subjective: Patient seen and examined at bedside.  Complains of constipation.  Denies worsening shortness of breath, fever or vomiting.  Feels okay going home today.  Discharge Exam: Vitals:   01/05/22 0503 01/05/22 0838  BP: (!) 147/69 (!) 152/71  Pulse: 84 73  Resp: 12 17  Temp: 98.3 F (36.8 C) 98.8 F (37.1 C)  SpO2: 97% 97%    General: Pt is alert, awake, not in acute  distress.  Currently on room air. Cardiovascular: rate controlled, S1/S2 + Respiratory: bilateral decreased breath sounds at bases Abdominal: Soft, morbidly obese, NT, ND, bowel sounds + Extremities: Trace lower extremity edema; no cyanosis    The results of significant diagnostics from this hospitalization (including imaging, microbiology, ancillary and laboratory) are listed below for reference.     Microbiology: Recent Results (from the past 240 hour(s))  Resp Panel by RT-PCR (Flu A&B, Covid) Nasopharyngeal Swab     Status: None   Collection Time: 01/01/22  8:16 AM   Specimen: Nasopharyngeal Swab; Nasopharyngeal(NP) swabs in vial transport medium  Result Value Ref Range Status   SARS Coronavirus 2 by RT PCR NEGATIVE NEGATIVE Final    Comment: (NOTE) SARS-CoV-2 target nucleic acids are NOT DETECTED.  The SARS-CoV-2 RNA is generally detectable in upper respiratory specimens during the acute phase of infection. The lowest concentration of SARS-CoV-2 viral copies this assay can detect is 138 copies/mL. A negative result does not preclude SARS-Cov-2 infection and should not be used as the sole basis for treatment or other patient  management decisions. A negative result may occur with  improper specimen collection/handling, submission of specimen other than nasopharyngeal swab, presence of viral mutation(s) within the areas targeted by this assay, and inadequate number of viral copies(<138 copies/mL). A negative result must be combined with clinical observations, patient history, and epidemiological information. The expected result is Negative.  Fact Sheet for Patients:  EntrepreneurPulse.com.au  Fact Sheet for Healthcare Providers:  IncredibleEmployment.be  This test is no t yet approved or cleared by the [place] FDA and  has been authorized for detection and/or diagnosis of SARS-CoV-2 by FDA under an Emergency Use Authorization (EUA). This EUA will remain  in effect (meaning this test can be used) for the duration of the COVID-19 declaration under Section 564(b)(1) of the Act, 21 U.S.C.section 360bbb-3(b)(1), unless the authorization is terminated  or revoked sooner.       Influenza A by PCR NEGATIVE NEGATIVE Final   Influenza B by PCR NEGATIVE NEGATIVE Final    Comment: (NOTE) The Xpert Xpress SARS-CoV-2/FLU/RSV plus assay is intended as an aid in the diagnosis of influenza from Nasopharyngeal swab specimens and should not be used as a sole basis for treatment. Nasal washings and aspirates are unacceptable for Xpert Xpress SARS-CoV-2/FLU/RSV testing.  Fact Sheet for Patients: EntrepreneurPulse.com.au  Fact Sheet for Healthcare Providers: IncredibleEmployment.be  This test is not yet approved or cleared by the [place] FDA and has been authorized for detection and/or diagnosis of SARS-CoV-2 by FDA under an Emergency Use Authorization (EUA). This EUA will remain in effect (meaning this test can be used) for the duration of the COVID-19 declaration under Section 564(b)(1) of the Act, 21 U.S.C. section 360bbb-3(b)(1),  unless the authorization is terminated or revoked.  Performed at [facility], [place] [address], [place], [place] [address]   Urine Culture     Status: Abnormal   Collection Time: 01/01/22  8:43 AM   Specimen: Urine, Clean Catch  Result Value Ref Range Status   Specimen Description URINE, CLEAN CATCH  Final   Special Requests   Final    NONE Performed at [facility], [place] [address] [place], [place] [address]    Culture (A)  Final    >=100,000 COLONIES/mL MULTIPLE SPECIES PRESENT, SUGGEST RECOLLECTION   Report Status 01/02/2022 FINAL  Final  Culture, blood (routine x 2)     Status: None (Preliminary result)   Collection Time:  01/01/22 11:52 AM   Specimen: BLOOD  Result Value Ref Range Status   Specimen Description BLOOD BLOOD RIGHT ARM  Final   Special Requests   Final    BOTTLES DRAWN AEROBIC AND ANAEROBIC Blood Culture results may not be optimal due to an inadequate volume of blood received in culture bottles   Culture   Final    NO GROWTH 3 DAYS Performed at [facility], [place] [address], [place], [place] [id number]    Report Status PENDING  Incomplete  Culture, blood (routine x 2)     Status: None (Preliminary result)   Collection Time: 01/02/22  1:27 AM   Specimen: BLOOD  Result Value Ref Range Status   Specimen Description BLOOD SITE NOT SPECIFIED  Final   Special Requests   Final    AEROBIC BOTTLE ONLY Blood Culture results may not be optimal due to an inadequate volume of blood received in culture bottles   Culture   Final    NO GROWTH 2 DAYS Performed at [facility], [place] [address], [place], [place] [address]    Report Status PENDING  Incomplete     Labs: BNP (last 3 results) No results for input(s): BNP in the last 8760 hours. Basic Metabolic Panel: Recent Labs  Lab 01/01/22 0812 01/02/22 0127 01/03/22 0301 01/04/22 0414 01/05/22 0104  NA 139 134* 134* 138 139  K 4.0 4.1 4.3 3.9 3.8  CL 103 102 105 111 106  CO2 25 25 24 22 24    GLUCOSE 68* 91 196* 178* 175*  BUN 16 17 17 13 11   CREATININE 1.43* 1.56* 1.45* 1.28* 1.20*  CALCIUM 8.4* 7.4* 6.9* 7.1* 7.7*  MG  --   --  1.8 1.9 2.0   Liver Function Tests: Recent Labs  Lab 12/29/21 1055 01/01/22 0812 01/03/22 0301 01/05/22 0104  AST 17 23 16 17   ALT 8 11 9 10   ALKPHOS 100 109 80 82  BILITOT 0.4 0.5 0.2* 0.2*  PROT 6.7 7.7 5.1* 6.0*  ALBUMIN 2.4* 1.9* <1.5* <1.5*   Recent Labs  Lab 01/01/22 0812  LIPASE 23   No results for input(s): AMMONIA in the last 168 hours. CBC: Recent Labs  Lab 01/01/22 0812 01/02/22 0127 01/03/22 0301 01/04/22 0414 01/05/22 0104  WBC 24.9* 17.7* 9.5 5.1 6.7  NEUTROABS 23.8* 15.6* 7.7 3.1 4.3  HGB 12.0 8.9* 7.3* 8.3* 8.0*  HCT 38.8 27.9* 23.6* 25.9* 25.3*  MCV 91.1 90.0 91.1 90.9 91.7  PLT 397 228 208 180 184   Cardiac Enzymes: Recent Labs  Lab 01/02/22 0127  CKTOTAL 82   BNP: Invalid input(s): POCBNP CBG: Recent Labs  Lab 01/04/22 1529 01/04/22 2044 01/04/22 2334 01/05/22 0505 01/05/22 0845  GLUCAP 169* 173* 169* 143* 153*   D-Dimer No results for input(s): DDIMER in the last 72 hours. Hgb A1c No results for input(s): HGBA1C in the last 72 hours. Lipid Profile No results for input(s): CHOL, HDL, LDLCALC, TRIG, CHOLHDL, LDLDIRECT in the last 72 hours. Thyroid function studies No results for input(s): TSH, T4TOTAL, T3FREE, THYROIDAB in the last 72 hours.  Invalid input(s): FREET3 Anemia work up No results for input(s): VITAMINB12, FOLATE, FERRITIN, TIBC, IRON, RETICCTPCT in the last 72 hours. Urinalysis    Component Value Date/Time   COLORURINE AMBER (A) 01/01/2022 0823   APPEARANCEUR HAZY (A) 01/01/2022 0823   LABSPEC 1.017 01/01/2022 0823   PHURINE 5.0 01/01/2022 0823   GLUCOSEU 150 (A) 01/01/2022 0823   HGBUR SMALL (A)  01/01/2022 0823   BILIRUBINUR NEGATIVE 01/01/2022 0823   [place] 01/01/2022 0823   PROTEINUR 100 (A) 01/01/2022 0823   NITRITE NEGATIVE 01/01/2022 0823    LEUKOCYTESUR TRACE (A) 01/01/2022 0823   Sepsis Labs Invalid input(s): PROCALCITONIN,  WBC,  LACTICIDVEN Microbiology Recent Results (from the past 240 hour(s))  Resp Panel by RT-PCR (Flu A&B, Covid) Nasopharyngeal Swab     Status: None   Collection Time: 01/01/22  8:16 AM   Specimen: Nasopharyngeal Swab; Nasopharyngeal(NP) swabs in vial transport medium  Result Value Ref Range Status   SARS Coronavirus 2 by RT PCR NEGATIVE NEGATIVE Final    Comment: (NOTE) SARS-CoV-2 target nucleic acids are NOT DETECTED.  The SARS-CoV-2 RNA is generally detectable in upper respiratory specimens during the acute phase of infection. The lowest concentration of SARS-CoV-2 viral copies this assay can detect is 138 copies/mL. A negative result does not preclude SARS-Cov-2 infection and should not be used as the sole basis for treatment or other patient management decisions. A negative result may occur with  improper specimen collection/handling, submission of specimen other than nasopharyngeal swab, presence of viral mutation(s) within the areas targeted by this assay, and inadequate number of viral copies(<138 copies/mL). A negative result must be combined with clinical observations, patient history, and epidemiological information. The expected result is Negative.  Fact Sheet for Patients:  EntrepreneurPulse.com.au  Fact Sheet for Healthcare Providers:  IncredibleEmployment.be  This test is no t yet approved or cleared by the [place] FDA and  has been authorized for detection and/or diagnosis of SARS-CoV-2 by FDA under an Emergency Use Authorization (EUA). This EUA will remain  in effect (meaning this test can be used) for the duration of the COVID-19 declaration under Section 564(b)(1) of the Act, 21 U.S.C.section 360bbb-3(b)(1), unless the authorization is terminated  or revoked sooner.       Influenza A by PCR NEGATIVE NEGATIVE Final   Influenza B  by PCR NEGATIVE NEGATIVE Final    Comment: (NOTE) The Xpert Xpress SARS-CoV-2/FLU/RSV plus assay is intended as an aid in the diagnosis of influenza from Nasopharyngeal swab specimens and should not be used as a sole basis for treatment. Nasal washings and aspirates are unacceptable for Xpert Xpress SARS-CoV-2/FLU/RSV testing.  Fact Sheet for Patients: EntrepreneurPulse.com.au  Fact Sheet for Healthcare Providers: IncredibleEmployment.be  This test is not yet approved or cleared by the [place] FDA and has been authorized for detection and/or diagnosis of SARS-CoV-2 by FDA under an Emergency Use Authorization (EUA). This EUA will remain in effect (meaning this test can be used) for the duration of the COVID-19 declaration under Section 564(b)(1) of the Act, 21 U.S.C. section 360bbb-3(b)(1), unless the authorization is terminated or revoked.  Performed at [facility], [place] [address] [place], [place] [address]   Urine Culture     Status: Abnormal   Collection Time: 01/01/22  8:43 AM   Specimen: Urine, Clean Catch  Result Value Ref Range Status   Specimen Description URINE, CLEAN CATCH  Final   Special Requests   Final    NONE Performed at [facility], [place] [address], [place], [place] [address]    Culture (A)  Final    >=100,000 COLONIES/mL MULTIPLE SPECIES PRESENT, SUGGEST RECOLLECTION   Report Status 01/02/2022 FINAL  Final  Culture, blood (routine x 2)     Status: None (Preliminary result)   Collection Time: 01/01/22 11:52 AM   Specimen: BLOOD  Result Value Ref Range Status  Specimen Description BLOOD BLOOD RIGHT ARM  Final   Special Requests   Final    BOTTLES DRAWN AEROBIC AND ANAEROBIC Blood Culture results may not be optimal due to an inadequate volume of blood received in culture bottles   Culture   Final    NO GROWTH 3 DAYS Performed at [facility], [place] [address], [place], [place] [address]    Report  Status PENDING  Incomplete  Culture, blood (routine x 2)     Status: None (Preliminary result)   Collection Time: 01/02/22  1:27 AM   Specimen: BLOOD  Result Value Ref Range Status   Specimen Description BLOOD SITE NOT SPECIFIED  Final   Special Requests   Final    AEROBIC BOTTLE ONLY Blood Culture results may not be optimal due to an inadequate volume of blood received in culture bottles   Culture   Final    NO GROWTH 2 DAYS Performed at [facility], [place] [address] [place],  [address]    Report Status PENDING  Incomplete     Time coordinating discharge: 35 minutes  SIGNED:   Aline August, MD  [facility] 01/05/2022, 10:31 AM

## 2022-01-05 NOTE — TOC Transition Note (Signed)
Transition of Care (TOC) - CM/SW Discharge Note [name] RN, BSN Transitions of Care Unit 4E- RN Case Manager See Treatment Team for direct phone #    Patient Details  Name: Leslie Mullen MRN: [id number] Date of Birth: 1960-12-18  Transition of Care [facility]) CM/SW Contact:  Dawayne Patricia, RN Phone Number: 01/05/2022, 11:03 AM   Clinical Narrative:    Pt stable for transition home today, CM spoke with pt again at bedside for [facility] and DME needs. Pt still states she wants to check with her insurance before committing to [facility] [employer] services- reports she still has [place] list to review with insurance for in-network choice.  Pt also still declines any new DME needs- reports she has what she needs at home.  Family to provide transport home-  No further TOC needs noted at this time, pt declines HH referral and DME needs at time of discharge.    Final next level of care: Home/Self Care Barriers to Discharge: No Barriers Identified   Patient Goals and CMS Choice Patient states their goals for this hospitalization and ongoing recovery are:: return home CMS Medicare.gov Compare Post Acute Care list provided to:: Patient Choice offered to / list presented to : Patient  Discharge Placement                 Home      Discharge Plan and Services   Discharge Planning Services: CM Consult Post Acute Care Choice: Home Health, Durable Medical Equipment          DME Arranged: N/A DME Agency: NA       HH Arranged: NA HH Agency: NA        Social Determinants of Health (SDOH) Interventions     Readmission Risk Interventions No flowsheet data found.

## 2022-01-06 ENCOUNTER — Other Ambulatory Visit: Payer: Self-pay | Admitting: Cardiology

## 2022-01-06 ENCOUNTER — Other Ambulatory Visit (HOSPITAL_COMMUNITY): Payer: 59

## 2022-01-06 ENCOUNTER — Inpatient Hospital Stay: Payer: 59

## 2022-01-06 ENCOUNTER — Ambulatory Visit (HOSPITAL_COMMUNITY): Payer: 59

## 2022-01-06 LAB — CULTURE, BLOOD (ROUTINE X 2): Culture: NO GROWTH

## 2022-01-07 ENCOUNTER — Encounter (HOSPITAL_COMMUNITY): Payer: Self-pay | Admitting: Emergency Medicine

## 2022-01-07 ENCOUNTER — Emergency Department (HOSPITAL_COMMUNITY): Payer: 59

## 2022-01-07 ENCOUNTER — Other Ambulatory Visit: Payer: Self-pay

## 2022-01-07 ENCOUNTER — Inpatient Hospital Stay (HOSPITAL_COMMUNITY)
Admission: EM | Admit: 2022-01-07 | Discharge: 2022-01-26 | DRG: 871 | Disposition: A | Discharge status: Home Health | Payer: 59 | Attending: Family Medicine | Admitting: Family Medicine

## 2022-01-07 DIAGNOSIS — R531 Weakness: Secondary | ICD-10-CM | POA: Diagnosis not present

## 2022-01-07 DIAGNOSIS — Z809 Family history of malignant neoplasm, unspecified: Secondary | ICD-10-CM

## 2022-01-07 DIAGNOSIS — K269 Duodenal ulcer, unspecified as acute or chronic, without hemorrhage or perforation: Secondary | ICD-10-CM | POA: Diagnosis present

## 2022-01-07 DIAGNOSIS — C7889 Secondary malignant neoplasm of other digestive organs: Secondary | ICD-10-CM | POA: Diagnosis present

## 2022-01-07 DIAGNOSIS — I129 Hypertensive chronic kidney disease with stage 1 through stage 4 chronic kidney disease, or unspecified chronic kidney disease: Secondary | ICD-10-CM | POA: Diagnosis present

## 2022-01-07 DIAGNOSIS — Z20822 Contact with and (suspected) exposure to covid-19: Secondary | ICD-10-CM | POA: Diagnosis present

## 2022-01-07 DIAGNOSIS — K311 Adult hypertrophic pyloric stenosis: Principal | ICD-10-CM | POA: Diagnosis present

## 2022-01-07 DIAGNOSIS — C787 Secondary malignant neoplasm of liver and intrahepatic bile duct: Secondary | ICD-10-CM | POA: Diagnosis present

## 2022-01-07 DIAGNOSIS — K760 Fatty (change of) liver, not elsewhere classified: Secondary | ICD-10-CM | POA: Diagnosis present

## 2022-01-07 DIAGNOSIS — C259 Malignant neoplasm of pancreas, unspecified: Secondary | ICD-10-CM | POA: Diagnosis present

## 2022-01-07 DIAGNOSIS — Z794 Long term (current) use of insulin: Secondary | ICD-10-CM

## 2022-01-07 DIAGNOSIS — Z66 Do not resuscitate: Secondary | ICD-10-CM | POA: Diagnosis present

## 2022-01-07 DIAGNOSIS — R112 Nausea with vomiting, unspecified: Secondary | ICD-10-CM | POA: Diagnosis present

## 2022-01-07 DIAGNOSIS — D611 Drug-induced aplastic anemia: Secondary | ICD-10-CM | POA: Diagnosis present

## 2022-01-07 DIAGNOSIS — E1165 Type 2 diabetes mellitus with hyperglycemia: Secondary | ICD-10-CM | POA: Diagnosis present

## 2022-01-07 DIAGNOSIS — K5900 Constipation, unspecified: Secondary | ICD-10-CM | POA: Diagnosis present

## 2022-01-07 DIAGNOSIS — G893 Neoplasm related pain (acute) (chronic): Secondary | ICD-10-CM | POA: Diagnosis not present

## 2022-01-07 DIAGNOSIS — K2210 Ulcer of esophagus without bleeding: Secondary | ICD-10-CM | POA: Diagnosis present

## 2022-01-07 DIAGNOSIS — Z833 Family history of diabetes mellitus: Secondary | ICD-10-CM

## 2022-01-07 DIAGNOSIS — K5903 Drug induced constipation: Secondary | ICD-10-CM | POA: Diagnosis not present

## 2022-01-07 DIAGNOSIS — J189 Pneumonia, unspecified organism: Secondary | ICD-10-CM | POA: Diagnosis present

## 2022-01-07 DIAGNOSIS — E11649 Type 2 diabetes mellitus with hypoglycemia without coma: Secondary | ICD-10-CM | POA: Diagnosis present

## 2022-01-07 DIAGNOSIS — E785 Hyperlipidemia, unspecified: Secondary | ICD-10-CM | POA: Diagnosis present

## 2022-01-07 DIAGNOSIS — N179 Acute kidney failure, unspecified: Secondary | ICD-10-CM | POA: Diagnosis present

## 2022-01-07 DIAGNOSIS — K317 Polyp of stomach and duodenum: Secondary | ICD-10-CM | POA: Diagnosis present

## 2022-01-07 DIAGNOSIS — D638 Anemia in other chronic diseases classified elsewhere: Secondary | ICD-10-CM | POA: Diagnosis present

## 2022-01-07 DIAGNOSIS — N189 Chronic kidney disease, unspecified: Secondary | ICD-10-CM | POA: Diagnosis present

## 2022-01-07 DIAGNOSIS — Z79899 Other long term (current) drug therapy: Secondary | ICD-10-CM

## 2022-01-07 DIAGNOSIS — Z87891 Personal history of nicotine dependence: Secondary | ICD-10-CM

## 2022-01-07 DIAGNOSIS — Z880 Allergy status to penicillin: Secondary | ICD-10-CM

## 2022-01-07 DIAGNOSIS — Z8249 Family history of ischemic heart disease and other diseases of the circulatory system: Secondary | ICD-10-CM

## 2022-01-07 DIAGNOSIS — E1122 Type 2 diabetes mellitus with diabetic chronic kidney disease: Secondary | ICD-10-CM | POA: Diagnosis present

## 2022-01-07 DIAGNOSIS — E1142 Type 2 diabetes mellitus with diabetic polyneuropathy: Secondary | ICD-10-CM | POA: Diagnosis not present

## 2022-01-07 DIAGNOSIS — K5730 Diverticulosis of large intestine without perforation or abscess without bleeding: Secondary | ICD-10-CM | POA: Diagnosis present

## 2022-01-07 DIAGNOSIS — Z515 Encounter for palliative care: Secondary | ICD-10-CM

## 2022-01-07 DIAGNOSIS — Z8507 Personal history of malignant neoplasm of pancreas: Secondary | ICD-10-CM

## 2022-01-07 DIAGNOSIS — A419 Sepsis, unspecified organism: Secondary | ICD-10-CM | POA: Diagnosis present

## 2022-01-07 DIAGNOSIS — K219 Gastro-esophageal reflux disease without esophagitis: Secondary | ICD-10-CM | POA: Diagnosis present

## 2022-01-07 DIAGNOSIS — Z6841 Body Mass Index (BMI) 40.0 and over, adult: Secondary | ICD-10-CM

## 2022-01-07 DIAGNOSIS — I442 Atrioventricular block, complete: Secondary | ICD-10-CM | POA: Diagnosis not present

## 2022-01-07 DIAGNOSIS — I480 Paroxysmal atrial fibrillation: Secondary | ICD-10-CM | POA: Diagnosis present

## 2022-01-07 DIAGNOSIS — K921 Melena: Secondary | ICD-10-CM | POA: Diagnosis present

## 2022-01-07 DIAGNOSIS — T402X5A Adverse effect of other opioids, initial encounter: Secondary | ICD-10-CM | POA: Diagnosis not present

## 2022-01-07 DIAGNOSIS — Z7901 Long term (current) use of anticoagulants: Secondary | ICD-10-CM

## 2022-01-07 DIAGNOSIS — Z8542 Personal history of malignant neoplasm of other parts of uterus: Secondary | ICD-10-CM

## 2022-01-07 DIAGNOSIS — R140 Abdominal distension (gaseous): Secondary | ICD-10-CM

## 2022-01-07 DIAGNOSIS — E876 Hypokalemia: Secondary | ICD-10-CM | POA: Diagnosis present

## 2022-01-07 DIAGNOSIS — Z7189 Other specified counseling: Secondary | ICD-10-CM | POA: Diagnosis not present

## 2022-01-07 DIAGNOSIS — Z950 Presence of cardiac pacemaker: Secondary | ICD-10-CM

## 2022-01-07 DIAGNOSIS — Z934 Other artificial openings of gastrointestinal tract status: Secondary | ICD-10-CM

## 2022-01-07 LAB — RESP PANEL BY RT-PCR (FLU A&B, COVID) ARPGX2
Influenza A by PCR: NEGATIVE
Influenza B by PCR: NEGATIVE
SARS Coronavirus 2 by RT PCR: NEGATIVE

## 2022-01-07 LAB — PROTIME-INR
INR: 1.6 — ABNORMAL HIGH (ref 0.8–1.2)
Prothrombin Time: 18.7 seconds — ABNORMAL HIGH (ref 11.4–15.2)

## 2022-01-07 LAB — COMPREHENSIVE METABOLIC PANEL
ALT: 13 U/L (ref 0–44)
AST: 17 U/L (ref 15–41)
Albumin: 2.3 g/dL — ABNORMAL LOW (ref 3.5–5.0)
Alkaline Phosphatase: 86 U/L (ref 38–126)
Anion gap: >20 — ABNORMAL HIGH (ref 5–15)
BUN: 24 mg/dL — ABNORMAL HIGH (ref 6–20)
CO2: 25 mmol/L (ref 22–32)
Calcium: 8.9 mg/dL (ref 8.9–10.3)
Chloride: 93 mmol/L — ABNORMAL LOW (ref 98–111)
Creatinine, Ser: 1.69 mg/dL — ABNORMAL HIGH (ref 0.44–1.00)
GFR, Estimated: 35 mL/min — ABNORMAL LOW (ref >60)
Glucose, Bld: 376 mg/dL — ABNORMAL HIGH (ref 70–99)
Potassium: 3.8 mmol/L (ref 3.5–5.1)
Sodium: 141 mmol/L (ref 135–145)
Total Bilirubin: 0.8 mg/dL (ref 0.3–1.2)
Total Protein: 8.2 g/dL — ABNORMAL HIGH (ref 6.5–8.1)

## 2022-01-07 LAB — CBC
HCT: 33.1 % — ABNORMAL LOW (ref 36.0–46.0)
Hemoglobin: 10.6 g/dL — ABNORMAL LOW (ref 12.0–15.0)
MCH: 28.9 pg (ref 26.0–34.0)
MCHC: 32 g/dL (ref 30.0–36.0)
MCV: 90.2 fL (ref 80.0–100.0)
Platelets: 294 10*3/uL (ref 150–400)
RBC: 3.67 MIL/uL — ABNORMAL LOW (ref 3.87–5.11)
RDW: 19.9 % — ABNORMAL HIGH (ref 11.5–15.5)
WBC: 15.3 10*3/uL — ABNORMAL HIGH (ref 4.0–10.5)
nRBC: 0.8 % — ABNORMAL HIGH (ref 0.0–0.2)

## 2022-01-07 LAB — LIPASE, BLOOD: Lipase: 43 U/L (ref 11–51)

## 2022-01-07 LAB — LACTIC ACID, PLASMA
Lactic Acid, Venous: 2.3 mmol/L (ref 0.5–1.9)
Lactic Acid, Venous: 2.5 mmol/L (ref 0.5–1.9)
Lactic Acid, Venous: 1.6 mmol/L (ref 0.5–1.9)
Lactic Acid, Venous: 2.1 mmol/L (ref 0.5–1.9)

## 2022-01-07 LAB — CULTURE, BLOOD (ROUTINE X 2): Culture: NO GROWTH

## 2022-01-07 LAB — TROPONIN I (HIGH SENSITIVITY)
Troponin I (High Sensitivity): 16 ng/L (ref <18)
Troponin I (High Sensitivity): 13 ng/L (ref <18)

## 2022-01-07 LAB — HEMOGLOBIN A1C
Hgb A1c MFr Bld: 6.7 % — ABNORMAL HIGH (ref 4.8–5.6)
Mean Plasma Glucose: 145.59 mg/dL

## 2022-01-07 LAB — HEPARIN LEVEL (UNFRACTIONATED)
Heparin Unfractionated: >1.1 IU/mL — ABNORMAL HIGH (ref 0.30–0.70)
Heparin Unfractionated: >1.1 IU/mL — ABNORMAL HIGH (ref 0.30–0.70)

## 2022-01-07 LAB — GLUCOSE, CAPILLARY
Glucose-Capillary: 268 mg/dL — ABNORMAL HIGH (ref 70–99)
Glucose-Capillary: 212 mg/dL — ABNORMAL HIGH (ref 70–99)

## 2022-01-07 LAB — APTT
aPTT: 24 seconds (ref 24–36)
aPTT: 61 seconds — ABNORMAL HIGH (ref 24–36)

## 2022-01-07 LAB — CBG MONITORING, ED
Glucose-Capillary: 334 mg/dL — ABNORMAL HIGH (ref 70–99)
Glucose-Capillary: 372 mg/dL — ABNORMAL HIGH (ref 70–99)

## 2022-01-07 MED ORDER — PROCHLORPERAZINE EDISYLATE 10 MG/2ML IJ SOLN
10.0000 mg | Freq: Four times a day (QID) | INTRAMUSCULAR | Status: DC | PRN
  Administered 2022-01-07 – 2022-01-26 (×16): 10 mg via INTRAVENOUS
  Filled 2022-01-07 (×17): qty 2

## 2022-01-07 MED ORDER — HEPARIN (PORCINE) 25000 UT/250ML-% IV SOLN
1450.0000 [IU]/h | INTRAVENOUS | Status: DC
  Administered 2022-01-07: 1450 [IU]/h via INTRAVENOUS
  Filled 2022-01-07: qty 250

## 2022-01-07 MED ORDER — SODIUM CHLORIDE 0.9 % IV SOLN
12.5000 mg | Freq: Four times a day (QID) | INTRAVENOUS | Status: DC | PRN
  Administered 2022-01-22: 12.5 mg via INTRAVENOUS
  Filled 2022-01-07: qty 12.5
  Filled 2022-01-07 (×2): qty 0.5

## 2022-01-07 MED ORDER — FENTANYL CITRATE PF 50 MCG/ML IJ SOSY
50.0000 ug | PREFILLED_SYRINGE | Freq: Once | INTRAMUSCULAR | Status: AC
  Administered 2022-01-07: 50 ug via INTRAVENOUS
  Filled 2022-01-07: qty 1

## 2022-01-07 MED ORDER — METRONIDAZOLE 500 MG/100ML IV SOLN
500.0000 mg | Freq: Once | INTRAVENOUS | Status: AC
  Administered 2022-01-07: 500 mg via INTRAVENOUS
  Filled 2022-01-07: qty 100

## 2022-01-07 MED ORDER — SODIUM CHLORIDE 0.9 % IV BOLUS
1000.0000 mL | Freq: Once | INTRAVENOUS | Status: AC
  Administered 2022-01-07: 1000 mL via INTRAVENOUS

## 2022-01-07 MED ORDER — INSULIN ASPART 100 UNIT/ML IJ SOLN
0.0000 [IU] | Freq: Three times a day (TID) | INTRAMUSCULAR | Status: DC
  Administered 2022-01-07: 11 [IU] via SUBCUTANEOUS
  Administered 2022-01-07: 20 [IU] via SUBCUTANEOUS
  Administered 2022-01-08 (×2): 4 [IU] via SUBCUTANEOUS
  Administered 2022-01-08 – 2022-01-09 (×4): 7 [IU] via SUBCUTANEOUS
  Administered 2022-01-10 – 2022-01-11 (×5): 4 [IU] via SUBCUTANEOUS
  Administered 2022-01-11: 7 [IU] via SUBCUTANEOUS
  Administered 2022-01-12 (×2): 4 [IU] via SUBCUTANEOUS
  Administered 2022-01-12: 7 [IU] via SUBCUTANEOUS
  Administered 2022-01-13: 3 [IU] via SUBCUTANEOUS
  Administered 2022-01-13 – 2022-01-15 (×6): 4 [IU] via SUBCUTANEOUS
  Administered 2022-01-15: 3 [IU] via SUBCUTANEOUS
  Administered 2022-01-15: 4 [IU] via SUBCUTANEOUS
  Administered 2022-01-16 (×2): 3 [IU] via SUBCUTANEOUS
  Administered 2022-01-17: 4 [IU] via SUBCUTANEOUS
  Administered 2022-01-17: 7 [IU] via SUBCUTANEOUS
  Administered 2022-01-17: 4 [IU] via SUBCUTANEOUS
  Administered 2022-01-18 (×2): 7 [IU] via SUBCUTANEOUS
  Administered 2022-01-18: 4 [IU] via SUBCUTANEOUS
  Administered 2022-01-19 (×2): 11 [IU] via SUBCUTANEOUS
  Administered 2022-01-19: 7 [IU] via SUBCUTANEOUS
  Administered 2022-01-20 (×2): 11 [IU] via SUBCUTANEOUS
  Administered 2022-01-20: 15 [IU] via SUBCUTANEOUS
  Administered 2022-01-21: 4 [IU] via SUBCUTANEOUS
  Administered 2022-01-21: 11 [IU] via SUBCUTANEOUS
  Administered 2022-01-21 – 2022-01-22 (×2): 7 [IU] via SUBCUTANEOUS
  Administered 2022-01-22 (×2): 11 [IU] via SUBCUTANEOUS
  Administered 2022-01-23: 7 [IU] via SUBCUTANEOUS
  Administered 2022-01-23 (×2): 15 [IU] via SUBCUTANEOUS
  Administered 2022-01-24: 4 [IU] via SUBCUTANEOUS
  Administered 2022-01-24: 3 [IU] via SUBCUTANEOUS
  Administered 2022-01-24 – 2022-01-25 (×2): 4 [IU] via SUBCUTANEOUS
  Administered 2022-01-25: 7 [IU] via SUBCUTANEOUS
  Administered 2022-01-26 (×2): 3 [IU] via SUBCUTANEOUS
  Administered 2022-01-26: 4 [IU] via SUBCUTANEOUS
  Filled 2022-01-07: qty 0.2

## 2022-01-07 MED ORDER — SODIUM CHLORIDE 0.9 % IV SOLN
2.0000 g | Freq: Two times a day (BID) | INTRAVENOUS | Status: DC
  Administered 2022-01-07 – 2022-01-10 (×6): 2 g via INTRAVENOUS
  Filled 2022-01-07 (×6): qty 2

## 2022-01-07 MED ORDER — LACTATED RINGERS IV BOLUS
1000.0000 mL | Freq: Once | INTRAVENOUS | Status: AC
Start: 2022-01-07 — End: 2022-01-07
  Administered 2022-01-07: 1000 mL via INTRAVENOUS

## 2022-01-07 MED ORDER — INSULIN ASPART 100 UNIT/ML IJ SOLN
0.0000 [IU] | Freq: Every day | INTRAMUSCULAR | Status: DC
  Administered 2022-01-07: 2 [IU] via SUBCUTANEOUS
  Administered 2022-01-08: 3 [IU] via SUBCUTANEOUS
  Administered 2022-01-19 – 2022-01-20 (×2): 2 [IU] via SUBCUTANEOUS
  Filled 2022-01-07: qty 0.05

## 2022-01-07 MED ORDER — METRONIDAZOLE 500 MG/100ML IV SOLN
500.0000 mg | Freq: Two times a day (BID) | INTRAVENOUS | Status: DC
  Administered 2022-01-07 – 2022-01-12 (×10): 500 mg via INTRAVENOUS
  Filled 2022-01-07 (×10): qty 100

## 2022-01-07 MED ORDER — SODIUM CHLORIDE 0.9 % IV SOLN: INTRAVENOUS | Status: DC

## 2022-01-07 MED ORDER — SODIUM CHLORIDE 0.9 % IV SOLN
2.0000 g | Freq: Once | INTRAVENOUS | Status: AC
  Administered 2022-01-07: 2 g via INTRAVENOUS
  Filled 2022-01-07: qty 2

## 2022-01-07 MED ORDER — ONDANSETRON HCL 4 MG/2ML IJ SOLN
4.0000 mg | Freq: Once | INTRAMUSCULAR | Status: AC
  Administered 2022-01-07: 4 mg via INTRAVENOUS
  Filled 2022-01-07: qty 2

## 2022-01-07 MED ORDER — PANTOPRAZOLE SODIUM 40 MG IV SOLR
40.0000 mg | Freq: Two times a day (BID) | INTRAVENOUS | Status: DC
  Administered 2022-01-07 – 2022-01-21 (×30): 40 mg via INTRAVENOUS
  Filled 2022-01-07 (×3): qty 40
  Filled 2022-01-07: qty 10
  Filled 2022-01-07: qty 40
  Filled 2022-01-07 (×8): qty 10
  Filled 2022-01-07: qty 40
  Filled 2022-01-07: qty 10
  Filled 2022-01-07: qty 40
  Filled 2022-01-07: qty 10
  Filled 2022-01-07 (×3): qty 40
  Filled 2022-01-07 (×2): qty 10
  Filled 2022-01-07: qty 40
  Filled 2022-01-07 (×6): qty 10
  Filled 2022-01-07 (×2): qty 40

## 2022-01-07 MED ORDER — HEPARIN (PORCINE) 25000 UT/250ML-% IV SOLN
1550.0000 [IU]/h | INTRAVENOUS | Status: DC
  Administered 2022-01-08: 1550 [IU]/h via INTRAVENOUS
  Filled 2022-01-07 (×2): qty 250

## 2022-01-07 MED ORDER — METOPROLOL TARTRATE 5 MG/5ML IV SOLN
5.0000 mg | Freq: Four times a day (QID) | INTRAVENOUS | Status: DC | PRN
  Administered 2022-01-07: 5 mg via INTRAVENOUS
  Filled 2022-01-07: qty 5

## 2022-01-07 NOTE — ED Notes (Signed)
Requested patient to urinate. 

## 2022-01-07 NOTE — Progress Notes (Signed)
Pharmacy Antibiotic Note  Leslie Mullen is a 61 y.o. female admitted on 01/07/2022 with sepsis secondary to pancreatic infection vs LLL PNA  .  Pharmacy has been consulted for cefepime dosing.  Plan: Cefepime 2 gm IV q12 Flagyl 500 mg IV q12 per MD F/u renal function, WBC, temp, culture data  Height: 5\' 8"  (172.7 cm) Weight: 136.1 kg (300 lb) IBW/kg (Calculated) : 63.9  Temp (24hrs), Avg:97.7 F (36.5 C), Min:97.7 F (36.5 C), Max:97.7 F (36.5 C)  Recent Labs  Lab 01/01/22 0914 01/01/22 1206 01/02/22 0127 01/03/22 0301 01/04/22 0414 01/05/22 0104 01/07/22 0556 01/07/22 0558  WBC  --   --  17.7* 9.5 5.1 6.7 15.3*  --   CREATININE  --   --  1.56* 1.45* 1.28* 1.20*  --  1.69*  LATICACIDVEN 2.2* 1.5  --   --   --   --  2.3*  --     Estimated Creatinine Clearance: 52.5 mL/min (A) (by C-G formula based on SCr of 1.69 mg/dL (H)).    Allergies  Allergen Reactions   Penicillins Shortness Of Breath, Itching and Swelling    Has patient had a PCN reaction causing immediate rash, facial/tongue/throat swelling, SOB or lightheadedness with hypotension: Yes Has patient had a PCN reaction causing severe rash involving mucus membranes or skin necrosis:No Has patient had a PCN reaction that required hospitalization: No Has patient had a PCN reaction occurring within the last 10 years: no If all of the above answers are "NO", then may proceed with Cephalosporin use.  *Per patient, tolerated Keflex in the past      Antimicrobials this admission: 2/1 cefepime>> 2/1 Flagyl>> PTA: 1/26 CTX>>1/30 1/27 vanc> 1/30 Dose adjustments this admission:   Microbiology results: 2/1 BCx2: sent PTA: 1/27 BCx2: ngtd 1/26 Bcx NGF 1/26 UCx > 100K mult sp F  Thank you for allowing pharmacy to be a part of this patients care.  Eudelia Bunch, Pharm.D 01/07/2022 9:37 AM

## 2022-01-07 NOTE — Progress Notes (Signed)
Inpatient Diabetes Program Recommendations  AACE/ADA: New Consensus Statement on Inpatient Glycemic Control (2015)  Target Ranges:  Prepandial:   less than 140 mg/dL      Peak postprandial:   less than 180 mg/dL (1-2 hours)      Critically ill patients:  140 - 180 mg/dL   Lab Results  Component Value Date   GLUCAP 372 (H) 01/07/2022   HGBA1C 6.7 (H) 01/07/2022    Review of Glycemic Control  Diabetes history: DM2 Outpatient Diabetes medications: Synjardy 12.04/999 mg BID Current orders for Inpatient glycemic control: Novolog 0-20 TID with meals and 0-5 HS  Pt is NPO Hx pancreatic ca AG - > 20  Inpatient Diabetes Program Recommendations:    IV insulin per [facility] for hyperglycemia  Follow closely.  Thank you. Lorenda Peck, RD, LDN, CDE Inpatient Diabetes Coordinator [phone]

## 2022-01-07 NOTE — ED Notes (Signed)
Pt placed on purewick to obtain urine specimen when pt is able to provide one

## 2022-01-07 NOTE — Progress Notes (Signed)
Leslie Mullen   DOB:03/18/1961   CB#:[id number]   OHY#:[id number]  Oncology follow up note   Subjective: Pt is well-known to me, under my care for her metastatic pancreatic cancer.  This is her third hospital admission in the past one month. She was admitted for gastric outlet obstruction.  She has NG tube with suction, feels better today. Her son-in-law was at the bed side when I saw her in the ED.  Objective:  Vitals:   01/07/22 1600 01/07/22 1712  BP: (!) 187/91 (!) 145/75  Pulse: (!) 111 86  Resp: 14 18  Temp:  98.2 F (36.8 C)  SpO2: 98% 94%    Body mass index is 45.61 kg/m.  Intake/Output Summary (Last 24 hours) at 01/07/2022 2001 Last data filed at 01/07/2022 1754 Gross per 24 hour  Intake 526.73 ml  Output --  Net 526.73 ml      Sclerae unicteric  Port site is covered, with soaked gauze   No peripheral adenopathy  Lungs clear -- no rales or rhonchi  Heart regular rate and rhythm  Abdomen soft  MSK no focal spinal tenderness, no peripheral edema  Neuro nonfocal   CBG (last 3)  Recent Labs    01/07/22 0459 01/07/22 1251 01/07/22 1801  GLUCAP 334* 372* 268*     Labs:  Urine Studies No results for input(s): UHGB, CRYS in the last 72 hours.  Invalid input(s): UACOL, UAPR, USPG, UPH, UTP, UGL, UKET, UBIL, UNIT, UROB, [place], UEPI, UWBC, Junie Panning [place], [place], [place]  Basic Metabolic Panel: Recent Labs  Lab 01/02/22 0127 01/03/22 0301 01/04/22 0414 01/05/22 0104 01/07/22 0558  NA 134* 134* 138 139 141  K 4.1 4.3 3.9 3.8 3.8  CL 102 105 111 106 93*  CO2 25 24 22 24 25   GLUCOSE 91 196* 178* 175* 376*  BUN 17 17 13 11  24*  CREATININE 1.56* 1.45* 1.28* 1.20* 1.69*  CALCIUM 7.4* 6.9* 7.1* 7.7* 8.9  MG  --  1.8 1.9 2.0  --    GFR Estimated Creatinine Clearance: 52.5 mL/min (A) (by C-G formula based on SCr of 1.69 mg/dL (H)). Liver Function Tests: Recent Labs  Lab 01/01/22 0812 01/03/22 0301 01/05/22 0104 01/07/22 0558  AST 23 16 17 17   ALT 11 9 10 13    ALKPHOS 109 80 82 86  BILITOT 0.5 0.2* 0.2* 0.8  PROT 7.7 5.1* 6.0* 8.2*  ALBUMIN 1.9* <1.5* <1.5* 2.3*   Recent Labs  Lab 01/01/22 0812 01/07/22 0558  LIPASE 23 43   No results for input(s): AMMONIA in the last 168 hours. Coagulation profile Recent Labs  Lab 01/01/22 0812 01/07/22 0558  INR 1.3* 1.6*    CBC: Recent Labs  Lab 01/01/22 0812 01/02/22 0127 01/03/22 0301 01/04/22 0414 01/05/22 0104 01/07/22 0556  WBC 24.9* 17.7* 9.5 5.1 6.7 15.3*  NEUTROABS 23.8* 15.6* 7.7 3.1 4.3  --   HGB 12.0 8.9* 7.3* 8.3* 8.0* 10.6*  HCT 38.8 27.9* 23.6* 25.9* 25.3* 33.1*  MCV 91.1 90.0 91.1 90.9 91.7 90.2  PLT 397 228 208 180 184 294   Cardiac Enzymes: Recent Labs  Lab 01/02/22 0127  CKTOTAL 82   BNP: Invalid input(s): POCBNP CBG: Recent Labs  Lab 01/05/22 0845 01/05/22 1133 01/07/22 0459 01/07/22 1251 01/07/22 1801  GLUCAP 153* 190* 334* 372* 268*   D-Dimer No results for input(s): DDIMER in the last 72 hours. Hgb A1c Recent Labs    01/07/22 0559  HGBA1C 6.7*   Lipid Profile No results for  input(s): CHOL, HDL, LDLCALC, TRIG, CHOLHDL, LDLDIRECT in the last 72 hours. Thyroid function studies No results for input(s): TSH, T4TOTAL, T3FREE, THYROIDAB in the last 72 hours.  Invalid input(s): FREET3  Anemia work up No results for input(s): VITAMINB12, FOLATE, FERRITIN, TIBC, IRON, RETICCTPCT in the last 72 hours. Microbiology Recent Results (from the past 240 hour(s))  Resp Panel by RT-PCR (Flu A&B, Covid) Nasopharyngeal Swab     Status: None   Collection Time: 01/01/22  8:16 AM   Specimen: Nasopharyngeal Swab; Nasopharyngeal(NP) swabs in vial transport medium  Result Value Ref Range Status   SARS Coronavirus 2 by RT PCR NEGATIVE NEGATIVE Final    Comment: (NOTE) SARS-CoV-2 target nucleic acids are NOT DETECTED.  The SARS-CoV-2 RNA is generally detectable in upper respiratory specimens during the acute phase of infection. The lowest concentration of  SARS-CoV-2 viral copies this assay can detect is 138 copies/mL. A negative result does not preclude SARS-Cov-2 infection and should not be used as the sole basis for treatment or other patient management decisions. A negative result may occur with  improper specimen collection/handling, submission of specimen other than nasopharyngeal swab, presence of viral mutation(s) within the areas targeted by this assay, and inadequate number of viral copies(<138 copies/mL). A negative result must be combined with clinical observations, patient history, and epidemiological information. The expected result is Negative.  Fact Sheet for Patients:  EntrepreneurPulse.com.au  Fact Sheet for Healthcare Providers:  IncredibleEmployment.be  This test is no t yet approved or cleared by the [place] FDA and  has been authorized for detection and/or diagnosis of SARS-CoV-2 by FDA under an Emergency Use Authorization (EUA). This EUA will remain  in effect (meaning this test can be used) for the duration of the COVID-19 declaration under Section 564(b)(1) of the Act, 21 U.S.C.section 360bbb-3(b)(1), unless the authorization is terminated  or revoked sooner.       Influenza A by PCR NEGATIVE NEGATIVE Final   Influenza B by PCR NEGATIVE NEGATIVE Final    Comment: (NOTE) The Xpert Xpress SARS-CoV-2/FLU/RSV plus assay is intended as an aid in the diagnosis of influenza from Nasopharyngeal swab specimens and should not be used as a sole basis for treatment. Nasal washings and aspirates are unacceptable for Xpert Xpress SARS-CoV-2/FLU/RSV testing.  Fact Sheet for Patients: EntrepreneurPulse.com.au  Fact Sheet for Healthcare Providers: IncredibleEmployment.be  This test is not yet approved or cleared by the [place] FDA and has been authorized for detection and/or diagnosis of SARS-CoV-2 by FDA under an Emergency Use  Authorization (EUA). This EUA will remain in effect (meaning this test can be used) for the duration of the COVID-19 declaration under Section 564(b)(1) of the Act, 21 U.S.C. section 360bbb-3(b)(1), unless the authorization is terminated or revoked.  Performed at [facility], [place] [address] [place], [place] [address]   Urine Culture     Status: Abnormal   Collection Time: 01/01/22  8:43 AM   Specimen: Urine, Clean Catch  Result Value Ref Range Status   Specimen Description URINE, CLEAN CATCH  Final   Special Requests   Final    NONE Performed at [facility], [place] [address] [place], [place] [address]    Culture (A)  Final    >=100,000 COLONIES/mL MULTIPLE SPECIES PRESENT, SUGGEST RECOLLECTION   Report Status 01/02/2022 FINAL  Final  Culture, blood (routine x 2)     Status: None   Collection Time: 01/01/22 11:52 AM   Specimen: BLOOD  Result Value Ref  Range Status   Specimen Description BLOOD BLOOD RIGHT ARM  Final   Special Requests   Final    BOTTLES DRAWN AEROBIC AND ANAEROBIC Blood Culture results may not be optimal due to an inadequate volume of blood received in culture bottles   Culture   Final    NO GROWTH 5 DAYS Performed at [facility], [place] [address], [place], [place] [address]    Report Status 01/06/2022 FINAL  Final  Culture, blood (routine x 2)     Status: None   Collection Time: 01/02/22  1:27 AM   Specimen: BLOOD  Result Value Ref Range Status   Specimen Description BLOOD SITE NOT SPECIFIED  Final   Special Requests   Final    AEROBIC BOTTLE ONLY Blood Culture results may not be optimal due to an inadequate volume of blood received in culture bottles   Culture   Final    NO GROWTH 5 DAYS Performed at [facility], [place] [address], [place], [place] [address]    Report Status 01/07/2022 FINAL  Final  Blood Culture (routine x 2)     Status: None (Preliminary result)   Collection Time: 01/07/22  5:59 AM   Specimen: Site Not  Specified; Blood  Result Value Ref Range Status   Specimen Description   Final    SITE NOT SPECIFIED Performed at [facility] [address] [place], [place] [address]    Special Requests   Final    BOTTLES DRAWN AEROBIC AND ANAEROBIC Blood Culture adequate volume Performed at [facility] [address] [place], [place] [address]    Culture   Final    NO GROWTH < 12 HOURS Performed at [facility] [address] [place], [place] [address]    Report Status PENDING  Incomplete  Resp Panel by RT-PCR (Flu A&B, Covid) Nasopharyngeal Swab     Status: None   Collection Time: 01/07/22  8:38 AM   Specimen: Nasopharyngeal Swab; Nasopharyngeal(NP) swabs in vial transport medium  Result Value Ref Range Status   SARS Coronavirus 2 by RT PCR NEGATIVE NEGATIVE Final    Comment: (NOTE) SARS-CoV-2 target nucleic acids are NOT DETECTED.  The SARS-CoV-2 RNA is generally detectable in upper respiratory specimens during the acute phase of infection. The lowest concentration of SARS-CoV-2 viral copies this assay can detect is 138 copies/mL. A negative result does not preclude SARS-Cov-2 infection and should not be used as the sole basis for treatment or other patient management decisions. A negative result may occur with  improper specimen collection/handling, submission of specimen other than nasopharyngeal swab, presence of viral mutation(s) within the areas targeted by this assay, and inadequate number of viral copies(<138 copies/mL). A negative result must be combined with clinical observations, patient history, and epidemiological information. The expected result is Negative.  Fact Sheet for Patients:  EntrepreneurPulse.com.au  Fact Sheet for Healthcare Providers:  IncredibleEmployment.be  This test is no t yet approved or cleared by the [place] FDA and  has been authorized for detection and/or  diagnosis of SARS-CoV-2 by FDA under an Emergency Use Authorization (EUA). This EUA will remain  in effect (meaning this test can be used) for the duration of the COVID-19 declaration under Section 564(b)(1) of the Act, 21 U.S.C.section 360bbb-3(b)(1), unless the authorization is terminated  or revoked sooner.       Influenza A by PCR NEGATIVE NEGATIVE Final   Influenza B by PCR NEGATIVE NEGATIVE Final  Comment: (NOTE) The Xpert Xpress SARS-CoV-2/FLU/RSV plus assay is intended as an aid in the diagnosis of influenza from Nasopharyngeal swab specimens and should not be used as a sole basis for treatment. Nasal washings and aspirates are unacceptable for Xpert Xpress SARS-CoV-2/FLU/RSV testing.  Fact Sheet for Patients: EntrepreneurPulse.com.au  Fact Sheet for Healthcare Providers: IncredibleEmployment.be  This test is not yet approved or cleared by the [place] FDA and has been authorized for detection and/or diagnosis of SARS-CoV-2 by FDA under an Emergency Use Authorization (EUA). This EUA will remain in effect (meaning this test can be used) for the duration of the COVID-19 declaration under Section 564(b)(1) of the Act, 21 U.S.C. section 360bbb-3(b)(1), unless the authorization is terminated or revoked.  Performed at [facility], [place] [address], [place], [place] [address]       Studies:  CT ABDOMEN PELVIS WO CONTRAST  Result Date: 01/07/2022 CLINICAL DATA:  Metastatic pancreatic cancer. Hyperglycemia. Possible small bowel obstruction. EXAM: CT ABDOMEN AND PELVIS WITHOUT CONTRAST TECHNIQUE: Multidetector CT imaging of the abdomen and pelvis was performed following the standard protocol without IV contrast. RADIATION DOSE REDUCTION: This exam was performed according to the departmental dose-optimization program which includes automated exposure control, adjustment of the mA and/or kV according to patient size  and/or use of iterative reconstruction technique. COMPARISON:  10/25/2021 FINDINGS: Lower chest: Ground-glass opacity seen in the posterior right lower lobe previously has become more confluent and organized in the interval with a masslike character today measuring 2.2 x 1.5 cm. There is patchy airspace disease in the lingula and left lower lobe, new in the interval. Hepatobiliary: No focal abnormality in the liver on this study without intravenous contrast. The liver shows diffusely decreased attenuation suggesting fat deposition. Increased attenuation of contents in the gallbladder lumen may be related to sludge. No intrahepatic or extrahepatic biliary dilation. Pancreas: Interval decrease in size of the pancreatic head mass. Lesion is poorly defined on today's noncontrast study but measures approximately 4.1 x 4.0 cm compared to 6.0 x 5.3 cm previously. There is some trace gas in the region of the mass lesion today which could represent necrosis although superinfection cannot be excluded. Spleen: No splenomegaly. No focal mass lesion. Adrenals/Urinary Tract: No adrenal nodule or mass. Kidneys unremarkable. No evidence for hydroureter. Bladder is decompressed. Stomach/Bowel: Stomach is markedly distended with fluid. Duodenum is decompressed and small bowel loops are diffusely decompressed throughout. The terminal ileum is normal. The appendix is normal. Colon is decompressed with mild diverticular disease on the left. Vascular/Lymphatic: There is mild atherosclerotic calcification of the abdominal aorta without aneurysm. There is no gastrohepatic or hepatoduodenal ligament lymphadenopathy. No retroperitoneal or mesenteric lymphadenopathy. No pelvic sidewall lymphadenopathy. Reproductive: IUD is visualized in the uterus. There is no adnexal mass. Other: No intraperitoneal free fluid. Musculoskeletal: Small umbilical hernia contains only fat. No worrisome lytic or sclerotic osseous abnormality. IMPRESSION: 1.  Interval decrease in size of the pancreatic head mass, poorly demonstrated on today's noncontrast CT. Trace gas in the region of the mass lesion today could represent necrosis although superinfection cannot be excluded. 2. Marked distention of the stomach with fluid. Duodenum is decompressed and small bowel loops are diffusely decompressed throughout. Imaging features are compatible with gastric outlet obstruction, likely secondary to involvement by the pancreatic head lesion. 3. Interval development of patchy airspace disease in the lingula and left lower lobe. Imaging features are compatible with infection/inflammation. 4. Ground-glass opacity in the posterior right lower lobe has become more confluent and organized  in the interval with a masslike character today measuring 2.2 x 1.5 cm. While likely infectious/inflammatory, close follow-up recommended. 5. Hepatic steatosis. 6. Small umbilical hernia contains only fat. 7. Aortic Atherosclerosis (ICD10-I70.0). Electronically Signed   By: Misty Stanley M.D.   On: 01/07/2022 06:55   DG Chest Port 1 View  Result Date: 01/07/2022 CLINICAL DATA:  Possible sepsis. EXAM: PORTABLE CHEST 1 VIEW COMPARISON:  01/01/2022 FINDINGS: 0605 hours low lung volumes. The cardio pericardial silhouette is enlarged. There is pulmonary vascular congestion without overt pulmonary edema. No evidence for pleural effusion. Prominent gastric bubble noted under the left hemidiaphragm. Left permanent pacemaker noted. Telemetry leads overlie the chest. IMPRESSION: Low volume chest x-ray with pulmonary vascular congestion. Electronically Signed   By: Misty Stanley M.D.   On: 01/07/2022 06:33    Assessment: 61 y.o. female   Gastric outlet obstruction, likely secondary to pancreatic mass Pancreatic cancer with liver metastasis, on chemotherapy, last dose 12/29/2021 Sepsis secondary to pneumonia  PAF DM Obesity  CKD     Plan:  -lab and CT scan reviewed, her gastric outlet obstruction  is likely secondary to pancreatic mass. -Due to her metastatic pancreatic cancer, and poor tolerance to chemotherapy, her prognosis is very poor.  She is not a candidate for more cytotoxic chemotherapy, the goal of care at this point is palliative and comfort.  I discussed the [place] with the patient today, she voiced good understanding, and is in agreement with no more chemo.  I suggest home care on discharge. I recommend hospice care at home, explained to her the difference between regular home care, palliative care and hospice.  She will think about it and discuss with family.  -I reached out to GI teams about duodenal stent placement.  I appreciate Dr. Perley Jain input and she is scheduled for EGD and possible stent placement tomorrow. -I have spoken to general surgery this morning.  If stenting is not feasible, we can consider venting G-tube for comfort, and J tube feeding through PEG if possible. I think she is a poor candidate for bypass surgery.  -I will f/u.   Truitt Merle, MD 01/07/2022

## 2022-01-07 NOTE — Sepsis Progress Note (Signed)
eLink monitoring code sepsis.  

## 2022-01-07 NOTE — H&P (Signed)
History and Physical    Patient: Leslie Mullen GNF:[id number] DOB: 08-04-1961 DOA: 01/07/2022 DOS: the patient was seen and examined on 01/07/2022 PCP: Sue Lush, PA-C  Patient coming from: Home  Chief Complaint:  Chief Complaint  Patient presents with   Hyperglycemia    HPI: Leslie Mullen is a 61 y.o. female with medical history significant of CHB s/p PPM, DM2, HTN, metastatic pancreatic cancer, endometrial cancer, morbid obesity. Presenting with N/V. She was recently hospitalized from 1/26 - 1/30  for sepsis secondary to port infection. At the time she was sent home on 5 days of oral doxy. Bld Cx were negative. She had episodes of hypoglycemia and was sent home on just her synjardy. She reports that she was ok until yesterday. She acutely had N/V. She was unable to keep anything down. She tried compazine and anti-acids, but this didn't help. She started feeling dizzy by the end of the day. She woke early this morning with more N/V. She checked her sugars and they were high. She became concerned and came to the ED.   She reports not having any BMs yesterday or today. She denies any other aggravating or alleviating factors.   Review of Systems: As mentioned in the history of present illness. All other systems reviewed and are negative. Past Medical History:  Diagnosis Date   Abnormal uterine bleeding    Allergy    Anemia    Asthma    Cataract    BOTH EYES   Complete heart block ([place])    a. Medtronic PPM 10/2020.   Diabetes ([place])    type II    Diabetic neuropathy ([place])    Endometrial cancer ([place])    Family history of colon cancer    GERD (gastroesophageal reflux disease)    Hyperlipidemia    Hypertension    Morbid obesity ([place])    Osteoarthritis    right knee    Pancreatitis    Pneumonia    Past Surgical History:  Procedure Laterality Date   CERVICAL BIOPSY     CESAREAN SECTION     x3   COLONOSCOPY WITH PROPOFOL N/A 06/29/2017   Procedure: COLONOSCOPY WITH PROPOFOL;   Surgeon: Irene Shipper, MD;  Location: [facility];  Service: Endoscopy;  Laterality: N/A;   CYSTECTOMY Right 2010   Cyst removed from right groin.    DILATION AND CURETTAGE OF UTERUS N/A 07/29/2021   Procedure: DILATATION AND CURETTAGE OF UTERUS;  Surgeon: Everitt Amber, MD;  Location: [facility];  Service: Gynecology;  Laterality: N/A;   INTRAUTERINE DEVICE (IUD) INSERTION N/A 07/29/2021   Procedure: INTRAUTERINE DEVICE (IUD) INSERTION;  Surgeon: Everitt Amber, MD;  Location: [facility];  Service: Gynecology;  Laterality: N/A;  IUD FROM MAIN PHARMACY   IR IMAGING GUIDED PORT INSERTION  11/11/2021   IR REMOVAL TUN ACCESS W/ PORT W/O FL MOD SED  01/02/2022   PACEMAKER IMPLANT N/A 10/11/2020   Procedure: PACEMAKER IMPLANT;  Surgeon: Vickie Epley, MD;  Location: [facility];  Service: Cardiovascular;  Laterality: N/A;   TONSILLECTOMY     Social History:  reports that she has quit smoking. Her smoking use included cigarettes. She has never used smokeless tobacco. She reports that she does not drink alcohol and does not use drugs.  Allergies  Allergen Reactions   Penicillins Shortness Of Breath, Itching and Swelling    Has patient had a PCN reaction causing immediate rash, facial/tongue/throat swelling, SOB or lightheadedness with hypotension: Yes Has patient had a  PCN reaction causing severe rash involving mucus membranes or skin necrosis:No Has patient had a PCN reaction that required hospitalization: No Has patient had a PCN reaction occurring within the last 10 years: no If all of the above answers are "NO", then may proceed with Cephalosporin use.  *Per patient, tolerated Keflex in the past      Family History  Adopted: Yes  Problem Relation Age of Onset   Diabetes Father    Heart disease Father    Cancer Mother        colon   Colon cancer Neg Hx    Esophageal cancer Neg Hx    Rectal cancer Neg Hx    Stomach cancer Neg Hx     Prior to Admission medications   Medication Sig  Start Date End Date Taking? Authorizing Provider  acetaminophen (TYLENOL) 500 MG tablet Take 1,000 mg by mouth every 6 (six) hours as needed for moderate pain.    [provider]  amLODipine (NORVASC) 10 MG tablet Take 1 tablet (10 mg total) by mouth at bedtime. 01/03/21   [name], MD  apixaban (ELIQUIS) 5 MG TABS tablet Take 1 tablet (5 mg total) by mouth 2 (two) times daily. 12/23/21   Little Ishikawa, MD  bisacodyl (DULCOLAX) 5 MG EC tablet Take 1 tablet (5 mg total) by mouth daily as needed for severe constipation. 01/05/22   Aline August, MD  doxycycline (ADOXA) 100 MG tablet Take 1 tablet (100 mg total) by mouth 2 (two) times daily for 5 days. 01/05/22 01/10/22  Aline August, MD  gabapentin (NEURONTIN) 300 MG capsule Take 1 capsule (300 mg total) by mouth 2 (two) times daily. 10/12/20   Evans Lance, MD  lidocaine-prilocaine (EMLA) cream Apply to affected area once Patient taking differently: Apply 1 application topically daily as needed (port access). 10/14/21   Truitt Merle, MD  loratadine (CLARITIN) 10 MG tablet Take 10 mg by mouth daily as needed for allergies.    [provider]  LORazepam (ATIVAN) 0.5 MG tablet Take 1 tablet (0.5 mg total) by mouth 2 (two) times daily as needed (for anxiety, and/or chemo-related nausea/vomiting). 11/13/21   Alla Feeling, NP  magic mouthwash w/lidocaine SOLN Take 5 mLs by mouth 4 (four) times daily as needed for mouth pain. 01/05/22   Aline August, MD  Menthol, Topical Analgesic, (BIOFREEZE ROLL-ON EX) Apply 1 application topically as needed (pain).    [provider]  mirtazapine (REMERON) 15 MG tablet Take 1 tablet (15 mg total) by mouth at bedtime. 12/11/21   Truitt Merle, MD  Multiple Vitamin (MULTIVITAMIN) capsule Take 1 capsule by mouth daily.    [provider]  omeprazole (PRILOSEC) 20 MG capsule Take 20 mg by mouth daily. 12/18/21   [provider]  ondansetron (ZOFRAN) 8 MG tablet TAKE 1  TABLET(8 MG) BY MOUTH TWICE DAILY AS NEEDED FOR NAUSEA OR VOMITING 01/05/22   Truitt Merle, MD  Oxycodone HCl 10 MG TABS Take 1 tablet (10 mg total) by mouth every 6 (six) hours as needed. Patient taking differently: Take 10 mg by mouth every 6 (six) hours as needed (pain). 12/11/21   Truitt Merle, MD  polyethylene glycol (MIRALAX / GLYCOLAX) 17 g packet Take 17 g by mouth daily as needed for moderate constipation.    [provider]  pravastatin (PRAVACHOL) 40 MG tablet TAKE 1 TABLET(40 MG) BY MOUTH DAILY Patient taking differently: Take 40 mg by mouth daily. 07/23/20   Merri Ray  R, MD  prochlorperazine (COMPAZINE) 10 MG tablet TAKE 1 TABLET(10 MG) BY MOUTH EVERY 6 HOURS AS NEEDED FOR NAUSEA OR VOMITING Patient taking differently: Take 10 mg by mouth every 6 (six) hours as needed for nausea or vomiting. 12/16/21   Alla Feeling, NP  senna-docusate (SENOKOT-S) 8.6-50 MG tablet Take 1 tablet by mouth 2 (two) times daily. 01/05/22   Aline August, MD  SYNJARDY XR 12.04-999 MG TB24 Take 1 tablet by mouth 2 (two) times daily. 05/30/21   [provider]  trolamine salicylate (ASPERCREME) 10 % cream Apply 1 application topically 2 (two) times daily as needed for muscle pain.    [provider]    Physical Exam: Vitals:   01/07/22 0500 01/07/22 0502 01/07/22 0645 01/07/22 0730  BP:  (!) 155/103 (!) 153/91 (!) 178/93  Pulse:  (!) 116 (!) 108 (!) 102  Resp:  20 16 18   Temp:  97.7 F (36.5 C)    SpO2: 100% 99% 99% 100%  Weight: (!) 137.8 kg 136.1 kg    Height: 5\' 8"  (1.727 m) 5\' 8"  (1.727 m)     General: 61 y.o. female resting in bed in NAD Eyes: PERRL, normal sclera ENMT: Nares patent w/o discharge, orophaynx clear, dentition normal, ears w/o discharge/lesions/ulcers Neck: Supple, trachea midline Cardiovascular: RRR, +S1, S2, no m/g/r, equal pulses throughout Respiratory: CTABL, no w/r/r, normal WOB GI: BS hypoactive, NDNT, obese, no organomegaly noted MSK: No c/c; BLE  pedal edema Neuro: A&O x 3, no focal deficits Psyc: Appropriate interaction and affect, calm/cooperative   Data Reviewed:  Glucose: 376 Scr: 1.69 Anion Gap: >20 Lactic acid 2.3 WBC: 15.3 EKG: sinus tach, no st elevations  Assessment and Plan: No notes have been filed under this hospital service. Service: Hospitalist  Sepsis secondary to pancreatic infection vs LLL PNA     - admit to inpt, progressive     - follow Bld Cx     - continue cefepime, flagyl, fluids  Gastric outlet obstruction Pancreatic cancer     - EDP consulted general surgery; I spoke with onco; appreciate assistance     - place NGT     - palliative care consult     - fluids as above     - NPO for now  DM2     - SSI, glucose checks, NPO  AKI     - fluids as above     - CT w/o renal obstruction     - watch nephrotoxins  HTN     - NPO for now; will add PRNs  PAF     - NPO, start heparin gtt  Advance Care Planning: FULL CODE  Consults: EDP spoke with general surgery; I spoke with onco (Dr. Burr Medico)  Family Communication: w/ family at bedside  Severity of Illness: The appropriate patient status for this patient is INPATIENT. Inpatient status is judged to be reasonable and necessary in order to provide the required intensity of service to ensure the patient's safety. The patient's presenting symptoms, physical exam findings, and initial radiographic and laboratory data in the context of their chronic comorbidities is felt to place them at high risk for further clinical deterioration. Furthermore, it is not anticipated that the patient will be medically stable for discharge from the hospital within 2 midnights of admission.   * I certify that at the point of admission it is my clinical judgment that the patient will require inpatient hospital care spanning beyond 2 midnights from the point of  admission due to high intensity of service, high risk for further deterioration and high frequency of surveillance  required.*  Author: Jonnie Finner, DO 01/07/2022 8:04 AM  For on call review [email or web address].

## 2022-01-07 NOTE — Progress Notes (Signed)
ANTICOAGULATION CONSULT NOTE - Follow Up Consult  Pharmacy Consult for heparin Indication: atrial fibrillation- bridge therapy while NPO. On apixaban PTA  Allergies  Allergen Reactions   Penicillins Shortness Of Breath, Itching and Swelling    Has patient had a PCN reaction causing immediate rash, facial/tongue/throat swelling, SOB or lightheadedness with hypotension: Yes Has patient had a PCN reaction causing severe rash involving mucus membranes or skin necrosis:No Has patient had a PCN reaction that required hospitalization: No Has patient had a PCN reaction occurring within the last 10 years: no If all of the above answers are "NO", then may proceed with Cephalosporin use.  *Per patient, tolerated Keflex in the past      Patient Measurements: Height: 5\' 8"  (172.7 cm) Weight: 136.1 kg (300 lb) IBW/kg (Calculated) : 63.9 Heparin Dosing Weight: 96.7  Vital Signs: Temp: 98.2 F (36.8 C) (02/01 1712) Temp Source: Oral (02/01 1712) BP: 145/75 (02/01 1712) Pulse Rate: 86 (02/01 1712)  Labs: Recent Labs    01/05/22 0104 01/07/22 0556 01/07/22 0558 01/07/22 0838 01/07/22 0941 01/07/22 1843  HGB 8.0* 10.6*  --   --   --   --   HCT 25.3* 33.1*  --   --   --   --   PLT 184 294  --   --   --   --   APTT  --   --  24  --   --  61*  LABPROT  --   --  18.7*  --   --   --   INR  --   --  1.6*  --   --   --   HEPARINUNFRC  --   --   --   --  >1.10* >1.10*  CREATININE 1.20*  --  1.69*  --   --   --   TROPONINIHS  --   --  16 13  --   --      Estimated Creatinine Clearance: 52.5 mL/min (A) (by C-G formula based on SCr of 1.69 mg/dL (H)).   Medical History: Past Medical History:  Diagnosis Date   Abnormal uterine bleeding    Allergy    Anemia    Asthma    Cataract    BOTH EYES   Complete heart block ([place])    a. Medtronic PPM 10/2020.   Diabetes ([place])    type II    Diabetic neuropathy ([place])    Endometrial cancer ([place])    Family history of colon cancer    GERD  (gastroesophageal reflux disease)    Hyperlipidemia    Hypertension    Morbid obesity ([place])    Osteoarthritis    right knee    Pancreatitis    Pneumonia     Assessment: 61 yo F on apixaban 5 mg po BID PTA for PAF. She was just discharged from [facility] 1/30.  Pharmacy to dose heparin for bridge therapy while apixaban held for NPO/GOO.  Last dose apixaban 5 mg was 1/30 at 9 am (in hospital- pt reports she took no meds after DC home).   Today, 01/07/2022 First aPTT subtherapeutic 61 sec on heparin 1450 units/hr Heparin level > 1.1 due to effects of apixaban on board No bleeding or interruptions with infusion documented  Goal of Therapy:  Heparin level 0.3-0.7 units/ml Heparin level 66-102 units/ml Monitor platelets by anticoagulation protocol: Yes   Plan:  Increase heparin drip to 1550 units/hr Check aPTT 8 hrs after increasing rate Daily heparin level, aPTT and  CBC while on heparin  Peggyann Juba, PharmD, [facility]: [phone] 01/07/2022 7:47 PM

## 2022-01-07 NOTE — Sepsis Progress Note (Signed)
Notified bedside nurse of need to draw repeat lactic acid. 

## 2022-01-07 NOTE — Sepsis Progress Note (Signed)
Notified provider of need to order repeat lactic acid (3rd). .  

## 2022-01-07 NOTE — Progress Notes (Signed)
ANTICOAGULATION CONSULT NOTE - Initial Consult  Pharmacy Consult for heparin Indication: atrial fibrillation- bridge therapy while NPO. On apixaban PTA  Allergies  Allergen Reactions   Penicillins Shortness Of Breath, Itching and Swelling    Has patient had a PCN reaction causing immediate rash, facial/tongue/throat swelling, SOB or lightheadedness with hypotension: Yes Has patient had a PCN reaction causing severe rash involving mucus membranes or skin necrosis:No Has patient had a PCN reaction that required hospitalization: No Has patient had a PCN reaction occurring within the last 10 years: no If all of the above answers are "NO", then may proceed with Cephalosporin use.  *Per patient, tolerated Keflex in the past      Patient Measurements: Height: 5\' 8"  (172.7 cm) Weight: 136.1 kg (300 lb) IBW/kg (Calculated) : 63.9 Heparin Dosing Weight: 96.7  Vital Signs: Temp: 97.7 F (36.5 C) (02/01 0502) BP: 159/95 (02/01 0900) Pulse Rate: 116 (02/01 0900)  Labs: Recent Labs    01/05/22 0104 01/07/22 0556 01/07/22 0558  HGB 8.0* 10.6*  --   HCT 25.3* 33.1*  --   PLT 184 294  --   APTT  --   --  24  LABPROT  --   --  18.7*  INR  --   --  1.6*  CREATININE 1.20*  --  1.69*  TROPONINIHS  --   --  16    Estimated Creatinine Clearance: 52.5 mL/min (A) (by C-G formula based on SCr of 1.69 mg/dL (H)).   Medical History: Past Medical History:  Diagnosis Date   Abnormal uterine bleeding    Allergy    Anemia    Asthma    Cataract    BOTH EYES   Complete heart block ([place])    a. Medtronic PPM 10/2020.   Diabetes ([place])    type II    Diabetic neuropathy ([place])    Endometrial cancer ([place])    Family history of colon cancer    GERD (gastroesophageal reflux disease)    Hyperlipidemia    Hypertension    Morbid obesity ([place])    Osteoarthritis    right knee    Pancreatitis    Pneumonia     Assessment: 61 yo F on apixaban 5 mg po BID PTA for PAF. She was just discharged  from [facility] 1/30.  Pharmacy to dose heparin for bridge therapy while apixaban held for NPO/GOO.  Last dose apixaban 5 mg was 1/30 at 9 am (in hospital- pt reports she took no meds after DC home).  Baseline aPTT 24 Baseline heparin level drawn Baseline INR 1.6 (elevated d/t apixaban) SCr 1.69,  Hg 10.6 (was ~ 8), PLT 294. No bleeding reported  Goal of Therapy:  Heparin level 0.3-0.7 units/ml Heparin level 66-102 units/ml Monitor platelets by anticoagulation protocol: Yes   Plan:  Baseline heparin level has been drawn by RN Start heparin at 1450 units/hr Heparin level/aPTT 8 hrs after start Daily heparin level, aPTT and CBC while on heparin  Eudelia Bunch, Pharm.D 01/07/2022 9:50 AM

## 2022-01-07 NOTE — ED Provider Notes (Signed)
[facility] Emergency Department Provider Note MRN:  [id number]  Arrival date & time: 01/07/22     Chief Complaint   Nausea vomiting History of Present Illness   Leslie Mullen is a 61 y.o. year-old female with a history of stage IV pancreatic cancer presenting to the ED with chief complaint of nausea vomiting.  Nausea and vomiting that started yesterday morning and has persisted for almost 24 hours.  Associated with worsening of her chronic epigastric pain.  Denies fever.  Blood sugars have been elevated this morning.  Brought here by EMS.  Review of Systems  A thorough review of systems was obtained and all systems are negative except as noted in the HPI and PMH.   Patient's Health History    Past Medical History:  Diagnosis Date   Abnormal uterine bleeding    Allergy    Anemia    Asthma    Cataract    BOTH EYES   Complete heart block ([place])    a. [employer] PPM 10/2020.   Diabetes ([place])    type II    Diabetic neuropathy ([place])    Endometrial cancer ([place])    Family history of colon cancer    GERD (gastroesophageal reflux disease)    Hyperlipidemia    Hypertension    Morbid obesity ([place])    Osteoarthritis    right knee    Pancreatitis    Pneumonia     Past Surgical History:  Procedure Laterality Date   CERVICAL BIOPSY     CESAREAN SECTION     x3   COLONOSCOPY WITH PROPOFOL N/A 06/29/2017   Procedure: COLONOSCOPY WITH PROPOFOL;  Surgeon: Irene Shipper, MD;  Location: [facility];  Service: Endoscopy;  Laterality: N/A;   CYSTECTOMY Right 2010   Cyst removed from right groin.    DILATION AND CURETTAGE OF UTERUS N/A 07/29/2021   Procedure: DILATATION AND CURETTAGE OF UTERUS;  Surgeon: Everitt Amber, MD;  Location: [facility];  Service: Gynecology;  Laterality: N/A;   INTRAUTERINE DEVICE (IUD) INSERTION N/A 07/29/2021   Procedure: INTRAUTERINE DEVICE (IUD) INSERTION;  Surgeon: Everitt Amber, MD;  Location: [facility];  Service: Gynecology;  Laterality: N/A;   IUD FROM MAIN PHARMACY   IR IMAGING GUIDED PORT INSERTION  11/11/2021   IR REMOVAL TUN ACCESS W/ PORT W/O FL MOD SED  01/02/2022   PACEMAKER IMPLANT N/A 10/11/2020   Procedure: PACEMAKER IMPLANT;  Surgeon: Vickie Epley, MD;  Location: [facility];  Service: Cardiovascular;  Laterality: N/A;   TONSILLECTOMY      Family History  Adopted: Yes  Problem Relation Age of Onset   Diabetes Father    Heart disease Father    Cancer Mother        colon   Colon cancer Neg Hx    Esophageal cancer Neg Hx    Rectal cancer Neg Hx    Stomach cancer Neg Hx     Social History   Socioeconomic History   Marital status: Married    Spouse name: Not on file   Number of children: 2   Years of education: Not on file   Highest education level: Not on file  Occupational History   Occupation: Intake Coordinator  Tobacco Use   Smoking status: Former    Types: Cigarettes   Smokeless tobacco: Never   Tobacco comments:    Former smoker (09/18/2021)  Vaping Use   Vaping Use: Never used  Substance and Sexual Activity   Alcohol use:  No    Alcohol/week: 0.0 standard drinks   Drug use: No   Sexual activity: Not on file  Other Topics Concern   Not on file  Social History Narrative   Not on file   Social Determinants of Health   Financial Resource Strain: Not on file  Food Insecurity: Not on file  Transportation Needs: Not on file  Physical Activity: Not on file  Stress: Not on file  Social Connections: Not on file  Intimate Partner Violence: Not on file     Physical Exam   Vitals:   01/07/22 0502 01/07/22 0645  BP: (!) 155/103 (!) 153/91  Pulse: (!) 116 (!) 108  Resp: 20 16  Temp: 97.7 F (36.5 C)   SpO2: 99% 99%    CONSTITUTIONAL: Chronically ill-appearing, NAD NEURO/PSYCH:  Alert and oriented x 3, no focal deficits EYES:  eyes equal and reactive ENT/NECK:  no LAD, no JVD CARDIO: Tachycardic rate, well-perfused, normal S1 and S2 PULM:  CTAB no wheezing or rhonchi GI/GU:   non-distended, mild epigastric tenderness to palpation MSK/SPINE:  No gross deformities, no edema SKIN:  no rash, atraumatic   *Additional and/or pertinent findings included in MDM below  Diagnostic and Interventional Summary    EKG Interpretation  Date/Time:  Wednesday January 07 2022 05:05:53 EST Ventricular Rate:  118 PR Interval:  148 QRS Duration: 85 QT Interval:  337 QTC Calculation: 473 R Axis:   52 Text Interpretation: Sinus tachycardia Confirmed by Gerlene Fee [phone]) on 01/07/2022 6:01:12 AM       Labs Reviewed  CBC - Abnormal; Notable for the following components:      Result Value   WBC 15.3 (*)    RBC 3.67 (*)    Hemoglobin 10.6 (*)    HCT 33.1 (*)    RDW 19.9 (*)    nRBC 0.8 (*)    All other components within normal limits  LACTIC ACID, PLASMA - Abnormal; Notable for the following components:   Lactic Acid, Venous 2.3 (*)    All other components within normal limits  COMPREHENSIVE METABOLIC PANEL - Abnormal; Notable for the following components:   Chloride 93 (*)    Glucose, Bld 376 (*)    BUN 24 (*)    Creatinine, Ser 1.69 (*)    Total Protein 8.2 (*)    Albumin 2.3 (*)    GFR, Estimated 35 (*)    Anion gap >20 (*)    All other components within normal limits  PROTIME-INR - Abnormal; Notable for the following components:   Prothrombin Time 18.7 (*)    INR 1.6 (*)    All other components within normal limits  CBG MONITORING, ED - Abnormal; Notable for the following components:   Glucose-Capillary 334 (*)    All other components within normal limits  CULTURE, BLOOD (ROUTINE X 2)  CULTURE, BLOOD (ROUTINE X 2)  URINE CULTURE  RESP PANEL BY RT-PCR (FLU A&B, COVID) ARPGX2  APTT  LIPASE, BLOOD  URINALYSIS, ROUTINE W REFLEX MICROSCOPIC  LACTIC ACID, PLASMA  TROPONIN I (HIGH SENSITIVITY)  TROPONIN I (HIGH SENSITIVITY)    CT ABDOMEN PELVIS WO CONTRAST  Final Result    DG Chest Port 1 View  Final Result      Medications  lactated ringers  bolus 1,000 mL (has no administration in time range)  ceFEPIme (MAXIPIME) 2 g in sodium chloride 0.9 % 100 mL IVPB (has no administration in time range)  metroNIDAZOLE (FLAGYL) IVPB 500 mg (500 mg Intravenous  New Bag/Given 01/07/22 0700)  sodium chloride 0.9 % bolus 1,000 mL (has no administration in time range)  ondansetron (ZOFRAN) injection 4 mg (4 mg Intravenous Given 01/07/22 0701)  fentaNYL (SUBLIMAZE) injection 50 mcg (50 mcg Intravenous Given 01/07/22 0700)     Procedures  /  Critical Care .Critical Care Performed by: Maudie Flakes, MD Authorized by: Maudie Flakes, MD   Critical care provider statement:    Critical care time (minutes):  35   Critical care was necessary to treat or prevent imminent or life-threatening deterioration of the following conditions: Gastric outlet obstruction.   Critical care was time spent personally by me on the following activities:  Development of treatment plan with patient or surrogate, discussions with consultants, evaluation of patient's response to treatment, examination of patient, ordering and review of laboratory studies, ordering and review of radiographic studies, ordering and performing treatments and interventions, pulse oximetry, re-evaluation of patient's condition and review of old charts Ultrasound ED Peripheral IV (Provider)  Date/Time: 01/07/2022 7:02 AM Performed by: Maudie Flakes, MD Authorized by: Maudie Flakes, MD   Procedure details:    Indications: multiple failed IV attempts     Skin Prep: chlorhexidine gluconate     Location:  Left AC   Angiocath:  18 G   Bedside Ultrasound Guided: Yes     Patient tolerated procedure without complications: Yes     Dressing applied: Yes    ED Course and Medical Decision Making  Initial Impression and Ddx Concern for SBO, sepsis, complication of pancreatic cancer.  Recent admission for Port-A-Cath infection.  Tachycardic but afebrile with reassuring blood pressure at this time, awaiting  labs, providing symptomatic management.  Past medical/surgical history that increases complexity of ED encounter: Advanced pancreatic cancer  Interpretation of Diagnostics I personally reviewed the EKG and my interpretation is as follows: Sinus tachycardia  CT revealing markedly enlarged stomach with decompressed bowel distally, concerning for gastric outlet obstruction.  Also possible necrosis of pancreas with superinfection.  Will admit to medicine, will also consult general surgery.  Placing NG tube at this time.  Patient Reassessment and Ultimate Disposition/Management Admission to hospitalist.  Patient management required discussion with the following services or consulting groups:  Hospitalist Service and General/Trauma Surgery  Complexity of Problems Addressed Acute illness or injury that poses threat of life of bodily function  Additional Data Reviewed and Analyzed Further history obtained from: EMS on arrival and Further history from spouse/family member  Factors Impacting ED Encounter Risk Use of parenteral controlled substances and Consideration of hospitalization  Barth Kirks. [place] Small, [place] [email or web address]  Final Clinical Impressions(s) / ED Diagnoses     ICD-10-CM   1. Gastric outlet obstruction  K31.1       ED Discharge Orders     None        Discharge Instructions Discussed with and Provided to Patient:   Discharge Instructions   None      Maudie Flakes, MD 01/07/22 [id number]

## 2022-01-07 NOTE — Consult Note (Addendum)
Referring Provider: [facility] Primary Care Physician:  Elinor Parkinson Primary Gastroenterologist:  Althia Forts  Reason for Consultation: Gastric outlet obstruction  HPI: Leslie Mullen is a 61 y.o. female medical history significant of CHB s/p PPM, DM2, HTN, afib anticoagulated on Eliquis (last dose 1/30 AM), metastatic pancreatic cancer, endometrial cancer, morbid obesity presents for evaluation of gastric outlet obstruction.  Patient recently hospitalized 1/26 to 1/30 for sepsis secondary to port infection.  She was then sent home on 5 days of oral doxycycline.  She then began to have nausea and vomiting and was unable to keep anything down.  Tried Compazine and antacids with no relief.  Patient states she has not had a bowel movement since Monday.  Bowel movement on Monday was "very small piece."  States she also has not been able to pass gas as well.  She states she was having abdominal pain.  Since receiving NG tube, she states she feels much better.  Patient had several episodes of vomiting yesterday, vomit was coffee-ground emesis.  Patient also reports ongoing melena for more than 6 months. States it is not sticky, normal soft and formed. Denies pepto-bismol or NSAID use. States prior to 6 months ago she was taking 2 tablets of ibuprofen up to 4 times daily for arthritic pain.  Has since ceased NSAID use.  Denies tobacco/alcohol use.  She had EGD/colon for further evaluation with [facility] for this.  EGD 08/2021: 10 mm hyperplastic nodule in the cardia.  Removed.  Small amount of retained food in the stomach body.  Significant edema in the pyloric channel and duodenal bulb.  Inflammatory findings identified, but unable to identify definitive ulcer.  Suspected buried ulcer.  Biopsies negative for H. pylori.  Biopsy showed minimal chronic inflammation.  Colonoscopy 08/2021.  2 tubular adenomas measuring up to 8 mm in ascending colon, 2 tubular adenomas measuring up to 8 mm in transverse colon.   Diverticula in sigmoid.  Colonoscopy 06/2017 by Dr. Henrene Pastor done for positive Cologuard: Four tubular adenomas were found in the transverse colon (4 mm), ascending (10 mm, 15 mm) colon and cecum (5 mm), diverticulosis in the left colon.  Repeat 3 years.   Past Medical History:  Diagnosis Date   Abnormal uterine bleeding    Allergy    Anemia    Asthma    Cataract    BOTH EYES   Complete heart block ([place])    a. Medtr[employer] PPM 10/2020.   Diabetes ([place])    type II    Diabetic neuropathy ([place])    Endometrial cancer ([place])    Family history of colon cancer    GERD (gastroesophageal reflux disease)    Hyperlipidemia    Hypertension    Morbid obesity ([place])    Osteoarthritis    right knee    Pancreatitis    Pneumonia     Past Surgical History:  Procedure Laterality Date   CERVICAL BIOPSY     CESAREAN SECTION     x3   COLONOSCOPY WITH PROPOFOL N/A 06/29/2017   Procedure: COLONOSCOPY WITH PROPOFOL;  Surgeon: Irene Shipper, MD;  Location: [facility];  Service: Endoscopy;  Laterality: N/A;   CYSTECTOMY Right 2010   Cyst removed from right groin.    DILATION AND CURETTAGE OF UTERUS N/A 07/29/2021   Procedure: DILATATION AND CURETTAGE OF UTERUS;  Surgeon: Everitt Amber, MD;  Location: [facility];  Service: Gynecology;  Laterality: N/A;   INTRAUTERINE DEVICE (IUD) INSERTION N/A 07/29/2021   Procedure: INTRAUTERINE DEVICE (IUD)  INSERTION;  Surgeon: Everitt Amber, MD;  Location: [facility];  Service: Gynecology;  Laterality: N/A;  IUD FROM MAIN PHARMACY   IR IMAGING GUIDED PORT INSERTION  11/11/2021   IR REMOVAL TUN ACCESS W/ PORT W/O FL MOD SED  01/02/2022   PACEMAKER IMPLANT N/A 10/11/2020   Procedure: PACEMAKER IMPLANT;  Surgeon: Vickie Epley, MD;  Location: [facility];  Service: Cardiovascular;  Laterality: N/A;   TONSILLECTOMY      Prior to Admission medications   Medication Sig Start Date End Date Taking? Authorizing Provider  acetaminophen (TYLENOL) 500 MG tablet Take 1,000 mg by  mouth every 6 (six) hours as needed for moderate pain.    [provider]  amLODipine (NORVASC) 10 MG tablet Take 1 tablet (10 mg total) by mouth at bedtime. 01/03/21   [name], MD  apixaban (ELIQUIS) 5 MG TABS tablet Take 1 tablet (5 mg total) by mouth 2 (two) times daily. 12/23/21   Little Ishikawa, MD  bisacodyl (DULCOLAX) 5 MG EC tablet Take 1 tablet (5 mg total) by mouth daily as needed for severe constipation. 01/05/22   Aline August, MD  doxycycline (ADOXA) 100 MG tablet Take 1 tablet (100 mg total) by mouth 2 (two) times daily for 5 days. 01/05/22 01/10/22  Aline August, MD  gabapentin (NEURONTIN) 300 MG capsule Take 1 capsule (300 mg total) by mouth 2 (two) times daily. 10/12/20   Evans Lance, MD  lidocaine-prilocaine (EMLA) cream Apply to affected area once Patient taking differently: Apply 1 application topically daily as needed (port access). 10/14/21   Truitt Merle, MD  loratadine (CLARITIN) 10 MG tablet Take 10 mg by mouth daily as needed for allergies.    [provider]  LORazepam (ATIVAN) 0.5 MG tablet Take 1 tablet (0.5 mg total) by mouth 2 (two) times daily as needed (for anxiety, and/or chemo-related nausea/vomiting). 11/13/21   Alla Feeling, NP  magic mouthwash w/lidocaine SOLN Take 5 mLs by mouth 4 (four) times daily as needed for mouth pain. 01/05/22   Aline August, MD  Menthol, Topical Analgesic, (BIOFREEZE ROLL-ON EX) Apply 1 application topically as needed (pain).    [provider]  mirtazapine (REMERON) 15 MG tablet Take 1 tablet (15 mg total) by mouth at bedtime. 12/11/21   Truitt Merle, MD  Multiple Vitamin (MULTIVITAMIN) capsule Take 1 capsule by mouth daily.    [provider]  omeprazole (PRILOSEC) 20 MG capsule Take 20 mg by mouth daily. 12/18/21   [provider]  ondansetron (ZOFRAN) 8 MG tablet TAKE 1 TABLET(8 MG) BY MOUTH TWICE DAILY AS NEEDED FOR NAUSEA OR VOMITING 01/05/22   Truitt Merle, MD  Oxycodone HCl 10 MG  TABS Take 1 tablet (10 mg total) by mouth every 6 (six) hours as needed. Patient taking differently: Take 10 mg by mouth every 6 (six) hours as needed (pain). 12/11/21   Truitt Merle, MD  polyethylene glycol (MIRALAX / GLYCOLAX) 17 g packet Take 17 g by mouth daily as needed for moderate constipation.    [provider]  pravastatin (PRAVACHOL) 40 MG tablet TAKE 1 TABLET(40 MG) BY MOUTH DAILY Patient taking differently: Take 40 mg by mouth daily. 07/23/20   [name], MD  prochlorperazine (COMPAZINE) 10 MG tablet TAKE 1 TABLET(10 MG) BY MOUTH EVERY 6 HOURS AS NEEDED FOR NAUSEA OR VOMITING Patient taking differently: Take 10 mg by mouth every 6 (six) hours as needed for nausea or vomiting. 12/16/21   Kalman Shan,  Wilhemina Cash, NP  senna-docusate (SENOKOT-S) 8.6-50 MG tablet Take 1 tablet by mouth 2 (two) times daily. 01/05/22   Aline August, MD  SYNJARDY XR 12.04-999 MG TB24 Take 1 tablet by mouth 2 (two) times daily. 05/30/21   [provider]  trolamine salicylate (ASPERCREME) 10 % cream Apply 1 application topically 2 (two) times daily as needed for muscle pain.    [provider]    Scheduled Meds:  insulin aspart  0-20 Units Subcutaneous TID WC   insulin aspart  0-5 Units Subcutaneous QHS   Continuous Infusions:  sodium chloride     ceFEPime (MAXIPIME) IV     heparin 1,450 Units/hr (01/07/22 1111)   metronidazole     promethazine (PHENERGAN) injection (IM or IVPB)     PRN Meds:.metoprolol tartrate, prochlorperazine, promethazine (PHENERGAN) injection (IM or IVPB)  Allergies as of 01/07/2022 - Review Complete 01/07/2022  Allergen Reaction Noted   Penicillins Shortness Of Breath, Itching, and Swelling 04/06/2016    Family History  Adopted: Yes  Problem Relation Age of Onset   Diabetes Father    Heart disease Father    Cancer Mother        colon   Colon cancer Neg Hx    Esophageal cancer Neg Hx    Rectal cancer Neg Hx    Stomach cancer Neg Hx     Social  History   Socioeconomic History   Marital status: Married    Spouse name: Not on file   Number of children: 2   Years of education: Not on file   Highest education level: Not on file  Occupational History   Occupation: Intake Coordinator  Tobacco Use   Smoking status: Former    Types: Cigarettes   Smokeless tobacco: Never   Tobacco comments:    Former smoker (09/18/2021)  Vaping Use   Vaping Use: Never used  Substance and Sexual Activity   Alcohol use: No    Alcohol/week: 0.0 standard drinks   Drug use: No   Sexual activity: Not on file  Other Topics Concern   Not on file  Social History Narrative   Not on file   Social Determinants of Health   Financial Resource Strain: Not on file  Food Insecurity: Not on file  Transportation Needs: Not on file  Physical Activity: Not on file  Stress: Not on file  Social Connections: Not on file  Intimate Partner Violence: Not on file    Review of Systems: Review of Systems  Constitutional:  Negative for chills and fever.  HENT:  Negative for hearing loss and tinnitus.   Eyes:  Negative for blurred vision and double vision.  Respiratory:  Negative for cough and hemoptysis.   Cardiovascular:  Negative for chest pain and palpitations.  Gastrointestinal:  Positive for abdominal pain, constipation, melena, nausea and vomiting. Negative for blood in stool, diarrhea and heartburn.  Genitourinary:  Negative for flank pain and hematuria.  Musculoskeletal:  Negative for myalgias and neck pain.  Skin:  Negative for itching and rash.  Neurological:  Negative for seizures and loss of consciousness.  Psychiatric/Behavioral:  Negative for substance abuse. The patient is not nervous/anxious.     Physical Exam:Physical Exam Constitutional:      Appearance: Normal appearance.  HENT:     Head: Normocephalic and atraumatic.     Nose: Nose normal. No congestion.     Mouth/Throat:     Mouth: Mucous membranes are moist.     Pharynx: Oropharynx  is clear.  Eyes:     Extraocular Movements: Extraocular movements intact.     Comments: Conjunctival pallor  Cardiovascular:     Rate and Rhythm: Normal rate and regular rhythm.  Pulmonary:     Effort: Pulmonary effort is normal. No respiratory distress.  Abdominal:     General: Abdomen is flat. Bowel sounds are normal. There is no distension.     Palpations: Abdomen is soft. There is no mass.     Tenderness: There is no abdominal tenderness. There is no guarding or rebound.     Hernia: No hernia is present.  Musculoskeletal:        General: No swelling. Normal range of motion.     Cervical back: Normal range of motion and neck supple.  Skin:    General: Skin is warm and dry.  Neurological:     General: No focal deficit present.     Mental Status: She is alert and oriented to person, place, and time.  Psychiatric:        Mood and Affect: Mood normal.        Behavior: Behavior normal.        Thought Content: Thought content normal.        Judgment: Judgment normal.    Vital signs: Vitals:   01/07/22 1100 01/07/22 1130  BP: (!) 141/76 (!) 151/83  Pulse: (!) 105 99  Resp: 14 14  Temp:    SpO2: 100% 99%        GI:  Lab Results: Recent Labs    01/05/22 0104 01/07/22 0556  WBC 6.7 15.3*  HGB 8.0* 10.6*  HCT 25.3* 33.1*  PLT 184 294   BMET Recent Labs    01/05/22 0104 01/07/22 0558  NA 139 141  K 3.8 3.8  CL 106 93*  CO2 24 25  GLUCOSE 175* 376*  BUN 11 24*  CREATININE 1.20* 1.69*  CALCIUM 7.7* 8.9   LFT Recent Labs    01/07/22 0558  PROT 8.2*  ALBUMIN 2.3*  AST 17  ALT 13  ALKPHOS 86  BILITOT 0.8   PT/INR Recent Labs    01/07/22 0558  LABPROT 18.7*  INR 1.6*     Studies/Results: CT ABDOMEN PELVIS WO CONTRAST  Result Date: 01/07/2022 CLINICAL DATA:  Metastatic pancreatic cancer. Hyperglycemia. Possible small bowel obstruction. EXAM: CT ABDOMEN AND PELVIS WITHOUT CONTRAST TECHNIQUE: Multidetector CT imaging of the abdomen and pelvis was  performed following the standard protocol without IV contrast. RADIATION DOSE REDUCTION: This exam was performed according to the departmental dose-optimization program which includes automated exposure control, adjustment of the mA and/or kV according to patient size and/or use of iterative reconstruction technique. COMPARISON:  10/25/2021 FINDINGS: Lower chest: Ground-glass opacity seen in the posterior right lower lobe previously has become more confluent and organized in the interval with a masslike character today measuring 2.2 x 1.5 cm. There is patchy airspace disease in the lingula and left lower lobe, new in the interval. Hepatobiliary: No focal abnormality in the liver on this study without intravenous contrast. The liver shows diffusely decreased attenuation suggesting fat deposition. Increased attenuation of contents in the gallbladder lumen may be related to sludge. No intrahepatic or extrahepatic biliary dilation. Pancreas: Interval decrease in size of the pancreatic head mass. Lesion is poorly defined on today's noncontrast study but measures approximately 4.1 x 4.0 cm compared to 6.0 x 5.3 cm previously. There is some trace gas in the region of the mass lesion today which could represent necrosis although superinfection  cannot be excluded. Spleen: No splenomegaly. No focal mass lesion. Adrenals/Urinary Tract: No adrenal nodule or mass. Kidneys unremarkable. No evidence for hydroureter. Bladder is decompressed. Stomach/Bowel: Stomach is markedly distended with fluid. Duodenum is decompressed and small bowel loops are diffusely decompressed throughout. The terminal ileum is normal. The appendix is normal. Colon is decompressed with mild diverticular disease on the left. Vascular/Lymphatic: There is mild atherosclerotic calcification of the abdominal aorta without aneurysm. There is no gastrohepatic or hepatoduodenal ligament lymphadenopathy. No retroperitoneal or mesenteric lymphadenopathy. No pelvic  sidewall lymphadenopathy. Reproductive: IUD is visualized in the uterus. There is no adnexal mass. Other: No intraperitoneal free fluid. Musculoskeletal: Small umbilical hernia contains only fat. No worrisome lytic or sclerotic osseous abnormality. IMPRESSION: 1. Interval decrease in size of the pancreatic head mass, poorly demonstrated on today's noncontrast CT. Trace gas in the region of the mass lesion today could represent necrosis although superinfection cannot be excluded. 2. Marked distention of the stomach with fluid. Duodenum is decompressed and small bowel loops are diffusely decompressed throughout. Imaging features are compatible with gastric outlet obstruction, likely secondary to involvement by the pancreatic head lesion. 3. Interval development of patchy airspace disease in the lingula and left lower lobe. Imaging features are compatible with infection/inflammation. 4. Ground-glass opacity in the posterior right lower lobe has become more confluent and organized in the interval with a masslike character today measuring 2.2 x 1.5 cm. While likely infectious/inflammatory, close follow-up recommended. 5. Hepatic steatosis. 6. Small umbilical hernia contains only fat. 7. Aortic Atherosclerosis (ICD10-I70.0). Electronically Signed   By: Misty Stanley M.D.   On: 01/07/2022 06:55   DG Chest Port 1 View  Result Date: 01/07/2022 CLINICAL DATA:  Possible sepsis. EXAM: PORTABLE CHEST 1 VIEW COMPARISON:  01/01/2022 FINDINGS: 0605 hours low lung volumes. The cardio pericardial silhouette is enlarged. There is pulmonary vascular congestion without overt pulmonary edema. No evidence for pleural effusion. Prominent gastric bubble noted under the left hemidiaphragm. Left permanent pacemaker noted. Telemetry leads overlie the chest. IMPRESSION: Low volume chest x-ray with pulmonary vascular congestion. Electronically Signed   By: Misty Stanley M.D.   On: 01/07/2022 06:33    Impression: Gastric outlet obstruction;  likely secondary to pancreatic head lesion -Hgb 10.6 -Leukocytosis with WBC 15.3 -Lipase normal -Potassium 3.8 -CT abdomen pelvis without contrast 2/1: Decrease in size in pancreatic head mass.  Marked distention of stomach with fluid, duodenum is decompressed and small bowel loops are diffusely decompressed.  Fatty liver -EGD 08/2021: Inflammation in the pylorus and duodenum making it difficult for scope to pass, suspected possible.  Also there.  Gastric polyp.  Negative for H. pylori.  A fib - on Eliquis (last dose Monday 1/30)  Metastatic pancreatic cancer -Has received chemotherapy (last infusion 12/29/2021)  Plan: Continue to maintain magnesium above 2 and potassium at 4-4.5.  Champ NG tube in place. Dulcolax suppository  Continue to monitor NG tube output  Plan for EGD with possible stent placement tomorrow. I thoroughly discussed the procedures to include nature, alternatives, benefits, and risks including but not limited to bleeding, perforation, infection, anesthesia/cardiac and pulmonary complications. Patient provides understanding and gave verbal consent to proceed.  Protonix 40mg  BID NPO at midnight Continue to hold Eliquis Continue supportive care  [facility] will follow   LOS: 0 days   Garnette Scheuermann  PA-C 01/07/2022, 12:26 PM  Contact #  [phone]

## 2022-01-07 NOTE — Sepsis Progress Note (Signed)
Notified bedside nurse of need to draw repeat lactic acid 3rd prior to end of sepsis protocol @ 1242.

## 2022-01-07 NOTE — Sepsis Progress Note (Signed)
Notified bedside nurse of need to order repeat lactic acid 3rd. Attempted to message MD via secure chat, but was unavailable. Asked bedside RN to please page and notify. Will continue to follow.

## 2022-01-07 NOTE — Consult Note (Addendum)
Leslie Mullen 1961-08-12  [id number].    Requesting MD: Dr. Cherylann Ratel Chief Complaint/Reason for Consult: Gastric Outlet Obstruction  HPI: Leslie Mullen is a 61 y.o. female with a hx Metastatic Pancreatic Cancer followed by Dr. Burr Medico on palliative chemo, CHB s/p pacemaker, DM2, GERD, HTN, HLD and A. Fib on Eliquis who presented to the emergency department for abdominal pain, nausea and vomiting.  Patient was recently admitted from 1/26 -1/30 for sepsis secondary to port infection.  Port was removed on 1/27. She reports at baseline she has been having nausea and upper abdominal pain since her initial diagnosis.  She reports since discharge on 1/30 her upper abdominal pain and nausea has become more severe with associated distention.  She reports having multiple episodes of emesis yesterday without tolerance of p.o. No flatus or bm since Monday. BM on Monday was small and hard. She presented to the ED for evaluation. During last admission, Dr. Burr Medico note stated patient was a poor candidate for chemo due to poor tolerance and low PS with plan to follow up on 2/3 in the office.   Patient was found to be afebrile.  She has been tachycardic since presentation with heart rate between 102-116. No hypotension. WBC 15.3. Lactic 2.3 > 2.5. Cr 1.69 (baseline 1.2). Bcx obtained and pending. CXR w/ pulm vascular congestion. CT A/P showed pancreatic head mass that was poorly demonstrated on CT today with gas in the region of the mass that could represent necrosis versus infection; gastric outlet obstruction likely secondary to pancreatic head lesion and groundglass opacity in the posterior right lower lobe. We were asked to see. TRH has admitted. Dr. Feng/Oncology has been consulted. Palliative appears to also have been consulted as well. Since NGT placement, patient has had > 2L NGT output.   In chart review, patient noted to have recent hospitalization from 12/16/2021-12/20/2021 for symptomatic anemia in the setting  of chemotherapy/metastatic pancreatic cancer requiring 4 units of packed red cell transfusion.   Hx C-Section x 3. Lives at home with her husband and son. No tobacco use.   ROS: Review of Systems  Constitutional:  Negative for chills and fever.  Gastrointestinal:  Positive for abdominal pain, constipation, nausea and vomiting.  All other systems reviewed and are negative.  Family History  Adopted: Yes  Problem Relation Age of Onset   Diabetes Father    Heart disease Father    Cancer Mother        colon   Colon cancer Neg Hx    Esophageal cancer Neg Hx    Rectal cancer Neg Hx    Stomach cancer Neg Hx     Past Medical History:  Diagnosis Date   Abnormal uterine bleeding    Allergy    Anemia    Asthma    Cataract    BOTH EYES   Complete heart block ([place])    a. [employer] PPM 10/2020.   Diabetes ([place])    type II    Diabetic neuropathy ([place])    Endometrial cancer ([place])    Family history of colon cancer    GERD (gastroesophageal reflux disease)    Hyperlipidemia    Hypertension    Morbid obesity ([place])    Osteoarthritis    right knee    Pancreatitis    Pneumonia     Past Surgical History:  Procedure Laterality Date   CERVICAL BIOPSY     CESAREAN SECTION     x3   COLONOSCOPY WITH PROPOFOL N/A  06/29/2017   Procedure: COLONOSCOPY WITH PROPOFOL;  Surgeon: Irene Shipper, MD;  Location: [facility];  Service: Endoscopy;  Laterality: N/A;   CYSTECTOMY Right 2010   Cyst removed from right groin.    DILATION AND CURETTAGE OF UTERUS N/A 07/29/2021   Procedure: DILATATION AND CURETTAGE OF UTERUS;  Surgeon: Everitt Amber, MD;  Location: [facility];  Service: Gynecology;  Laterality: N/A;   INTRAUTERINE DEVICE (IUD) INSERTION N/A 07/29/2021   Procedure: INTRAUTERINE DEVICE (IUD) INSERTION;  Surgeon: Everitt Amber, MD;  Location: [facility];  Service: Gynecology;  Laterality: N/A;  IUD FROM MAIN PHARMACY   IR IMAGING GUIDED PORT INSERTION  11/11/2021   IR REMOVAL TUN ACCESS W/ PORT W/O FL  MOD SED  01/02/2022   PACEMAKER IMPLANT N/A 10/11/2020   Procedure: PACEMAKER IMPLANT;  Surgeon: Vickie Epley, MD;  Location: [place] [facility];  Service: Cardiovascular;  Laterality: N/A;   TONSILLECTOMY      Social History:  reports that she has quit smoking. Her smoking use included cigarettes. She has never used smokeless tobacco. She reports that she does not drink alcohol and does not use drugs.  Allergies:  Allergies  Allergen Reactions   Penicillins Shortness Of Breath, Itching and Swelling    Has patient had a PCN reaction causing immediate rash, facial/tongue/throat swelling, SOB or lightheadedness with hypotension: Yes Has patient had a PCN reaction causing severe rash involving mucus membranes or skin necrosis:No Has patient had a PCN reaction that required hospitalization: No Has patient had a PCN reaction occurring within the last 10 years: no If all of the above answers are "NO", then may proceed with Cephalosporin use.  *Per patient, tolerated Keflex in the past      (Not in a hospital admission)    Physical Exam: Blood pressure (!) 151/90, pulse (!) 105, temperature 97.7 F (36.5 C), resp. rate (!) 29, height 5\' 8"  (1.727 m), weight 136.1 kg, SpO2 99 %. General: pleasant, WD/WN AA female who is laying in bed in NAD HEENT: head is normocephalic, atraumatic.  Sclera are noninjected.  PERRL.  Ears and nose without any masses or lesions.  Mouth is pink and moist. Dentition fair Heart: Tachycardic with regular rhythm.  No obvious murmurs. Palpable radial pulses bilaterally  Lungs: CTAB, no wheezes, rhonchi, or rales noted.  Respiratory effort nonlabored. Prior port site with dressing c/d/I. Abd: Obese, soft, upper abdominal tenderness without peritonitis, hypoactive BS. Prior C-section scar well healed. NGT in place w/ bilous output.  MS: no BUE/BLE edema, calves soft and nontender Skin: warm and dry with no masses, lesions, or rashes Psych: A&Ox4 with an  appropriate affect Neuro: cranial nerves grossly intact, normal speech, thought process intact, moves all extremities, gait not assessed   Results for orders placed or performed during the hospital encounter of 01/07/22 (from the past 48 hour(s))  CBG monitoring, ED     Status: Abnormal   Collection Time: 01/07/22  4:59 AM  Result Value Ref Range   Glucose-Capillary 334 (H) 70 - 99 mg/dL    Comment: Glucose reference range applies only to samples taken after fasting for at least 8 hours.  CBC     Status: Abnormal   Collection Time: 01/07/22  5:56 AM  Result Value Ref Range   WBC 15.3 (H) 4.0 - 10.5 K/uL   RBC 3.67 (L) 3.87 - 5.11 MIL/uL   Hemoglobin 10.6 (L) 12.0 - 15.0 g/dL   HCT 33.1 (L) 36.0 - 46.0 %   MCV  90.2 80.0 - 100.0 fL   MCH 28.9 26.0 - 34.0 pg   MCHC 32.0 30.0 - 36.0 g/dL   RDW 19.9 (H) 11.5 - 15.5 %   Platelets 294 150 - 400 K/uL   nRBC 0.8 (H) 0.0 - 0.2 %    Comment: Performed at [facility], [place] [address], [place], [place] [address]  Lactic acid, plasma     Status: Abnormal   Collection Time: 01/07/22  5:56 AM  Result Value Ref Range   Lactic Acid, Venous 2.3 (HH) 0.5 - 1.9 mmol/L    Comment: CRITICAL RESULT CALLED TO, READ BACK BY AND VERIFIED WITHMerita Norton RN AT [phone] 01/07/22 Leslie Mullen,Leslie Mullen Performed at [facility], [place] [address] [place], [place] [address]   Comprehensive metabolic panel     Status: Abnormal   Collection Time: 01/07/22  5:58 AM  Result Value Ref Range   Sodium 141 135 - 145 mmol/L    Comment: REPEATED TO VERIFY   Potassium 3.8 3.5 - 5.1 mmol/L   Chloride 93 (L) 98 - 111 mmol/L    Comment: REPEATED TO VERIFY   CO2 25 22 - 32 mmol/L    Comment: REPEATED TO VERIFY   Glucose, Bld 376 (H) 70 - 99 mg/dL    Comment: Glucose reference range applies only to samples taken after fasting for at least 8 hours.   BUN 24 (H) 6 - 20 mg/dL   Creatinine, Ser 1.69 (H) 0.44 - 1.00 mg/dL   Calcium 8.9 8.9 - 10.3 mg/dL    Total Protein 8.2 (H) 6.5 - 8.1 g/dL   Albumin 2.3 (L) 3.5 - 5.0 g/dL   AST 17 15 - 41 U/L   ALT 13 0 - 44 U/L   Alkaline Phosphatase 86 38 - 126 U/L   Total Bilirubin 0.8 0.3 - 1.2 mg/dL   GFR, Estimated 35 (L) >60 mL/min    Comment: (NOTE) Calculated using the CKD-EPI Creatinine Equation (2021)    Anion gap >20 (H) 5 - 15    Comment: REPEATED TO VERIFY Performed at [place] [address] [place], [place] [address]   Protime-INR     Status: Abnormal   Collection Time: 01/07/22  5:58 AM  Result Value Ref Range   Prothrombin Time 18.7 (H) 11.4 - 15.2 seconds   INR 1.6 (H) 0.8 - 1.2    Comment: (NOTE) INR goal varies based on device and disease states. Performed at [facility], [place] [address], [place], [place] [address]   APTT     Status: None   Collection Time: 01/07/22  5:58 AM  Result Value Ref Range   aPTT 24 24 - 36 seconds    Comment: Performed at [facility], [place] [address], [place], [place] [address]  Troponin I (High Sensitivity)     Status: None   Collection Time: 01/07/22  5:58 AM  Result Value Ref Range   Troponin I (High Sensitivity) 16 <18 ng/L    Comment: (NOTE) Elevated high sensitivity troponin I (hsTnI) values and significant  changes across serial measurements may suggest ACS but many other  chronic and acute conditions are known to elevate hsTnI results.  Refer to the Links section for chest pain algorithms and additional  guidance. Performed at [facility], [place] [address] [place], [place] [address]   Lipase, blood     Status: None   Collection Time: 01/07/22  5:58 AM  Result  Value Ref Range   Lipase 43 11 - 51 U/L    Comment: Performed at [facility], [place] [address] [place], [place] [address]  Lactic acid, plasma     Status: Abnormal   Collection Time: 01/07/22  8:38 AM  Result Value Ref Range   Lactic Acid, Venous 2.5 (HH) 0.5 - 1.9  mmol/L    Comment: CRITICAL VALUE NOTED.  VALUE IS CONSISTENT WITH PREVIOUSLY REPORTED AND CALLED VALUE. Performed at [facility], [place] [address], [place], [place] [address]   Troponin I (High Sensitivity)     Status: None   Collection Time: 01/07/22  8:38 AM  Result Value Ref Range   Troponin I (High Sensitivity) 13 <18 ng/L    Comment: (NOTE) Elevated high sensitivity troponin I (hsTnI) values and significant  changes across serial measurements may suggest ACS but many other  chronic and acute conditions are known to elevate hsTnI results.  Refer to the "Links" section for chest pain algorithms and additional  guidance. Performed at [facility], [place] [address] [place], [place] [address]   Resp Panel by RT-PCR (Flu A&B, Covid) Nasopharyngeal Swab     Status: None   Collection Time: 01/07/22  8:38 AM   Specimen: Nasopharyngeal Swab; Nasopharyngeal(NP) swabs in vial transport medium  Result Value Ref Range   SARS Coronavirus 2 by RT PCR NEGATIVE NEGATIVE    Comment: (NOTE) SARS-CoV-2 target nucleic acids are NOT DETECTED.  The SARS-CoV-2 RNA is generally detectable in upper respiratory specimens during the acute phase of infection. The lowest concentration of SARS-CoV-2 viral copies this assay can detect is 138 copies/mL. A negative result does not preclude SARS-Cov-2 infection and should not be used as the sole basis for treatment or other patient management decisions. A negative result may occur with  improper specimen collection/handling, submission of specimen other than nasopharyngeal swab, presence of viral mutation(s) within the areas targeted by this assay, and inadequate number of viral copies(<138 copies/mL). A negative result must be combined with clinical observations, patient history, and epidemiological information. The expected result is Negative.  Fact Sheet for Patients:  EntrepreneurPulse.com.au  Fact  Sheet for Healthcare Providers:  IncredibleEmployment.be  This test is no Leslie Mullen yet approved or cleared by the [place] FDA and  has been authorized for detection and/or diagnosis of SARS-CoV-2 by FDA under an Emergency Use Authorization (EUA). This EUA will remain  in effect (meaning this test can be used) for the duration of the COVID-19 declaration under Section 564(b)(1) of the Act, 21 U.S.C.section 360bbb-3(b)(1), unless the authorization is terminated  or revoked sooner.       Influenza A by PCR NEGATIVE NEGATIVE   Influenza B by PCR NEGATIVE NEGATIVE    Comment: (NOTE) The Xpert Xpress SARS-CoV-2/FLU/RSV plus assay is intended as an aid in the diagnosis of influenza from Nasopharyngeal swab specimens and should not be used as a sole basis for treatment. Nasal washings and aspirates are unacceptable for Xpert Xpress SARS-CoV-2/FLU/RSV testing.  Fact Sheet for Patients: EntrepreneurPulse.com.au  Fact Sheet for Healthcare Providers: IncredibleEmployment.be  This test is not yet approved or cleared by the [place] FDA and has been authorized for detection and/or diagnosis of SARS-CoV-2 by FDA under an Emergency Use Authorization (EUA). This EUA will remain in effect (meaning this test can be used) for the duration of the COVID-19 declaration under Section 564(b)(1) of the Act, 21 U.S.C. section 360bbb-3(b)(1), unless the authorization is terminated or revoked.  Performed  at [facility], [place] [address], [place], [place] [address]    CT ABDOMEN PELVIS WO CONTRAST  Result Date: 01/07/2022 CLINICAL DATA:  Metastatic pancreatic cancer. Hyperglycemia. Possible small bowel obstruction. EXAM: CT ABDOMEN AND PELVIS WITHOUT CONTRAST TECHNIQUE: Multidetector CT imaging of the abdomen and pelvis was performed following the standard protocol without IV contrast. RADIATION DOSE REDUCTION: This exam was  performed according to the departmental dose-optimization program which includes automated exposure control, adjustment of the mA and/or kV according to patient size and/or use of iterative reconstruction technique. COMPARISON:  10/25/2021 FINDINGS: Lower chest: Ground-glass opacity seen in the posterior right lower lobe previously has become more confluent and organized in the interval with a masslike character today measuring 2.2 x 1.5 cm. There is patchy airspace disease in the lingula and left lower lobe, new in the interval. Hepatobiliary: No focal abnormality in the liver on this study without intravenous contrast. The liver shows diffusely decreased attenuation suggesting fat deposition. Increased attenuation of contents in the gallbladder lumen may be related to sludge. No intrahepatic or extrahepatic biliary dilation. Pancreas: Interval decrease in size of the pancreatic head mass. Lesion is poorly defined on today's noncontrast study but measures approximately 4.1 x 4.0 cm compared to 6.0 x 5.3 cm previously. There is some trace gas in the region of the mass lesion today which could represent necrosis although superinfection cannot be excluded. Spleen: No splenomegaly. No focal mass lesion. Adrenals/Urinary Tract: No adrenal nodule or mass. Kidneys unremarkable. No evidence for hydroureter. Bladder is decompressed. Stomach/Bowel: Stomach is markedly distended with fluid. Duodenum is decompressed and small bowel loops are diffusely decompressed throughout. The terminal ileum is normal. The appendix is normal. Colon is decompressed with mild diverticular disease on the left. Vascular/Lymphatic: There is mild atherosclerotic calcification of the abdominal aorta without aneurysm. There is no gastrohepatic or hepatoduodenal ligament lymphadenopathy. No retroperitoneal or mesenteric lymphadenopathy. No pelvic sidewall lymphadenopathy. Reproductive: IUD is visualized in the uterus. There is no adnexal mass.  Other: No intraperitoneal free fluid. Musculoskeletal: Small umbilical hernia contains only fat. No worrisome lytic or sclerotic osseous abnormality. IMPRESSION: 1. Interval decrease in size of the pancreatic head mass, poorly demonstrated on today's noncontrast CT. Trace gas in the region of the mass lesion today could represent necrosis although superinfection cannot be excluded. 2. Marked distention of the stomach with fluid. Duodenum is decompressed and small bowel loops are diffusely decompressed throughout. Imaging features are compatible with gastric outlet obstruction, likely secondary to involvement by the pancreatic head lesion. 3. Interval development of patchy airspace disease in the lingula and left lower lobe. Imaging features are compatible with infection/inflammation. 4. Ground-glass opacity in the posterior right lower lobe has become more confluent and organized in the interval with a masslike character today measuring 2.2 x 1.5 cm. While likely infectious/inflammatory, close follow-up recommended. 5. Hepatic steatosis. 6. Small umbilical hernia contains only fat. 7. Aortic Atherosclerosis (ICD10-I70.0). Electronically Signed   By: Misty Stanley M.D.   On: 01/07/2022 06:55   DG Chest Port 1 View  Result Date: 01/07/2022 CLINICAL DATA:  Possible sepsis. EXAM: PORTABLE CHEST 1 VIEW COMPARISON:  01/01/2022 FINDINGS: 0605 hours low lung volumes. The cardio pericardial silhouette is enlarged. There is pulmonary vascular congestion without overt pulmonary edema. No evidence for pleural effusion. Prominent gastric bubble noted under the left hemidiaphragm. Left permanent pacemaker noted. Telemetry leads overlie the chest. IMPRESSION: Low volume chest x-ray with pulmonary vascular congestion. Electronically Signed   By: Verda Cumins.D.  On: 01/07/2022 06:33    Anti-infectives (From admission, onward)    Start     Dose/Rate Route Frequency Ordered Stop   01/07/22 2000  ceFEPIme (MAXIPIME) 2 g in  sodium chloride 0.9 % 100 mL IVPB        2 g 200 mL/hr over 30 Minutes Intravenous Every 12 hours 01/07/22 0935     01/07/22 0645  ceFEPIme (MAXIPIME) 2 g in sodium chloride 0.9 % 100 mL IVPB        2 g 200 mL/hr over 30 Minutes Intravenous  Once 01/07/22 0641 01/07/22 0822   01/07/22 0645  metroNIDAZOLE (FLAGYL) IVPB 500 mg        500 mg 100 mL/hr over 60 Minutes Intravenous  Once 01/07/22 2620 01/07/22 0757       Assessment/Plan Gastric Outlet Obstruction 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 female with a hx Metastatic Pancreatic Cancer followed by Dr. Burr Medico on palliative chemo who presented with n/v and was found to have gastric outlet obstruction likely secondary to pancreatic head lesion. During last admission, Dr. Burr Medico note stated patient was a poor candidate for chemo due to poor tolerance and low PS with plan to follow up on 2/3 in the office. Patient currently with NGT in place and > 2 L out since placement.  - Discussed with MD. We would like to discuss with Dr. Ernestina Penna team to get a better understanding of patient's outlook, disease progression, plan for chemo etc. before discussion in offering either venting g-tube vs palliative lap GJ bypass. Agree with palliative consult as well for [place] discussions. Further recommendations after discuss with their team and my attending. Cont NGT and keep NPO  FEN - NPO, NGT, IVF VTE - SCDs, hold eliquis, okay for heparin gtt ID - Cefepime/Flagyl  Possible PNA vs superimposed infection of pancreas - On abx.  AKI - Cr 1.69 (baseline 1.2) CHB s/p pacemaker DM2 GERD HTN HLD  A. Fib on Eliquis  This care required moderate level of medical decision making.   Leslie Mullen, [facility] Surgery 01/07/2022, 10:12 AM Please see Amion for pager number during day hours 7:00am-4:30pm

## 2022-01-07 NOTE — ED Triage Notes (Signed)
Patient is coming from home by [facility]. Patient states her blood sugar is 465 this morning and she is not on any diabetic medication per patient.

## 2022-01-08 ENCOUNTER — Inpatient Hospital Stay (HOSPITAL_COMMUNITY): Payer: 59 | Admitting: Anesthesiology

## 2022-01-08 ENCOUNTER — Inpatient Hospital Stay (HOSPITAL_COMMUNITY): Payer: 59

## 2022-01-08 ENCOUNTER — Encounter (HOSPITAL_COMMUNITY): Payer: Self-pay | Admitting: Internal Medicine

## 2022-01-08 ENCOUNTER — Encounter: Payer: Self-pay | Admitting: Hematology

## 2022-01-08 ENCOUNTER — Encounter (HOSPITAL_COMMUNITY)
Admission: EM | Disposition: A | Discharge status: Home Health | Payer: Self-pay | Source: Home / Self Care | Attending: Family Medicine

## 2022-01-08 DIAGNOSIS — Z515 Encounter for palliative care: Secondary | ICD-10-CM | POA: Diagnosis not present

## 2022-01-08 DIAGNOSIS — C259 Malignant neoplasm of pancreas, unspecified: Secondary | ICD-10-CM | POA: Diagnosis not present

## 2022-01-08 DIAGNOSIS — C787 Secondary malignant neoplasm of liver and intrahepatic bile duct: Secondary | ICD-10-CM

## 2022-01-08 DIAGNOSIS — Z7189 Other specified counseling: Secondary | ICD-10-CM

## 2022-01-08 DIAGNOSIS — K311 Adult hypertrophic pyloric stenosis: Secondary | ICD-10-CM | POA: Diagnosis not present

## 2022-01-08 HISTORY — PX: DUODENAL STENT PLACEMENT: SHX5541

## 2022-01-08 HISTORY — PX: ESOPHAGOGASTRODUODENOSCOPY: SHX5428

## 2022-01-08 LAB — COMPREHENSIVE METABOLIC PANEL
ALT: 10 U/L (ref 0–44)
AST: 14 U/L — ABNORMAL LOW (ref 15–41)
Albumin: 1.9 g/dL — ABNORMAL LOW (ref 3.5–5.0)
Alkaline Phosphatase: 57 U/L (ref 38–126)
Anion gap: 12 (ref 5–15)
BUN: 33 mg/dL — ABNORMAL HIGH (ref 6–20)
CO2: 35 mmol/L — ABNORMAL HIGH (ref 22–32)
Calcium: 7.7 mg/dL — ABNORMAL LOW (ref 8.9–10.3)
Chloride: 95 mmol/L — ABNORMAL LOW (ref 98–111)
Creatinine, Ser: 1.9 mg/dL — ABNORMAL HIGH (ref 0.44–1.00)
GFR, Estimated: 30 mL/min — ABNORMAL LOW (ref >60)
Glucose, Bld: 215 mg/dL — ABNORMAL HIGH (ref 70–99)
Potassium: 2.9 mmol/L — ABNORMAL LOW (ref 3.5–5.1)
Sodium: 142 mmol/L (ref 135–145)
Total Bilirubin: 0.9 mg/dL (ref 0.3–1.2)
Total Protein: 5.4 g/dL — ABNORMAL LOW (ref 6.5–8.1)

## 2022-01-08 LAB — CORTISOL-AM, BLOOD: Cortisol - AM: 25.8 ug/dL — ABNORMAL HIGH (ref 6.7–22.6)

## 2022-01-08 LAB — URINALYSIS, ROUTINE W REFLEX MICROSCOPIC
Glucose, UA: >=500 mg/dL — AB
Ketones, ur: 5 mg/dL — AB
Leukocytes,Ua: NEGATIVE
Nitrite: NEGATIVE
Protein, ur: 100 mg/dL — AB
Specific Gravity, Urine: 1.03 (ref 1.005–1.030)
pH: 6 (ref 5.0–8.0)

## 2022-01-08 LAB — CBC
HCT: 22.3 % — ABNORMAL LOW (ref 36.0–46.0)
Hemoglobin: 7.2 g/dL — ABNORMAL LOW (ref 12.0–15.0)
MCH: 29.5 pg (ref 26.0–34.0)
MCHC: 32.3 g/dL (ref 30.0–36.0)
MCV: 91.4 fL (ref 80.0–100.0)
Platelets: 204 10*3/uL (ref 150–400)
RBC: 2.44 MIL/uL — ABNORMAL LOW (ref 3.87–5.11)
RDW: 20 % — ABNORMAL HIGH (ref 11.5–15.5)
WBC: 15.2 10*3/uL — ABNORMAL HIGH (ref 4.0–10.5)
nRBC: 1.4 % — ABNORMAL HIGH (ref 0.0–0.2)

## 2022-01-08 LAB — GLUCOSE, CAPILLARY
Glucose-Capillary: 190 mg/dL — ABNORMAL HIGH (ref 70–99)
Glucose-Capillary: 221 mg/dL — ABNORMAL HIGH (ref 70–99)
Glucose-Capillary: 182 mg/dL — ABNORMAL HIGH (ref 70–99)
Glucose-Capillary: 168 mg/dL — ABNORMAL HIGH (ref 70–99)
Glucose-Capillary: 270 mg/dL — ABNORMAL HIGH (ref 70–99)

## 2022-01-08 LAB — PROCALCITONIN: Procalcitonin: 0.26 ng/mL

## 2022-01-08 LAB — HEPARIN LEVEL (UNFRACTIONATED): Heparin Unfractionated: >1.1 IU/mL — ABNORMAL HIGH (ref 0.30–0.70)

## 2022-01-08 LAB — PROTIME-INR
INR: 1.7 — ABNORMAL HIGH (ref 0.8–1.2)
Prothrombin Time: 19.8 seconds — ABNORMAL HIGH (ref 11.4–15.2)

## 2022-01-08 LAB — HEMOGLOBIN AND HEMATOCRIT, BLOOD
HCT: 22.7 % — ABNORMAL LOW (ref 36.0–46.0)
Hemoglobin: 7.4 g/dL — ABNORMAL LOW (ref 12.0–15.0)

## 2022-01-08 LAB — APTT: aPTT: 93 seconds — ABNORMAL HIGH (ref 24–36)

## 2022-01-08 SURGERY — EGD (ESOPHAGOGASTRODUODENOSCOPY)
Anesthesia: General

## 2022-01-08 MED ORDER — ONDANSETRON HCL 4 MG/2ML IJ SOLN
INTRAMUSCULAR | Status: DC | PRN
Start: 2022-01-08 — End: 2022-01-08
  Administered 2022-01-08: 4 mg via INTRAVENOUS

## 2022-01-08 MED ORDER — MORPHINE SULFATE (PF) 2 MG/ML IV SOLN
2.0000 mg | INTRAVENOUS | Status: DC | PRN
  Administered 2022-01-08 – 2022-01-09 (×10): 2 mg via INTRAVENOUS
  Filled 2022-01-08 (×10): qty 1

## 2022-01-08 MED ORDER — LIDOCAINE HCL (CARDIAC) PF 100 MG/5ML IV SOSY
PREFILLED_SYRINGE | INTRAVENOUS | Status: DC | PRN
  Administered 2022-01-08: 100 mg via INTRAVENOUS

## 2022-01-08 MED ORDER — PHENYLEPHRINE 40 MCG/ML (10ML) SYRINGE FOR IV PUSH (FOR BLOOD PRESSURE SUPPORT)
PREFILLED_SYRINGE | INTRAVENOUS | Status: DC | PRN
  Administered 2022-01-08 (×2): 120 ug via INTRAVENOUS
  Administered 2022-01-08: 80 ug via INTRAVENOUS
  Administered 2022-01-08: 120 ug via INTRAVENOUS

## 2022-01-08 MED ORDER — MORPHINE SULFATE (PF) 2 MG/ML IV SOLN
1.0000 mg | Freq: Once | INTRAVENOUS | Status: AC
  Administered 2022-01-08: 1 mg via INTRAVENOUS
  Filled 2022-01-08: qty 1

## 2022-01-08 MED ORDER — POTASSIUM CHLORIDE 10 MEQ/100ML IV SOLN
10.0000 meq | INTRAVENOUS | Status: AC
  Administered 2022-01-08 (×4): 10 meq via INTRAVENOUS
  Filled 2022-01-08 (×4): qty 100

## 2022-01-08 MED ORDER — LACTATED RINGERS IV SOLN: INTRAVENOUS | Status: DC | PRN

## 2022-01-08 MED ORDER — PROPOFOL 10 MG/ML IV BOLUS
INTRAVENOUS | Status: DC | PRN
  Administered 2022-01-08: 150 mg via INTRAVENOUS

## 2022-01-08 MED ORDER — OXYCODONE HCL 5 MG PO TABS: 5.0000 mg | ORAL_TABLET | Freq: Four times a day (QID) | ORAL | Status: DC | PRN

## 2022-01-08 MED ORDER — SUCCINYLCHOLINE CHLORIDE 200 MG/10ML IV SOSY
PREFILLED_SYRINGE | INTRAVENOUS | Status: DC | PRN
  Administered 2022-01-08: 140 mg via INTRAVENOUS

## 2022-01-08 MED ORDER — SODIUM CHLORIDE 0.9 % IV SOLN: INTRAVENOUS | Status: DC | PRN

## 2022-01-08 MED ORDER — POTASSIUM CHLORIDE 10 MEQ/100ML IV SOLN: 10.0000 meq | INTRAVENOUS | Status: DC

## 2022-01-08 MED ORDER — HEPARIN (PORCINE) 25000 UT/250ML-% IV SOLN
1550.0000 [IU]/h | INTRAVENOUS | Status: DC
  Administered 2022-01-08: 1550 [IU]/h via INTRAVENOUS
  Filled 2022-01-08 (×2): qty 250

## 2022-01-08 MED ORDER — SODIUM CHLORIDE 0.9 % IV SOLN: INTRAVENOUS | Status: DC

## 2022-01-08 NOTE — Progress Notes (Signed)
Derrek Gu 12:34 PM  Subjective: Patient seen and examined and discussed with surgical team and she feels much better with 5 to 6 L removed from her NG but still has a little pain and has no new complaints  Objective: Vital signs stable afebrile exam please see preassessment evaluation abdomen is benign increased BUN and creatinine slight decrease hemoglobin  Assessment: Metastatic pancreas cancer with gastric outlet obstruction  Plan: We rediscussed the procedure including the risk benefits and methods of PEG placement and will proceed today with anesthesia assistance with further work-up and plans pending those findings  [facility] E  office [phone] After 5PM or if no answer call [phone]

## 2022-01-08 NOTE — TOC Initial Note (Signed)
Transition of Care [facility]) - Initial/Assessment Note    Patient Details  Name: Leslie Mullen MRN: [id number] Date of Birth: [birth year]  Transition of Care [facility]) CM/SW Contact:    Dessa Phi, RN Phone Number: 01/08/2022, 12:14 PM  Clinical Narrative:  Noted GI/oncology/gen sx/palliative following.Monitor for d/c needs.                 Expected Discharge Plan: Home/Self Care Barriers to Discharge: Continued Medical Work up   Patient Goals and CMS Choice Patient states their goals for this hospitalization and ongoing recovery are:: home CMS Medicare.gov Compare Post Acute Care list provided to:: Patient Choice offered to / list presented to : Patient  Expected Discharge Plan and Services Expected Discharge Plan: Home/Self Care     Post Acute Care Choice: NA Living arrangements for the past 2 months: Single Family Home                                      Prior Living Arrangements/Services Living arrangements for the past 2 months: Single Family Home Lives with:: Spouse Patient language and need for interpreter reviewed:: Yes Do you feel safe going back to the place where you live?: Yes      Need for Family Participation in Patient Care: Yes (Comment) Care giver support system in place?: Yes (comment)   Criminal Activity/Legal Involvement Pertinent to Current Situation/Hospitalization: No - Comment as needed  Activities of Daily Living Home Assistive Devices/Equipment: Cane (specify quad or straight), Other (Comment), Hand-held shower hose ADL Screening (condition at time of admission) Patient's cognitive ability adequate to safely complete daily activities?: No Is the patient deaf or have difficulty hearing?: Yes Does the patient have difficulty seeing, even when wearing glasses/contacts?: Yes Does the patient have difficulty concentrating, remembering, or making decisions?: Yes Patient able to express need for assistance with ADLs?: No Does the patient have  difficulty dressing or bathing?: No Independently performs ADLs?: Yes (appropriate for developmental age) Does the patient have difficulty walking or climbing stairs?: Yes Weakness of Legs: Both Weakness of Arms/Hands: None  Permission Sought/Granted Permission sought to share information with : Case Manager Permission granted to share information with : Yes, Verbal Permission Granted  Share Information with NAME: Case manager           Emotional Assessment Appearance:: Appears stated age            Admission diagnosis:  Gastric outlet obstruction [K31.1] Patient Active Problem List   Diagnosis Date Noted   Gastric outlet obstruction 01/07/2022   Sepsis ([place]) 01/02/2022   Dizziness 01/01/2022   SIRS (systemic inflammatory response syndrome) ([place]) 01/01/2022   Lactic acidosis 01/01/2022   Acute GI bleeding 12/18/2021   Symptomatic anemia 12/16/2021   Stage 3a chronic kidney disease (CKD) ([place]) 12/16/2021   Obesity, Class III, BMI 40-49.9 (morbid obesity) ([place]) 12/16/2021   Hyperlipidemia 12/16/2021   Anxiety 12/16/2021   Chronic pain 12/16/2021   Complete heart block ([place]) 12/16/2021   Port-A-Cath in place 11/18/2021   Family history of colon cancer 10/20/2021   Pancreatic cancer metastasized to liver ([place]) 10/14/2021   Pancreatic mass    Pancreatitis 10/05/2021   Leukocytosis 10/05/2021   AKI (acute kidney injury) ([place]) 10/05/2021   Chronic anticoagulation 10/05/2021   Thrombocytosis 10/05/2021   Paroxysmal atrial fibrillation ([place]) 09/18/2021   Secondary hypercoagulable state ([place]) 09/18/2021   Endometrial cancer ([place]) 07/29/2021   Heart block  10/10/2020   Fall at home, initial encounter 10/10/2020   Type 2 diabetes mellitus with diabetic polyneuropathy, with long-term current use of insulin ([place]) 01/31/2020   Type 2 diabetes mellitus with hypoglycemia ([place]) 01/31/2020   Type 2 diabetes mellitus with stage 3a chronic kidney disease, with long-term current use of  insulin ([place]) 01/31/2020   Abnormal feces    Benign neoplasm of ascending colon    Benign neoplasm of cecum    Benign neoplasm of transverse colon    PCP:  Sue Lush, PA-C Pharmacy:   [facility] [place], [address] [place] [address] Phone: [phone] Fax: [id number] - [place], [place] [address] [place] [address] Phone: [phone] Fax: [phone]  [facility] [address] [place] [address] Phone: [phone] Fax: [phone]     Social Determinants of Health (SDOH) Interventions    Readmission Risk Interventions Readmission Risk Prevention Plan 01/08/2022  Transportation Screening Complete  Medication Review ([place]) Complete  PCP or Specialist appointment within 3-5 days of discharge Complete  HRI or [place] Complete  SW Recovery Care/Counseling Consult Complete  [place] Not Applicable  Some recent data might be hidden

## 2022-01-08 NOTE — Progress Notes (Signed)
ANTICOAGULATION CONSULT NOTE - Follow Up Consult  Pharmacy Consult for heparin Indication: atrial fibrillation- bridge therapy while PTA apixaban held  Allergies  Allergen Reactions   Penicillins Shortness Of Breath, Itching and Swelling    Has patient had a PCN reaction causing immediate rash, facial/tongue/throat swelling, SOB or lightheadedness with hypotension: Yes Has patient had a PCN reaction causing severe rash involving mucus membranes or skin necrosis:No Has patient had a PCN reaction that required hospitalization: No Has patient had a PCN reaction occurring within the last 10 years: no If all of the above answers are "NO", then may proceed with Cephalosporin use.  *Per patient, tolerated Keflex in the past      Patient Measurements: Height: 5\' 8"  (172.7 cm) Weight: 136 kg (299 lb 13.2 oz) IBW/kg (Calculated) : 63.9 Heparin Dosing Weight: 96.7 kg  Vital Signs: Temp: 98.4 F (36.9 C) (02/02 1430) Temp Source: Oral (02/02 1430) BP: 130/73 (02/02 1430) Pulse Rate: 80 (02/02 1430)  Labs: Recent Labs    01/07/22 0556 01/07/22 0558 01/07/22 0838 01/07/22 0941 01/07/22 1843 01/08/22 0457 01/08/22 0816  HGB 10.6*  --   --   --   --  7.2* 7.4*  HCT 33.1*  --   --   --   --  22.3* 22.7*  PLT 294  --   --   --   --  204  --   APTT  --  24  --   --  61* 93*  --   LABPROT  --  18.7*  --   --   --  19.8*  --   INR  --  1.6*  --   --   --  1.7*  --   HEPARINUNFRC  --   --   --  >1.10* >1.10* >1.10*  --   CREATININE  --  1.69*  --   --   --  1.90*  --   TROPONINIHS  --  16 13  --   --   --   --      Estimated Creatinine Clearance: 46.7 mL/min (A) (by C-G formula based on SCr of 1.9 mg/dL (H)).   Medical History: Past Medical History:  Diagnosis Date   Abnormal uterine bleeding    Allergy    Anemia    Asthma    Cataract    BOTH EYES   Complete heart block ([place])    a. Medtronic PPM 10/2020.   Diabetes ([place])    type II    Diabetic neuropathy ([place])     Endometrial cancer ([place])    Family history of colon cancer    GERD (gastroesophageal reflux disease)    Hyperlipidemia    Hypertension    Morbid obesity ([place])    Osteoarthritis    right knee    Pancreatitis    Pneumonia     Assessment: 61 yo F on apixaban 5 mg po BID PTA for PAF. She was just discharged from [facility] 1/30.  Pharmacy to dose heparin for bridge therapy while apixaban held for NPO/GOO.  Last dose apixaban 5 mg was 1/30 at 9 am (in hospital- pt reports she took no meds after DC home).   Today, 01/08/2022 aPTT 93 seconds on heparin 1550 units/hr, therapeutic Heparin level > 1.1 due to effects of apixaban on board Heparin infusion held ~ 0800 this AM for EGD with stent today (procedure completed at 1355) Per discussion with GI and TRH, resume IV heparin with no bolus 4 hours  after procedure     Goal of Therapy:  Heparin level 0.3-0.7 units/ml aPTT 66-102 seconds Monitor platelets by anticoagulation protocol: Yes   Plan:  At 1800, resume heparin infusion at previously therapeutic rate of 1550 units/hr Using aPTT for heparin dose titration/monitoring until effects of Apixaban on heparin level diminished aPTT, heparin level 6 hours after resumption of heparin  Daily aPTT, heparin level, and CBC while on heparin Monitor for s/sx of bleeding   Lindell Spar, PharmD, BCPS Clinical Pharmacist  01/08/2022 4:15 PM

## 2022-01-08 NOTE — Transfer of Care (Signed)
Immediate Anesthesia Transfer of Care Note  Patient: Leslie Mullen  Procedure(s) Performed: ESOPHAGOGASTRODUODENOSCOPY (EGD) DUODENAL STENT PLACEMENT  Patient Location: Endoscopy Unit  Anesthesia Type:MAC  Level of Consciousness: awake and drowsy  Airway & Oxygen Therapy: Patient Spontanous Breathing and Patient connected to face mask oxygen  Post-op Assessment: Report given to RN and Post -op Vital signs reviewed and stable  Post vital signs: Reviewed and stable  Last Vitals:  Vitals Value Taken Time  BP 114/56 01/08/22 1352  Temp 37.1 C 01/08/22 1352  Pulse 84 01/08/22 1354  Resp 16 01/08/22 1354  SpO2 99 % 01/08/22 1354  Vitals shown include unvalidated device data.  Last Pain:  Vitals:   01/08/22 1352  TempSrc:   PainSc: Asleep      Patients Stated Pain Goal: 3 (81/82/99 3716)  Complications: No notable events documented.

## 2022-01-08 NOTE — Op Note (Signed)
[facility] Patient Name: Leslie Mullen Procedure Date: 01/08/2022 MRN: [id number] Attending MD: Clarene Essex , MD Date of Birth: [birth year] CSN: [id number] Age: 61 Admit Type: Inpatient Procedure:                Upper GI endoscopy Indications:              Abnormal CT of the GI tract, Nausea with vomiting                            gastric outlet obstruction from probable pancreatic                            cancer Providers:                Clarene Essex, MD, Burtis Junes, RN, Cletis [place],                            Technician, Stephanie [place] CRNA Referring MD:              Medicines:                General Anesthesia Complications:            No immediate complications. Estimated Blood Loss:     Estimated blood loss: none. Procedure:                Pre-Anesthesia Assessment:                           - Prior to the procedure, a History and Physical                            was performed, and patient medications and                            allergies were reviewed. The patient's tolerance of                            previous anesthesia was also reviewed. The risks                            and benefits of the procedure and the sedation                            options and risks were discussed with the patient.                            All questions were answered, and informed consent                            was obtained. Prior Anticoagulants: The patient has                            taken heparin, last dose was day of procedure. ASA  Grade Assessment: III - A patient with severe                            systemic disease. After reviewing the risks and                            benefits, the patient was deemed in satisfactory                            condition to undergo the procedure.                           After obtaining informed consent, the endoscope was                            passed under direct vision. Throughout  the                            procedure, the patient's blood pressure, pulse, and                            oxygen saturations were monitored continuously. The                            [id number]-[id number] ([id number]) Olympus endoscope was introduced                            through the mouth, and advanced to the second part                            of duodenum. Initially we advanced the wire into                            the duodenum and withdrew the scope under                            fluoroscopy making sure to keep the wire in the                            duodenum but in trying to advance the therapeutic                            endoscope the wire fell back into the stomach but                            we were able to advance the therapeutic endoscope                            around the obstruction into the second portion of                            the duodenum and the wire was advanced and the  stent was advanced and on the first attempt when we                            withdrew back to the stomach the stent withdrew                            back to the stomach as well and this maneuver was                            repeated and the stent was deemed in proper                            position under fluoroscopy and endoscopically and                            that is when we released the stent under                            fluoroscopy and endoscopic guidance and the upper                            GI endoscopy was technically difficult and complex                            due to abnormal anatomy and extrinsic compression.                            Successful completion of the procedure was aided by                            performing the maneuvers documented (below) in this                            report. The patient tolerated the procedure well. Scope In: Scope Out: Findings:      Moderately severe esophagitis with no bleeding  was found.      A medium amount of food (residue) was found in the gastric body. Some of       it suctioned throughout the procedure      One partially obstructing non-bleeding cratered duodenal ulcer was found       in the duodenal bulb.      An acquired malignant-appearing, intrinsic moderate stenosis was found       in the duodenal bulb and was traversed as above. This was stented with a       22 mm x 9 cm WallFlex stent under fluoroscopic guidance. Estimated blood       loss: none.      The second portion of the duodenum was normal.      The exam was otherwise without abnormality. Impression:               - Moderately severe reflux and erosive esophagitis                            with no bleeding.                           -  A medium amount of food (residue) in the stomach.                           - Partially obstructing non-bleeding duodenal                            ulcer. [Etiology (AutoImpression)].                           - Acquired duodenal stenosis. Prosthesis placed.                           - Normal second portion of the duodenum.                           - The examination was otherwise normal.                           - No specimens collected. Moderate Sedation:      Not Applicable - Patient had care per Anesthesia. Recommendation:           - Clear liquid diet today. And probably tomorrow                           - Continue present medications. Care with blood                            thinners based on endoscopic appearance of both the                            duodenal bulb mass as well as her esophagus                           - Use Protonix (pantoprazole) 40 mg PO BID                            indefinitely.                           - Return to GI clinic PRN.                           - Telephone GI clinic if symptomatic PRN. Procedure Code(s):        --- Professional ---                           [phone], Esophagogastroduodenoscopy, flexible,                             transoral; with placement of endoscopic stent                            (includes pre- and post-dilation and guide wire                            passage, when performed)  Diagnosis Code(s):        --- Professional ---                           K21.00, Gastro-esophageal reflux disease with                            esophagitis, without bleeding                           K20.80, Other esophagitis without bleeding                           K26.9, Duodenal ulcer, unspecified as acute or                            chronic, without hemorrhage or perforation                           K31.5, Obstruction of duodenum                           R11.2, Nausea with vomiting, unspecified                           R93.3, Abnormal findings on diagnostic imaging of                            other parts of digestive tract CPT copyright 2019 [employer] Medical Association. All rights reserved. The codes documented in this report are preliminary and upon coder review may  be revised to meet current compliance requirements. Clarene Essex, MD 01/08/2022 1:56:23 PM This report has been signed electronically. Number of Addenda: 0

## 2022-01-08 NOTE — Progress Notes (Signed)
Pt hasn't voided since Tuesday night 01/06/22. Pt stated that " I feel like peeing but I cant pee". This RN and CN bladder scanned pt but are getting false readings. NP Olena Heckle made aware and received order for in and out cath. In and out pt and had 450 mL urine output. Will continue to monitor pt.

## 2022-01-08 NOTE — Consult Note (Signed)
Consultation Note Date: 01/08/2022   Patient Name: Leslie Mullen  DOB: 07-08-1961  MRN: [id number]  Age / Sex: 61 y.o., female  PCP: Elinor Parkinson Referring Physician: Shawna Clamp, MD  Reason for Consultation: Establishing goals of care  HPI/Patient Profile: 61 y.o. female   admitted on 01/07/2022    Clinical Assessment and Goals of Care: 61 year old lady with life limiting illness of gastric outlet obstruction deemed secondary to pancreatic mass, patient of Dr. Burr Medico for metastatic pancreatic cancer, metastases to liver.  Patient last dose of chemotherapy was on 12-29-2021. Palliative consultation has been requested for goals of care discussions. Patient is resting in bed.  Her husband is present at bedside. Palliative medicine is specialized medical care for people living with serious illness. It focuses on providing relief from the symptoms and stress of a serious illness. The goal is to improve quality of life for both the patient and the family. Goals of care: Broad aims of medical therapy in relation to the patient's values and preferences. Our aim is to provide medical care aimed at enabling patients to achieve the goals that matter most to them, given the circumstances of their particular medical situation and their constraints.  Offered active listening and supportive care, attempted to establish a trust relationship, provided empathic listening and other therapeutic techniques at the time of this initial palliative encounter.  Patient in anticipation of procedure today.  Has NG tube.  See further discussions below.  Thank you for the consult.     NEXT OF KIN Husband at bedside, lives at home with husband in [place], [place].  Has children who live locally.  SUMMARY OF RECOMMENDATIONS   Full code for now, discussed with patient today about DNR/DNI, she wishes to continue these  discussions with her husband and rest of the family. Recommend home with hospice: Discussed with patient about scope of hospice services and how they can help keep her comfortable and maintain her quality of life going forward in her home setting.  Patient is considering hospice services, wishes to discuss further with her family. For today, patient in anticipation of her procedure, she asks questions about scope and purpose of stent, upper endoscopy and what her most recent abdominal imaging shows.  Discussed with patient about this to the best of my ability. Thank you for the consult.  Code Status/Advance Care Planning: Full code   Symptom Management:     Palliative Prophylaxis:  Bowel Regimen   Psycho-social/Spiritual:  Desire for further Chaplaincy support:yes Additional Recommendations: Caregiving  Support/Resources  Prognosis:  < 6 months  Discharge Planning: Home with Hospice      Primary Diagnoses: Present on Admission:  Gastric outlet obstruction  Paroxysmal atrial fibrillation (HCC)  Pancreatic cancer metastasized to liver ([place])  AKI (acute kidney injury) ([place])  Sepsis ([place])   I have reviewed the medical record, interviewed the patient and family, and examined the patient. The following aspects are pertinent.  Past Medical History:  Diagnosis Date   Abnormal uterine bleeding  Allergy    Anemia    Asthma    Cataract    BOTH EYES   Complete heart block ([place])    a. [employer] PPM 10/2020.   Diabetes ([place])    type II    Diabetic neuropathy ([place])    Endometrial cancer ([place])    Family history of colon cancer    GERD (gastroesophageal reflux disease)    Hyperlipidemia    Hypertension    Morbid obesity ([place])    Osteoarthritis    right knee    Pancreatitis    Pneumonia    Social History   Socioeconomic History   Marital status: Married    Spouse name: Not on file   Number of children: 2   Years of education: Not on file   Highest education level:  Not on file  Occupational History   Occupation: Intake Coordinator  Tobacco Use   Smoking status: Former    Types: Cigarettes   Smokeless tobacco: Never   Tobacco comments:    Former smoker (09/18/2021)  Vaping Use   Vaping Use: Never used  Substance and Sexual Activity   Alcohol use: No    Alcohol/week: 0.0 standard drinks   Drug use: No   Sexual activity: Not on file  Other Topics Concern   Not on file  Social History Narrative   Not on file   Social Determinants of Health   Financial Resource Strain: Not on file  Food Insecurity: Not on file  Transportation Needs: Not on file  Physical Activity: Not on file  Stress: Not on file  Social Connections: Not on file   Family History  Adopted: Yes  Problem Relation Age of Onset   Diabetes Father    Heart disease Father    Cancer Mother        colon   Colon cancer Neg Hx    Esophageal cancer Neg Hx    Rectal cancer Neg Hx    Stomach cancer Neg Hx    Scheduled Meds:  insulin aspart  0-20 Units Subcutaneous TID WC   insulin aspart  0-5 Units Subcutaneous QHS   pantoprazole (PROTONIX) IV  40 mg Intravenous Q12H   Continuous Infusions:  sodium chloride 100 mL/hr at 01/08/22 0435   sodium chloride 10 mL/hr at 01/08/22 0818   ceFEPime (MAXIPIME) IV 2 g (01/08/22 0747)   heparin Stopped (01/08/22 0813)   metronidazole 500 mg (01/08/22 0610)   potassium chloride 10 mEq (01/08/22 1039)   promethazine (PHENERGAN) injection (IM or IVPB)     PRN Meds:.sodium chloride, metoprolol tartrate, morphine injection, prochlorperazine, promethazine (PHENERGAN) injection (IM or IVPB) Medications Prior to Admission:  Prior to Admission medications   Medication Sig Start Date End Date Taking? Authorizing Provider  acetaminophen (TYLENOL) 500 MG tablet Take 1,000 mg by mouth every 6 (six) hours as needed for moderate pain.    [provider]  amLODipine (NORVASC) 10 MG tablet Take 1 tablet (10 mg total) by mouth at bedtime.  01/03/21   [name], MD  apixaban (ELIQUIS) 5 MG TABS tablet Take 1 tablet (5 mg total) by mouth 2 (two) times daily. 12/23/21   Little Ishikawa, MD  bisacodyl (DULCOLAX) 5 MG EC tablet Take 1 tablet (5 mg total) by mouth daily as needed for severe constipation. 01/05/22   Aline August, MD  doxycycline (ADOXA) 100 MG tablet Take 1 tablet (100 mg total) by mouth 2 (two) times daily for 5 days. 01/05/22 01/10/22  Aline August,  MD  gabapentin (NEURONTIN) 300 MG capsule Take 1 capsule (300 mg total) by mouth 2 (two) times daily. 10/12/20   Evans Lance, MD  lidocaine-prilocaine (EMLA) cream Apply to affected area once Patient taking differently: Apply 1 application topically daily as needed (port access). 10/14/21   Truitt Merle, MD  loratadine (CLARITIN) 10 MG tablet Take 10 mg by mouth daily as needed for allergies.    [provider]  LORazepam (ATIVAN) 0.5 MG tablet Take 1 tablet (0.5 mg total) by mouth 2 (two) times daily as needed (for anxiety, and/or chemo-related nausea/vomiting). 11/13/21   Alla Feeling, NP  magic mouthwash w/lidocaine SOLN Take 5 mLs by mouth 4 (four) times daily as needed for mouth pain. 01/05/22   Aline August, MD  Menthol, Topical Analgesic, (BIOFREEZE ROLL-ON EX) Apply 1 application topically as needed (pain).    [provider]  mirtazapine (REMERON) 15 MG tablet Take 1 tablet (15 mg total) by mouth at bedtime. 12/11/21   Truitt Merle, MD  Multiple Vitamin (MULTIVITAMIN) capsule Take 1 capsule by mouth daily.    [provider]  omeprazole (PRILOSEC) 20 MG capsule Take 20 mg by mouth daily. 12/18/21   [provider]  ondansetron (ZOFRAN) 8 MG tablet TAKE 1 TABLET(8 MG) BY MOUTH TWICE DAILY AS NEEDED FOR NAUSEA OR VOMITING 01/05/22   Truitt Merle, MD  Oxycodone HCl 10 MG TABS Take 1 tablet (10 mg total) by mouth every 6 (six) hours as needed. Patient taking differently: Take 10 mg by mouth every 6 (six) hours as needed (pain). 12/11/21    Truitt Merle, MD  polyethylene glycol (MIRALAX / GLYCOLAX) 17 g packet Take 17 g by mouth daily as needed for moderate constipation.    [provider]  pravastatin (PRAVACHOL) 40 MG tablet TAKE 1 TABLET(40 MG) BY MOUTH DAILY Patient taking differently: Take 40 mg by mouth daily. 07/23/20   [name], MD  prochlorperazine (COMPAZINE) 10 MG tablet TAKE 1 TABLET(10 MG) BY MOUTH EVERY 6 HOURS AS NEEDED FOR NAUSEA OR VOMITING Patient taking differently: Take 10 mg by mouth every 6 (six) hours as needed for nausea or vomiting. 12/16/21   Alla Feeling, NP  senna-docusate (SENOKOT-S) 8.6-50 MG tablet Take 1 tablet by mouth 2 (two) times daily. 01/05/22   Aline August, MD  SYNJARDY XR 12.04-999 MG TB24 Take 1 tablet by mouth 2 (two) times daily. 05/30/21   [provider]  trolamine salicylate (ASPERCREME) 10 % cream Apply 1 application topically 2 (two) times daily as needed for muscle pain.    [provider]   Allergies  Allergen Reactions   Penicillins Shortness Of Breath, Itching and Swelling    Has patient had a PCN reaction causing immediate rash, facial/tongue/throat swelling, SOB or lightheadedness with hypotension: Yes Has patient had a PCN reaction causing severe rash involving mucus membranes or skin necrosis:No Has patient had a PCN reaction that required hospitalization: No Has patient had a PCN reaction occurring within the last 10 years: no If all of the above answers are "NO", then may proceed with Cephalosporin use.  *Per patient, tolerated Keflex in the past     Review of Systems +abdominal pain  Physical Exam Awake alert resting in bed No acute distress Complains of mild generalized abdominal discomfort Regular work of breathing Has NG tube with some bilious output in canister noted  Vital Signs: BP 134/69 (BP Location: Left Wrist)    Pulse 91    Temp 98.2  F (36.8 C) (Oral)    Resp 20    Ht 5\' 8"  (1.727 m)    Wt 136.1 kg    SpO2 94%     BMI 45.61 kg/m  Pain Scale: 0-10   Pain Score: 8    SpO2: SpO2: 94 % O2 Device:SpO2: 94 % O2 Flow Rate: .   IO: Intake/output summary:  Intake/Output Summary (Last 24 hours) at 01/08/2022 1102 Last data filed at 01/08/2022 7353 Gross per 24 hour  Intake 2093.92 ml  Output 700 ml  Net 1393.92 ml    LBM: Last BM Date: 01/05/22 Baseline Weight: Weight: (!) 137.8 kg Most recent weight: Weight: 136.1 kg     Palliative Assessment/Data:   PPS 50%  Time In:  10 Time Out:  11 Time Total:  60  Greater than 50%  of this time was spent counseling and coordinating care related to the above assessment and plan.  Signed by: Loistine Chance, MD   Please contact Palliative Medicine Team phone at [phone] for questions and concerns.  For individual provider: See Shea Evans

## 2022-01-08 NOTE — Anesthesia Postprocedure Evaluation (Signed)
Anesthesia Post Note  Patient: Leslie Mullen  Procedure(s) Performed: ESOPHAGOGASTRODUODENOSCOPY (EGD) DUODENAL STENT PLACEMENT     Patient location during evaluation: PACU Anesthesia Type: General Level of consciousness: awake and alert Pain management: pain level controlled Vital Signs Assessment: post-procedure vital signs reviewed and stable Respiratory status: spontaneous breathing, nonlabored ventilation and respiratory function stable Cardiovascular status: blood pressure returned to baseline Postop Assessment: no apparent nausea or vomiting Anesthetic complications: no   No notable events documented.  Last Vitals:  Vitals:   01/08/22 1402 01/08/22 1412  BP: 132/63 (!) 142/64  Pulse: 86 80  Resp: 13 13  Temp:    SpO2: 97% 100%    Last Pain:  Vitals:   01/08/22 1412  TempSrc:   PainSc: 0-No pain                 Marthenia Rolling

## 2022-01-08 NOTE — Anesthesia Preprocedure Evaluation (Addendum)
Anesthesia Evaluation  Patient identified by MRN, date of birth, ID band Patient awake    Reviewed: Allergy & Precautions, NPO status , Patient's Chart, lab work & pertinent test results  History of Anesthesia Complications Negative for: history of anesthetic complications  Airway Mallampati: II  TM Distance: >3 FB Neck ROM: Full    Dental  (+) Loose,    Pulmonary asthma , former smoker,    Pulmonary exam normal        Cardiovascular hypertension, Pt. on medications Normal cardiovascular exam+ pacemaker (3rd degree AVB)      Neuro/Psych Anxiety negative neurological ROS     GI/Hepatic Neg liver ROS, GERD  Medicated and Controlled,coffee gound emesis, gastric outlet obstruction, NSAID use   Endo/Other  diabetes, Type 2, Oral Hypoglycemic AgentsMorbid obesity  Renal/GU Renal InsufficiencyRenal disease (Cr 1.9)  negative genitourinary   Musculoskeletal  (+) Arthritis ,   Abdominal   Peds  Hematology  (+) anemia , Hgb 7.4   Anesthesia Other Findings Day of surgery medications reviewed with patient.  Reproductive/Obstetrics negative OB ROS                            Anesthesia Physical Anesthesia Plan  ASA: 3  Anesthesia Plan: General   Post-op Pain Management: Minimal or no pain anticipated   Induction: Intravenous and Rapid sequence  PONV Risk Score and Plan: 3 and Treatment may vary due to age or medical condition, Ondansetron, Dexamethasone and Midazolam  Airway Management Planned: Oral ETT  Additional Equipment: None  Intra-op Plan:   Post-operative Plan: Extubation in OR  Informed Consent: I have reviewed the patients History and Physical, chart, labs and discussed the procedure including the risks, benefits and alternatives for the proposed anesthesia with the patient or authorized representative who has indicated his/her understanding and acceptance.     Dental  advisory given  Plan Discussed with: CRNA  Anesthesia Plan Comments:        Anesthesia Quick Evaluation

## 2022-01-08 NOTE — Progress Notes (Addendum)
PROGRESS NOTE    Leslie Mullen  HEN:[id number] DOB: 02-12-1961 DOA: 01/07/2022 PCP: Sue Lush, PA-C    Brief Narrative: This 61 years old female with PMH significant for CHB, s/p PPM, DM 2, hypertension, metastatic pancreatic cancer, endometrial cancer, morbid obesity presented in the ED with complaints of nausea and vomiting.  She was recently hospitalized from 1/26 - 1/30 for sepsis secondary to port infection.  At that time she was sent home with 5 days of oral doxycycline.  Blood cultures were negative.  Patient has acutely developed nausea and vomiting,  She was unable to take anything down she has tried Compazine and antacids did not help.  She was feeling dizzy by end of day with worsening nausea and vomiting. Patient is admitted for sepsis secondary to pancreatic infection versus left lower lobe pneumonia.  She is also found to have gastric outlet obstruction, general surgery, gastroenterology, oncology, palliative care consulted.  Assessment & Plan:   Principal Problem:   Gastric outlet obstruction Active Problems:   Type 2 diabetes mellitus with diabetic polyneuropathy, with long-term current use of insulin (HCC)   Paroxysmal atrial fibrillation (HCC)   AKI (acute kidney injury) ([place])   Pancreatic cancer metastasized to liver (HCC)   Sepsis (HCC)  Sepsis sec. to pancreatic infection versus left lower lobe pneumonia: Continue IV antibiotics (cefepime,  Flagyl) Continue IV fluids.  Continue supportive care.  Lactic acid normalized with IV hydration, back to 2.1 again. Procalcitonin 0.67. Follow blood cultures.  Gastric outlet obstruction: Pancreatic cancer with mets to the liver: Patient with metastatic pancreatic cancer followed by Dr. Annamaria Boots on palliative chemotherapy presented with nausea, vomiting and found to have gastric outlet obstruction secondary to pancreatic head lesion.  Continue NG tube decompression.  Continue NPO. Patient had poor prognosis and poor tolerance  to chemotherapy.  she is not a candidate for more cytotoxic therapy.  Goals of care at this point is palliative and comfort. GI consulted, EGD attempted with possible stent placement. Patient is scheduled to have venting G-tube for comfort.  J tube feeding through PEG if possible Palliative care consulted.  Patient is considering hospice services.  Wishes to discuss further with her family.  Diabetes mellitus type 2: Continue sliding scale.  AKI: Could be due to dehydration, Dec. PO intake. Continue IV hydration. Trend creatinine. Avoid nephrotoxic medications CT abdomen without obstruction.  Essential hypertension: Hydralazine as needed. Resume home medication once patient is able to take p.o.  Paroxysmal A-fib: Heart rate controlled.  Continue heparin GTT.  Hypokalemia: Replaced.  Continue to monitor.  Normochromic normocytic anemia: Her hemoglobin has dropped from 8.0-7.4 Monitor H&H.  DVT prophylaxis: Heparin gtt Code Status: Full code. Family Communication: No family at bed side. Disposition Plan:   Status is: Inpatient Remains inpatient appropriate because: Admitted for sepsis and gastric outlet obstruction in the setting of pancreatic metastatic carcinoma.  General surgery GI and oncology consulted.  Patient is going to have a vent gastrostomy tube for comfort and J-tube for feeding.   Consultants:  Oncology General surgery Gastroenterology Palliative care  Procedures: Gastrostomy tube  antimicrobials:   Anti-infectives (From admission, onward)    Start     Dose/Rate Route Frequency Ordered Stop   01/07/22 2000  [MAR Hold]  ceFEPIme (MAXIPIME) 2 g in sodium chloride 0.9 % 100 mL IVPB        (MAR Hold since Thu 01/08/2022 at 1218.Hold Reason: Transfer to a Procedural area)   2 g 200 mL/hr over 30 Minutes Intravenous Every  12 hours 01/07/22 0935     01/07/22 1800  [MAR Hold]  metroNIDAZOLE (FLAGYL) IVPB 500 mg        (MAR Hold since Thu 01/08/2022 at 1218.Hold  Reason: Transfer to a Procedural area)   500 mg 100 mL/hr over 60 Minutes Intravenous Every 12 hours 01/07/22 1159 01/14/22 1759   01/07/22 0645  ceFEPIme (MAXIPIME) 2 g in sodium chloride 0.9 % 100 mL IVPB        2 g 200 mL/hr over 30 Minutes Intravenous  Once 01/07/22 0641 01/07/22 0822   01/07/22 0645  metroNIDAZOLE (FLAGYL) IVPB 500 mg        500 mg 100 mL/hr over 60 Minutes Intravenous  Once 01/07/22 0641 01/07/22 0757        Subjective: Patient was seen and examined at bedside.  Overnight events noted.   Patient reported her abdomen is soft and feels some better after NG tube is placed. She denies any nausea, vomiting  Objective: Vitals:   01/08/22 1141 01/08/22 1229 01/08/22 1234 01/08/22 1352  BP: 129/62 (!) 132/59  (!) 114/56  Pulse: 89 96  84  Resp: 16 14  13   Temp: 98.9 F (37.2 C) 98 F (36.7 C)  98.7 F (37.1 C)  TempSrc: Oral Tympanic    SpO2: 93% 91% 97% 99%  Weight:  136 kg    Height:  5\' 8"  (1.727 m)      Intake/Output Summary (Last 24 hours) at 01/08/2022 1403 Last data filed at 01/08/2022 1355 Gross per 24 hour  Intake 2593.92 ml  Output 700 ml  Net 1893.92 ml   Filed Weights   01/07/22 0500 01/07/22 0502 01/08/22 1229  Weight: (!) 137.8 kg 136.1 kg 136 kg    Examination:  General exam: Appears comfortable, chronically ill looking, deconditioned. Respiratory system: Clear to auscultation. Respiratory effort normal.  RR 15 Cardiovascular system: S1 & S2 heard, regular rate and rhythm, no murmur. Gastrointestinal system: Abdomen is soft, nondistended, nontender, hypoactive bowel sounds Central nervous system: Alert and oriented x 3. No focal neurological deficits. Extremities: Bilateral pedal edema, no cyanosis no clubbing. Skin: No rashes, lesions or ulcers Psychiatry: Judgement and insight appear normal. Mood & affect appropriate.     Data Reviewed: I have personally reviewed following labs and imaging studies  CBC: Recent Labs  Lab  01/02/22 0127 01/03/22 0301 01/04/22 0414 01/05/22 0104 01/07/22 0556 01/08/22 0457 01/08/22 0816  WBC 17.7* 9.5 5.1 6.7 15.3* 15.2*  --   NEUTROABS 15.6* 7.7 3.1 4.3  --   --   --   HGB 8.9* 7.3* 8.3* 8.0* 10.6* 7.2* 7.4*  HCT 27.9* 23.6* 25.9* 25.3* 33.1* 22.3* 22.7*  MCV 90.0 91.1 90.9 91.7 90.2 91.4  --   PLT 228 208 180 184 294 204  --    Basic Metabolic Panel: Recent Labs  Lab 01/03/22 0301 01/04/22 0414 01/05/22 0104 01/07/22 0558 01/08/22 0457  NA 134* 138 139 141 142  K 4.3 3.9 3.8 3.8 2.9*  CL 105 111 106 93* 95*  CO2 24 22 24 25  35*  GLUCOSE 196* 178* 175* 376* 215*  BUN 17 13 11  24* 33*  CREATININE 1.45* 1.28* 1.20* 1.69* 1.90*  CALCIUM 6.9* 7.1* 7.7* 8.9 7.7*  MG 1.8 1.9 2.0  --   --    GFR: Estimated Creatinine Clearance: 46.7 mL/min (A) (by C-G formula based on SCr of 1.9 mg/dL (H)). Liver Function Tests: Recent Labs  Lab 01/03/22 0301 01/05/22 0104 01/07/22 0558 01/08/22  0457  AST 16 17 17  14*  ALT 9 10 13 10   ALKPHOS 80 82 86 57  BILITOT 0.2* 0.2* 0.8 0.9  PROT 5.1* 6.0* 8.2* 5.4*  ALBUMIN <1.5* <1.5* 2.3* 1.9*   Recent Labs  Lab 01/07/22 0558  LIPASE 43   No results for input(s): AMMONIA in the last 168 hours. Coagulation Profile: Recent Labs  Lab 01/07/22 0558 01/08/22 0457  INR 1.6* 1.7*   Cardiac Enzymes: Recent Labs  Lab 01/02/22 0127  CKTOTAL 82   BNP (last 3 results) No results for input(s): PROBNP in the last 8760 hours. HbA1C: Recent Labs    01/07/22 0559  HGBA1C 6.7*   CBG: Recent Labs  Lab 01/07/22 1801 01/07/22 2055 01/08/22 0123 01/08/22 0724 01/08/22 1138  GLUCAP 268* 212* 190* 221* 182*   Lipid Profile: No results for input(s): CHOL, HDL, LDLCALC, TRIG, CHOLHDL, LDLDIRECT in the last 72 hours. Thyroid Function Tests: No results for input(s): TSH, T4TOTAL, FREET4, T3FREE, THYROIDAB in the last 72 hours. Anemia Panel: No results for input(s): VITAMINB12, FOLATE, FERRITIN, TIBC, IRON, RETICCTPCT in  the last 72 hours. Sepsis Labs: Recent Labs  Lab 01/07/22 0556 01/07/22 0838 01/07/22 1240 01/07/22 1717 01/08/22 0457  PROCALCITON  --   --   --   --  0.26  LATICACIDVEN 2.3* 2.5* 1.6 2.1*  --     Recent Results (from the past 240 hour(s))  Resp Panel by RT-PCR (Flu A&B, Covid) Nasopharyngeal Swab     Status: None   Collection Time: 01/01/22  8:16 AM   Specimen: Nasopharyngeal Swab; Nasopharyngeal(NP) swabs in vial transport medium  Result Value Ref Range Status   SARS Coronavirus 2 by RT PCR NEGATIVE NEGATIVE Final    Comment: (NOTE) SARS-CoV-2 target nucleic acids are NOT DETECTED.  The SARS-CoV-2 RNA is generally detectable in upper respiratory specimens during the acute phase of infection. The lowest concentration of SARS-CoV-2 viral copies this assay can detect is 138 copies/mL. A negative result does not preclude SARS-Cov-2 infection and should not be used as the sole basis for treatment or other patient management decisions. A negative result may occur with  improper specimen collection/handling, submission of specimen other than nasopharyngeal swab, presence of viral mutation(s) within the areas targeted by this assay, and inadequate number of viral copies(<138 copies/mL). A negative result must be combined with clinical observations, patient history, and epidemiological information. The expected result is Negative.  Fact Sheet for Patients:  EntrepreneurPulse.com.au  Fact Sheet for Healthcare Providers:  IncredibleEmployment.be  This test is no t yet approved or cleared by the [place] FDA and  has been authorized for detection and/or diagnosis of SARS-CoV-2 by FDA under an Emergency Use Authorization (EUA). This EUA will remain  in effect (meaning this test can be used) for the duration of the COVID-19 declaration under Section 564(b)(1) of the Act, 21 U.S.C.section 360bbb-3(b)(1), unless the authorization is terminated   or revoked sooner.       Influenza A by PCR NEGATIVE NEGATIVE Final   Influenza B by PCR NEGATIVE NEGATIVE Final    Comment: (NOTE) The Xpert Xpress SARS-CoV-2/FLU/RSV plus assay is intended as an aid in the diagnosis of influenza from Nasopharyngeal swab specimens and should not be used as a sole basis for treatment. Nasal washings and aspirates are unacceptable for Xpert Xpress SARS-CoV-2/FLU/RSV testing.  Fact Sheet for Patients: EntrepreneurPulse.com.au  Fact Sheet for Healthcare Providers: IncredibleEmployment.be  This test is not yet approved or cleared by the [place] and  has been authorized for detection and/or diagnosis of SARS-CoV-2 by FDA under an Emergency Use Authorization (EUA). This EUA will remain in effect (meaning this test can be used) for the duration of the COVID-19 declaration under Section 564(b)(1) of the Act, 21 U.S.C. section 360bbb-3(b)(1), unless the authorization is terminated or revoked.  Performed at [facility], [place] [address] [place], [place] [address]   Urine Culture     Status: Abnormal   Collection Time: 01/01/22  8:43 AM   Specimen: Urine, Clean Catch  Result Value Ref Range Status   Specimen Description URINE, CLEAN CATCH  Final   Special Requests   Final    NONE Performed at [facility], [place] [address] [place], [place] [address]    Culture (A)  Final    >=100,000 COLONIES/mL MULTIPLE SPECIES PRESENT, SUGGEST RECOLLECTION   Report Status 01/02/2022 FINAL  Final  Culture, blood (routine x 2)     Status: None   Collection Time: 01/01/22 11:52 AM   Specimen: BLOOD  Result Value Ref Range Status   Specimen Description BLOOD BLOOD RIGHT ARM  Final   Special Requests   Final    BOTTLES DRAWN AEROBIC AND ANAEROBIC Blood Culture results may not be optimal due to an inadequate volume of blood received in culture bottles   Culture   Final    NO GROWTH 5 DAYS Performed at [facility], [place] [address], [place], [place] [address]    Report Status 01/06/2022 FINAL  Final  Culture, blood (routine x 2)     Status: None   Collection Time: 01/02/22  1:27 AM   Specimen: BLOOD  Result Value Ref Range Status   Specimen Description BLOOD SITE NOT SPECIFIED  Final   Special Requests   Final    AEROBIC BOTTLE ONLY Blood Culture results may not be optimal due to an inadequate volume of blood received in culture bottles   Culture   Final    NO GROWTH 5 DAYS Performed at [facility], [place] [address] [place], [place] [address]    Report Status 01/07/2022 FINAL  Final  Blood Culture (routine x 2)     Status: None (Preliminary result)   Collection Time: 01/07/22  5:59 AM   Specimen: Site Not Specified; Blood  Result Value Ref Range Status   Specimen Description   Final    SITE NOT SPECIFIED Performed at [facility] [address] [place], [place] [address]    Special Requests   Final    BOTTLES DRAWN AEROBIC AND ANAEROBIC Blood Culture adequate volume Performed at [facility] [address], [place], [place] [address]    Culture   Final    NO GROWTH 1 DAY Performed at [facility], [place] [address] [place], [place] [address]    Report Status PENDING  Incomplete  Resp Panel by RT-PCR (Flu A&B, Covid) Nasopharyngeal Swab     Status: None   Collection Time: 01/07/22  8:38 AM   Specimen: Nasopharyngeal Swab; Nasopharyngeal(NP) swabs in vial transport medium  Result Value Ref Range Status   SARS Coronavirus 2 by RT PCR NEGATIVE NEGATIVE Final    Comment: (NOTE) SARS-CoV-2 target nucleic acids are NOT DETECTED.  The SARS-CoV-2 RNA is generally detectable in upper respiratory specimens during the acute phase of infection. The lowest concentration of SARS-CoV-2 viral copies this assay can detect is 138 copies/mL. A negative result does not preclude SARS-Cov-2  infection and should not be used as the sole basis for  treatment or other patient management decisions. A negative result may occur with  improper specimen collection/handling, submission of specimen other than nasopharyngeal swab, presence of viral mutation(s) within the areas targeted by this assay, and inadequate number of viral copies(<138 copies/mL). A negative result must be combined with clinical observations, patient history, and epidemiological information. The expected result is Negative.  Fact Sheet for Patients:  EntrepreneurPulse.com.au  Fact Sheet for Healthcare Providers:  IncredibleEmployment.be  This test is no t yet approved or cleared by the [place] FDA and  has been authorized for detection and/or diagnosis of SARS-CoV-2 by FDA under an Emergency Use Authorization (EUA). This EUA will remain  in effect (meaning this test can be used) for the duration of the COVID-19 declaration under Section 564(b)(1) of the Act, 21 U.S.C.section 360bbb-3(b)(1), unless the authorization is terminated  or revoked sooner.       Influenza A by PCR NEGATIVE NEGATIVE Final   Influenza B by PCR NEGATIVE NEGATIVE Final    Comment: (NOTE) The Xpert Xpress SARS-CoV-2/FLU/RSV plus assay is intended as an aid in the diagnosis of influenza from Nasopharyngeal swab specimens and should not be used as a sole basis for treatment. Nasal washings and aspirates are unacceptable for Xpert Xpress SARS-CoV-2/FLU/RSV testing.  Fact Sheet for Patients: EntrepreneurPulse.com.au  Fact Sheet for Healthcare Providers: IncredibleEmployment.be  This test is not yet approved or cleared by the [place] FDA and has been authorized for detection and/or diagnosis of SARS-CoV-2 by FDA under an Emergency Use Authorization (EUA). This EUA will remain in effect (meaning this test can be used) for the duration of the COVID-19 declaration under Section 564(b)(1) of the Act, 21  U.S.C. section 360bbb-3(b)(1), unless the authorization is terminated or revoked.  Performed at [facility], [place] [address], [place], [place] [address]   Blood Culture (routine x 2)     Status: None (Preliminary result)   Collection Time: 01/07/22  5:17 PM   Specimen: BLOOD  Result Value Ref Range Status   Specimen Description   Final    BLOOD RIGHT ANTECUBITAL Performed at [facility] [address], [place], [place] [address]    Special Requests   Final    BOTTLES DRAWN AEROBIC ONLY Blood Culture results may not be optimal due to an inadequate volume of blood received in culture bottles Performed at [facility] [address], [place], [place] [address]    Culture   Final    NO GROWTH < 12 HOURS Performed at [facility] [address], [place], [place] [address]    Report Status PENDING  Incomplete         Radiology Studies: CT ABDOMEN PELVIS WO CONTRAST  Result Date: 01/07/2022 CLINICAL DATA:  Metastatic pancreatic cancer. Hyperglycemia. Possible small bowel obstruction. EXAM: CT ABDOMEN AND PELVIS WITHOUT CONTRAST TECHNIQUE: Multidetector CT imaging of the abdomen and pelvis was performed following the standard protocol without IV contrast. RADIATION DOSE REDUCTION: This exam was performed according to the departmental dose-optimization program which includes automated exposure control, adjustment of the mA and/or kV according to patient size and/or use of iterative reconstruction technique. COMPARISON:  10/25/2021 FINDINGS: Lower chest: Ground-glass opacity seen in the posterior right lower lobe previously has become more confluent and organized in the interval with a masslike character today measuring 2.2 x 1.5 cm. There is patchy airspace disease in the lingula and left lower  lobe, new in the interval. Hepatobiliary: No focal abnormality in the liver on this study without intravenous contrast. The liver shows  diffusely decreased attenuation suggesting fat deposition. Increased attenuation of contents in the gallbladder lumen may be related to sludge. No intrahepatic or extrahepatic biliary dilation. Pancreas: Interval decrease in size of the pancreatic head mass. Lesion is poorly defined on today's noncontrast study but measures approximately 4.1 x 4.0 cm compared to 6.0 x 5.3 cm previously. There is some trace gas in the region of the mass lesion today which could represent necrosis although superinfection cannot be excluded. Spleen: No splenomegaly. No focal mass lesion. Adrenals/Urinary Tract: No adrenal nodule or mass. Kidneys unremarkable. No evidence for hydroureter. Bladder is decompressed. Stomach/Bowel: Stomach is markedly distended with fluid. Duodenum is decompressed and small bowel loops are diffusely decompressed throughout. The terminal ileum is normal. The appendix is normal. Colon is decompressed with mild diverticular disease on the left. Vascular/Lymphatic: There is mild atherosclerotic calcification of the abdominal aorta without aneurysm. There is no gastrohepatic or hepatoduodenal ligament lymphadenopathy. No retroperitoneal or mesenteric lymphadenopathy. No pelvic sidewall lymphadenopathy. Reproductive: IUD is visualized in the uterus. There is no adnexal mass. Other: No intraperitoneal free fluid. Musculoskeletal: Small umbilical hernia contains only fat. No worrisome lytic or sclerotic osseous abnormality. IMPRESSION: 1. Interval decrease in size of the pancreatic head mass, poorly demonstrated on today's noncontrast CT. Trace gas in the region of the mass lesion today could represent necrosis although superinfection cannot be excluded. 2. Marked distention of the stomach with fluid. Duodenum is decompressed and small bowel loops are diffusely decompressed throughout. Imaging features are compatible with gastric outlet obstruction, likely secondary to involvement by the pancreatic head lesion. 3.  Interval development of patchy airspace disease in the lingula and left lower lobe. Imaging features are compatible with infection/inflammation. 4. Ground-glass opacity in the posterior right lower lobe has become more confluent and organized in the interval with a masslike character today measuring 2.2 x 1.5 cm. While likely infectious/inflammatory, close follow-up recommended. 5. Hepatic steatosis. 6. Small umbilical hernia contains only fat. 7. Aortic Atherosclerosis (ICD10-I70.0). Electronically Signed   By: Misty Stanley M.D.   On: 01/07/2022 06:55   DG Chest Port 1 View  Result Date: 01/07/2022 CLINICAL DATA:  Possible sepsis. EXAM: PORTABLE CHEST 1 VIEW COMPARISON:  01/01/2022 FINDINGS: 0605 hours low lung volumes. The cardio pericardial silhouette is enlarged. There is pulmonary vascular congestion without overt pulmonary edema. No evidence for pleural effusion. Prominent gastric bubble noted under the left hemidiaphragm. Left permanent pacemaker noted. Telemetry leads overlie the chest. IMPRESSION: Low volume chest x-ray with pulmonary vascular congestion. Electronically Signed   By: Misty Stanley M.D.   On: 01/07/2022 06:33    Scheduled Meds:  [MAR Hold] insulin aspart  0-20 Units Subcutaneous TID WC   [MAR Hold] insulin aspart  0-5 Units Subcutaneous QHS   [MAR Hold] pantoprazole (PROTONIX) IV  40 mg Intravenous Q12H   Continuous Infusions:  sodium chloride 100 mL/hr at 01/08/22 0435   sodium chloride     [MAR Hold] sodium chloride 10 mL/hr at 01/08/22 0818   [MAR Hold] ceFEPime (MAXIPIME) IV 2 g (01/08/22 0747)   heparin Stopped (01/08/22 0813)   [MAR Hold] metronidazole 500 mg (01/08/22 0610)   [MAR Hold] promethazine (PHENERGAN) injection (IM or IVPB)       LOS: 1 day    Time spent: 50 mins    Kameran Mcneese, MD [facility]   If 7PM-7AM, please contact night-coverage

## 2022-01-08 NOTE — Anesthesia Procedure Notes (Signed)
Procedure Name: Intubation Date/Time: 01/08/2022 1:01 PM Performed by: [place] Manus Rudd, CRNA Pre-anesthesia Checklist: Patient identified, Emergency Drugs available, Suction available and Patient being monitored Patient Re-evaluated:Patient Re-evaluated prior to induction Oxygen Delivery Method: Circle system utilized Preoxygenation: Pre-oxygenation with 100% oxygen Induction Type: IV induction and Rapid sequence Laryngoscope Size: Mac and 3 Grade View: Grade I Tube type: Oral Number of attempts: 1 Airway Equipment and Method: Stylet and Oral airway Placement Confirmation: ETT inserted through vocal cords under direct vision, positive ETCO2 and breath sounds checked- equal and bilateral Tube secured with: Tape Dental Injury: Teeth and Oropharynx as per pre-operative assessment

## 2022-01-08 NOTE — Progress Notes (Addendum)
ANTICOAGULATION CONSULT NOTE - Follow Up Consult  Pharmacy Consult for heparin Indication: atrial fibrillation- bridge therapy while NPO. On apixaban PTA  Allergies  Allergen Reactions   Penicillins Shortness Of Breath, Itching and Swelling    Has patient had a PCN reaction causing immediate rash, facial/tongue/throat swelling, SOB or lightheadedness with hypotension: Yes Has patient had a PCN reaction causing severe rash involving mucus membranes or skin necrosis:No Has patient had a PCN reaction that required hospitalization: No Has patient had a PCN reaction occurring within the last 10 years: no If all of the above answers are "NO", then may proceed with Cephalosporin use.  *Per patient, tolerated Keflex in the past      Patient Measurements: Height: 5\' 8"  (172.7 cm) Weight: 136.1 kg (300 lb) IBW/kg (Calculated) : 63.9 Heparin Dosing Weight: 96.7  Vital Signs: Temp: 98 F (36.7 C) (02/02 0031) Temp Source: Oral (02/02 0031) BP: 134/64 (02/02 0031) Pulse Rate: 91 (02/02 0031)  Labs: Recent Labs    01/07/22 0556 01/07/22 0558 01/07/22 0838 01/07/22 0941 01/07/22 1843 01/08/22 0457  HGB 10.6*  --   --   --   --  7.2*  HCT 33.1*  --   --   --   --  22.3*  PLT 294  --   --   --   --  204  APTT  --  24  --   --  61*  --   LABPROT  --  18.7*  --   --   --   --   INR  --  1.6*  --   --   --   --   HEPARINUNFRC  --   --   --  >1.10* >1.10* >1.10*  CREATININE  --  1.69*  --   --   --  1.90*  TROPONINIHS  --  16 13  --   --   --      Estimated Creatinine Clearance: 46.7 mL/min (A) (by C-G formula based on SCr of 1.9 mg/dL (H)).   Medical History: Past Medical History:  Diagnosis Date   Abnormal uterine bleeding    Allergy    Anemia    Asthma    Cataract    BOTH EYES   Complete heart block ([place])    a. Medtr[employer] PPM 10/2020.   Diabetes ([place])    type II    Diabetic neuropathy ([place])    Endometrial cancer ([place])    Family history of colon cancer    GERD  (gastroesophageal reflux disease)    Hyperlipidemia    Hypertension    Morbid obesity ([place])    Osteoarthritis    right knee    Pancreatitis    Pneumonia     Assessment: 61 yo F on apixaban 5 mg po BID PTA for PAF. She was just [facility] 1/30.  Pharmacy to dose heparin for bridge therapy while apixaban held for NPO/GOO.  Last dose apixaban 5 mg was 1/30 at 9 am (in hospital- pt reports she took no meds after DC home).   Today, 01/08/2022 aPTT 93 sec on heparin 1550 units/hr Heparin level > 1.1 due to effects of apixaban on board Plan for EGD with possible stent today ~1245. Spoke with GI, heparin drip held ~0800 this AM.    Goal of Therapy:  Heparin level 0.3-0.7 units/ml aPTT 66-102 seconds Monitor platelets by anticoagulation protocol: Yes   Plan:  F/u resumption of heparin drip after EGD this PM Daily heparin  level, aPTT and CBC while on heparin  Dimple Nanas, PharmD 01/08/2022 7:10 AM

## 2022-01-08 NOTE — Progress Notes (Addendum)
Subjective: CC: Patient reports since ngt placement her abdominal pain and nausea have improved. Some mild luq abdominal pain. NGT output has slowed. She reports a small episode of flatus. No bm  Planned for possible duodenal stent placement by GI today.   Objective: Vital signs in last 24 hours: Temp:  [98 F (36.7 C)-98.7 F (37.1 C)] 98 F (36.7 C) (02/02 0031) Pulse Rate:  [86-111] 91 (02/02 0031) Resp:  [10-29] 20 (02/02 0031) BP: (121-187)/(64-91) 134/64 (02/02 0031) SpO2:  [92 %-100 %] 92 % (02/02 0031) Last BM Date: 01/05/22  Intake/Output from previous day: 02/01 0701 - 02/02 0700 In: 2093.9 [I.V.:1893.9; IV Piggyback:200] Out: 700 [Urine:450; Emesis/NG output:250] Intake/Output this shift: No intake/output data recorded.  PE: Gen: Awake and alert, NAD Pulm: normal rate and effort  Abd: Obese, soft, upper abdominal tenderness without peritonitis, hypoactive BS. Prior C-section scar well healed. NGT in place w/ some bilous output in cannister.  Lab Results:  Recent Labs    01/07/22 0556 01/08/22 0457 01/08/22 0816  WBC 15.3* 15.2*  --   HGB 10.6* 7.2* 7.4*  HCT 33.1* 22.3* 22.7*  PLT 294 204  --    BMET Recent Labs    01/07/22 0558 01/08/22 0457  NA 141 142  K 3.8 2.9*  CL 93* 95*  CO2 25 35*  GLUCOSE 376* 215*  BUN 24* 33*  CREATININE 1.69* 1.90*  CALCIUM 8.9 7.7*   PT/INR Recent Labs    01/07/22 0558 01/08/22 0457  LABPROT 18.7* 19.8*  INR 1.6* 1.7*   CMP     Component Value Date/Time   NA 142 01/08/2022 0457   NA 137 12/22/2019 1712   K 2.9 (L) 01/08/2022 0457   CL 95 (L) 01/08/2022 0457   CO2 35 (H) 01/08/2022 0457   GLUCOSE 215 (H) 01/08/2022 0457   BUN 33 (H) 01/08/2022 0457   BUN 25 (H) 12/22/2019 1712   CREATININE 1.90 (H) 01/08/2022 0457   CREATININE 1.08 (H) 12/29/2021 1055   CALCIUM 7.7 (L) 01/08/2022 0457   PROT 5.4 (L) 01/08/2022 0457   PROT 7.0 12/22/2019 1712   ALBUMIN 1.9 (L) 01/08/2022 0457   ALBUMIN  3.6 (L) 12/22/2019 1712   AST 14 (L) 01/08/2022 0457   AST 17 12/29/2021 1055   ALT 10 01/08/2022 0457   ALT 8 12/29/2021 1055   ALKPHOS 57 01/08/2022 0457   BILITOT 0.9 01/08/2022 0457   BILITOT 0.4 12/29/2021 1055   GFRNONAA 30 (L) 01/08/2022 0457   GFRNONAA 59 (L) 12/29/2021 1055   GFRAA 49 (L) 12/22/2019 1712   Lipase     Component Value Date/Time   LIPASE 43 01/07/2022 0558    Studies/Results: CT ABDOMEN PELVIS WO CONTRAST  Result Date: 01/07/2022 CLINICAL DATA:  Metastatic pancreatic cancer. Hyperglycemia. Possible small bowel obstruction. EXAM: CT ABDOMEN AND PELVIS WITHOUT CONTRAST TECHNIQUE: Multidetector CT imaging of the abdomen and pelvis was performed following the standard protocol without IV contrast. RADIATION DOSE REDUCTION: This exam was performed according to the departmental dose-optimization program which includes automated exposure control, adjustment of the mA and/or kV according to patient size and/or use of iterative reconstruction technique. COMPARISON:  10/25/2021 FINDINGS: Lower chest: Ground-glass opacity seen in the posterior right lower lobe previously has become more confluent and organized in the interval with a masslike character today measuring 2.2 x 1.5 cm. There is patchy airspace disease in the lingula and left lower lobe, new in the interval. Hepatobiliary: No focal abnormality  in the liver on this study without intravenous contrast. The liver shows diffusely decreased attenuation suggesting fat deposition. Increased attenuation of contents in the gallbladder lumen may be related to sludge. No intrahepatic or extrahepatic biliary dilation. Pancreas: Interval decrease in size of the pancreatic head mass. Lesion is poorly defined on today's noncontrast study but measures approximately 4.1 x 4.0 cm compared to 6.0 x 5.3 cm previously. There is some trace gas in the region of the mass lesion today which could represent necrosis although superinfection cannot be  excluded. Spleen: No splenomegaly. No focal mass lesion. Adrenals/Urinary Tract: No adrenal nodule or mass. Kidneys unremarkable. No evidence for hydroureter. Bladder is decompressed. Stomach/Bowel: Stomach is markedly distended with fluid. Duodenum is decompressed and small bowel loops are diffusely decompressed throughout. The terminal ileum is normal. The appendix is normal. Colon is decompressed with mild diverticular disease on the left. Vascular/Lymphatic: There is mild atherosclerotic calcification of the abdominal aorta without aneurysm. There is no gastrohepatic or hepatoduodenal ligament lymphadenopathy. No retroperitoneal or mesenteric lymphadenopathy. No pelvic sidewall lymphadenopathy. Reproductive: IUD is visualized in the uterus. There is no adnexal mass. Other: No intraperitoneal free fluid. Musculoskeletal: Small umbilical hernia contains only fat. No worrisome lytic or sclerotic osseous abnormality. IMPRESSION: 1. Interval decrease in size of the pancreatic head mass, poorly demonstrated on today's noncontrast CT. Trace gas in the region of the mass lesion today could represent necrosis although superinfection cannot be excluded. 2. Marked distention of the stomach with fluid. Duodenum is decompressed and small bowel loops are diffusely decompressed throughout. Imaging features are compatible with gastric outlet obstruction, likely secondary to involvement by the pancreatic head lesion. 3. Interval development of patchy airspace disease in the lingula and left lower lobe. Imaging features are compatible with infection/inflammation. 4. Ground-glass opacity in the posterior right lower lobe has become more confluent and organized in the interval with a masslike character today measuring 2.2 x 1.5 cm. While likely infectious/inflammatory, close follow-up recommended. 5. Hepatic steatosis. 6. Small umbilical hernia contains only fat. 7. Aortic Atherosclerosis (ICD10-I70.0). Electronically Signed   By:  Misty Stanley M.D.   On: 01/07/2022 06:55   DG Chest Port 1 View  Result Date: 01/07/2022 CLINICAL DATA:  Possible sepsis. EXAM: PORTABLE CHEST 1 VIEW COMPARISON:  01/01/2022 FINDINGS: 0605 hours low lung volumes. The cardio pericardial silhouette is enlarged. There is pulmonary vascular congestion without overt pulmonary edema. No evidence for pleural effusion. Prominent gastric bubble noted under the left hemidiaphragm. Left permanent pacemaker noted. Telemetry leads overlie the chest. IMPRESSION: Low volume chest x-ray with pulmonary vascular congestion. Electronically Signed   By: Misty Stanley M.D.   On: 01/07/2022 06:33    Anti-infectives: Anti-infectives (From admission, onward)    Start     Dose/Rate Route Frequency Ordered Stop   01/07/22 2000  ceFEPIme (MAXIPIME) 2 g in sodium chloride 0.9 % 100 mL IVPB        2 g 200 mL/hr over 30 Minutes Intravenous Every 12 hours 01/07/22 0935     01/07/22 1800  metroNIDAZOLE (FLAGYL) IVPB 500 mg        500 mg 100 mL/hr over 60 Minutes Intravenous Every 12 hours 01/07/22 1159 01/14/22 1759   01/07/22 0645  ceFEPIme (MAXIPIME) 2 g in sodium chloride 0.9 % 100 mL IVPB        2 g 200 mL/hr over 30 Minutes Intravenous  Once 01/07/22 0641 01/07/22 0822   01/07/22 0645  metroNIDAZOLE (FLAGYL) IVPB 500 mg  500 mg 100 mL/hr over 60 Minutes Intravenous  Once 01/07/22 9507 01/07/22 0757        Assessment/Plan Gastric Outlet Obstruction 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 Burr Medico, note reviewed. Sounds like patient is not a candidate for more cytotoxic chemotherapy and goal of care at this point is palliative and comfort. Dr. Burr Medico thinks she is a poor candidate for Galien bypass surgery. GI planning for EGD and possible duodenal stent placement today. Discussed if this was unsuccessful we would recommend venting PEG tube and could do this while she remained in Endoscopy. Explained this  would not be for nutrition and only for venting. We discussed indication, procedure and risks. Patient in agreement with proceeding. We will be on standby incase GI is unable to place duodenal stent. We have been in contact with GI about this as well.    FEN - NPO, NGT, IVF VTE - SCDs, heparin gtt on hold for procedure  ID - Cefepime/Flagyl Foley - I/O overnight.    Possible PNA vs superimposed infection of pancreas - On abx.  AKI CHB s/p pacemaker DM2 GERD HTN HLD  A. Fib on Eliquis (last dose Monday 1/30)  This care required moderate level of medical decision making.    LOS: 1 day    Jillyn Ledger , [facility] 01/08/2022, 9:54 AM Please see Amion for pager number during day hours 7:00am-4:30pm

## 2022-01-09 ENCOUNTER — Encounter (HOSPITAL_COMMUNITY): Payer: Self-pay | Admitting: Gastroenterology

## 2022-01-09 ENCOUNTER — Inpatient Hospital Stay: Payer: 59 | Admitting: Hematology

## 2022-01-09 ENCOUNTER — Ambulatory Visit (INDEPENDENT_AMBULATORY_CARE_PROVIDER_SITE_OTHER): Payer: 59

## 2022-01-09 ENCOUNTER — Telehealth: Payer: Self-pay | Admitting: *Deleted

## 2022-01-09 ENCOUNTER — Inpatient Hospital Stay: Payer: 59

## 2022-01-09 DIAGNOSIS — I442 Atrioventricular block, complete: Secondary | ICD-10-CM | POA: Diagnosis not present

## 2022-01-09 DIAGNOSIS — K311 Adult hypertrophic pyloric stenosis: Secondary | ICD-10-CM | POA: Diagnosis not present

## 2022-01-09 LAB — CUP PACEART REMOTE DEVICE CHECK
Battery Remaining Longevity: 171 mo
Battery Voltage: 3.1 V
Brady Statistic AP VP Percent: 0.26 %
Brady Statistic AP VS Percent: 0.12 %
Brady Statistic AS VP Percent: 5.65 %
Brady Statistic AS VS Percent: 93.97 %
Brady Statistic RA Percent Paced: 0.29 %
Brady Statistic RV Percent Paced: 5.91 %
Date Time Interrogation Session: 20230203000920
Implantable Lead Implant Date: 20211105
Implantable Lead Implant Date: 20211105
Implantable Lead Location: 753859
Implantable Lead Location: 753860
Implantable Lead Model: 5076
Implantable Lead Model: 5076
Implantable Pulse Generator Implant Date: 20211105
Lead Channel Impedance Value: 247 Ohm
Lead Channel Impedance Value: 266 Ohm
Lead Channel Impedance Value: 304 Ohm
Lead Channel Impedance Value: 323 Ohm
Lead Channel Pacing Threshold Amplitude: 0.625 V
Lead Channel Pacing Threshold Amplitude: 1.25 V
Lead Channel Pacing Threshold Pulse Width: 0.4 ms
Lead Channel Pacing Threshold Pulse Width: 0.4 ms
Lead Channel Sensing Intrinsic Amplitude: 1.75 mV
Lead Channel Sensing Intrinsic Amplitude: 1.75 mV
Lead Channel Sensing Intrinsic Amplitude: 2.875 mV
Lead Channel Sensing Intrinsic Amplitude: 2.875 mV
Lead Channel Setting Pacing Amplitude: 1.5 V
Lead Channel Setting Pacing Amplitude: 2.75 V
Lead Channel Setting Pacing Pulse Width: 0.4 ms
Lead Channel Setting Sensing Sensitivity: 1.2 mV

## 2022-01-09 LAB — HEPARIN LEVEL (UNFRACTIONATED): Heparin Unfractionated: 1.09 IU/mL — ABNORMAL HIGH (ref 0.30–0.70)

## 2022-01-09 LAB — BASIC METABOLIC PANEL
Anion gap: 6 (ref 5–15)
BUN: 30 mg/dL — ABNORMAL HIGH (ref 6–20)
CO2: 36 mmol/L — ABNORMAL HIGH (ref 22–32)
Calcium: 7 mg/dL — ABNORMAL LOW (ref 8.9–10.3)
Chloride: 97 mmol/L — ABNORMAL LOW (ref 98–111)
Creatinine, Ser: 1.52 mg/dL — ABNORMAL HIGH (ref 0.44–1.00)
GFR, Estimated: 39 mL/min — ABNORMAL LOW (ref >60)
Glucose, Bld: 251 mg/dL — ABNORMAL HIGH (ref 70–99)
Potassium: 2.8 mmol/L — ABNORMAL LOW (ref 3.5–5.1)
Sodium: 139 mmol/L (ref 135–145)

## 2022-01-09 LAB — CBC
HCT: 20 % — ABNORMAL LOW (ref 36.0–46.0)
Hemoglobin: 6.1 g/dL — CL (ref 12.0–15.0)
MCH: 28.9 pg (ref 26.0–34.0)
MCHC: 30.5 g/dL (ref 30.0–36.0)
MCV: 94.8 fL (ref 80.0–100.0)
Platelets: 177 10*3/uL (ref 150–400)
RBC: 2.11 MIL/uL — ABNORMAL LOW (ref 3.87–5.11)
RDW: 19.4 % — ABNORMAL HIGH (ref 11.5–15.5)
WBC: 10.9 10*3/uL — ABNORMAL HIGH (ref 4.0–10.5)
nRBC: 1.4 % — ABNORMAL HIGH (ref 0.0–0.2)

## 2022-01-09 LAB — HEMOGLOBIN AND HEMATOCRIT, BLOOD
HCT: 24.3 % — ABNORMAL LOW (ref 36.0–46.0)
Hemoglobin: 7.8 g/dL — ABNORMAL LOW (ref 12.0–15.0)

## 2022-01-09 LAB — URINE CULTURE: Culture: NO GROWTH

## 2022-01-09 LAB — GLUCOSE, CAPILLARY
Glucose-Capillary: 166 mg/dL — ABNORMAL HIGH (ref 70–99)
Glucose-Capillary: 232 mg/dL — ABNORMAL HIGH (ref 70–99)
Glucose-Capillary: 236 mg/dL — ABNORMAL HIGH (ref 70–99)
Glucose-Capillary: 238 mg/dL — ABNORMAL HIGH (ref 70–99)
Glucose-Capillary: 243 mg/dL — ABNORMAL HIGH (ref 70–99)
Glucose-Capillary: 254 mg/dL — ABNORMAL HIGH (ref 70–99)

## 2022-01-09 LAB — MAGNESIUM: Magnesium: 2 mg/dL (ref 1.7–2.4)

## 2022-01-09 LAB — PHOSPHORUS: Phosphorus: 3.2 mg/dL (ref 2.5–4.6)

## 2022-01-09 LAB — PREPARE RBC (CROSSMATCH)

## 2022-01-09 LAB — APTT
aPTT: 93 seconds — ABNORMAL HIGH (ref 24–36)
aPTT: 106 seconds — ABNORMAL HIGH (ref 24–36)

## 2022-01-09 MED ORDER — SODIUM CHLORIDE 0.9% IV SOLUTION: Freq: Once | INTRAVENOUS | Status: DC

## 2022-01-09 MED ORDER — POTASSIUM CHLORIDE 10 MEQ/100ML IV SOLN
10.0000 meq | INTRAVENOUS | Status: AC
  Administered 2022-01-09 (×4): 10 meq via INTRAVENOUS
  Filled 2022-01-09 (×3): qty 100

## 2022-01-09 MED ORDER — HEPARIN (PORCINE) 25000 UT/250ML-% IV SOLN: 1550.0000 [IU]/h | INTRAVENOUS | Status: DC

## 2022-01-09 MED ORDER — INSULIN ASPART 100 UNIT/ML IJ SOLN
2.0000 [IU] | Freq: Once | INTRAMUSCULAR | Status: AC
  Administered 2022-01-09: 2 [IU] via SUBCUTANEOUS

## 2022-01-09 MED ORDER — SODIUM CHLORIDE 0.9% IV SOLUTION: Freq: Once | INTRAVENOUS | Status: AC

## 2022-01-09 MED ORDER — HEPARIN (PORCINE) 25000 UT/250ML-% IV SOLN
1550.0000 [IU]/h | INTRAVENOUS | Status: DC
  Filled 2022-01-09: qty 250

## 2022-01-09 MED ORDER — POTASSIUM CHLORIDE 20 MEQ PO PACK: 40.0000 meq | PACK | Freq: Once | ORAL | Status: DC

## 2022-01-09 MED ORDER — HEPARIN (PORCINE) 25000 UT/250ML-% IV SOLN
1350.0000 [IU]/h | INTRAVENOUS | Status: AC
  Administered 2022-01-09 – 2022-01-10 (×2): 1400 [IU]/h via INTRAVENOUS
  Administered 2022-01-10 – 2022-01-11 (×2): 1350 [IU]/h via INTRAVENOUS
  Filled 2022-01-09 (×5): qty 250

## 2022-01-09 NOTE — Telephone Encounter (Signed)
"  Karli Wickizer daughter [name] [phone]) calling to request a copy of FMLA form (completed and returned via mail 12/11/2021 per tracker) from my mom's chart.  My employer has a copy.  I know it was mailed but we no longer have it."   Advised mom will need to sign authorization for release for ([place] [employer] who has a thirty-day time-frame allowed to process release of medical records.    "We'll find what was mailed." "She needs a letter for disability with her diagnosis and prognosis from Dr. Burr Medico.  No disability form required.  Letter only for 'Compassionate [employer]' so we do not have to go to court."  Call transferred at [phone] to extension [phone] for best help with letter needed.

## 2022-01-09 NOTE — Progress Notes (Signed)
Pt with no urine output for 24~ hours. Bladder scan pt but getting false readings. NP Olena Heckle made aware. In and out pt and got 650 mL urine output.

## 2022-01-09 NOTE — Progress Notes (Signed)
PROGRESS NOTE    Leslie Mullen  ZOX:[id number] DOB: 02-Jul-1961 DOA: 01/07/2022 PCP: Sue Lush, PA-C    Brief Narrative: This 61 years old female with PMH significant for CHB, s/p PPM, DM 2, hypertension, metastatic pancreatic cancer, endometrial cancer, morbid obesity presented in the ED with complaints of nausea and vomiting.  She was recently hospitalized from 1/26 - 1/30 for sepsis secondary to port infection.  At that time she was sent home with 5 days of oral doxycycline.  Blood cultures were negative.  Patient has acutely developed nausea and vomiting,  She was unable to take anything down she has tried Compazine and antacids did not help.  She was feeling dizzy by end of day with worsening nausea and vomiting. Patient is admitted for sepsis secondary to pancreatic infection versus left lower lobe pneumonia.  She is also found to have gastric outlet obstruction, general surgery, gastroenterology, oncology, palliative care consulted.  Assessment & Plan:   Principal Problem:   Gastric outlet obstruction Active Problems:   Type 2 diabetes mellitus with diabetic polyneuropathy, with long-term current use of insulin (HCC)   Paroxysmal atrial fibrillation (HCC)   AKI (acute kidney injury) ([place])   Pancreatic cancer metastasized to liver (HCC)   Sepsis (HCC)  Sepsis sec. to pancreatic infection versus left lower lobe pneumonia: Continue IV antibiotics (cefepime,  Flagyl) Continue IV fluids.  Continue supportive care.  Lactic acid normalized with IV hydration, back to 2.1 again. Procalcitonin 0.67>0.26.  Blood cultures no growth to date. Sepsis physiology improving.  Gastric outlet obstruction: Pancreatic cancer with mets to the liver: Patient with metastatic pancreatic cancer followed by Dr. Annamaria Boots on palliative chemotherapy presented with nausea, vomiting and found to have gastric outlet obstruction secondary to pancreatic head lesion.  Continued NG tube decompression.  She was kept  NPO. Patient had poor prognosis and poor tolerance to chemotherapy.  She is not a candidate for more cytotoxic therapy.  Goals of care at this point is palliative and comfort. GI consulted, EGD attempted with possible stent placement.  She feels improved.  NG tube removed. Patient is now tolerating clear liquid diet, without nausea and vomiting. GI recommended multiple small meals, plenty of fluids, antireflux measures. Palliative care consulted.  Patient is considering hospice services.  Wishes to discuss further with her family.  Diabetes mellitus type 2: Continue sliding scale.  AKI: > Improving Could be due to dehydration, Dec. PO intake. Continue IV hydration.  Serum creatinine improving. Avoid nephrotoxic medications CT abdomen without obstruction.  Essential hypertension: Hydralazine as needed. Resume home medication once patient is able to take p.o.  Paroxysmal A-fib: Heart rate controlled.  Continue heparin GTT.  Hypokalemia: Replaced.  Continue to monitor.  Normochromic normocytic anemia: Her hemoglobin has dropped from 8.0-7.4>6.1 Transfuse 1 PRBC, posttrans Hb 7.8 Continue to monitor H&H.  DVT prophylaxis: Heparin gtt Code Status: Full code. Family Communication: No family at bed side. Disposition Plan:   Status is: Inpatient Remains inpatient appropriate because: Admitted for sepsis and gastric outlet obstruction in the setting of pancreatic metastatic carcinoma.  General surgery GI and oncology consulted.  Patient underwent EGD with stenting.  Tolerated well,  She is now tolerating clear liquid diet.  Consultants:  Oncology General surgery Gastroenterology Palliative care  Procedures: Gastrostomy tube  antimicrobials:   Anti-infectives (From admission, onward)    Start     Dose/Rate Route Frequency Ordered Stop   01/07/22 2000  ceFEPIme (MAXIPIME) 2 g in sodium chloride 0.9 % 100 mL IVPB  2 g 200 mL/hr over 30 Minutes Intravenous Every 12 hours  01/07/22 0935     01/07/22 1800  metroNIDAZOLE (FLAGYL) IVPB 500 mg        500 mg 100 mL/hr over 60 Minutes Intravenous Every 12 hours 01/07/22 1159 01/14/22 1759   01/07/22 0645  ceFEPIme (MAXIPIME) 2 g in sodium chloride 0.9 % 100 mL IVPB        2 g 200 mL/hr over 30 Minutes Intravenous  Once 01/07/22 0641 01/07/22 0822   01/07/22 0645  metroNIDAZOLE (FLAGYL) IVPB 500 mg        500 mg 100 mL/hr over 60 Minutes Intravenous  Once 01/07/22 0641 01/07/22 0757        Subjective: Patient was seen and examined at bedside.  Overnight events noted.   Patient reports feeling better.  She underwent EGD with stenting yesterday tolerated well. She denies any nausea, vomiting, in fact she is tolerating clear liquid diet now.   She is able to pass flatus.  Objective: Vitals:   01/09/22 0326 01/09/22 0615 01/09/22 1115 01/09/22 1145  BP: (!) 123/56 138/70 127/62 119/63  Pulse: 78 78 85 82  Resp: (!) 21 17 16 16   Temp: 98.3 F (36.8 C) 98.2 F (36.8 C) 97.7 F (36.5 C) 98.2 F (36.8 C)  TempSrc: Oral Oral  Oral  SpO2: 99% 99% 100%   Weight:      Height:        Intake/Output Summary (Last 24 hours) at 01/09/2022 1407 Last data filed at 01/09/2022 0900 Gross per 24 hour  Intake 2531.95 ml  Output 650 ml  Net 1881.95 ml   Filed Weights   01/07/22 0500 01/07/22 0502 01/08/22 1229  Weight: (!) 137.8 kg 136.1 kg 136 kg    Examination:  General exam: Appears chronically ill looking, deconditioned, not in any distress. Respiratory system: Clear to auscultation bilaterally. Respiratory effort normal.  RR 16 Cardiovascular system: S1 & S2 heard, regular rate and rhythm, no murmur. Gastrointestinal system: Abdomen is soft, nondistended, nontender,  bowel sounds +. Central nervous system: Alert and oriented x 3. No focal neurological deficits. Extremities: Bilateral pedal edema, no cyanosis no clubbing. Skin: No rashes, lesions or ulcers Psychiatry: Judgement and insight appear normal.  Mood & affect appropriate.     Data Reviewed: I have personally reviewed following labs and imaging studies  CBC: Recent Labs  Lab 01/03/22 0301 01/04/22 0414 01/05/22 0104 01/07/22 0556 01/08/22 0457 01/08/22 0816 01/09/22 0043 01/09/22 0812  WBC 9.5 5.1 6.7 15.3* 15.2*  --  10.9*  --   NEUTROABS 7.7 3.1 4.3  --   --   --   --   --   HGB 7.3* 8.3* 8.0* 10.6* 7.2* 7.4* 6.1* 7.8*  HCT 23.6* 25.9* 25.3* 33.1* 22.3* 22.7* 20.0* 24.3*  MCV 91.1 90.9 91.7 90.2 91.4  --  94.8  --   PLT 208 180 184 294 204  --  177  --    Basic Metabolic Panel: Recent Labs  Lab 01/03/22 0301 01/04/22 0414 01/05/22 0104 01/07/22 0558 01/08/22 0457 01/09/22 0043  NA 134* 138 139 141 142 139  K 4.3 3.9 3.8 3.8 2.9* 2.8*  CL 105 111 106 93* 95* 97*  CO2 24 22 24 25  35* 36*  GLUCOSE 196* 178* 175* 376* 215* 251*  BUN 17 13 11  24* 33* 30*  CREATININE 1.45* 1.28* 1.20* 1.69* 1.90* 1.52*  CALCIUM 6.9* 7.1* 7.7* 8.9 7.7* 7.0*  MG 1.8 1.9 2.0  --   --  2.0  PHOS  --   --   --   --   --  3.2   GFR: Estimated Creatinine Clearance: 58.3 mL/min (A) (by C-G formula based on SCr of 1.52 mg/dL (H)). Liver Function Tests: Recent Labs  Lab 01/03/22 0301 01/05/22 0104 01/07/22 0558 01/08/22 0457  AST 16 17 17  14*  ALT 9 10 13 10   ALKPHOS 80 82 86 57  BILITOT 0.2* 0.2* 0.8 0.9  PROT 5.1* 6.0* 8.2* 5.4*  ALBUMIN <1.5* <1.5* 2.3* 1.9*   Recent Labs  Lab 01/07/22 0558  LIPASE 43   No results for input(s): AMMONIA in the last 168 hours. Coagulation Profile: Recent Labs  Lab 01/07/22 0558 01/08/22 0457  INR 1.6* 1.7*   Cardiac Enzymes: No results for input(s): CKTOTAL, CKMB, CKMBINDEX, TROPONINI in the last 168 hours.  BNP (last 3 results) No results for input(s): PROBNP in the last 8760 hours. HbA1C: Recent Labs    01/07/22 0559  HGBA1C 6.7*   CBG: Recent Labs  Lab 01/08/22 2020 01/09/22 0012 01/09/22 0442 01/09/22 0727 01/09/22 1113  GLUCAP 270* 254* 236* 243* 232*    Lipid Profile: No results for input(s): CHOL, HDL, LDLCALC, TRIG, CHOLHDL, LDLDIRECT in the last 72 hours. Thyroid Function Tests: No results for input(s): TSH, T4TOTAL, FREET4, T3FREE, THYROIDAB in the last 72 hours. Anemia Panel: No results for input(s): VITAMINB12, FOLATE, FERRITIN, TIBC, IRON, RETICCTPCT in the last 72 hours. Sepsis Labs: Recent Labs  Lab 01/07/22 0556 01/07/22 0838 01/07/22 1240 01/07/22 1717 01/08/22 0457  PROCALCITON  --   --   --   --  0.26  LATICACIDVEN 2.3* 2.5* 1.6 2.1*  --     Recent Results (from the past 240 hour(s))  Resp Panel by RT-PCR (Flu A&B, Covid) Nasopharyngeal Swab     Status: None   Collection Time: 01/01/22  8:16 AM   Specimen: Nasopharyngeal Swab; Nasopharyngeal(NP) swabs in vial transport medium  Result Value Ref Range Status   SARS Coronavirus 2 by RT PCR NEGATIVE NEGATIVE Final    Comment: (NOTE) SARS-CoV-2 target nucleic acids are NOT DETECTED.  The SARS-CoV-2 RNA is generally detectable in upper respiratory specimens during the acute phase of infection. The lowest concentration of SARS-CoV-2 viral copies this assay can detect is 138 copies/mL. A negative result does not preclude SARS-Cov-2 infection and should not be used as the sole basis for treatment or other patient management decisions. A negative result may occur with  improper specimen collection/handling, submission of specimen other than nasopharyngeal swab, presence of viral mutation(s) within the areas targeted by this assay, and inadequate number of viral copies(<138 copies/mL). A negative result must be combined with clinical observations, patient history, and epidemiological information. The expected result is Negative.  Fact Sheet for Patients:  EntrepreneurPulse.com.au  Fact Sheet for Healthcare Providers:  IncredibleEmployment.be  This test is no t yet approved or cleared by the [place] FDA and  has been  authorized for detection and/or diagnosis of SARS-CoV-2 by FDA under an Emergency Use Authorization (EUA). This EUA will remain  in effect (meaning this test can be used) for the duration of the COVID-19 declaration under Section 564(b)(1) of the Act, 21 U.S.C.section 360bbb-3(b)(1), unless the authorization is terminated  or revoked sooner.       Influenza A by PCR NEGATIVE NEGATIVE Final   Influenza B by PCR NEGATIVE NEGATIVE Final    Comment: (NOTE) The Xpert Xpress SARS-CoV-2/FLU/RSV plus assay is intended as an aid in the diagnosis of  influenza from Nasopharyngeal swab specimens and should not be used as a sole basis for treatment. Nasal washings and aspirates are unacceptable for Xpert Xpress SARS-CoV-2/FLU/RSV testing.  Fact Sheet for Patients: EntrepreneurPulse.com.au  Fact Sheet for Healthcare Providers: IncredibleEmployment.be  This test is not yet approved or cleared by the [place] FDA and has been authorized for detection and/or diagnosis of SARS-CoV-2 by FDA under an Emergency Use Authorization (EUA). This EUA will remain in effect (meaning this test can be used) for the duration of the COVID-19 declaration under Section 564(b)(1) of the Act, 21 U.S.C. section 360bbb-3(b)(1), unless the authorization is terminated or revoked.  Performed at [facility], [place] [address] [place], [place] [address]   Urine Culture     Status: Abnormal   Collection Time: 01/01/22  8:43 AM   Specimen: Urine, Clean Catch  Result Value Ref Range Status   Specimen Description URINE, CLEAN CATCH  Final   Special Requests   Final    NONE Performed at [facility], [place] [address], [place], [place] [address]    Culture (A)  Final    >=100,000 COLONIES/mL MULTIPLE SPECIES PRESENT, SUGGEST RECOLLECTION   Report Status 01/02/2022 FINAL  Final  Culture, blood (routine x 2)     Status: None   Collection Time: 01/01/22 11:52 AM    Specimen: BLOOD  Result Value Ref Range Status   Specimen Description BLOOD BLOOD RIGHT ARM  Final   Special Requests   Final    BOTTLES DRAWN AEROBIC AND ANAEROBIC Blood Culture results may not be optimal due to an inadequate volume of blood received in culture bottles   Culture   Final    NO GROWTH 5 DAYS Performed at [facility], [place] [address] [place], [place] [address]    Report Status 01/06/2022 FINAL  Final  Culture, blood (routine x 2)     Status: None   Collection Time: 01/02/22  1:27 AM   Specimen: BLOOD  Result Value Ref Range Status   Specimen Description BLOOD SITE NOT SPECIFIED  Final   Special Requests   Final    AEROBIC BOTTLE ONLY Blood Culture results may not be optimal due to an inadequate volume of blood received in culture bottles   Culture   Final    NO GROWTH 5 DAYS Performed at [facility], [place] [address], [place], [place] [address]    Report Status 01/07/2022 FINAL  Final  Blood Culture (routine x 2)     Status: None (Preliminary result)   Collection Time: 01/07/22  5:59 AM   Specimen: Site Not Specified; Blood  Result Value Ref Range Status   Specimen Description   Final    SITE NOT SPECIFIED Performed at [place] [address], [place], [place] [address]    Special Requests   Final    BOTTLES DRAWN AEROBIC AND ANAEROBIC Blood Culture adequate volume Performed at [place] [address], [place], [place] [address]    Culture   Final    NO GROWTH 2 DAYS Performed at [place] [address], [place], [place] [address]    Report Status PENDING  Incomplete  Urine Culture     Status: None   Collection Time: 01/07/22  5:59 AM   Specimen: In/Out Cath Urine  Result Value Ref Range Status   Specimen Description   Final    IN/OUT CATH URINE Performed at [facility], [place] [address] [place], [place] [address]    Special Requests   Final    NONE Performed at  [facility], [place] [address] [place], [place] [address]    Culture   Final    NO GROWTH Performed at [facility], [place] [address], [place], [place] [address]    Report Status 01/09/2022 FINAL  Final  Resp Panel by RT-PCR (Flu A&B, Covid) Nasopharyngeal Swab     Status: None   Collection Time: 01/07/22  8:38 AM   Specimen: Nasopharyngeal Swab; Nasopharyngeal(NP) swabs in vial transport medium  Result Value Ref Range Status   SARS Coronavirus 2 by RT PCR NEGATIVE NEGATIVE Final    Comment: (NOTE) SARS-CoV-2 target nucleic acids are NOT DETECTED.  The SARS-CoV-2 RNA is generally detectable in upper respiratory specimens during the acute phase of infection. The lowest concentration of SARS-CoV-2 viral copies this assay can detect is 138 copies/mL. A negative result does not preclude SARS-Cov-2 infection and should not be used as the sole basis for treatment or other patient management decisions. A negative result may occur with  improper specimen collection/handling, submission of specimen other than nasopharyngeal swab, presence of viral mutation(s) within the areas targeted by this assay, and inadequate number of viral copies(<138 copies/mL). A negative result must be combined with clinical observations, patient history, and epidemiological information. The expected result is Negative.  Fact Sheet for Patients:  EntrepreneurPulse.com.au  Fact Sheet for Healthcare Providers:  IncredibleEmployment.be  This test is no t yet approved or cleared by the [place] FDA and  has been authorized for detection and/or diagnosis of SARS-CoV-2 by FDA under an Emergency Use Authorization (EUA). This EUA will remain  in effect (meaning this test can be used) for the duration of the COVID-19 declaration under Section 564(b)(1) of the Act, 21 U.S.C.section 360bbb-3(b)(1), unless the authorization is terminated  or revoked sooner.        Influenza A by PCR NEGATIVE NEGATIVE Final   Influenza B by PCR NEGATIVE NEGATIVE Final    Comment: (NOTE) The Xpert Xpress SARS-CoV-2/FLU/RSV plus assay is intended as an aid in the diagnosis of influenza from Nasopharyngeal swab specimens and should not be used as a sole basis for treatment. Nasal washings and aspirates are unacceptable for Xpert Xpress SARS-CoV-2/FLU/RSV testing.  Fact Sheet for Patients: EntrepreneurPulse.com.au  Fact Sheet for Healthcare Providers: IncredibleEmployment.be  This test is not yet approved or cleared by the [place] FDA and has been authorized for detection and/or diagnosis of SARS-CoV-2 by FDA under an Emergency Use Authorization (EUA). This EUA will remain in effect (meaning this test can be used) for the duration of the COVID-19 declaration under Section 564(b)(1) of the Act, 21 U.S.C. section 360bbb-3(b)(1), unless the authorization is terminated or revoked.  Performed at [facility], [place] [address] [place], [place] [address]   Blood Culture (routine x 2)     Status: None (Preliminary result)   Collection Time: 01/07/22  5:17 PM   Specimen: BLOOD  Result Value Ref Range Status   Specimen Description   Final    BLOOD RIGHT ANTECUBITAL Performed at [place] [address], [place], [place] [address]    Special Requests   Final    BOTTLES DRAWN AEROBIC ONLY Blood Culture results may not be optimal due to an inadequate volume of blood received in culture bottles Performed at [place] [address], [place], [place] [address]  Culture   Final    NO GROWTH 2 DAYS Performed at [facility], [place] [address], [place], [place] [address]    Report Status PENDING  Incomplete         Radiology Studies: DG C-Arm 1-60 Min  Result Date: 01/08/2022 CLINICAL DATA:  History of metastatic pancreatic cancer now with  duodenal obstruction. EXAM: ERCP TECHNIQUE: Multiple spot images obtained with the fluoroscopic device and submitted for interpretation post-procedure. FLUOROSCOPY TIME: FLUOROSCOPY TIME 1 minute, 7 seconds (29.3 mGy) COMPARISON:  CT abdomen and pelvis-01/07/2022 FINDINGS: Four spot intraoperative fluoroscopic images of the right upper abdominal quadrant during ERCP are provided for review. Initial image demonstrates an ERCP probe overlying the right upper abdominal quadrant. Subsequent images demonstrate placement of a enteric stent which appears to traverse the expected location of the gastric antrum, duodenal bulb and proximal aspect of the descending duodenum. IMPRESSION: Post gastric outlet and proximal duodenal enteric stent placement. These images were submitted for radiologic interpretation only. Please see the procedural report for the amount of contrast and the fluoroscopy time utilized. Electronically Signed   By: Sandi Mariscal M.D.   On: 01/08/2022 14:05   CUP PACEART REMOTE DEVICE CHECK  Result Date: 01/09/2022 Scheduled remote reviewed. Normal device function.  7 NSVT, 5 fast AV.  All EGM's show regular 1:1, rates 154-182, longest duration 71min 50sec Overall controlled ventricular rates Decrease in activity noted Next remote 91 days. LA   Scheduled Meds:  sodium chloride   Intravenous Once   insulin aspart  0-20 Units Subcutaneous TID WC   insulin aspart  0-5 Units Subcutaneous QHS   pantoprazole (PROTONIX) IV  40 mg Intravenous Q12H   potassium chloride  40 mEq Oral Once   Continuous Infusions:  sodium chloride 100 mL/hr at 01/08/22 1620   sodium chloride Stopped (01/08/22 1220)   ceFEPime (MAXIPIME) IV 2 g (01/09/22 0845)   heparin 1,550 Units/hr (01/09/22 0936)   metronidazole 500 mg (01/09/22 0354)   promethazine (PHENERGAN) injection (IM or IVPB)       LOS: 2 days    Time spent: 50 mins    Keita Demarco, MD [facility]   If 7PM-7AM, please contact  night-coverage

## 2022-01-09 NOTE — Progress Notes (Signed)
ANTICOAGULATION CONSULT NOTE - Follow Up Consult  Pharmacy Consult for heparin Indication: atrial fibrillation- bridge therapy while PTA apixaban held  Allergies  Allergen Reactions   Penicillins Shortness Of Breath, Itching and Swelling    Has patient had a PCN reaction causing immediate rash, facial/tongue/throat swelling, SOB or lightheadedness with hypotension: Yes Has patient had a PCN reaction causing severe rash involving mucus membranes or skin necrosis:No Has patient had a PCN reaction that required hospitalization: No Has patient had a PCN reaction occurring within the last 10 years: no If all of the above answers are "NO", then may proceed with Cephalosporin use.  *Per patient, tolerated Keflex in the past      Patient Measurements: Height: 5\' 8"  (172.7 cm) Weight: 136 kg (299 lb 13.2 oz) IBW/kg (Calculated) : 63.9 Heparin Dosing Weight: 96.7 kg  Vital Signs: Temp: 98.2 F (36.8 C) (02/03 1145) Temp Source: Oral (02/03 1145) BP: 119/63 (02/03 1145) Pulse Rate: 82 (02/03 1145)  Labs: Recent Labs     0000 01/07/22 0556 01/07/22 0558 01/07/22 1610 01/07/22 0941 01/07/22 1843 01/08/22 0457 01/08/22 0816 01/09/22 0043 01/09/22 0812 01/09/22 1653  HGB  --  10.6*  --   --   --   --  7.2* 7.4* 6.1* 7.8*  --   HCT  --  33.1*  --   --   --   --  22.3* 22.7* 20.0* 24.3*  --   PLT  --  294  --   --   --   --  204  --  177  --   --   APTT   < >  --  24  --   --  61* 93*  --  93*  --  106*  LABPROT  --   --  18.7*  --   --   --  19.8*  --   --   --   --   INR  --   --  1.6*  --   --   --  1.7*  --   --   --   --   HEPARINUNFRC  --   --   --   --    < > >1.10* >1.10*  --  1.09*  --   --   CREATININE  --   --  1.69*  --   --   --  1.90*  --  1.52*  --   --   TROPONINIHS  --   --  16 13  --   --   --   --   --   --   --    < > = values in this interval not displayed.     Estimated Creatinine Clearance: 58.3 mL/min (A) (by C-G formula based on SCr of 1.52 mg/dL  (H)).   Medical History: Past Medical History:  Diagnosis Date   Abnormal uterine bleeding    Allergy    Anemia    Asthma    Cataract    BOTH EYES   Complete heart block ([place])    a. Medtr[employer] PPM 10/2020.   Diabetes ([place])    type II    Diabetic neuropathy ([place])    Endometrial cancer ([place])    Family history of colon cancer    GERD (gastroesophageal reflux disease)    Hyperlipidemia    Hypertension    Morbid obesity ([place])    Osteoarthritis    right knee    Pancreatitis  Pneumonia     Assessment: 61 yo F on apixaban 5 mg po BID PTA for PAF. She was just [facility] from [facility].  Pharmacy to dose heparin for bridge therapy while apixaban held for NPO/GOO.  Last dose apixaban 5 mg was 1/30 at 9 am (in hospital- pt reports she took no meds after DC home).   Today, 01/09/2022 aPTT 106 seconds on heparin 1550 units/hr, supratherapeutic CBC: Hg 6.1 > 7.8 s/p 1 unit PRBCs; Plts WNL but trending down Per RN, no visible bleeding noted  Goal of Therapy:  Heparin level 0.3-0.7 units/ml aPTT 66-102 seconds Monitor platelets by anticoagulation protocol: Yes   Plan:  Decrease heparin infusion to 1400 units/hr Check aPTT in 8hr Using aPTT for heparin dose titration/monitoring until effects of Apixaban on heparin level diminished Check aPTT, heparin level, and CBC while on heparin Monitor closely for s/sx of bleeding  Peggyann Juba, PharmD, BCPS Pharmacy: [phone] 01/09/2022 6:13 PM

## 2022-01-09 NOTE — Plan of Care (Signed)

## 2022-01-09 NOTE — Progress Notes (Signed)
Patient post PRBC H&H resulted with hgb of 7.8.  Will hold 2nd unit of PRBC for now per verbal order from Dr. Dwyane Dee.

## 2022-01-09 NOTE — Progress Notes (Signed)
[facility] Gastroenterology Progress Note  Leslie Mullen 61 y.o. Sep 01, 1961  CC: Gastric outlet obstruction   Subjective: Patient states she is still having some mild epigastric pain today. Denies nausea/vomiting. Has not had a stool.  ROS : Review of Systems  Gastrointestinal:  Positive for abdominal pain. Negative for blood in stool, constipation, diarrhea, heartburn, melena, nausea and vomiting.  Genitourinary:  Negative for dysuria and urgency.     Objective: Vital signs in last 24 hours: Vitals:   01/09/22 0326 01/09/22 0615  BP: (!) 123/56 138/70  Pulse: 78 78  Resp: (!) 21 17  Temp: 98.3 F (36.8 C) 98.2 F (36.8 C)  SpO2: 99% 99%    Physical Exam:  General:  Alert, cooperative, no distress, appears stated age  Head:  Normocephalic, without obvious abnormality, atraumatic  Eyes:  Anicteric sclera, EOM's intact, conjunctival pallor  Lungs:   Clear to auscultation bilaterally, respirations unlabored  Heart:  Regular rate and rhythm, S1, S2 normal  Abdomen:   Soft, mild epigastric tenderness, bowel sounds active all four quadrants,  no masses,     Lab Results: Recent Labs    01/08/22 0457 01/09/22 0043  NA 142 139  K 2.9* 2.8*  CL 95* 97*  CO2 35* 36*  GLUCOSE 215* 251*  BUN 33* 30*  CREATININE 1.90* 1.52*  CALCIUM 7.7* 7.0*  MG  --  2.0  PHOS  --  3.2   Recent Labs    01/07/22 0558 01/08/22 0457  AST 17 14*  ALT 13 10  ALKPHOS 86 57  BILITOT 0.8 0.9  PROT 8.2* 5.4*  ALBUMIN 2.3* 1.9*   Recent Labs    01/08/22 0457 01/08/22 0816 01/09/22 0043  WBC 15.2*  --  10.9*  HGB 7.2* 7.4* 6.1*  HCT 22.3* 22.7* 20.0*  MCV 91.4  --  94.8  PLT 204  --  177   Recent Labs    01/07/22 0558 01/08/22 0457  LABPROT 18.7* 19.8*  INR 1.6* 1.7*      Assessment Gastric outlet obstruction; likely secondary to pancreatic cancer - EGD 2/2: Moderately severe reflux and erosive esophagitis with no bleeding.  Medium amount of residue in the stomach.  Partially  obstructing nonbleeding duodenal ulcer.  Acquired duodenal stenosis, prosthesis placed.  No specimens collected. - hgb 6.1 - leukocytosis 10.9 (improving) - BUN 30, Cr. 1.52  Plan: Successful EGD with prosthesis placement yesterday. No further nausea/vomiting Recommend continuing Protonix 40mg  IV BID indefinitely Continue daily CBC and transfuse as needed to maintain HGB > 7  Continue supportive care [facility] will sign off. Please contact us if we can be of any further assistance during this hospital stay.   Garnette Scheuermann PA-C 01/09/2022, 8:44 AM  Contact #  [phone]

## 2022-01-09 NOTE — TOC Progression Note (Signed)
Transition of Care [facility]) - Progression Note    Patient Details  Name: Leslie Mullen MRN: [id number] Date of Birth: 06/19/1961  Transition of Care [facility]) CM/SW Contact  Dequon Schnebly, Juliann Pulse, RN Phone Number: 01/09/2022, 4:19 PM  Clinical Narrative: Spoke to patient-she would like Dr. Burr Medico to provide a letter for disability addressing-dx/prognosis/treatment.Will continue to follow for d/c plans.      Expected Discharge Plan: Home/Self Care Barriers to Discharge: Continued Medical Work up  Expected Discharge Plan and Services Expected Discharge Plan: Home/Self Care     Post Acute Care Choice: NA Living arrangements for the past 2 months: Single Family Home                                       Social Determinants of Health (SDOH) Interventions    Readmission Risk Interventions Readmission Risk Prevention Plan 01/08/2022  Transportation Screening Complete  Medication Review [employer]) Complete  PCP or Specialist appointment within 3-5 days of discharge Complete  HRI or [place] Complete  SW Recovery Care/Counseling Consult Complete  [place] Not Applicable  Some recent data might be hidden

## 2022-01-09 NOTE — Progress Notes (Signed)
°   01/09/22 0129  Provider Notification  Provider Name/Title Gershon Cull, NP  Date Provider Notified 01/09/22  Time Provider Notified [phone]  Notification Type Page  Notification Reason Critical result  Test performed and critical result Hgb 6.1  Date Critical Result Received 01/09/22  Time Critical Result Received 0012  Provider response See new orders  Date of Provider Response 01/09/22  Time of Provider Response [phone]

## 2022-01-09 NOTE — Progress Notes (Signed)
ANTICOAGULATION CONSULT NOTE - Follow Up Consult  Pharmacy Consult for heparin Indication: atrial fibrillation- bridge therapy while PTA apixaban held  Allergies  Allergen Reactions   Penicillins Shortness Of Breath, Itching and Swelling    Has patient had a PCN reaction causing immediate rash, facial/tongue/throat swelling, SOB or lightheadedness with hypotension: Yes Has patient had a PCN reaction causing severe rash involving mucus membranes or skin necrosis:No Has patient had a PCN reaction that required hospitalization: No Has patient had a PCN reaction occurring within the last 10 years: no If all of the above answers are "NO", then may proceed with Cephalosporin use.  *Per patient, tolerated Keflex in the past      Patient Measurements: Height: 5\' 8"  (172.7 cm) Weight: 136 kg (299 lb 13.2 oz) IBW/kg (Calculated) : 63.9 Heparin Dosing Weight: 96.7 kg  Vital Signs: Temp: 98.9 F (37.2 C) (02/02 2302) Temp Source: Oral (02/02 2302) BP: 112/62 (02/02 2302) Pulse Rate: 83 (02/02 2302)  Labs: Recent Labs     0000 01/07/22 0556 01/07/22 0558 01/07/22 3299 01/07/22 0941 01/07/22 1843 01/08/22 0457 01/08/22 0816 01/09/22 0043  HGB  --  10.6*  --   --   --   --  7.2* 7.4* 6.1*  HCT  --  33.1*  --   --   --   --  22.3* 22.7* 20.0*  PLT  --  294  --   --   --   --  204  --  177  APTT   < >  --  24  --   --  61* 93*  --  93*  LABPROT  --   --  18.7*  --   --   --  19.8*  --   --   INR  --   --  1.6*  --   --   --  1.7*  --   --   HEPARINUNFRC  --   --   --   --    < > >1.10* >1.10*  --  1.09*  CREATININE  --   --  1.69*  --   --   --  1.90*  --  1.52*  TROPONINIHS  --   --  16 13  --   --   --   --   --    < > = values in this interval not displayed.     Estimated Creatinine Clearance: 58.3 mL/min (A) (by C-G formula based on SCr of 1.52 mg/dL (H)).   Medical History: Past Medical History:  Diagnosis Date   Abnormal uterine bleeding    Allergy    Anemia     Asthma    Cataract    BOTH EYES   Complete heart block ([place])    a. [employer] PPM 10/2020.   Diabetes ([place])    type II    Diabetic neuropathy ([place])    Endometrial cancer ([place])    Family history of colon cancer    GERD (gastroesophageal reflux disease)    Hyperlipidemia    Hypertension    Morbid obesity ([place])    Osteoarthritis    right knee    Pancreatitis    Pneumonia     Assessment: 61 yo F on apixaban 5 mg po BID PTA for PAF. She was [facility].  Pharmacy to dose heparin for bridge therapy while apixaban held for NPO/GOO.  Last dose apixaban 5 mg was 1/30 at 9 am (in hospital- pt  reports she took no meds after DC home).   Today, 01/09/2022 aPTT 93 seconds on heparin 1550 units/hr, therapeutic Heparin level > 1.09 due to effects of apixaban on board CBC- Hg 6.1- 1 unit PRBCs ordered; Plts WNL but trending down No infusion interruptions or bleeding noted by RN  Goal of Therapy:  Heparin level 0.3-0.7 units/ml aPTT 66-102 seconds Monitor platelets by anticoagulation protocol: Yes   Plan:  Continue heparin infusion at 1550 units/hr Using aPTT for heparin dose titration/monitoring until effects of Apixaban on heparin level diminished Daily aPTT, heparin level, and CBC while on heparin Monitor closely for s/sx of bleeding  Netta Cedars, PharmD, BCPS Clinical Pharmacist  01/09/2022 2:00 AM

## 2022-01-10 DIAGNOSIS — K311 Adult hypertrophic pyloric stenosis: Secondary | ICD-10-CM | POA: Diagnosis not present

## 2022-01-10 LAB — GLUCOSE, CAPILLARY
Glucose-Capillary: 147 mg/dL — ABNORMAL HIGH (ref 70–99)
Glucose-Capillary: 163 mg/dL — ABNORMAL HIGH (ref 70–99)
Glucose-Capillary: 172 mg/dL — ABNORMAL HIGH (ref 70–99)
Glucose-Capillary: 174 mg/dL — ABNORMAL HIGH (ref 70–99)
Glucose-Capillary: 180 mg/dL — ABNORMAL HIGH (ref 70–99)
Glucose-Capillary: 183 mg/dL — ABNORMAL HIGH (ref 70–99)

## 2022-01-10 LAB — BASIC METABOLIC PANEL
Anion gap: 10 (ref 5–15)
BUN: 19 mg/dL (ref 6–20)
CO2: 31 mmol/L (ref 22–32)
Calcium: 6.8 mg/dL — ABNORMAL LOW (ref 8.9–10.3)
Chloride: 95 mmol/L — ABNORMAL LOW (ref 98–111)
Creatinine, Ser: 1.16 mg/dL — ABNORMAL HIGH (ref 0.44–1.00)
GFR, Estimated: 54 mL/min — ABNORMAL LOW (ref >60)
Glucose, Bld: 193 mg/dL — ABNORMAL HIGH (ref 70–99)
Potassium: 2.9 mmol/L — ABNORMAL LOW (ref 3.5–5.1)
Sodium: 136 mmol/L (ref 135–145)

## 2022-01-10 LAB — HEPARIN LEVEL (UNFRACTIONATED)
Heparin Unfractionated: 0.72 IU/mL — ABNORMAL HIGH (ref 0.30–0.70)
Heparin Unfractionated: 0.45 IU/mL (ref 0.30–0.70)

## 2022-01-10 LAB — CBC
HCT: 24.5 % — ABNORMAL LOW (ref 36.0–46.0)
Hemoglobin: 7.6 g/dL — ABNORMAL LOW (ref 12.0–15.0)
MCH: 28.1 pg (ref 26.0–34.0)
MCHC: 31 g/dL (ref 30.0–36.0)
MCV: 90.7 fL (ref 80.0–100.0)
Platelets: 194 10*3/uL (ref 150–400)
RBC: 2.7 MIL/uL — ABNORMAL LOW (ref 3.87–5.11)
RDW: 20 % — ABNORMAL HIGH (ref 11.5–15.5)
WBC: 10.4 10*3/uL (ref 4.0–10.5)
nRBC: 0.8 % — ABNORMAL HIGH (ref 0.0–0.2)

## 2022-01-10 LAB — HEMOGLOBIN AND HEMATOCRIT, BLOOD
HCT: 25.9 % — ABNORMAL LOW (ref 36.0–46.0)
Hemoglobin: 8.2 g/dL — ABNORMAL LOW (ref 12.0–15.0)

## 2022-01-10 LAB — APTT: aPTT: 136 seconds — ABNORMAL HIGH (ref 24–36)

## 2022-01-10 MED ORDER — DOCUSATE SODIUM 100 MG PO CAPS
100.0000 mg | ORAL_CAPSULE | Freq: Three times a day (TID) | ORAL | Status: DC | PRN
  Administered 2022-01-10 – 2022-01-20 (×4): 100 mg via ORAL
  Filled 2022-01-10 (×4): qty 1

## 2022-01-10 MED ORDER — ORAL CARE MOUTH RINSE
15.0000 mL | Freq: Two times a day (BID) | OROMUCOSAL | Status: DC
  Administered 2022-01-10 – 2022-01-26 (×19): 15 mL via OROMUCOSAL

## 2022-01-10 MED ORDER — CHLORHEXIDINE GLUCONATE 0.12 % MT SOLN
15.0000 mL | Freq: Two times a day (BID) | OROMUCOSAL | Status: DC
  Administered 2022-01-10 – 2022-01-26 (×20): 15 mL via OROMUCOSAL
  Filled 2022-01-10 (×27): qty 15

## 2022-01-10 MED ORDER — POTASSIUM CHLORIDE 10 MEQ/100ML IV SOLN
10.0000 meq | INTRAVENOUS | Status: AC
  Administered 2022-01-10 (×3): 10 meq via INTRAVENOUS
  Filled 2022-01-10 (×3): qty 100

## 2022-01-10 MED ORDER — MORPHINE SULFATE (PF) 4 MG/ML IV SOLN
4.0000 mg | INTRAVENOUS | Status: DC | PRN
  Administered 2022-01-10 – 2022-01-11 (×9): 4 mg via INTRAVENOUS
  Filled 2022-01-10 (×8): qty 1

## 2022-01-10 MED ORDER — MORPHINE SULFATE (PF) 4 MG/ML IV SOLN
INTRAVENOUS | Status: AC
  Filled 2022-01-10: qty 1

## 2022-01-10 MED ORDER — SODIUM CHLORIDE 0.9 % IV SOLN
2.0000 g | Freq: Three times a day (TID) | INTRAVENOUS | Status: DC
  Administered 2022-01-10 – 2022-01-12 (×6): 2 g via INTRAVENOUS
  Filled 2022-01-10 (×7): qty 2

## 2022-01-10 MED ORDER — POTASSIUM CHLORIDE 20 MEQ PO PACK
40.0000 meq | PACK | Freq: Once | ORAL | Status: DC
  Filled 2022-01-10: qty 2

## 2022-01-10 MED ORDER — POTASSIUM CHLORIDE 10 MEQ/100ML IV SOLN: 10.0000 meq | INTRAVENOUS | Status: DC

## 2022-01-10 NOTE — Progress Notes (Signed)
PHARMACY NOTE -  Cefepime  Pharmacy has been assisting with dosing of cefepime for IAI vs PNA. Given improved SCr, will increase dose to 2g IV q8 hr and further renal adjustments per institutional Pharmacy antibiotic protocol Stop time has been set for Cefepime/Flagyl for 7 days  Pharmacy will sign off, following peripherally for culture results or dose adjustments. Please reconsult if a change in clinical status warrants re-evaluation of dosage.  Reuel Boom, PharmD, BCPS [phone] 01/10/2022, 9:26 AM

## 2022-01-10 NOTE — Progress Notes (Addendum)
[place] for heparin Indication: atrial fibrillation- bridge therapy while PTA apixaban held  Allergies  Allergen Reactions   Penicillins Shortness Of Breath, Itching and Swelling    Has patient had a PCN reaction causing immediate rash, facial/tongue/throat swelling, SOB or lightheadedness with hypotension: Yes Has patient had a PCN reaction causing severe rash involving mucus membranes or skin necrosis:No Has patient had a PCN reaction that required hospitalization: No Has patient had a PCN reaction occurring within the last 10 years: no If all of the above answers are "NO", then may proceed with Cephalosporin use.  *Per patient, tolerated Keflex in the past      Patient Measurements: Height: 5\' 8"  (172.7 cm) Weight: 136 kg (299 lb 13.2 oz) IBW/kg (Calculated) : 63.9 Heparin Dosing Weight: 96.7 kg  Vital Signs: Temp: 97.5 F (36.4 C) (02/04 1220) Temp Source: Oral (02/04 1220) BP: 141/96 (02/04 1220) Pulse Rate: 102 (02/04 1220)  Labs: Recent Labs    01/08/22 0457 01/08/22 0816 01/09/22 0043 01/09/22 0812 01/09/22 1653 01/10/22 0555 01/10/22 1415  HGB 7.2*   < > 6.1* 7.8*  --  7.6* 8.2*  HCT 22.3*   < > 20.0* 24.3*  --  24.5* 25.9*  PLT 204  --  177  --   --  194  --   APTT 93*  --  93*  --  106* 136*  --   LABPROT 19.8*  --   --   --   --   --   --   INR 1.7*  --   --   --   --   --   --   HEPARINUNFRC >1.10*  --  1.09*  --   --  0.72* 0.45  CREATININE 1.90*  --  1.52*  --   --  1.16*  --    < > = values in this interval not displayed.    Estimated Creatinine Clearance: 76.4 mL/min (A) (by C-G formula based on SCr of 1.16 mg/dL (H)).  Assessment: 61 yo F on apixaban 5 mg po BID PTA for PAF, admitted for gastric outlet obstruction. Pharmacy to dose heparin for bridge therapy while apixaban held. Last dose of apixaban 5 mg was 1/30 at 9 am  Today, 01/10/2022 aPTT supratherapeutic and higher despite rate decrease to 1400  units/hr Heparin level decreased to borderline high CBC: Hgb low but stable after 1 unit PRBC yesterday; Plts WNL/stable No bleeding or infusion issues per RN  Goal of Therapy:  Heparin level 0.3-0.7 units/ml aPTT 66-102 seconds Monitor platelets by anticoagulation protocol: Yes   Plan:  Decrease heparin infusion to 1350 units/hr - given recent DOAC, a heparin level of 0.72 implies that the true heparin level is </= 0.72, suggesting that the aPTT of 136 is unreliable; will reduce rate slightly in case true heparin level is at or near 0.72 Recheck heparin level in 8 hr - no further aPTTs Daily CBC and heparin level Monitor closely for s/sx of bleeding  Reuel Boom, PharmD, BCPS [phone] 01/10/2022, 8:47 AM   ADDENDUM 8-hr heparin level now therapeutic on 1350 units/hr  Continue heparin at 1350 units/hr Recheck 8 hr level to ensure not continuing to drop  Reuel Boom, PharmD, BCPS [phone] 01/10/2022, 3:44 PM

## 2022-01-10 NOTE — Progress Notes (Signed)
PROGRESS NOTE    Leslie Mullen  YWV:[id number] DOB: 08-16-1961 DOA: 01/07/2022 PCP: Sue Lush, PA-C    Brief Narrative: This 61 years old female with PMH significant for CHB, s/p PPM, DM 2, hypertension, metastatic pancreatic cancer, endometrial cancer, morbid obesity presented in the ED with complaints of nausea and vomiting.  She was recently hospitalized from 1/26 - 1/30 for sepsis secondary to port infection.  At that time she was sent home with 5 days of oral doxycycline.  Blood cultures were negative.  Patient has acutely developed nausea and vomiting,  She was unable to take anything down she has tried Compazine and antacids did not help.  She was feeling dizzy by end of day with worsening nausea and vomiting. Patient is admitted for sepsis secondary to pancreatic infection versus left lower lobe pneumonia.  She is also found to have gastric outlet obstruction, general surgery, gastroenterology, oncology, palliative care consulted.  Assessment & Plan:   Principal Problem:   Gastric outlet obstruction Active Problems:   Type 2 diabetes mellitus with diabetic polyneuropathy, with long-term current use of insulin (HCC)   Paroxysmal atrial fibrillation (HCC)   AKI (acute kidney injury) ([place])   Pancreatic cancer metastasized to liver (HCC)   Sepsis (HCC)  Sepsis sec. to pancreatic infection versus left lower lobe pneumonia: Continue IV antibiotics (cefepime,  Flagyl) Continue IV fluids.  Continue supportive care.  Lactic acid normalized with IV hydration, back to 2.1 again. Procalcitonin 0.67>0.26.  Blood cultures no growth to date. Sepsis physiology improving.  Gastric outlet obstruction: Pancreatic cancer with mets to the liver: Patient with metastatic pancreatic cancer followed by Dr. Annamaria Boots on palliative chemotherapy presented with nausea, vomiting and found to have gastric outlet obstruction secondary to pancreatic head lesion.  Continued NG tube decompression.  She was kept  NPO. Patient had poor prognosis and poor tolerance to chemotherapy.  She is not a candidate for more cytotoxic therapy.  Goals of care at this point is palliative and comfort. GI consulted, EGD attempted with possible stent placement.  She feels improved.  NG tube removed. Patient is now tolerating clear liquid diet, without nausea and vomiting. GI recommended multiple small meals, plenty of fluids, antireflux measures. Palliative care consulted.  Patient is considering hospice services.  Wishes to discuss further with her family.  Diabetes mellitus type 2: Continue sliding scale.  AKI: > Improving Could be due to dehydration, Dec. PO intake. Continue IV hydration.  Serum creatinine improving 1.16 Avoid nephrotoxic medications CT abdomen without obstruction.  Essential hypertension: Hydralazine as needed. Resume home medication once patient is able to take p.o.  Paroxysmal A-fib: Heart rate controlled.  Continue heparin GTT.  Hypokalemia: Replaced.  Continue to monitor.  Normochromic normocytic anemia: Her hemoglobin has dropped from 8.0-7.4>6.1 Transfuse 1 PRBC, posttrans Hb 7.8>7.6 Continue to monitor H&H.  DVT prophylaxis: Heparin gtt Code Status: Full code. Family Communication: No family at bed side. Disposition Plan:   Status is: Inpatient Remains inpatient appropriate because: Admitted for sepsis and gastric outlet obstruction in the setting of pancreatic metastatic carcinoma.  General surgery GI and oncology consulted.  Patient underwent EGD with stenting.  Tolerated well,  She is now tolerating clear liquid diet.  Consultants:  Oncology General surgery Gastroenterology Palliative care  Procedures: Gastrostomy tube  antimicrobials:   Anti-infectives (From admission, onward)    Start     Dose/Rate Route Frequency Ordered Stop   01/10/22 1500  ceFEPIme (MAXIPIME) 2 g in sodium chloride 0.9 % 100 mL IVPB  2 g 200 mL/hr over 30 Minutes Intravenous Every 8  hours 01/10/22 0926 01/14/22 0559   01/07/22 2000  ceFEPIme (MAXIPIME) 2 g in sodium chloride 0.9 % 100 mL IVPB  Status:  Discontinued        2 g 200 mL/hr over 30 Minutes Intravenous Every 12 hours 01/07/22 0935 01/10/22 0926   01/07/22 1800  metroNIDAZOLE (FLAGYL) IVPB 500 mg        500 mg 100 mL/hr over 60 Minutes Intravenous Every 12 hours 01/07/22 1159 01/14/22 1759   01/07/22 0645  ceFEPIme (MAXIPIME) 2 g in sodium chloride 0.9 % 100 mL IVPB        2 g 200 mL/hr over 30 Minutes Intravenous  Once 01/07/22 0641 01/07/22 0822   01/07/22 0645  metroNIDAZOLE (FLAGYL) IVPB 500 mg        500 mg 100 mL/hr over 60 Minutes Intravenous  Once 01/07/22 0641 01/07/22 0757        Subjective: Patient was seen and examined at bedside.  Overnight events noted.   She underwent EGD with stenting ,  tolerated well. She denies any nausea, vomiting, in fact she is tolerating clear liquid diet now.   She is able to pass flatus.  She reports feeling overall better.  Objective: Vitals:   01/09/22 1115 01/09/22 1145 01/09/22 2130 01/10/22 0531  BP: 127/62 119/63 116/63 131/65  Pulse: 85 82 73 94  Resp: 16 16 14 18   Temp: 97.7 F (36.5 C) 98.2 F (36.8 C) 98 F (36.7 C) 98.4 F (36.9 C)  TempSrc:  Oral Oral Oral  SpO2: 100%  99% 100%  Weight:      Height:        Intake/Output Summary (Last 24 hours) at 01/10/2022 1153 Last data filed at 01/10/2022 0700 Gross per 24 hour  Intake 3235.49 ml  Output 650 ml  Net 2585.49 ml   Filed Weights   01/07/22 0500 01/07/22 0502 01/08/22 1229  Weight: (!) 137.8 kg 136.1 kg 136 kg    Examination:  General exam: Appears deconditioned, not in any distress, chronically ill looking. Respiratory system: Clear to auscultation bilaterally. Respiratory effort normal.  RR 13 Cardiovascular system: S1 & S2 heard, regular rate and rhythm, no murmur. Gastrointestinal system: Abdomen is soft, non distended, nontender,  bowel sounds +. Central nervous system:  Alert and oriented x 3. No focal neurological deficits. Extremities: Bilateral pedal edema, no cyanosis no clubbing. Skin: No rashes, lesions or ulcers Psychiatry: Judgement and insight appear normal. Mood & affect appropriate.     Data Reviewed: I have personally reviewed following labs and imaging studies  CBC: Recent Labs  Lab 01/04/22 0414 01/05/22 0104 01/07/22 0556 01/08/22 0457 01/08/22 0816 01/09/22 0043 01/09/22 0812 01/10/22 0555  WBC 5.1 6.7 15.3* 15.2*  --  10.9*  --  10.4  NEUTROABS 3.1 4.3  --   --   --   --   --   --   HGB 8.3* 8.0* 10.6* 7.2* 7.4* 6.1* 7.8* 7.6*  HCT 25.9* 25.3* 33.1* 22.3* 22.7* 20.0* 24.3* 24.5*  MCV 90.9 91.7 90.2 91.4  --  94.8  --  90.7  PLT 180 184 294 204  --  177  --  132   Basic Metabolic Panel: Recent Labs  Lab 01/04/22 0414 01/05/22 0104 01/07/22 0558 01/08/22 0457 01/09/22 0043 01/10/22 0555  NA 138 139 141 142 139 136  K 3.9 3.8 3.8 2.9* 2.8* 2.9*  CL 111 106 93* 95* 97* 95*  CO2 22 24 25  35* 36* 31  GLUCOSE 178* 175* 376* 215* 251* 193*  BUN 13 11 24* 33* 30* 19  CREATININE 1.28* 1.20* 1.69* 1.90* 1.52* 1.16*  CALCIUM 7.1* 7.7* 8.9 7.7* 7.0* 6.8*  MG 1.9 2.0  --   --  2.0  --   PHOS  --   --   --   --  3.2  --    GFR: Estimated Creatinine Clearance: 76.4 mL/min (A) (by C-G formula based on SCr of 1.16 mg/dL (H)). Liver Function Tests: Recent Labs  Lab 01/05/22 0104 01/07/22 0558 01/08/22 0457  AST 17 17 14*  ALT 10 13 10   ALKPHOS 82 86 57  BILITOT 0.2* 0.8 0.9  PROT 6.0* 8.2* 5.4*  ALBUMIN <1.5* 2.3* 1.9*   Recent Labs  Lab 01/07/22 0558  LIPASE 43   No results for input(s): AMMONIA in the last 168 hours. Coagulation Profile: Recent Labs  Lab 01/07/22 0558 01/08/22 0457  INR 1.6* 1.7*   Cardiac Enzymes: No results for input(s): CKTOTAL, CKMB, CKMBINDEX, TROPONINI in the last 168 hours.  BNP (last 3 results) No results for input(s): PROBNP in the last 8760 hours. HbA1C: No results for  input(s): HGBA1C in the last 72 hours.  CBG: Recent Labs  Lab 01/09/22 1609 01/09/22 1935 01/10/22 0012 01/10/22 0503 01/10/22 0744  GLUCAP 238* 166* 172* 180* 183*   Lipid Profile: No results for input(s): CHOL, HDL, LDLCALC, TRIG, CHOLHDL, LDLDIRECT in the last 72 hours. Thyroid Function Tests: No results for input(s): TSH, T4TOTAL, FREET4, T3FREE, THYROIDAB in the last 72 hours. Anemia Panel: No results for input(s): VITAMINB12, FOLATE, FERRITIN, TIBC, IRON, RETICCTPCT in the last 72 hours. Sepsis Labs: Recent Labs  Lab 01/07/22 0556 01/07/22 0838 01/07/22 1240 01/07/22 1717 01/08/22 0457  PROCALCITON  --   --   --   --  0.26  LATICACIDVEN 2.3* 2.5* 1.6 2.1*  --     Recent Results (from the past 240 hour(s))  Resp Panel by RT-PCR (Flu A&B, Covid) Nasopharyngeal Swab     Status: None   Collection Time: 01/01/22  8:16 AM   Specimen: Nasopharyngeal Swab; Nasopharyngeal(NP) swabs in vial transport medium  Result Value Ref Range Status   SARS Coronavirus 2 by RT PCR NEGATIVE NEGATIVE Final    Comment: (NOTE) SARS-CoV-2 target nucleic acids are NOT DETECTED.  The SARS-CoV-2 RNA is generally detectable in upper respiratory specimens during the acute phase of infection. The lowest concentration of SARS-CoV-2 viral copies this assay can detect is 138 copies/mL. A negative result does not preclude SARS-Cov-2 infection and should not be used as the sole basis for treatment or other patient management decisions. A negative result may occur with  improper specimen collection/handling, submission of specimen other than nasopharyngeal swab, presence of viral mutation(s) within the areas targeted by this assay, and inadequate number of viral copies(<138 copies/mL). A negative result must be combined with clinical observations, patient history, and epidemiological information. The expected result is Negative.  Fact Sheet for Patients:   EntrepreneurPulse.com.au  Fact Sheet for Healthcare Providers:  IncredibleEmployment.be  This test is no t yet approved or cleared by the [place] FDA and  has been authorized for detection and/or diagnosis of SARS-CoV-2 by FDA under an Emergency Use Authorization (EUA). This EUA will remain  in effect (meaning this test can be used) for the duration of the COVID-19 declaration under Section 564(b)(1) of the Act, 21 U.S.C.section 360bbb-3(b)(1), unless the authorization is terminated  or revoked  sooner.       Influenza A by PCR NEGATIVE NEGATIVE Final   Influenza B by PCR NEGATIVE NEGATIVE Final    Comment: (NOTE) The Xpert Xpress SARS-CoV-2/FLU/RSV plus assay is intended as an aid in the diagnosis of influenza from Nasopharyngeal swab specimens and should not be used as a sole basis for treatment. Nasal washings and aspirates are unacceptable for Xpert Xpress SARS-CoV-2/FLU/RSV testing.  Fact Sheet for Patients: EntrepreneurPulse.com.au  Fact Sheet for Healthcare Providers: IncredibleEmployment.be  This test is not yet approved or cleared by the [place] FDA and has been authorized for detection and/or diagnosis of SARS-CoV-2 by FDA under an Emergency Use Authorization (EUA). This EUA will remain in effect (meaning this test can be used) for the duration of the COVID-19 declaration under Section 564(b)(1) of the Act, 21 U.S.C. section 360bbb-3(b)(1), unless the authorization is terminated or revoked.  Performed at [facility], [place] [address], [place], [place] [address]   Urine Culture     Status: Abnormal   Collection Time: 01/01/22  8:43 AM   Specimen: Urine, Clean Catch  Result Value Ref Range Status   Specimen Description URINE, CLEAN CATCH  Final   Special Requests   Final    NONE Performed at [facility], [place] [address], [place], [place] [address]    Culture (A)   Final    >=100,000 COLONIES/mL MULTIPLE SPECIES PRESENT, SUGGEST RECOLLECTION   Report Status 01/02/2022 FINAL  Final  Culture, blood (routine x 2)     Status: None   Collection Time: 01/01/22 11:52 AM   Specimen: BLOOD  Result Value Ref Range Status   Specimen Description BLOOD BLOOD RIGHT ARM  Final   Special Requests   Final    BOTTLES DRAWN AEROBIC AND ANAEROBIC Blood Culture results may not be optimal due to an inadequate volume of blood received in culture bottles   Culture   Final    NO GROWTH 5 DAYS Performed at [facility], [place] [address], [place], [place] [address]    Report Status 01/06/2022 FINAL  Final  Culture, blood (routine x 2)     Status: None   Collection Time: 01/02/22  1:27 AM   Specimen: BLOOD  Result Value Ref Range Status   Specimen Description BLOOD SITE NOT SPECIFIED  Final   Special Requests   Final    AEROBIC BOTTLE ONLY Blood Culture results may not be optimal due to an inadequate volume of blood received in culture bottles   Culture   Final    NO GROWTH 5 DAYS Performed at [facility], [place] [address] [place], [place] [address]    Report Status 01/07/2022 FINAL  Final  Blood Culture (routine x 2)     Status: None (Preliminary result)   Collection Time: 01/07/22  5:59 AM   Specimen: Site Not Specified; Blood  Result Value Ref Range Status   Specimen Description   Final    SITE NOT SPECIFIED Performed at [place] [address], [place], [place] [address]    Special Requests   Final    BOTTLES DRAWN AEROBIC AND ANAEROBIC Blood Culture adequate volume Performed at [place] [address] [place], [place] [address]    Culture   Final    NO GROWTH 3 DAYS Performed at [facility], [place] [address], [place],  [address]    Report Status PENDING  Incomplete  Urine Culture  Status: None   Collection Time: 01/07/22  5:59 AM   Specimen: In/Out Cath Urine  Result Value Ref  Range Status   Specimen Description   Final    IN/OUT CATH URINE Performed at [facility], [place] [address] [place], [place] [address]    Special Requests   Final    NONE Performed at [facility], [place] [address] [place], [place] [address]    Culture   Final    NO GROWTH Performed at [facility], [place] [address] [place], [place] [address]    Report Status 01/09/2022 FINAL  Final  Resp Panel by RT-PCR (Flu A&B, Covid) Nasopharyngeal Swab     Status: None   Collection Time: 01/07/22  8:38 AM   Specimen: Nasopharyngeal Swab; Nasopharyngeal(NP) swabs in vial transport medium  Result Value Ref Range Status   SARS Coronavirus 2 by RT PCR NEGATIVE NEGATIVE Final    Comment: (NOTE) SARS-CoV-2 target nucleic acids are NOT DETECTED.  The SARS-CoV-2 RNA is generally detectable in upper respiratory specimens during the acute phase of infection. The lowest concentration of SARS-CoV-2 viral copies this assay can detect is 138 copies/mL. A negative result does not preclude SARS-Cov-2 infection and should not be used as the sole basis for treatment or other patient management decisions. A negative result may occur with  improper specimen collection/handling, submission of specimen other than nasopharyngeal swab, presence of viral mutation(s) within the areas targeted by this assay, and inadequate number of viral copies(<138 copies/mL). A negative result must be combined with clinical observations, patient history, and epidemiological information. The expected result is Negative.  Fact Sheet for Patients:  EntrepreneurPulse.com.au  Fact Sheet for Healthcare Providers:  IncredibleEmployment.be  This test is no t yet approved or cleared by the [place] FDA and  has been authorized for detection and/or diagnosis of SARS-CoV-2 by FDA under an Emergency Use Authorization (EUA). This EUA will remain  in effect  (meaning this test can be used) for the duration of the COVID-19 declaration under Section 564(b)(1) of the Act, 21 U.S.C.section 360bbb-3(b)(1), unless the authorization is terminated  or revoked sooner.       Influenza A by PCR NEGATIVE NEGATIVE Final   Influenza B by PCR NEGATIVE NEGATIVE Final    Comment: (NOTE) The Xpert Xpress SARS-CoV-2/FLU/RSV plus assay is intended as an aid in the diagnosis of influenza from Nasopharyngeal swab specimens and should not be used as a sole basis for treatment. Nasal washings and aspirates are unacceptable for Xpert Xpress SARS-CoV-2/FLU/RSV testing.  Fact Sheet for Patients: EntrepreneurPulse.com.au  Fact Sheet for Healthcare Providers: IncredibleEmployment.be  This test is not yet approved or cleared by the [place] FDA and has been authorized for detection and/or diagnosis of SARS-CoV-2 by FDA under an Emergency Use Authorization (EUA). This EUA will remain in effect (meaning this test can be used) for the duration of the COVID-19 declaration under Section 564(b)(1) of the Act, 21 U.S.C. section 360bbb-3(b)(1), unless the authorization is terminated or revoked.  Performed at [facility], [place] [address], [place], [place] [address]   Blood Culture (routine x 2)     Status: None (Preliminary result)   Collection Time: 01/07/22  5:17 PM   Specimen: BLOOD  Result Value Ref Range Status   Specimen Description   Final    BLOOD RIGHT ANTECUBITAL Performed at [place] [address], [place], [place] [address]    Special Requests   Final  BOTTLES DRAWN AEROBIC ONLY Blood Culture results may not be optimal due to an inadequate volume of blood received in culture bottles Performed at [facility], [place] [address] [place], [place] [address]    Culture   Final    NO GROWTH 3 DAYS Performed at [facility], [place] [address]  [place], [place] [address]    Report Status PENDING  Incomplete         Radiology Studies: DG C-Arm 1-60 Min  Result Date: 01/08/2022 CLINICAL DATA:  History of metastatic pancreatic cancer now with duodenal obstruction. EXAM: ERCP TECHNIQUE: Multiple spot images obtained with the fluoroscopic device and submitted for interpretation post-procedure. FLUOROSCOPY TIME: FLUOROSCOPY TIME 1 minute, 7 seconds (29.3 mGy) COMPARISON:  CT abdomen and pelvis-01/07/2022 FINDINGS: Four spot intraoperative fluoroscopic images of the right upper abdominal quadrant during ERCP are provided for review. Initial image demonstrates an ERCP probe overlying the right upper abdominal quadrant. Subsequent images demonstrate placement of a enteric stent which appears to traverse the expected location of the gastric antrum, duodenal bulb and proximal aspect of the descending duodenum. IMPRESSION: Post gastric outlet and proximal duodenal enteric stent placement. These images were submitted for radiologic interpretation only. Please see the procedural report for the amount of contrast and the fluoroscopy time utilized. Electronically Signed   By: Sandi Mariscal M.D.   On: 01/08/2022 14:05   CUP PACEART REMOTE DEVICE CHECK  Result Date: 01/09/2022 Scheduled remote reviewed. Normal device function.  7 NSVT, 5 fast AV.  All EGM's show regular 1:1, rates 154-182, longest duration 84min 50sec Overall controlled ventricular rates Decrease in activity noted Next remote 91 days. LA   Scheduled Meds:  sodium chloride   Intravenous Once   chlorhexidine  15 mL Mouth Rinse BID   insulin aspart  0-20 Units Subcutaneous TID WC   insulin aspart  0-5 Units Subcutaneous QHS   mouth rinse  15 mL Mouth Rinse q12n4p   pantoprazole (PROTONIX) IV  40 mg Intravenous Q12H   potassium chloride  40 mEq Oral Once   potassium chloride  40 mEq Oral Once   Continuous Infusions:  sodium chloride 100 mL/hr at 01/10/22 0232   sodium chloride Stopped  (01/08/22 1220)   ceFEPime (MAXIPIME) IV     heparin 1,350 Units/hr (01/10/22 0919)   metronidazole 500 mg (01/10/22 0552)   potassium chloride 10 mEq (01/10/22 1037)   promethazine (PHENERGAN) injection (IM or IVPB)       LOS: 3 days    Time spent: 35 mins    Junius Faucett, MD [facility]   If 7PM-7AM, please contact night-coverage

## 2022-01-10 NOTE — Plan of Care (Signed)

## 2022-01-10 NOTE — Progress Notes (Signed)
Notified on call provider about patient having severe abdominal pain and having PRN 2mg /mL morphine IV every 4 hours for severe pain. Last administration of PRN morphine was 2344, and it was not time to give another dose. On call provider changed the order from PRN morphine 2mg /mL IV every 4 hours for severe pain to PRN morphine 4mg /mL IV every 4 hours for severe pain.

## 2022-01-10 NOTE — Progress Notes (Signed)
Leslie Mullen 2:09 PM  Subjective: Patient doing okay tolerating clear liquids no new complaints Case discussed with her husband moving her bowels okay with trying full liquids  Objective: Vital signs stable afebrile not examined today sitting on the potty chair labs okay  Assessment: Metastatic pancreatic cancer with gastric outlet obstruction status post stenting  Plan: Will allow full liquids can advance to low residue diet tomorrow if doing well and please call me this weekend if I can be of any further assistance  [facility]  office [phone] After 5PM or if no answer call [phone]

## 2022-01-10 NOTE — Progress Notes (Signed)
Notified on call provider that patient had a 9 second run of SVT with a heart rate getting up to 157. Patient's heart rate came back down to 70s/80s. Patient was asymptomatic other than having 9/10 abdominal pain. Gave PRN morphine for the pain.

## 2022-01-11 DIAGNOSIS — Z7189 Other specified counseling: Secondary | ICD-10-CM | POA: Diagnosis not present

## 2022-01-11 DIAGNOSIS — Z515 Encounter for palliative care: Secondary | ICD-10-CM

## 2022-01-11 DIAGNOSIS — K311 Adult hypertrophic pyloric stenosis: Secondary | ICD-10-CM | POA: Diagnosis not present

## 2022-01-11 DIAGNOSIS — R531 Weakness: Secondary | ICD-10-CM | POA: Diagnosis not present

## 2022-01-11 LAB — BASIC METABOLIC PANEL
Anion gap: 6 (ref 5–15)
BUN: 17 mg/dL (ref 6–20)
CO2: 31 mmol/L (ref 22–32)
Calcium: 6.9 mg/dL — ABNORMAL LOW (ref 8.9–10.3)
Chloride: 99 mmol/L (ref 98–111)
Creatinine, Ser: 0.96 mg/dL (ref 0.44–1.00)
GFR, Estimated: >60 mL/min (ref >60)
Glucose, Bld: 171 mg/dL — ABNORMAL HIGH (ref 70–99)
Potassium: 3.1 mmol/L — ABNORMAL LOW (ref 3.5–5.1)
Sodium: 136 mmol/L (ref 135–145)

## 2022-01-11 LAB — CBC
HCT: 24.9 % — ABNORMAL LOW (ref 36.0–46.0)
Hemoglobin: 7.7 g/dL — ABNORMAL LOW (ref 12.0–15.0)
MCH: 29.1 pg (ref 26.0–34.0)
MCHC: 30.9 g/dL (ref 30.0–36.0)
MCV: 94 fL (ref 80.0–100.0)
Platelets: 219 10*3/uL (ref 150–400)
RBC: 2.65 MIL/uL — ABNORMAL LOW (ref 3.87–5.11)
RDW: 19.7 % — ABNORMAL HIGH (ref 11.5–15.5)
WBC: 11 10*3/uL — ABNORMAL HIGH (ref 4.0–10.5)
nRBC: 0.4 % — ABNORMAL HIGH (ref 0.0–0.2)

## 2022-01-11 LAB — GLUCOSE, CAPILLARY
Glucose-Capillary: 142 mg/dL — ABNORMAL HIGH (ref 70–99)
Glucose-Capillary: 165 mg/dL — ABNORMAL HIGH (ref 70–99)
Glucose-Capillary: 172 mg/dL — ABNORMAL HIGH (ref 70–99)
Glucose-Capillary: 180 mg/dL — ABNORMAL HIGH (ref 70–99)
Glucose-Capillary: 192 mg/dL — ABNORMAL HIGH (ref 70–99)
Glucose-Capillary: 207 mg/dL — ABNORMAL HIGH (ref 70–99)

## 2022-01-11 LAB — MAGNESIUM: Magnesium: 1.7 mg/dL (ref 1.7–2.4)

## 2022-01-11 LAB — HEPARIN LEVEL (UNFRACTIONATED): Heparin Unfractionated: 0.44 IU/mL (ref 0.30–0.70)

## 2022-01-11 LAB — PHOSPHORUS: Phosphorus: 2 mg/dL — ABNORMAL LOW (ref 2.5–4.6)

## 2022-01-11 MED ORDER — MORPHINE SULFATE (PF) 2 MG/ML IV SOLN: 2.0000 mg | Freq: Once | INTRAVENOUS | Status: DC

## 2022-01-11 MED ORDER — HYDROMORPHONE HCL 1 MG/ML IJ SOLN
1.0000 mg | INTRAMUSCULAR | Status: DC | PRN
  Administered 2022-01-11 – 2022-01-12 (×6): 2 mg via INTRAVENOUS
  Administered 2022-01-12: 1 mg via INTRAVENOUS
  Administered 2022-01-12 – 2022-01-14 (×11): 2 mg via INTRAVENOUS
  Administered 2022-01-14: 1 mg via INTRAVENOUS
  Administered 2022-01-15 (×4): 2 mg via INTRAVENOUS
  Filled 2022-01-11 (×4): qty 2
  Filled 2022-01-11: qty 1
  Filled 2022-01-11: qty 2
  Filled 2022-01-11: qty 1
  Filled 2022-01-11 (×18): qty 2

## 2022-01-11 MED ORDER — POTASSIUM CHLORIDE 20 MEQ PO PACK: 40.0000 meq | PACK | Freq: Once | ORAL | Status: DC

## 2022-01-11 MED ORDER — CALCIUM CARBONATE 1250 (500 CA) MG PO TABS
1.0000 | ORAL_TABLET | Freq: Two times a day (BID) | ORAL | Status: DC
  Administered 2022-01-11 – 2022-01-19 (×16): 500 mg via ORAL
  Filled 2022-01-11 (×19): qty 1

## 2022-01-11 MED ORDER — POTASSIUM PHOSPHATES 15 MMOLE/5ML IV SOLN
30.0000 mmol | Freq: Once | INTRAVENOUS | Status: AC
  Administered 2022-01-11: 30 mmol via INTRAVENOUS
  Filled 2022-01-11: qty 10

## 2022-01-11 MED ORDER — POTASSIUM CHLORIDE CRYS ER 20 MEQ PO TBCR
40.0000 meq | EXTENDED_RELEASE_TABLET | Freq: Once | ORAL | Status: AC
  Administered 2022-01-11: 40 meq via ORAL
  Filled 2022-01-11: qty 2

## 2022-01-11 NOTE — Plan of Care (Signed)
°  Problem: Education: Goal: Knowledge of General Education information will improve Description: Including pain rating scale, medication(s)/side effects and non-pharmacologic comfort measures Outcome: Progressing   Problem: Activity: Goal: Risk for activity intolerance will decrease Outcome: Progressing   Problem: Coping: Goal: Level of anxiety will decrease Outcome: Progressing   Problem: Nutrition: Goal: Adequate nutrition will be maintained Outcome: Not Progressing   Problem: Pain Managment: Goal: General experience of comfort will improve Outcome: Not Progressing

## 2022-01-11 NOTE — Progress Notes (Signed)
Daily Progress Note   Patient Name: Leslie Mullen       Date: 01/11/2022 DOB: 10/21/1961  Age: 61 y.o. MRN#: [id number] Attending Physician: Shawna Clamp, MD Primary Care Physician: Elinor Parkinson Admit Date: 01/07/2022  Reason for Consultation/Follow-up: Establishing goals of care, Hospice Evaluation, Non pain symptom management, and Pain control  Subjective: Patient is resting in bed.  She states that things are not going well.  She complains of nausea vomiting, not able to keep current diet down.  She complains of severe abdominal discomfort.  She states that the IV morphine helps but does not last long.  Husband at bedside.  Length of Stay: 4  Current Medications: Scheduled Meds:   sodium chloride   Intravenous Once   calcium carbonate  1 tablet Oral BID WC   chlorhexidine  15 mL Mouth Rinse BID   insulin aspart  0-20 Units Subcutaneous TID WC   insulin aspart  0-5 Units Subcutaneous QHS   mouth rinse  15 mL Mouth Rinse q12n4p    morphine injection  2 mg Intravenous Once   pantoprazole (PROTONIX) IV  40 mg Intravenous Q12H    Continuous Infusions:  sodium chloride Stopped (01/11/22 0955)   sodium chloride Stopped (01/08/22 1220)   ceFEPime (MAXIPIME) IV 2 g (01/11/22 1413)   heparin 1,350 Units/hr (01/10/22 2225)   metronidazole 500 mg (01/11/22 0526)   potassium PHOSPHATE IVPB (in mmol) Stopped (01/11/22 1412)   promethazine (PHENERGAN) injection (IM or IVPB)      PRN Meds: sodium chloride, docusate sodium, HYDROmorphone (DILAUDID) injection, metoprolol tartrate, prochlorperazine, promethazine (PHENERGAN) injection (IM or IVPB)  Physical Exam         Patient appears deconditioned, in mild distress Regular work of breathing Severe abdominal discomfort generalized as  well as more accelerated in the upper abdomen S1-S2 Has edema Awake alert oriented No focal deficits.  Vital Signs: BP (!) 147/81 (BP Location: Right Arm)    Pulse 79    Temp 97.9 F (36.6 C) (Oral)    Resp 18    Ht '5\' 8"'  (1.727 m)    Wt 136 kg    SpO2 98%    BMI 45.59 kg/m  SpO2: SpO2: 98 % O2 Device: O2 Device: Nasal Cannula O2 Flow Rate: O2 Flow Rate (L/min): 2 L/min  Intake/output summary:  Intake/Output Summary (Last 24 hours) at 01/11/2022 1536 Last data filed at 01/11/2022 1500 Gross per 24 hour  Intake 959.3 ml  Output 50 ml  Net 909.3 ml   LBM: Last BM Date: 01/09/22 Baseline Weight: Weight: (!) 137.8 kg Most recent weight: Weight: 136 kg       Palliative Assessment/Data:      Patient Active Problem List   Diagnosis Date Noted   Gastric outlet obstruction 01/07/2022   Sepsis ([place]) 01/02/2022   Dizziness 01/01/2022   SIRS (systemic inflammatory response syndrome) (HCC) 01/01/2022   Lactic acidosis 01/01/2022   Acute GI bleeding 12/18/2021   Symptomatic anemia 12/16/2021   Stage 3a chronic kidney disease (CKD) ([place]) 12/16/2021   Obesity, Class III, BMI 40-49.9 (morbid obesity) ([place]) 12/16/2021   Hyperlipidemia 12/16/2021   Anxiety 12/16/2021   Chronic pain 12/16/2021   Complete heart block ([place]) 12/16/2021   Port-A-Cath in place 11/18/2021   Family history of colon cancer 10/20/2021   Pancreatic cancer metastasized to liver ([place]) 10/14/2021   Pancreatic mass    Pancreatitis 10/05/2021   Leukocytosis 10/05/2021   AKI (acute kidney injury) ([place]) 10/05/2021   Chronic anticoagulation 10/05/2021   Thrombocytosis 10/05/2021   Paroxysmal atrial fibrillation ([place]) 09/18/2021   Secondary hypercoagulable state ([place]) 09/18/2021   Endometrial cancer ([place]) 07/29/2021   Heart block 10/10/2020   Fall at home, initial encounter 10/10/2020   Type 2 diabetes mellitus with diabetic polyneuropathy, with long-term current use of insulin ([place]) 01/31/2020   Type 2 diabetes  mellitus with hypoglycemia ([place]) 01/31/2020   Type 2 diabetes mellitus with stage 3a chronic kidney disease, with long-term current use of insulin ([place]) 01/31/2020   Abnormal feces    Benign neoplasm of ascending colon    Benign neoplasm of cecum    Benign neoplasm of transverse colon     Palliative Care Assessment & Plan   Patient Profile:    Assessment: Palliative performance scale 40%. 61 year old lady with life limiting illness of metastatic pancreatic cancer, left lower lobe pneumonia, metastatic disease burden to liver, admitted with gastric outlet obstruction secondary to pancreatic head lesion, status post stenting.  GI colleagues following closely and patient's diet was gradually being advanced.  Recommendations/Plan: CODE STATUS and goals of care discussions family meeting as well as symptom management discussions: I met with the patient and her husband at bedside.  Patient complains of abdominal discomfort and nausea vomiting and not able to tolerate her diet being advanced. Reviewed with the patient about her current pain and known pain symptom management regimen.  Discussed about rotating opioids from IV morphine to IV Dilaudid and monitoring. CODE STATUS discussions undertaken.  Extensively discussed about differences between full code versus DNR/DNI.  Expressed medical recommendations for considering DNR/DNI.  Discussed that the full scope of a resuscitative attempt would only cause more harm and discomfort than benefit at this point in my opinion given patient's life limiting illness of metastatic pancreatic cancer. Patient wishes to discuss further with her spouse and 2 children with regards to changing her CODE STATUS to DNR, full code for now. I introduced hospice philosophy of care to the patient today.  Discussed about differences between home with hospice versus residential hospice discussed about criteria for residential hospice. It remains to be seen how much oral  intake is reasonably possible.  It remains to be seen whether or not the patient would want a J-tube for artificial nutrition.  This will have to be discussed in the context of broad goals  of care discussions and patient understands the severity of her current condition. Spent some time providing compassionate supportive presence, active listening and other therapeutic techniques.  All of the patient's questions addressed to the best of my ability. Palliative medicine team to continue to follow.    Code Status:    Code Status Orders  (From admission, onward)           Start     Ordered   01/07/22 1200  Full code  Continuous        01/07/22 1159           Code Status History     Date Active Date Inactive Code Status Order ID Comments User Context   01/01/2022 1149 01/05/2022 2054 Full Code [id number]  Norval Morton, MD ED   12/19/2021 1027 12/20/2021 1944 Full Code [id number]  Little Ishikawa, MD Inpatient   12/16/2021 1819 12/19/2021 1027 DNR [id number]  Mariel Aloe, MD Inpatient   10/05/2021 1053 10/09/2021 2244 Full Code [id number]  Norval Morton, MD ED   07/29/2021 0518 07/29/2021 1422 Full Code [id number]  Dorothyann Gibbs, NP Inpatient   10/10/2020 1620 10/12/2020 1902 Full Code [id number]  Shirley Friar, PA-C ED   10/10/2020 1009 10/10/2020 1128 Full Code [id number]  Dorothyann Gibbs, NP Inpatient       Prognosis:  Guarded   Discharge Planning: To Be Determined  Care plan was discussed with patient and her husband.   Thank you for allowing the Palliative Medicine Team to assist in the care of this patient.   Time In: 1500 Time Out: 1540 Total Time 40 Prolonged Time Billed  no       Greater than 50%  of this time was spent counseling and coordinating care related to the above assessment and plan.  Loistine Chance, MD  Please contact Palliative Medicine Team phone at [phone] for questions and concerns.

## 2022-01-11 NOTE — Progress Notes (Signed)
PROGRESS NOTE    Leslie Mullen  RFF:[id number] DOB: 05-10-1961 DOA: 01/07/2022 PCP: Sue Lush, PA-C    Brief Narrative: This 61 years old female with PMH significant for CHB, s/p PPM, DM 2, hypertension, metastatic pancreatic cancer, endometrial cancer, morbid obesity presented in the ED with complaints of nausea and vomiting.  She was recently hospitalized from 1/26 - 1/30 for sepsis secondary to port infection.  At that time she was sent home with 5 days of oral doxycycline.  Blood cultures were negative.  Patient has acutely developed nausea and vomiting,  She was unable to take anything down she has tried Compazine and antacids did not help.  She was feeling dizzy by end of day with worsening nausea and vomiting. Patient is admitted for sepsis secondary to pancreatic infection versus left lower lobe pneumonia.  She is also found to have gastric outlet obstruction, general surgery, gastroenterology, oncology, palliative care consulted.  Assessment & Plan:   Principal Problem:   Gastric outlet obstruction Active Problems:   Type 2 diabetes mellitus with diabetic polyneuropathy, with long-term current use of insulin (HCC)   Paroxysmal atrial fibrillation (HCC)   AKI (acute kidney injury) ([place])   Pancreatic cancer metastasized to liver (HCC)   Sepsis (HCC)  Sepsis sec. to pancreatic infection versus left lower lobe pneumonia: Continue IV antibiotics (cefepime,  Flagyl) Continue IV fluids.  Continue supportive care.  Lactic acid normalized with IV hydration, back to 2.1 again. Procalcitonin 0.67>0.26.  Blood cultures no growth to date. Sepsis physiology improving. DC antibiotics after 5 days.  Gastric outlet obstruction: Pancreatic cancer with mets to the liver: Patient with metastatic pancreatic cancer followed by Dr. Annamaria Boots on palliative chemotherapy presented with nausea, vomiting and found to have gastric outlet obstruction secondary to pancreatic head lesion.  Continued NG tube  decompression.  She was kept NPO. Patient had poor prognosis and poor tolerance to chemotherapy.  She is not a candidate for more cytotoxic therapy.  Goals of care at this point is palliative and comfort. GI consulted, EGD attempted with possible stent placement.  She feels improved.  NG tube removed. Patient is now tolerating clear liquid diet, without nausea and vomiting. GI recommended multiple small meals, plenty of fluids, antireflux measures. Patient tolerated clear liquid diet,  advanced to low residue diet tomorrow.  Diabetes mellitus type 2: Continue sliding scale.  AKI: > Resolved. Could be due to dehydration, Dec. PO intake. Continue IV hydration.  Serum creatinine improving 1.16 Avoid nephrotoxic medications CT abdomen without obstruction.  Essential hypertension: Hydralazine as needed. Resume home medication once patient is able to take p.o.  Paroxysmal A-fib: Heart rate controlled.  Continue heparin GTT. Resume Eliquis tomorrow.  Hypokalemia/hypophosphatemia: Replaced.  Continue to monitor.  Normochromic normocytic anemia: Her hemoglobin has dropped from 8.0-7.4>6.1 Transfuse 1 PRBC, posttrans Hb 7.8>7.6>8.2>7.7 Continue to monitor H&H.  DVT prophylaxis: Heparin gtt Code Status: Full code. Family Communication: No family at bed side. Disposition Plan:   Status is: Inpatient Remains inpatient appropriate because: Admitted for sepsis and gastric outlet obstruction in the setting of pancreatic metastatic carcinoma.  General surgery GI and oncology consulted.  Patient underwent EGD with stenting.  Tolerated well,  She is now tolerating clear liquid diet.  Anticipated discharge home in 1 to 2 days.  Consultants:  Oncology General surgery Gastroenterology Palliative care  Procedures: Gastrostomy tube  antimicrobials:   Anti-infectives (From admission, onward)    Start     Dose/Rate Route Frequency Ordered Stop   01/10/22 1500  ceFEPIme (MAXIPIME) 2 g in  sodium chloride 0.9 % 100 mL IVPB        2 g 200 mL/hr over 30 Minutes Intravenous Every 8 hours 01/10/22 0926 01/14/22 0559   01/07/22 2000  ceFEPIme (MAXIPIME) 2 g in sodium chloride 0.9 % 100 mL IVPB  Status:  Discontinued        2 g 200 mL/hr over 30 Minutes Intravenous Every 12 hours 01/07/22 0935 01/10/22 0926   01/07/22 1800  metroNIDAZOLE (FLAGYL) IVPB 500 mg        500 mg 100 mL/hr over 60 Minutes Intravenous Every 12 hours 01/07/22 1159 01/14/22 1759   01/07/22 0645  ceFEPIme (MAXIPIME) 2 g in sodium chloride 0.9 % 100 mL IVPB        2 g 200 mL/hr over 30 Minutes Intravenous  Once 01/07/22 0641 01/07/22 0822   01/07/22 0645  metroNIDAZOLE (FLAGYL) IVPB 500 mg        500 mg 100 mL/hr over 60 Minutes Intravenous  Once 01/07/22 0641 01/07/22 0757        Subjective: Patient was seen and examined at bedside.  Overnight events noted.   She underwent EGD with stenting ,  tolerated well. She is not tolerating full liquid diet, advanced to low residue. She is able to pass flatus.  She reports feeling overall better.  Objective: Vitals:   01/10/22 0531 01/10/22 1220 01/10/22 2008 01/11/22 0618  BP: 131/65 (!) 141/96 117/87 (!) 155/79  Pulse: 94 (!) 102 78 79  Resp: 18 18 18 18   Temp: 98.4 F (36.9 C) (!) 97.5 F (36.4 C) 98.1 F (36.7 C) 98.1 F (36.7 C)  TempSrc: Oral Oral Oral Oral  SpO2: 100% 98% 100% 100%  Weight:      Height:        Intake/Output Summary (Last 24 hours) at 01/11/2022 1155 Last data filed at 01/11/2022 1000 Gross per 24 hour  Intake 400.49 ml  Output 750 ml  Net -349.51 ml   Filed Weights   01/07/22 0500 01/07/22 0502 01/08/22 1229  Weight: (!) 137.8 kg 136.1 kg 136 kg    Examination:  General exam: Appears deconditioned, not in any distress, comfortable Respiratory system: Clear to auscultation bilaterally. Respiratory effort normal.  RR 12 Cardiovascular system: S1 & S2 heard, regular rate and rhythm, no murmur. Gastrointestinal system:  Abdomen is soft, non distended, nontender,  bowel sounds +. Central nervous system: Alert and oriented x 3. No focal neurological deficits. Extremities: Bilateral pedal edema, no cyanosis no clubbing. Skin: No rashes, lesions or ulcers Psychiatry: Judgement and insight appear normal. Mood & affect appropriate.     Data Reviewed: I have personally reviewed following labs and imaging studies  CBC: Recent Labs  Lab 01/05/22 0104 01/07/22 0556 01/08/22 0457 01/08/22 0816 01/09/22 0043 01/09/22 0812 01/10/22 0555 01/10/22 1415 01/11/22 0019  WBC 6.7 15.3* 15.2*  --  10.9*  --  10.4  --  11.0*  NEUTROABS 4.3  --   --   --   --   --   --   --   --   HGB 8.0* 10.6* 7.2*   < > 6.1* 7.8* 7.6* 8.2* 7.7*  HCT 25.3* 33.1* 22.3*   < > 20.0* 24.3* 24.5* 25.9* 24.9*  MCV 91.7 90.2 91.4  --  94.8  --  90.7  --  94.0  PLT 184 294 204  --  177  --  194  --  219   < > =  values in this interval not displayed.   Basic Metabolic Panel: Recent Labs  Lab 01/05/22 0104 01/07/22 0558 01/08/22 0457 01/09/22 0043 01/10/22 0555 01/11/22 0019  NA 139 141 142 139 136 136  K 3.8 3.8 2.9* 2.8* 2.9* 3.1*  CL 106 93* 95* 97* 95* 99  CO2 24 25 35* 36* 31 31  GLUCOSE 175* 376* 215* 251* 193* 171*  BUN 11 24* 33* 30* 19 17  CREATININE 1.20* 1.69* 1.90* 1.52* 1.16* 0.96  CALCIUM 7.7* 8.9 7.7* 7.0* 6.8* 6.9*  MG 2.0  --   --  2.0  --  1.7  PHOS  --   --   --  3.2  --  2.0*   GFR: Estimated Creatinine Clearance: 92.3 mL/min (by C-G formula based on SCr of 0.96 mg/dL). Liver Function Tests: Recent Labs  Lab 01/05/22 0104 01/07/22 0558 01/08/22 0457  AST 17 17 14*  ALT 10 13 10   ALKPHOS 82 86 57  BILITOT 0.2* 0.8 0.9  PROT 6.0* 8.2* 5.4*  ALBUMIN <1.5* 2.3* 1.9*   Recent Labs  Lab 01/07/22 0558  LIPASE 43   No results for input(s): AMMONIA in the last 168 hours. Coagulation Profile: Recent Labs  Lab 01/07/22 0558 01/08/22 0457  INR 1.6* 1.7*   Cardiac Enzymes: No results for  input(s): CKTOTAL, CKMB, CKMBINDEX, TROPONINI in the last 168 hours.  BNP (last 3 results) No results for input(s): PROBNP in the last 8760 hours. HbA1C: No results for input(s): HGBA1C in the last 72 hours.  CBG: Recent Labs  Lab 01/10/22 2005 01/11/22 0030 01/11/22 0419 01/11/22 0731 01/11/22 1129  GLUCAP 147* 165* 172* 180* 192*   Lipid Profile: No results for input(s): CHOL, HDL, LDLCALC, TRIG, CHOLHDL, LDLDIRECT in the last 72 hours. Thyroid Function Tests: No results for input(s): TSH, T4TOTAL, FREET4, T3FREE, THYROIDAB in the last 72 hours. Anemia Panel: No results for input(s): VITAMINB12, FOLATE, FERRITIN, TIBC, IRON, RETICCTPCT in the last 72 hours. Sepsis Labs: Recent Labs  Lab 01/07/22 0556 01/07/22 0838 01/07/22 1240 01/07/22 1717 01/08/22 0457  PROCALCITON  --   --   --   --  0.26  LATICACIDVEN 2.3* 2.5* 1.6 2.1*  --     Recent Results (from the past 240 hour(s))  Culture, blood (routine x 2)     Status: None   Collection Time: 01/02/22  1:27 AM   Specimen: BLOOD  Result Value Ref Range Status   Specimen Description BLOOD SITE NOT SPECIFIED  Final   Special Requests   Final    AEROBIC BOTTLE ONLY Blood Culture results may not be optimal due to an inadequate volume of blood received in culture bottles   Culture   Final    NO GROWTH 5 DAYS Performed at [facility], [place] [address] [place], [place] [address]    Report Status 01/07/2022 FINAL  Final  Blood Culture (routine x 2)     Status: None (Preliminary result)   Collection Time: 01/07/22  5:59 AM   Specimen: Site Not Specified; Blood  Result Value Ref Range Status   Specimen Description   Final    SITE NOT SPECIFIED Performed at [facility] [address], [place], [place] [address]    Special Requests   Final    BOTTLES DRAWN AEROBIC AND ANAEROBIC Blood Culture adequate volume Performed at [facility] [address] [place], [place] [address]     Culture   Final    NO  GROWTH 3 DAYS Performed at [facility], [place] [address], [place], [place] [address]    Report Status PENDING  Incomplete  Urine Culture     Status: None   Collection Time: 01/07/22  5:59 AM   Specimen: In/Out Cath Urine  Result Value Ref Range Status   Specimen Description   Final    IN/OUT CATH URINE Performed at [facility] [address], [place], [place] [address]    Special Requests   Final    NONE Performed at [facility], [place] [address], [place], [place] [address]    Culture   Final    NO GROWTH Performed at [facility], [place] [address] [place], [place] [address]    Report Status 01/09/2022 FINAL  Final  Resp Panel by RT-PCR (Flu A&B, Covid) Nasopharyngeal Swab     Status: None   Collection Time: 01/07/22  8:38 AM   Specimen: Nasopharyngeal Swab; Nasopharyngeal(NP) swabs in vial transport medium  Result Value Ref Range Status   SARS Coronavirus 2 by RT PCR NEGATIVE NEGATIVE Final    Comment: (NOTE) SARS-CoV-2 target nucleic acids are NOT DETECTED.  The SARS-CoV-2 RNA is generally detectable in upper respiratory specimens during the acute phase of infection. The lowest concentration of SARS-CoV-2 viral copies this assay can detect is 138 copies/mL. A negative result does not preclude SARS-Cov-2 infection and should not be used as the sole basis for treatment or other patient management decisions. A negative result may occur with  improper specimen collection/handling, submission of specimen other than nasopharyngeal swab, presence of viral mutation(s) within the areas targeted by this assay, and inadequate number of viral copies(<138 copies/mL). A negative result must be combined with clinical observations, patient history, and epidemiological information. The expected result is Negative.  Fact Sheet for Patients:  EntrepreneurPulse.com.au  Fact Sheet for Healthcare  Providers:  IncredibleEmployment.be  This test is no t yet approved or cleared by the [place] FDA and  has been authorized for detection and/or diagnosis of SARS-CoV-2 by FDA under an Emergency Use Authorization (EUA). This EUA will remain  in effect (meaning this test can be used) for the duration of the COVID-19 declaration under Section 564(b)(1) of the Act, 21 U.S.C.section 360bbb-3(b)(1), unless the authorization is terminated  or revoked sooner.       Influenza A by PCR NEGATIVE NEGATIVE Final   Influenza B by PCR NEGATIVE NEGATIVE Final    Comment: (NOTE) The Xpert Xpress SARS-CoV-2/FLU/RSV plus assay is intended as an aid in the diagnosis of influenza from Nasopharyngeal swab specimens and should not be used as a sole basis for treatment. Nasal washings and aspirates are unacceptable for Xpert Xpress SARS-CoV-2/FLU/RSV testing.  Fact Sheet for Patients: EntrepreneurPulse.com.au  Fact Sheet for Healthcare Providers: IncredibleEmployment.be  This test is not yet approved or cleared by the [place] FDA and has been authorized for detection and/or diagnosis of SARS-CoV-2 by FDA under an Emergency Use Authorization (EUA). This EUA will remain in effect (meaning this test can be used) for the duration of the COVID-19 declaration under Section 564(b)(1) of the Act, 21 U.S.C. section 360bbb-3(b)(1), unless the authorization is terminated or revoked.  Performed at [facility] , [place] [address], [place], [place] [address]   Blood Culture (routine x 2)     Status: None (Preliminary result)   Collection Time: 01/07/22  5:17 PM   Specimen: BLOOD  Result Value Ref Range Status   Specimen Description  Final    BLOOD RIGHT ANTECUBITAL Performed at [facility] [address], [place], [place] [address]    Special Requests   Final    BOTTLES DRAWN AEROBIC ONLY Blood Culture  results may not be optimal due to an inadequate volume of blood received in culture bottles Performed at [facility] [address] [place], [place] [address]    Culture   Final    NO GROWTH 3 DAYS Performed at [facility], [place] [address], [place], [place] [address]    Report Status PENDING  Incomplete         Radiology Studies: No results found.  Scheduled Meds:  sodium chloride   Intravenous Once   calcium carbonate  1 tablet Oral BID WC   chlorhexidine  15 mL Mouth Rinse BID   insulin aspart  0-20 Units Subcutaneous TID WC   insulin aspart  0-5 Units Subcutaneous QHS   mouth rinse  15 mL Mouth Rinse q12n4p    morphine injection  2 mg Intravenous Once   pantoprazole (PROTONIX) IV  40 mg Intravenous Q12H   Continuous Infusions:  sodium chloride Stopped (01/11/22 0955)   sodium chloride Stopped (01/08/22 1220)   ceFEPime (MAXIPIME) IV 2 g (01/11/22 0650)   heparin 1,350 Units/hr (01/10/22 2225)   metronidazole 500 mg (01/11/22 0526)   potassium PHOSPHATE IVPB (in mmol) 30 mmol (01/11/22 0951)   promethazine (PHENERGAN) injection (IM or IVPB)       LOS: 4 days    Time spent: 35 mins    Kert Shackett, MD [facility]   If 7PM-7AM, please contact night-coverage

## 2022-01-11 NOTE — Progress Notes (Signed)
[place] for heparin Indication: atrial fibrillation- bridge therapy while PTA apixaban held  Allergies  Allergen Reactions   Penicillins Shortness Of Breath, Itching and Swelling    Has patient had a PCN reaction causing immediate rash, facial/tongue/throat swelling, SOB or lightheadedness with hypotension: Yes Has patient had a PCN reaction causing severe rash involving mucus membranes or skin necrosis:No Has patient had a PCN reaction that required hospitalization: No Has patient had a PCN reaction occurring within the last 10 years: no If all of the above answers are "NO", then may proceed with Cephalosporin use.  *Per patient, tolerated Keflex in the past      Patient Measurements: Height: 5\' 8"  (172.7 cm) Weight: 136 kg (299 lb 13.2 oz) IBW/kg (Calculated) : 63.9 Heparin Dosing Weight: 96.7 kg  Vital Signs: Temp: 98.1 F (36.7 C) (02/04 2008) Temp Source: Oral (02/04 2008) BP: 117/87 (02/04 2008) Pulse Rate: 78 (02/04 2008)  Labs: Recent Labs    01/08/22 0457 01/08/22 0816 01/09/22 0043 01/09/22 0812 01/09/22 1653 01/10/22 0555 01/10/22 1415 01/11/22 0019  HGB 7.2*   < > 6.1*   < >  --  7.6* 8.2* 7.7*  HCT 22.3*   < > 20.0*   < >  --  24.5* 25.9* 24.9*  PLT 204  --  177  --   --  194  --  219  APTT 93*  --  93*  --  106* 136*  --   --   LABPROT 19.8*  --   --   --   --   --   --   --   INR 1.7*  --   --   --   --   --   --   --   HEPARINUNFRC >1.10*  --  1.09*  --   --  0.72* 0.45 0.44  CREATININE 1.90*  --  1.52*  --   --  1.16*  --   --    < > = values in this interval not displayed.     Estimated Creatinine Clearance: 76.4 mL/min (A) (by C-G formula based on SCr of 1.16 mg/dL (H)).  Assessment: 61 yo F on apixaban 5 mg po BID PTA for PAF, admitted for gastric outlet obstruction. Pharmacy to dose heparin for bridge therapy while apixaban held. Last dose of apixaban 5 mg was 1/30 at 9 am  Today, 01/11/2022 Heparin  level 0.44- therapeutic/stable on IV heparin 1350 units/hr CBC: Hgb 7.7- low/stable after 1 unit PRBC on 2/3; Plts WNL/stable No bleeding or infusion issues per RN  Goal of Therapy:  Heparin level 0.3-0.7 units/ml Monitor platelets by anticoagulation protocol: Yes   Plan:  Continue IV heparin infusion at 1350 units/hr  Daily CBC and heparin level Monitor closely for s/sx of bleeding  Netta Cedars, PharmD, BCPS [phone] 01/10/2022, 8:47 AM

## 2022-01-12 ENCOUNTER — Inpatient Hospital Stay (HOSPITAL_COMMUNITY): Payer: 59

## 2022-01-12 DIAGNOSIS — Z7189 Other specified counseling: Secondary | ICD-10-CM | POA: Diagnosis not present

## 2022-01-12 DIAGNOSIS — R531 Weakness: Secondary | ICD-10-CM | POA: Diagnosis not present

## 2022-01-12 DIAGNOSIS — K311 Adult hypertrophic pyloric stenosis: Secondary | ICD-10-CM | POA: Diagnosis not present

## 2022-01-12 LAB — BASIC METABOLIC PANEL
Anion gap: 7 (ref 5–15)
BUN: 14 mg/dL (ref 6–20)
CO2: 29 mmol/L (ref 22–32)
Calcium: 7.6 mg/dL — ABNORMAL LOW (ref 8.9–10.3)
Chloride: 103 mmol/L (ref 98–111)
Creatinine, Ser: 0.93 mg/dL (ref 0.44–1.00)
GFR, Estimated: >60 mL/min (ref >60)
Glucose, Bld: 184 mg/dL — ABNORMAL HIGH (ref 70–99)
Potassium: 3.9 mmol/L (ref 3.5–5.1)
Sodium: 139 mmol/L (ref 135–145)

## 2022-01-12 LAB — CULTURE, BLOOD (ROUTINE X 2)
Culture: NO GROWTH
Culture: NO GROWTH
Special Requests: ADEQUATE

## 2022-01-12 LAB — CBC
HCT: 28 % — ABNORMAL LOW (ref 36.0–46.0)
Hemoglobin: 8.5 g/dL — ABNORMAL LOW (ref 12.0–15.0)
MCH: 28.7 pg (ref 26.0–34.0)
MCHC: 30.4 g/dL (ref 30.0–36.0)
MCV: 94.6 fL (ref 80.0–100.0)
Platelets: 276 10*3/uL (ref 150–400)
RBC: 2.96 MIL/uL — ABNORMAL LOW (ref 3.87–5.11)
RDW: 21.2 % — ABNORMAL HIGH (ref 11.5–15.5)
WBC: 14 10*3/uL — ABNORMAL HIGH (ref 4.0–10.5)
nRBC: 0.1 % (ref 0.0–0.2)

## 2022-01-12 LAB — PHOSPHORUS: Phosphorus: 2.8 mg/dL (ref 2.5–4.6)

## 2022-01-12 LAB — GLUCOSE, CAPILLARY
Glucose-Capillary: 144 mg/dL — ABNORMAL HIGH (ref 70–99)
Glucose-Capillary: 156 mg/dL — ABNORMAL HIGH (ref 70–99)
Glucose-Capillary: 162 mg/dL — ABNORMAL HIGH (ref 70–99)
Glucose-Capillary: 164 mg/dL — ABNORMAL HIGH (ref 70–99)
Glucose-Capillary: 188 mg/dL — ABNORMAL HIGH (ref 70–99)
Glucose-Capillary: 210 mg/dL — ABNORMAL HIGH (ref 70–99)

## 2022-01-12 LAB — HEPARIN LEVEL (UNFRACTIONATED): Heparin Unfractionated: 0.47 IU/mL (ref 0.30–0.70)

## 2022-01-12 LAB — MAGNESIUM: Magnesium: 1.7 mg/dL (ref 1.7–2.4)

## 2022-01-12 MED ORDER — APIXABAN 5 MG PO TABS
5.0000 mg | ORAL_TABLET | Freq: Two times a day (BID) | ORAL | Status: DC
  Administered 2022-01-12: 5 mg via ORAL
  Filled 2022-01-12: qty 1

## 2022-01-12 NOTE — TOC Progression Note (Signed)
Transition of Care [facility]) - Progression Note    Patient Details  Name: Leslie Mullen MRN: [id number] Date of Birth: 21-Nov-1961  Transition of Care [facility]) CM/SW Contact  Jerrad Mendibles, Juliann Pulse, RN Phone Number: 01/12/2022, 10:46 AM  Clinical Narrative: Referral for home w/hospice-will f/u with patient/family.      Expected Discharge Plan: Home w Hospice Care Barriers to Discharge: Continued Medical Work up  Expected Discharge Plan and Services Expected Discharge Plan: [place] Acute Care Choice: NA Living arrangements for the past 2 months: Single Family Home                                       Social Determinants of Health (SDOH) Interventions    Readmission Risk Interventions Readmission Risk Prevention Plan 01/08/2022  Transportation Screening Complete  Medication Review [employer]) Complete  PCP or Specialist appointment within 3-5 days of discharge Complete  HRI or [place] Complete  SW Recovery Care/Counseling Consult Complete  [place] Not Applicable  Some recent data might be hidden

## 2022-01-12 NOTE — Progress Notes (Signed)
IR received phone call from  Dr. Burr Medico this afternoon requesting evaluation for possible venting g-tube with j-extension. Request sent to IR MD for review. IR will notify team if this request is approved.  Soyla Dryer, [place] [phone] 01/12/2022, 4:42 PM

## 2022-01-12 NOTE — Progress Notes (Signed)
[place] for heparin Indication: atrial fibrillation- bridge therapy while PTA apixaban held  Allergies  Allergen Reactions   Penicillins Shortness Of Breath, Itching and Swelling    Has patient had a PCN reaction causing immediate rash, facial/tongue/throat swelling, SOB or lightheadedness with hypotension: Yes Has patient had a PCN reaction causing severe rash involving mucus membranes or skin necrosis:No Has patient had a PCN reaction that required hospitalization: No Has patient had a PCN reaction occurring within the last 10 years: no If all of the above answers are "NO", then may proceed with Cephalosporin use.  *Per patient, tolerated Keflex in the past      Patient Measurements: Height: 5\' 8"  (172.7 cm) Weight: 136 kg (299 lb 13.2 oz) IBW/kg (Calculated) : 63.9 Heparin Dosing Weight: 96.7 kg  Vital Signs: Temp: 97.9 F (36.6 C) (02/06 0426) Temp Source: Oral (02/06 0426) BP: 139/70 (02/06 0426) Pulse Rate: 77 (02/06 0426)  Labs: Recent Labs    01/09/22 0812 01/09/22 1653 01/10/22 0555 01/10/22 1415 01/11/22 0019 01/12/22 0442  HGB  --   --  7.6* 8.2* 7.7* 8.5*  HCT  --   --  24.5* 25.9* 24.9* 28.0*  PLT  --   --  194  --  219 276  APTT  --  106* 136*  --   --   --   HEPARINUNFRC   < >  --  0.72* 0.45 0.44 0.47  CREATININE  --   --  1.16*  --  0.96 0.93   < > = values in this interval not displayed.     Estimated Creatinine Clearance: 95.3 mL/min (by C-G formula based on SCr of 0.93 mg/dL).  Assessment: 61 yo F on apixaban 5 mg po BID PTA for PAF, admitted for gastric outlet obstruction. Pharmacy to dose heparin for bridge therapy while apixaban held. Last dose of apixaban 5 mg was 1/30 at 9 am  Today, 01/12/2022 Heparin level 0.47- therapeutic/stable on IV heparin 1350 units/hr CBC: Hgb 8.5- low/stable after 1 unit PRBC on 2/3; Plts WNL/stable No bleeding or infusion issues documented  Goal of Therapy:  Heparin  level 0.3-0.7 units/ml Monitor platelets by anticoagulation protocol: Yes   Plan:  Continue IV heparin infusion at 1350 units/hr  Daily CBC and heparin level Monitor closely for s/sx of bleeding Follow up plans for transition back to [facility], PharmD, [place]: [phone] 01/10/2022, 8:47 AM

## 2022-01-12 NOTE — Progress Notes (Addendum)
Daily Progress Note   Patient Name: Leslie Mullen       Date: 01/12/2022 DOB: [birth year]  Age: 61 y.o. MRN#: [id number] Attending Physician: Shawna Clamp, MD Primary Care Physician: Elinor Parkinson Admit Date: 01/07/2022  Reason for Consultation/Follow-up: Establishing goals of care, Hospice Evaluation, Non pain symptom management, and Pain control  Subjective: Patient is resting in bed. Currently asleep, husband at bedside. I discussed him briefly.   Requiring IV Dilaudid for pain control.   Length of Stay: 5  Current Medications: Scheduled Meds:   sodium chloride   Intravenous Once   apixaban  5 mg Oral BID   calcium carbonate  1 tablet Oral BID WC   chlorhexidine  15 mL Mouth Rinse BID   insulin aspart  0-20 Units Subcutaneous TID WC   insulin aspart  0-5 Units Subcutaneous QHS   mouth rinse  15 mL Mouth Rinse q12n4p    morphine injection  2 mg Intravenous Once   pantoprazole (PROTONIX) IV  40 mg Intravenous Q12H    Continuous Infusions:  sodium chloride 100 mL/hr at 01/12/22 0559   sodium chloride Stopped (01/08/22 1220)   heparin 1,350 Units/hr (01/11/22 1838)   promethazine (PHENERGAN) injection (IM or IVPB)      PRN Meds: sodium chloride, docusate sodium, HYDROmorphone (DILAUDID) injection, metoprolol tartrate, prochlorperazine, promethazine (PHENERGAN) injection (IM or IVPB)  Physical Exam         Patient appears deconditioned  Regular work of breathing Asleep currently Has edema   Vital Signs: BP 133/78 (BP Location: Left Arm)    Pulse 82    Temp 98 F (36.7 C) (Oral)    Resp 20    Ht 5\' 8"  (1.727 m)    Wt 136 kg    SpO2 100%    BMI 45.59 kg/m  SpO2: SpO2: 100 % O2 Device: O2 Device: Nasal Cannula O2 Flow Rate: O2 Flow Rate (L/min): 2  L/min  Intake/output summary:  Intake/Output Summary (Last 24 hours) at 01/12/2022 1601 Last data filed at 01/12/2022 1147 Gross per 24 hour  Intake --  Output 700 ml  Net -700 ml    LBM: Last BM Date: 01/09/22 Baseline Weight: Weight: (!) 137.8 kg Most recent weight: Weight: 136 kg       Palliative Assessment/Data:      Patient  Active Problem List   Diagnosis Date Noted   Palliative care by specialist    Goals of care, counseling/discussion    General weakness    Gastric outlet obstruction 01/07/2022   Sepsis ([place]) 01/02/2022   Dizziness 01/01/2022   SIRS (systemic inflammatory response syndrome) (HCC) 01/01/2022   Lactic acidosis 01/01/2022   Acute GI bleeding 12/18/2021   Symptomatic anemia 12/16/2021   Stage 3a chronic kidney disease (CKD) ([place]) 12/16/2021   Obesity, Class III, BMI 40-49.9 (morbid obesity) ([place]) 12/16/2021   Hyperlipidemia 12/16/2021   Anxiety 12/16/2021   Chronic pain 12/16/2021   Complete heart block ([place]) 12/16/2021   Port-A-Cath in place 11/18/2021   Family history of colon cancer 10/20/2021   Pancreatic cancer metastasized to liver ([place]) 10/14/2021   Pancreatic mass    Pancreatitis 10/05/2021   Leukocytosis 10/05/2021   AKI (acute kidney injury) ([place]) 10/05/2021   Chronic anticoagulation 10/05/2021   Thrombocytosis 10/05/2021   Paroxysmal atrial fibrillation ([place]) 09/18/2021   Secondary hypercoagulable state ([place]) 09/18/2021   Endometrial cancer ([place]) 07/29/2021   Heart block 10/10/2020   Fall at home, initial encounter 10/10/2020   Type 2 diabetes mellitus with diabetic polyneuropathy, with long-term current use of insulin ([place]) 01/31/2020   Type 2 diabetes mellitus with hypoglycemia ([place]) 01/31/2020   Type 2 diabetes mellitus with stage 3a chronic kidney disease, with long-term current use of insulin ([place]) 01/31/2020   Abnormal feces    Benign neoplasm of ascending colon    Benign neoplasm of cecum    Benign neoplasm of transverse  colon     Palliative Care Assessment & Plan   Patient Profile:    Assessment: Palliative performance scale 40%. 61 year old lady with life limiting illness of metastatic pancreatic cancer, left lower lobe pneumonia, metastatic disease burden to liver, admitted with gastric outlet obstruction secondary to pancreatic head lesion, status post stenting.  GI colleagues following closely and patient's diet was gradually being advanced.  Recommendations/Plan: I checked in with the patient's husband at bedside, also briefly corresponded with Dr Burr Medico Oncology  and Dr Dwyane Dee [facility] MD.  Continue current pain management, monitor PO intake.  Consideration to be given to the possibility of G tube for venting and J tube for nutrition. Would recommend home with hospice afterwards.  CODE STATUS discussed, full code for now, patient and family to discuss further.      Code Status:    Code Status Orders  (From admission, onward)           Start     Ordered   01/07/22 1200  Full code  Continuous        01/07/22 1159           Code Status History     Date Active Date Inactive Code Status Order ID Comments User Context   01/01/2022 1149 01/05/2022 2054 Full Code [id number]  Norval Morton, MD ED   12/19/2021 1027 12/20/2021 1944 Full Code [id number]  Little Ishikawa, MD Inpatient   12/16/2021 1819 12/19/2021 1027 DNR [id number]  Mariel Aloe, MD Inpatient   10/05/2021 1053 10/09/2021 2244 Full Code [id number]  Norval Morton, MD ED   07/29/2021 0518 07/29/2021 1422 Full Code [id number]  Dorothyann Gibbs, NP Inpatient   10/10/2020 1620 10/12/2020 1902 Full Code [id number]  Shirley Friar, PA-C ED   10/10/2020 1009 10/10/2020 1128 Full Code [id number]  Dorothyann Gibbs, NP Inpatient       Prognosis:  Guarded   Discharge Planning:  To Be Determined  Care plan was discussed with  her husband.   Thank you for allowing the Palliative Medicine Team to assist in the care of this  patient.   Time In: 1500 Time Out: 1525 Total Time 25 Prolonged Time Billed  no       Greater than 50%  of this time was spent counseling and coordinating care related to the above assessment and plan.  Loistine Chance, MD  Please contact Palliative Medicine Team phone at [phone] for questions and concerns.

## 2022-01-12 NOTE — Progress Notes (Signed)
PROGRESS NOTE    Leslie Mullen  YCX:[id number] DOB: [birth year] DOA: 01/07/2022 PCP: Sue Lush, PA-C    Brief Narrative: This 61 years old female with PMH significant for CHB, s/p PPM, DM 2, hypertension, metastatic pancreatic cancer, endometrial cancer, morbid obesity presented in the ED with complaints of nausea and vomiting.  She was recently hospitalized from 1/26 - 1/30 for sepsis secondary to port infection.  At that time she was sent home with 5 days of oral doxycycline.  Blood cultures were negative.  Patient has acutely developed nausea and vomiting,  She was unable to take anything down she has tried Compazine and antacids did not help.  She was feeling dizzy by end of day with worsening nausea and vomiting. Patient is admitted for sepsis secondary to pancreatic infection versus left lower lobe pneumonia. She is also found to have gastric outlet obstruction, general surgery, gastroenterology, oncology, palliative care consulted.  She underwent EGD with stent, nausea has improved.  Now she has recurrence of nausea and abdominal pain.  Assessment & Plan:   Principal Problem:   Gastric outlet obstruction Active Problems:   Type 2 diabetes mellitus with diabetic polyneuropathy, with long-term current use of insulin (HCC)   Paroxysmal atrial fibrillation (HCC)   AKI (acute kidney injury) ([place])   Pancreatic cancer metastasized to liver ([place])   Sepsis ([facility])   Palliative care by specialist   Goals of care, counseling/discussion   General weakness  Sepsis sec. to pancreatic infection versus left lower lobe pneumonia: Initiated IV antibiotics (cefepime,  Flagyl) Continue IV fluids.  Continue supportive care.  Lactic acid normalized with IV hydration, back to 2.1 again. Procalcitonin 0.67>0.26.  Blood cultures no growth to date. Sepsis physiology resolved.  Completed course of antibiotics for 5 days.  Gastric outlet obstruction: Pancreatic cancer with mets to the liver: Patient  with metastatic pancreatic cancer followed by Dr. Annamaria Boots on palliative chemotherapy presented with nausea, vomiting and found to have gastric outlet obstruction secondary to pancreatic head lesion.  Continued NG tube decompression.  She was kept NPO. Patient had poor prognosis and poor tolerance to chemotherapy.  She is not a candidate for more cytotoxic therapy.   Goals of care at this point is palliative and comfort. GI consulted, EGD attempted with possible stent placement.  She feels improved.  NG tube removed. Patient is now tolerating clear liquid diet, without nausea and vomiting. GI recommended multiple small meals, plenty of fluids, antireflux measures. Patient tolerated clear liquid diet,  advanced to low residue diet  but has recurrence of Nausea and abdominal pain. Discussed with GI, obtain x-ray to see the patency of stent.  Diabetes mellitus type 2: Continue sliding scale.  AKI: > Resolved. Could be due to dehydration, Dec. PO intake. Serum creatinine normalized with IV hydration. Avoid nephrotoxic medications CT abdomen without obstruction.  Essential hypertension: Hydralazine as needed. Resume home medication once patient is able to take p.o.  Paroxysmal A-fib: Heart rate controlled.  Continue heparin GTT. Resume Eliquis today.  Hypokalemia/hypophosphatemia: Replaced.  Continue to monitor.  Normochromic normocytic anemia: Her hemoglobin has dropped from 8.0-7.4>6.1 Transfuse 1 PRBC, posttrans Hb 7.8>7.6>8.2>7.7>8.5 Continue to monitor H&H.  DVT prophylaxis: Heparin gtt Code Status: Full code. Family Communication: No family at bed side. Disposition Plan:   Status is: Inpatient Remains inpatient appropriate because: Admitted for sepsis and gastric outlet obstruction in the setting of pancreatic metastatic carcinoma.  General surgery GI and oncology consulted.  Patient underwent EGD with stenting.  Tolerated well,  She is now tolerating clear liquid  diet.  Patient is medically clear for discharge: No  Anticipated discharge home in 1 to 2 days.  Consultants:  Oncology General surgery Gastroenterology Palliative care  Procedures: Gastrostomy tube  antimicrobials:   Anti-infectives (From admission, onward)    Start     Dose/Rate Route Frequency Ordered Stop   01/10/22 1500  ceFEPIme (MAXIPIME) 2 g in sodium chloride 0.9 % 100 mL IVPB  Status:  Discontinued        2 g 200 mL/hr over 30 Minutes Intravenous Every 8 hours 01/10/22 0926 01/12/22 0923   01/07/22 2000  ceFEPIme (MAXIPIME) 2 g in sodium chloride 0.9 % 100 mL IVPB  Status:  Discontinued        2 g 200 mL/hr over 30 Minutes Intravenous Every 12 hours 01/07/22 0935 01/10/22 0926   01/07/22 1800  metroNIDAZOLE (FLAGYL) IVPB 500 mg  Status:  Discontinued        500 mg 100 mL/hr over 60 Minutes Intravenous Every 12 hours 01/07/22 1159 01/12/22 0923   01/07/22 0645  ceFEPIme (MAXIPIME) 2 g in sodium chloride 0.9 % 100 mL IVPB        2 g 200 mL/hr over 30 Minutes Intravenous  Once 01/07/22 0641 01/07/22 0822   01/07/22 0645  metroNIDAZOLE (FLAGYL) IVPB 500 mg        500 mg 100 mL/hr over 60 Minutes Intravenous  Once 01/07/22 0641 01/07/22 0757        Subjective: Patient was seen and examined at bedside.  Overnight events noted.   Patient underwent EGD with stenting, tolerated well. She still remains nauseous and complains of pain, asking for more pain medication She is able to pass flatus.   Objective: Vitals:   01/11/22 1206 01/11/22 2041 01/12/22 0426 01/12/22 1134  BP: (!) 147/81 109/64 139/70 133/78  Pulse: 79 79 77 82  Resp: 18 16 16 20   Temp: 97.9 F (36.6 C) 98.1 F (36.7 C) 97.9 F (36.6 C) 98 F (36.7 C)  TempSrc: Oral Oral Oral Oral  SpO2: 98% 96% 99% 100%  Weight:      Height:        Intake/Output Summary (Last 24 hours) at 01/12/2022 1327 Last data filed at 01/12/2022 1147 Gross per 24 hour  Intake 959.3 ml  Output 700 ml  Net 259.3 ml    Filed Weights   01/07/22 0500 01/07/22 0502 01/08/22 1229  Weight: (!) 137.8 kg 136.1 kg 136 kg    Examination:  General exam: Appears comfortable, deconditioned, not in any distress. Respiratory system: Clear to auscultation bilaterally, respiratory normal, RR 15 Cardiovascular system: S1 & S2 heard, regular rate and rhythm, no murmur. Gastrointestinal system: Abdomen is soft,  mildly tender, non distended, BS+ Central nervous system: Alert and oriented x 3. No focal neurological deficits. Extremities: Bilateral pedal edema, no cyanosis no clubbing. Skin: No rashes, lesions or ulcers Psychiatry: Judgement and insight appear normal. Mood & affect appropriate.     Data Reviewed: I have personally reviewed following labs and imaging studies  CBC: Recent Labs  Lab 01/08/22 0457 01/08/22 0816 01/09/22 0043 01/09/22 0812 01/10/22 0555 01/10/22 1415 01/11/22 0019 01/12/22 0442  WBC 15.2*  --  10.9*  --  10.4  --  11.0* 14.0*  HGB 7.2*   < > 6.1* 7.8* 7.6* 8.2* 7.7* 8.5*  HCT 22.3*   < > 20.0* 24.3* 24.5* 25.9* 24.9* 28.0*  MCV 91.4  --  94.8  --  90.7  --  94.0 94.6  PLT 204  --  177  --  194  --  219 276   < > = values in this interval not displayed.   Basic Metabolic Panel: Recent Labs  Lab 01/08/22 0457 01/09/22 0043 01/10/22 0555 01/11/22 0019 01/12/22 0442  NA 142 139 136 136 139  K 2.9* 2.8* 2.9* 3.1* 3.9  CL 95* 97* 95* 99 103  CO2 35* 36* 31 31 29   GLUCOSE 215* 251* 193* 171* 184*  BUN 33* 30* 19 17 14   CREATININE 1.90* 1.52* 1.16* 0.96 0.93  CALCIUM 7.7* 7.0* 6.8* 6.9* 7.6*  MG  --  2.0  --  1.7 1.7  PHOS  --  3.2  --  2.0* 2.8   GFR: Estimated Creatinine Clearance: 95.3 mL/min (by C-G formula based on SCr of 0.93 mg/dL). Liver Function Tests: Recent Labs  Lab 01/07/22 0558 01/08/22 0457  AST 17 14*  ALT 13 10  ALKPHOS 86 57  BILITOT 0.8 0.9  PROT 8.2* 5.4*  ALBUMIN 2.3* 1.9*   Recent Labs  Lab 01/07/22 0558  LIPASE 43   No results  for input(s): AMMONIA in the last 168 hours. Coagulation Profile: Recent Labs  Lab 01/07/22 0558 01/08/22 0457  INR 1.6* 1.7*   Cardiac Enzymes: No results for input(s): CKTOTAL, CKMB, CKMBINDEX, TROPONINI in the last 168 hours.  BNP (last 3 results) No results for input(s): PROBNP in the last 8760 hours. HbA1C: No results for input(s): HGBA1C in the last 72 hours.  CBG: Recent Labs  Lab 01/11/22 2042 01/12/22 0006 01/12/22 0428 01/12/22 0726 01/12/22 1131  GLUCAP 142* 144* 164* 162* 188*   Lipid Profile: No results for input(s): CHOL, HDL, LDLCALC, TRIG, CHOLHDL, LDLDIRECT in the last 72 hours. Thyroid Function Tests: No results for input(s): TSH, T4TOTAL, FREET4, T3FREE, THYROIDAB in the last 72 hours. Anemia Panel: No results for input(s): VITAMINB12, FOLATE, FERRITIN, TIBC, IRON, RETICCTPCT in the last 72 hours. Sepsis Labs: Recent Labs  Lab 01/07/22 0556 01/07/22 0838 01/07/22 1240 01/07/22 1717 01/08/22 0457  PROCALCITON  --   --   --   --  0.26  LATICACIDVEN 2.3* 2.5* 1.6 2.1*  --     Recent Results (from the past 240 hour(s))  Blood Culture (routine x 2)     Status: None   Collection Time: 01/07/22  5:59 AM   Specimen: Site Not Specified; Blood  Result Value Ref Range Status   Specimen Description   Final    SITE NOT SPECIFIED Performed at [facility] [address] [place], [place] [address]    Special Requests   Final    BOTTLES DRAWN AEROBIC AND ANAEROBIC Blood Culture adequate volume Performed at [facility] [address], [place], [place] [address]    Culture   Final    NO GROWTH 5 DAYS Performed at [facility], [place] [address] [place], [place] [address]    Report Status 01/12/2022 FINAL  Final  Urine Culture     Status: None   Collection Time: 01/07/22  5:59 AM   Specimen: In/Out Cath Urine  Result Value Ref Range Status   Specimen Description   Final    IN/OUT CATH URINE Performed at  [facility] [address], [place], [place] [address]    Special Requests   Final    NONE Performed at [facility], [place] [address], [place], [place] [address]    Culture  Final    NO GROWTH Performed at [facility], [place] [address], [place], [place] [address]    Report Status 01/09/2022 FINAL  Final  Resp Panel by RT-PCR (Flu A&B, Covid) Nasopharyngeal Swab     Status: None   Collection Time: 01/07/22  8:38 AM   Specimen: Nasopharyngeal Swab; Nasopharyngeal(NP) swabs in vial transport medium  Result Value Ref Range Status   SARS Coronavirus 2 by RT PCR NEGATIVE NEGATIVE Final    Comment: (NOTE) SARS-CoV-2 target nucleic acids are NOT DETECTED.  The SARS-CoV-2 RNA is generally detectable in upper respiratory specimens during the acute phase of infection. The lowest concentration of SARS-CoV-2 viral copies this assay can detect is 138 copies/mL. A negative result does not preclude SARS-Cov-2 infection and should not be used as the sole basis for treatment or other patient management decisions. A negative result may occur with  improper specimen collection/handling, submission of specimen other than nasopharyngeal swab, presence of viral mutation(s) within the areas targeted by this assay, and inadequate number of viral copies(<138 copies/mL). A negative result must be combined with clinical observations, patient history, and epidemiological information. The expected result is Negative.  Fact Sheet for Patients:  EntrepreneurPulse.com.au  Fact Sheet for Healthcare Providers:  IncredibleEmployment.be  This test is no t yet approved or cleared by the [place] FDA and  has been authorized for detection and/or diagnosis of SARS-CoV-2 by FDA under an Emergency Use Authorization (EUA). This EUA will remain  in effect (meaning this test can be used) for the duration of the COVID-19 declaration  under Section 564(b)(1) of the Act, 21 U.S.C.section 360bbb-3(b)(1), unless the authorization is terminated  or revoked sooner.       Influenza A by PCR NEGATIVE NEGATIVE Final   Influenza B by PCR NEGATIVE NEGATIVE Final    Comment: (NOTE) The Xpert Xpress SARS-CoV-2/FLU/RSV plus assay is intended as an aid in the diagnosis of influenza from Nasopharyngeal swab specimens and should not be used as a sole basis for treatment. Nasal washings and aspirates are unacceptable for Xpert Xpress SARS-CoV-2/FLU/RSV testing.  Fact Sheet for Patients: EntrepreneurPulse.com.au  Fact Sheet for Healthcare Providers: IncredibleEmployment.be  This test is not yet approved or cleared by the [place] FDA and has been authorized for detection and/or diagnosis of SARS-CoV-2 by FDA under an Emergency Use Authorization (EUA). This EUA will remain in effect (meaning this test can be used) for the duration of the COVID-19 declaration under Section 564(b)(1) of the Act, 21 U.S.C. section 360bbb-3(b)(1), unless the authorization is terminated or revoked.  Performed at [facility], [place] [address], [place], [place] [address]   Blood Culture (routine x 2)     Status: None   Collection Time: 01/07/22  5:17 PM   Specimen: BLOOD  Result Value Ref Range Status   Specimen Description   Final    BLOOD RIGHT ANTECUBITAL Performed at [facility] [address] [place], [place] [address]    Special Requests   Final    BOTTLES DRAWN AEROBIC ONLY Blood Culture results may not be optimal due to an inadequate volume of blood received in culture bottles Performed at [facility] [address], [place], [place] [address]    Culture   Final    NO GROWTH 5 DAYS Performed at [facility], [place] [address], [place], [place] [address]    Report Status 01/12/2022 FINAL  Final    Radiology Studies: No results  found.  Scheduled Meds:  sodium chloride   Intravenous Once   calcium carbonate  1 tablet Oral BID WC   chlorhexidine  15 mL Mouth Rinse BID   insulin aspart  0-20 Units Subcutaneous TID WC   insulin aspart  0-5 Units Subcutaneous QHS   mouth rinse  15 mL Mouth Rinse q12n4p    morphine injection  2 mg Intravenous Once   pantoprazole (PROTONIX) IV  40 mg Intravenous Q12H   Continuous Infusions:  sodium chloride 100 mL/hr at 01/12/22 0559   sodium chloride Stopped (01/08/22 1220)   heparin 1,350 Units/hr (01/11/22 1838)   promethazine (PHENERGAN) injection (IM or IVPB)       LOS: 5 days    Time spent: 35 mins    Vaani Morren, MD [facility]   If 7PM-7AM, please contact night-coverage

## 2022-01-12 NOTE — Progress Notes (Signed)
Patient ID: Leslie Mullen, female   DOB: 1961-04-29, 61 y.o.   MRN: [id number]  [facility] Gastroenterology Progress Note  Leslie Mullen 61 y.o. September 12, 1961   Subjective: Dry heaving today. Vomiting yesterday with pudding. Tolerating liquids today. Complaining of abdominal pain.  Objective: Vital signs: Vitals:   01/12/22 0426 01/12/22 1134  BP: 139/70 133/78  Pulse: 77 82  Resp: 16 20  Temp: 97.9 F (36.6 C) 98 F (36.7 C)  SpO2: 99% 100%    Physical Exam: Gen: lethargic, obese, pleasant, no acute distress  HEENT: anicteric sclera CV: RRR Chest: CTA B Abd: diffuse tenderness with guarding, soft, nondistended, +BS, obese Ext: no edema  Lab Results: Recent Labs    01/11/22 0019 01/12/22 0442  NA 136 139  K 3.1* 3.9  CL 99 103  CO2 31 29  GLUCOSE 171* 184*  BUN 17 14  CREATININE 0.96 0.93  CALCIUM 6.9* 7.6*  MG 1.7 1.7  PHOS 2.0* 2.8   No results for input(s): AST, ALT, ALKPHOS, BILITOT, PROT, ALBUMIN in the last 72 hours. Recent Labs    01/11/22 0019 01/12/22 0442  WBC 11.0* 14.0*  HGB 7.7* 8.5*  HCT 24.9* 28.0*  MCV 94.0 94.6  PLT 219 276      Assessment/Plan: Gastric outlet obstruction due to metastatic pancreatic cancer - s/p duodenal stent. Xray to evaluate patency of duodenal stent due to recurrent N/V. If stent not functioning, then may need TNA but ideally would want to continue enteral nutrition. Supportive care.   Leslie Mullen 01/12/2022, 2:30 PM  Questions please call [phone]

## 2022-01-12 NOTE — Progress Notes (Signed)
Chaplain engaged in an initial visit with Leslie Mullen and her son.  Chaplain was able to learn about [name]'s healthcare journey, family, and her faith.  Reasnor from [place] but moved to [place] and then to [place] after meeting her husband.  They came together as a blended family of five in 67.  They have been married over 20 years.  Though there have been some challenges in blending and co-parenting, [name] has always been dependent on her faith to get her through.  [name] voiced that she was adopted as an adolescent and through that experience had to learn to lean on God.  She also voiced that her family taught her to really experience God.    [name] spent some time talking about how she is dealing with death.  Though she has been mad at God and questioned God, she finds herself grateful to know God and have a relationship with God.  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  [name]'s favorite scripture that she has depended on is Philipians 4:13.   Chaplain offered support, listening and presence.  Chaplain uplifted and affirmed the many transitions that [name] has been experiencing.  [name] does not find a need for an Advanced Directive at this time.  She shared that her husband does have her best interest at heart.      01/12/22 1200  Clinical Encounter Type  Visited With Patient and family together  Visit Type Initial;Spiritual support  Referral From Palliative care team  Consult/Referral To Chaplain

## 2022-01-13 ENCOUNTER — Inpatient Hospital Stay: Payer: 59

## 2022-01-13 DIAGNOSIS — K311 Adult hypertrophic pyloric stenosis: Secondary | ICD-10-CM | POA: Diagnosis not present

## 2022-01-13 DIAGNOSIS — Z515 Encounter for palliative care: Secondary | ICD-10-CM | POA: Diagnosis not present

## 2022-01-13 DIAGNOSIS — Z7189 Other specified counseling: Secondary | ICD-10-CM | POA: Diagnosis not present

## 2022-01-13 DIAGNOSIS — C259 Malignant neoplasm of pancreas, unspecified: Secondary | ICD-10-CM | POA: Diagnosis not present

## 2022-01-13 LAB — BASIC METABOLIC PANEL
Anion gap: 8 (ref 5–15)
BUN: 11 mg/dL (ref 6–20)
CO2: 26 mmol/L (ref 22–32)
Calcium: 7.6 mg/dL — ABNORMAL LOW (ref 8.9–10.3)
Chloride: 105 mmol/L (ref 98–111)
Creatinine, Ser: 0.89 mg/dL (ref 0.44–1.00)
GFR, Estimated: >60 mL/min (ref >60)
Glucose, Bld: 210 mg/dL — ABNORMAL HIGH (ref 70–99)
Potassium: 3.5 mmol/L (ref 3.5–5.1)
Sodium: 139 mmol/L (ref 135–145)

## 2022-01-13 LAB — TYPE AND SCREEN
ABO/RH(D): B POS
Antibody Screen: NEGATIVE
Unit division: 0
Unit division: 0

## 2022-01-13 LAB — BPAM RBC
Blood Product Expiration Date: 202302232359
Blood Product Expiration Date: 202302232359
ISSUE DATE / TIME: 202302030306
Unit Type and Rh: 7300
Unit Type and Rh: 7300

## 2022-01-13 LAB — HEPARIN LEVEL (UNFRACTIONATED): Heparin Unfractionated: 0.61 IU/mL (ref 0.30–0.70)

## 2022-01-13 LAB — GLUCOSE, CAPILLARY
Glucose-Capillary: 148 mg/dL — ABNORMAL HIGH (ref 70–99)
Glucose-Capillary: 149 mg/dL — ABNORMAL HIGH (ref 70–99)
Glucose-Capillary: 159 mg/dL — ABNORMAL HIGH (ref 70–99)
Glucose-Capillary: 163 mg/dL — ABNORMAL HIGH (ref 70–99)
Glucose-Capillary: 180 mg/dL — ABNORMAL HIGH (ref 70–99)
Glucose-Capillary: 181 mg/dL — ABNORMAL HIGH (ref 70–99)
Glucose-Capillary: 191 mg/dL — ABNORMAL HIGH (ref 70–99)

## 2022-01-13 LAB — CBC
HCT: 24.7 % — ABNORMAL LOW (ref 36.0–46.0)
Hemoglobin: 7.4 g/dL — ABNORMAL LOW (ref 12.0–15.0)
MCH: 28.5 pg (ref 26.0–34.0)
MCHC: 30 g/dL (ref 30.0–36.0)
MCV: 95 fL (ref 80.0–100.0)
Platelets: 247 10*3/uL (ref 150–400)
RBC: 2.6 MIL/uL — ABNORMAL LOW (ref 3.87–5.11)
RDW: 21.6 % — ABNORMAL HIGH (ref 11.5–15.5)
WBC: 13.4 10*3/uL — ABNORMAL HIGH (ref 4.0–10.5)
nRBC: 0 % (ref 0.0–0.2)

## 2022-01-13 LAB — APTT: aPTT: 59 seconds — ABNORMAL HIGH (ref 24–36)

## 2022-01-13 MED ORDER — HEPARIN (PORCINE) 25000 UT/250ML-% IV SOLN
1350.0000 [IU]/h | INTRAVENOUS | Status: DC
  Administered 2022-01-13: 1350 [IU]/h via INTRAVENOUS
  Filled 2022-01-13 (×2): qty 250

## 2022-01-13 MED ORDER — APIXABAN 5 MG PO TABS: 5.0000 mg | ORAL_TABLET | Freq: Two times a day (BID) | ORAL | Status: DC

## 2022-01-13 NOTE — Progress Notes (Signed)
McMullen for Heparin Indication: atrial fibrillation- bridge therapy while PTA apixaban held  Allergies  Allergen Reactions   Penicillins Shortness Of Breath, Itching and Swelling    Has patient had a PCN reaction causing immediate rash, facial/tongue/throat swelling, SOB or lightheadedness with hypotension: Yes Has patient had a PCN reaction causing severe rash involving mucus membranes or skin necrosis:No Has patient had a PCN reaction that required hospitalization: No Has patient had a PCN reaction occurring within the last 10 years: no If all of the above answers are "NO", then may proceed with Cephalosporin use.  *Per patient, tolerated Keflex in the past      Patient Measurements: Height: 5\' 8"  (172.7 cm) Weight: 136 kg (299 lb 13.2 oz) IBW/kg (Calculated) : 63.9 Heparin Dosing Weight: 96.7 kg  Vital Signs: Temp: 98.4 F (36.9 C) (02/07 0348) Temp Source: Oral (02/07 0348) BP: 168/71 (02/07 0348) Pulse Rate: 99 (02/07 0348)  Labs: Recent Labs    01/10/22 1415 01/11/22 0019 01/12/22 0442 01/13/22 0455  HGB 8.2* 7.7* 8.5* 7.4*  HCT 25.9* 24.9* 28.0* 24.7*  PLT  --  219 276 247  HEPARINUNFRC 0.45 0.44 0.47  --   CREATININE  --  0.96 0.93 0.89     Estimated Creatinine Clearance: 99.6 mL/min (by C-G formula based on SCr of 0.89 mg/dL).  Assessment: 61 yo F on apixaban 5 mg po BID PTA for PAF, admitted for gastric outlet obstruction secondary to pancreatic head lesion. Pharmacy to dose heparin for bridge therapy while apixaban held.   Apixaban resumed 2/6 when advanced to clear liquids - one dose given at 18:19.  Now transitioning back to IV heparin in anticipation of G-tube placement by IR, possibly 2/8.  Today, 01/13/2022 CBC: Hgb decreased 7.4, received PRBC on 2/3; Plts WNL No bleeding reported SCr stable 0.89  Goal of Therapy:  Heparin level 0.3-0.7 units/ml Monitor platelets by anticoagulation protocol: Yes    Plan:  Resume IV heparin infusion at 1350 units/hr (heparin level previously therapeutic on this rate) Check aPTT and heparin level 6hrs after starting drip Daily CBC Monitor closely for s/sx of bleeding Follow up timing of IR procedure, plan to hold heparin 4hrs prior to  Peggyann Juba, PharmD, [place]: [phone] 01/10/2022, 8:47 AM

## 2022-01-13 NOTE — Progress Notes (Signed)
Pharmacy: Re-heparin  Patient is a 61 y.o F with hx afib on Eliquis PTA, admitted for gastric outlet obstruction secondary to pancreatic head lesion.  Eliquis resumed back on 2/6, but changed to heparin drip on 2/7 in anticipation for G-tube placement.  - last Eliquis dose taken on 2/6 at 1819 (only got one dose before she was placed back on heparin drip)  - heparin level is therapeutic at 0.61    Goal of Therapy:  Heparin level 0.3-0.7 units/ml Monitor platelets by anticoagulation protocol: Yes    Plan: - continue heparin drip at 1350 units/hr - f/u with AM heparin level and adjust dose if needed  Dia Sitter, PharmD, BCPS 01/13/2022 9:46 PM

## 2022-01-13 NOTE — Progress Notes (Signed)
Referring Physician(s): Feng,Y  Supervising Physician: Jacqulynn Cadet  Patient Status:  [facility] - In-pt  Chief Complaint:  Nausea,vomiting, abdominal pain, gastric outlet obstruction, pancreatic cancer  Subjective: Pt known to IR service from liver lesion bx/port a cath placement in 2022, and port removal on 01/01/22 due to incision dehiscence. She has a PMH sig for CHB, s/p PPM, DM 2, hypertension, metastatic pancreatic cancer, endometrial cancer, morbid obesity presented in the ED with complaints of nausea and vomiting.  She was recently hospitalized from 1/26 - 1/30 for sepsis secondary to port infection. She also has gastric outlet obstruction from met pancreatic cancer.  She underwent EGD on 01/08/22 which revealed:   Moderately severe reflux and erosive esophagitis with no bleeding. - A medium amount of food (residue) in the stomach. - Partially obstructing non-bleeding duodenal ulcer.  - Acquired duodenal stenosis. Prosthesis placed. - Normal second portion of the duodenum. - The examination was otherwise normal.  She cont to have abd discomfort, intermittent N/V Abd film yesterday showed nonspecific bowel gas pattern. Stent in region of duodenum. Request received from Dr. Burr Medico for venting G tube with poss J tube extension prior to d/c to hospice facility. She currently denies fever,HA,CP,worsening dyspnea, back pain or bleeding.    Past Medical History:  Diagnosis Date   Abnormal uterine bleeding    Allergy    Anemia    Asthma    Cataract    BOTH EYES   Complete heart block ([place])    a. [employer] PPM 10/2020.   Diabetes ([place])    type II    Diabetic neuropathy ([place])    Endometrial cancer ([place])    Family history of colon cancer    GERD (gastroesophageal reflux disease)    Hyperlipidemia    Hypertension    Morbid obesity ([place])    Osteoarthritis    right knee    Pancreatitis    Pneumonia    Past Surgical History:  Procedure Laterality Date   CERVICAL BIOPSY      CESAREAN SECTION     x3   COLONOSCOPY WITH PROPOFOL N/A 06/29/2017   Procedure: COLONOSCOPY WITH PROPOFOL;  Surgeon: Irene Shipper, MD;  Location: [facility];  Service: Endoscopy;  Laterality: N/A;   CYSTECTOMY Right 2010   Cyst removed from right groin.    DILATION AND CURETTAGE OF UTERUS N/A 07/29/2021   Procedure: DILATATION AND CURETTAGE OF UTERUS;  Surgeon: Everitt Amber, MD;  Location: [facility];  Service: Gynecology;  Laterality: N/A;   DUODENAL STENT PLACEMENT N/A 01/08/2022   Procedure: DUODENAL STENT PLACEMENT;  Surgeon: Clarene Essex, MD;  Location: [facility];  Service: Endoscopy;  Laterality: N/A;   ESOPHAGOGASTRODUODENOSCOPY N/A 01/08/2022   Procedure: ESOPHAGOGASTRODUODENOSCOPY (EGD);  Surgeon: Clarene Essex, MD;  Location: [facility];  Service: Endoscopy;  Laterality: N/A;   INTRAUTERINE DEVICE (IUD) INSERTION N/A 07/29/2021   Procedure: INTRAUTERINE DEVICE (IUD) INSERTION;  Surgeon: Everitt Amber, MD;  Location: [facility];  Service: Gynecology;  Laterality: N/A;  IUD FROM MAIN PHARMACY   IR IMAGING GUIDED PORT INSERTION  11/11/2021   IR REMOVAL TUN ACCESS W/ PORT W/O FL MOD SED  01/02/2022   PACEMAKER IMPLANT N/A 10/11/2020   Procedure: PACEMAKER IMPLANT;  Surgeon: Vickie Epley, MD;  Location: [place] [facility];  Service: Cardiovascular;  Laterality: N/A;   TONSILLECTOMY        Allergies: Penicillins  Medications: Prior to Admission medications   Medication Sig Start Date End Date Taking? Authorizing Provider  acetaminophen (  TYLENOL) 500 MG tablet Take 1,000 mg by mouth every 6 (six) hours as needed for moderate pain.    [provider]  amLODipine (NORVASC) 10 MG tablet Take 1 tablet (10 mg total) by mouth at bedtime. 01/03/21   [name], MD  apixaban (ELIQUIS) 5 MG TABS tablet Take 1 tablet (5 mg total) by mouth 2 (two) times daily. 12/23/21   Little Ishikawa, MD  bisacodyl (DULCOLAX) 5 MG EC tablet Take 1 tablet (5 mg total) by mouth daily as needed  for severe constipation. 01/05/22   Aline August, MD  gabapentin (NEURONTIN) 300 MG capsule Take 1 capsule (300 mg total) by mouth 2 (two) times daily. 10/12/20   Evans Lance, MD  lidocaine-prilocaine (EMLA) cream Apply to affected area once Patient taking differently: Apply 1 application topically daily as needed (port access). 10/14/21   Truitt Merle, MD  loratadine (CLARITIN) 10 MG tablet Take 10 mg by mouth daily as needed for allergies.    [provider]  LORazepam (ATIVAN) 0.5 MG tablet Take 1 tablet (0.5 mg total) by mouth 2 (two) times daily as needed (for anxiety, and/or chemo-related nausea/vomiting). 11/13/21   Alla Feeling, NP  magic mouthwash w/lidocaine SOLN Take 5 mLs by mouth 4 (four) times daily as needed for mouth pain. 01/05/22   Aline August, MD  Menthol, Topical Analgesic, (BIOFREEZE ROLL-ON EX) Apply 1 application topically as needed (pain).    [provider]  mirtazapine (REMERON) 15 MG tablet Take 1 tablet (15 mg total) by mouth at bedtime. 12/11/21   Truitt Merle, MD  Multiple Vitamin (MULTIVITAMIN) capsule Take 1 capsule by mouth daily.    [provider]  omeprazole (PRILOSEC) 20 MG capsule Take 20 mg by mouth daily. 12/18/21   [provider]  ondansetron (ZOFRAN) 8 MG tablet TAKE 1 TABLET(8 MG) BY MOUTH TWICE DAILY AS NEEDED FOR NAUSEA OR VOMITING 01/05/22   Truitt Merle, MD  Oxycodone HCl 10 MG TABS Take 1 tablet (10 mg total) by mouth every 6 (six) hours as needed. Patient taking differently: Take 10 mg by mouth every 6 (six) hours as needed (pain). 12/11/21   Truitt Merle, MD  polyethylene glycol (MIRALAX / GLYCOLAX) 17 g packet Take 17 g by mouth daily as needed for moderate constipation.    [provider]  pravastatin (PRAVACHOL) 40 MG tablet TAKE 1 TABLET(40 MG) BY MOUTH DAILY Patient taking differently: Take 40 mg by mouth daily. 07/23/20   [name], MD  prochlorperazine (COMPAZINE) 10 MG tablet TAKE 1 TABLET(10 MG) BY  MOUTH EVERY 6 HOURS AS NEEDED FOR NAUSEA OR VOMITING Patient taking differently: Take 10 mg by mouth every 6 (six) hours as needed for nausea or vomiting. 12/16/21   Alla Feeling, NP  senna-docusate (SENOKOT-S) 8.6-50 MG tablet Take 1 tablet by mouth 2 (two) times daily. 01/05/22   Aline August, MD  SYNJARDY XR 12.04-999 MG TB24 Take 1 tablet by mouth 2 (two) times daily. 05/30/21   [provider]  trolamine salicylate (ASPERCREME) 10 % cream Apply 1 application topically 2 (two) times daily as needed for muscle pain.    [provider]     Vital Signs: BP (!) 168/71 (BP Location: Left Arm)    Pulse 99    Temp 98.4 F (36.9 C) (Oral)    Resp 18    Ht '5\' 8"'  (1.727 m)    Wt 299 lb 13.2 oz (136 kg)    SpO2  100%    BMI 45.59 kg/m   Physical Exam awake/alert; chest- CTA bilat ant; left chest wall pacer; heart- RRR; abd- soft,+BS, tender epigastric/LUQ regions to palpation; bilat pretibial edema  Imaging: DG Abd 1 View  Result Date: 01/12/2022 CLINICAL DATA:  Abdominal distention EXAM: ABDOMEN - 1 VIEW COMPARISON:  CT done on 01/07/2022 FINDINGS: Bowel gas pattern is nonspecific. Small amount of stool is seen in colon. Liver appears to be enlarged in size. No abnormal calcifications are seen. IUD is seen in pelvis. There is stent in the course of first and second portions of duodenum. Pacemaker leads are seen in the heart. IMPRESSION: Nonspecific bowel gas pattern. Stent is seen in the region of duodenum. Electronically Signed   By: Elmer Picker M.D.   On: 01/12/2022 14:50    Labs:  CBC: Recent Labs    01/10/22 0555 01/10/22 1415 01/11/22 0019 01/12/22 0442 01/13/22 0455  WBC 10.4  --  11.0* 14.0* 13.4*  HGB 7.6* 8.2* 7.7* 8.5* 7.4*  HCT 24.5* 25.9* 24.9* 28.0* 24.7*  PLT 194  --  219 276 247    COAGS: Recent Labs    10/06/21 1637 10/07/21 0219 01/01/22 0812 01/07/22 0558 01/07/22 1843 01/08/22 0457 01/09/22 0043 01/09/22 1653 01/10/22 0555  INR  1.5*  --  1.3* 1.6*  --  1.7*  --   --   --   APTT 27   < > 28 24   < > 93* 93* 106* 136*   < > = values in this interval not displayed.    BMP: Recent Labs    01/10/22 0555 01/11/22 0019 01/12/22 0442 01/13/22 0455  NA 136 136 139 139  K 2.9* 3.1* 3.9 3.5  CL 95* 99 103 105  CO2 '31 31 29 26  ' GLUCOSE 193* 171* 184* 210*  BUN '19 17 14 11  ' CALCIUM 6.8* 6.9* 7.6* 7.6*  CREATININE 1.16* 0.96 0.93 0.89  GFRNONAA 54* >60 >60 >60    LIVER FUNCTION TESTS: Recent Labs    01/03/22 0301 01/05/22 0104 01/07/22 0558 01/08/22 0457  BILITOT 0.2* 0.2* 0.8 0.9  AST '16 17 17 ' 14*  ALT '9 10 13 10  ' ALKPHOS 80 82 86 57  PROT 5.1* 6.0* 8.2* 5.4*  ALBUMIN <1.5* <1.5* 2.3* 1.9*    Assessment and Plan: Pt known to IR service from liver lesion bx/port a cath placement in 2022, and port removal on 01/01/22 due to incision dehiscence. She has a PMH sig for CHB, s/p PPM, DM 2, hypertension, metastatic pancreatic cancer, endometrial cancer, morbid obesity presented in the ED with complaints of nausea and vomiting.  She was recently hospitalized from 1/26 - 1/30 for sepsis secondary to port infection. She also has gastric outlet obstruction from met pancreatic cancer.  She underwent EGD on 01/08/22 which revealed:   Moderately severe reflux and erosive esophagitis with no bleeding. - A medium amount of food (residue) in the stomach. - Partially obstructing non-bleeding duodenal ulcer.  - Acquired duodenal stenosis. Prosthesis placed. - Normal second portion of the duodenum. - The examination was otherwise normal.  She cont to have abd discomfort, intermittent N/V Abd film yesterday showed nonspecific bowel gas pattern. Stent in region of duodenum. Request received from Dr. Burr Medico for venting G tube with poss J tube extension prior to d/c to hospice facility. Imaging studies have been reviewed by Dr. McCullough/Dr. Mir. Risks and benefits image guided gastrostomy tube placement was discussed with the  patient/family  including, but not limited  to the need for a barium enema during the procedure, bleeding, infection, peritonitis and/or damage to adjacent structures.  All of the patient's questions were answered; pt is still contemplating whether to proceed with tube placement and her disposition to hospice facility is also being evaluated to determine which site she qualifies for after d/c which would impact whether tube is actually needed.Will cont to monitor for now and await word from medical team before performing case. Eliquis to be held in meantime. Procedure could be done as soon as tomorrow if desired.      Electronically Signed: D. Rowe Robert, PA-C 01/13/2022, 11:13 AM   I spent a total of 25 Minutes at the the patient's bedside AND on the patient's hospital floor or unit, greater than 50% of which was counseling/coordinating care for possible gastrostomy tube placement with possible jejunostomy tube extension    Patient ID: Leslie Mullen, female   DOB: [birth year], 61 y.o.   MRN: [id number]

## 2022-01-13 NOTE — Progress Notes (Signed)
PROGRESS NOTE    Leslie Mullen  JKD:[id number] DOB: 1961-07-29 DOA: 01/07/2022 PCP: Sue Lush, PA-C    Brief Narrative: This 61 years old female with PMH significant for CHB, s/p PPM, DM 2, hypertension, metastatic pancreatic cancer, endometrial cancer, morbid obesity presented in the ED with complaints of nausea and vomiting.  She was recently hospitalized from 1/26 - 1/30 for sepsis secondary to port infection.  At that time she was sent home with 5 days of oral doxycycline.  Blood cultures were negative.  Patient has acutely developed nausea and vomiting,  She was unable to take anything down,  she has tried Compazine and antacids did not help.  She was feeling dizzy by end of day with worsening nausea and vomiting. Patient is admitted for sepsis secondary to pancreatic infection versus left lower lobe pneumonia. She is also found to have gastric outlet obstruction, general surgery, gastroenterology, oncology, palliative care consulted.  She underwent EGD with stent, nausea has improved.  Now she has recurrence of nausea and abdominal pain.  Dr. Annamaria Boots has long discussion with the patient, plan is for venting G-tube and possible J-tube feeding placement through PEG via IR.  Patient and family has agreed with hospice.  Assessment & Plan:   Principal Problem:   Gastric outlet obstruction Active Problems:   Type 2 diabetes mellitus with diabetic polyneuropathy, with long-term current use of insulin (HCC)   Paroxysmal atrial fibrillation (HCC)   AKI (acute kidney injury) ([place])   Pancreatic cancer metastasized to liver ([place])   Sepsis ([place])   Palliative care by specialist   Goals of care, counseling/discussion   General weakness  Sepsis sec. to pancreatic infection versus left lower lobe pneumonia: Completed course of antibiotics (cefepime,  Flagyl) for 5 days. Continue IV fluids.  Continue supportive care.  Procalcitonin 0.67>0.26.  Blood cultures no growth to date. Sepsis physiology  resolved.   Gastric outlet obstruction: Pancreatic cancer with mets to the liver: Patient with metastatic pancreatic cancer followed by Dr. Annamaria Boots on palliative chemotherapy presented with nausea, vomiting and found to have gastric outlet obstruction secondary to pancreatic head lesion.  Continued NG tube decompression.  She was kept NPO. Patient had poor prognosis and poor tolerance to chemotherapy.  She is not a candidate for more cytotoxic therapy.   Goals of care at this point is palliative and comfort. GI consulted, EGD attempted with possible stent placement.  She feels improved.  NG tube removed. Patient was tolerating clear liquid diet, without nausea and vomiting. GI recommended multiple small meals, plenty of fluids, antireflux measures. Patient tolerated clear liquid diet,  advanced to low residue diet  but has recurrence of Nausea and abdominal pain. Discussed with GI, obtain x-ray to see the patency of stent. Dr. Annamaria Boots has long discussion with the patient, Plan is for venting G-tube for comfort and possible J-tube feeding placement through PEG via IR.   Patient and family has agreed with hospice.  Diabetes mellitus type 2: Continue sliding scale.  AKI: > Resolved. Could be due to dehydration, Dec. PO intake. Serum creatinine normalized with IV hydration. Avoid nephrotoxic medications CT abdomen without obstruction.  Essential hypertension: Hydralazine as needed. Resume home medication once patient is able to take p.o.  Paroxysmal A-fib: Heart rate controlled.  Continue Eliquis. Hold Eliquis in anticipation for venting G-tube.  Hypokalemia/hypophosphatemia: Replaced.  Continue to monitor.  Normochromic normocytic anemia: Her hemoglobin has dropped from 8.0-7.4>6.1 Transfuse 1 PRBC, posttrans Hb 7.8>7.6>8.2>7.7>8.5>7.4 Continue to monitor H&H.  DVT prophylaxis: SCDs  Code Status: Full code. Family Communication: No family at bed side. Disposition Plan:   Status is:  Inpatient Remains inpatient appropriate because: Admitted for sepsis and gastric outlet obstruction in the setting of pancreatic metastatic carcinoma.  General surgery GI and oncology consulted.  Patient underwent EGD with stenting.  Tolerated well,  She is now tolerating clear liquid diet. Plan is for venting G-tube and possible J-tube feeding placement through PEG via IR.  Patient and family has agreed with hospice.  Patient is medically clear for discharge: No  Follow-up TOC for possible placement.  Consultants:  Oncology General surgery Gastroenterology Palliative care  Procedures: Gastrostomy tube  antimicrobials:   Anti-infectives (From admission, onward)    Start     Dose/Rate Route Frequency Ordered Stop   01/10/22 1500  ceFEPIme (MAXIPIME) 2 g in sodium chloride 0.9 % 100 mL IVPB  Status:  Discontinued        2 g 200 mL/hr over 30 Minutes Intravenous Every 8 hours 01/10/22 0926 01/12/22 0923   01/07/22 2000  ceFEPIme (MAXIPIME) 2 g in sodium chloride 0.9 % 100 mL IVPB  Status:  Discontinued        2 g 200 mL/hr over 30 Minutes Intravenous Every 12 hours 01/07/22 0935 01/10/22 0926   01/07/22 1800  metroNIDAZOLE (FLAGYL) IVPB 500 mg  Status:  Discontinued        500 mg 100 mL/hr over 60 Minutes Intravenous Every 12 hours 01/07/22 1159 01/12/22 0923   01/07/22 0645  ceFEPIme (MAXIPIME) 2 g in sodium chloride 0.9 % 100 mL IVPB        2 g 200 mL/hr over 30 Minutes Intravenous  Once 01/07/22 0641 01/07/22 0822   01/07/22 0645  metroNIDAZOLE (FLAGYL) IVPB 500 mg        500 mg 100 mL/hr over 60 Minutes Intravenous  Once 01/07/22 0641 01/07/22 0757        Subjective: Patient was seen and examined at bedside.  Overnight events noted.   Patient underwent EGD with stenting,  tolerated well. She still remains nauseous and complains of pain, asking for more pain medications She is able to pass flatus.  Objective: Vitals:   01/12/22 0426 01/12/22 1134 01/12/22 2031 01/13/22  0348  BP: 139/70 133/78 (!) 142/70 (!) 168/71  Pulse: 77 82 83 99  Resp: 16 20 19 18   Temp: 97.9 F (36.6 C) 98 F (36.7 C) 98.1 F (36.7 C) 98.4 F (36.9 C)  TempSrc: Oral Oral Oral Oral  SpO2: 99% 100% 100% 100%  Weight:      Height:        Intake/Output Summary (Last 24 hours) at 01/13/2022 1203 Last data filed at 01/13/2022 0830 Gross per 24 hour  Intake 2480.37 ml  Output 1300 ml  Net 1180.37 ml   Filed Weights   01/07/22 0500 01/07/22 0502 01/08/22 1229  Weight: (!) 137.8 kg 136.1 kg 136 kg    Examination:  General exam: Appears comfortable, deconditioned, not in any distress. Respiratory system: Clear to auscultation bilaterally, respiratory normal, RR 16 Cardiovascular system: S1 & S2 heard, regular rate and rhythm, no murmur. Gastrointestinal system: Abdomen is soft,  mildly tender, non distended, BS+ Central nervous system: Alert and oriented x 3. No focal neurological deficits. Extremities: Bilateral pedal edema, no cyanosis no clubbing. Skin: No rashes, lesions or ulcers Psychiatry: Judgement and insight appear normal. Mood & affect appropriate.     Data Reviewed: I have personally reviewed following labs and imaging studies  CBC:  Recent Labs  Lab 01/09/22 0043 01/09/22 0812 01/10/22 0555 01/10/22 1415 01/11/22 0019 01/12/22 0442 01/13/22 0455  WBC 10.9*  --  10.4  --  11.0* 14.0* 13.4*  HGB 6.1*   < > 7.6* 8.2* 7.7* 8.5* 7.4*  HCT 20.0*   < > 24.5* 25.9* 24.9* 28.0* 24.7*  MCV 94.8  --  90.7  --  94.0 94.6 95.0  PLT 177  --  194  --  219 276 247   < > = values in this interval not displayed.   Basic Metabolic Panel: Recent Labs  Lab 01/09/22 0043 01/10/22 0555 01/11/22 0019 01/12/22 0442 01/13/22 0455  NA 139 136 136 139 139  K 2.8* 2.9* 3.1* 3.9 3.5  CL 97* 95* 99 103 105  CO2 36* 31 31 29 26   GLUCOSE 251* 193* 171* 184* 210*  BUN 30* 19 17 14 11   CREATININE 1.52* 1.16* 0.96 0.93 0.89  CALCIUM 7.0* 6.8* 6.9* 7.6* 7.6*  MG 2.0  --   1.7 1.7  --   PHOS 3.2  --  2.0* 2.8  --    GFR: Estimated Creatinine Clearance: 99.6 mL/min (by C-G formula based on SCr of 0.89 mg/dL). Liver Function Tests: Recent Labs  Lab 01/07/22 0558 01/08/22 0457  AST 17 14*  ALT 13 10  ALKPHOS 86 57  BILITOT 0.8 0.9  PROT 8.2* 5.4*  ALBUMIN 2.3* 1.9*   Recent Labs  Lab 01/07/22 0558  LIPASE 43   No results for input(s): AMMONIA in the last 168 hours. Coagulation Profile: Recent Labs  Lab 01/07/22 0558 01/08/22 0457  INR 1.6* 1.7*   Cardiac Enzymes: No results for input(s): CKTOTAL, CKMB, CKMBINDEX, TROPONINI in the last 168 hours.  BNP (last 3 results) No results for input(s): PROBNP in the last 8760 hours. HbA1C: No results for input(s): HGBA1C in the last 72 hours.  CBG: Recent Labs  Lab 01/12/22 1629 01/12/22 2028 01/13/22 0023 01/13/22 0424 01/13/22 0723  GLUCAP 210* 156* 180* 181* 191*   Lipid Profile: No results for input(s): CHOL, HDL, LDLCALC, TRIG, CHOLHDL, LDLDIRECT in the last 72 hours. Thyroid Function Tests: No results for input(s): TSH, T4TOTAL, FREET4, T3FREE, THYROIDAB in the last 72 hours. Anemia Panel: No results for input(s): VITAMINB12, FOLATE, FERRITIN, TIBC, IRON, RETICCTPCT in the last 72 hours. Sepsis Labs: Recent Labs  Lab 01/07/22 0556 01/07/22 0838 01/07/22 1240 01/07/22 1717 01/08/22 0457  PROCALCITON  --   --   --   --  0.26  LATICACIDVEN 2.3* 2.5* 1.6 2.1*  --     Recent Results (from the past 240 hour(s))  Blood Culture (routine x 2)     Status: None   Collection Time: 01/07/22  5:59 AM   Specimen: Site Not Specified; Blood  Result Value Ref Range Status   Specimen Description   Final    SITE NOT SPECIFIED Performed at [facility] [address] [place], [place] [address]    Special Requests   Final    BOTTLES DRAWN AEROBIC AND ANAEROBIC Blood Culture adequate volume Performed at [facility] [address] [place],  [place] [address]    Culture   Final    NO GROWTH 5 DAYS Performed at [facility], [place] [address], [place], [place] [address]    Report Status 01/12/2022 FINAL  Final  Urine Culture     Status: None   Collection Time: 01/07/22  5:59 AM   Specimen: In/Out Cath Urine  Result Value Ref Range Status   Specimen Description   Final    IN/OUT CATH URINE Performed at [facility] [address], [place], [place] [address]    Special Requests   Final    NONE Performed at [facility], [place] [address] [place], [place] [address]    Culture   Final    NO GROWTH Performed at [facility], [place] [address] [place], [place] [address]    Report Status 01/09/2022 FINAL  Final  Resp Panel by RT-PCR (Flu A&B, Covid) Nasopharyngeal Swab     Status: None   Collection Time: 01/07/22  8:38 AM   Specimen: Nasopharyngeal Swab; Nasopharyngeal(NP) swabs in vial transport medium  Result Value Ref Range Status   SARS Coronavirus 2 by RT PCR NEGATIVE NEGATIVE Final    Comment: (NOTE) SARS-CoV-2 target nucleic acids are NOT DETECTED.  The SARS-CoV-2 RNA is generally detectable in upper respiratory specimens during the acute phase of infection. The lowest concentration of SARS-CoV-2 viral copies this assay can detect is 138 copies/mL. A negative result does not preclude SARS-Cov-2 infection and should not be used as the sole basis for treatment or other patient management decisions. A negative result may occur with  improper specimen collection/handling, submission of specimen other than nasopharyngeal swab, presence of viral mutation(s) within the areas targeted by this assay, and inadequate number of viral copies(<138 copies/mL). A negative result must be combined with clinical observations, patient history, and epidemiological information. The expected result is Negative.  Fact Sheet for Patients:  EntrepreneurPulse.com.au  Fact Sheet for  Healthcare Providers:  IncredibleEmployment.be  This test is no t yet approved or cleared by the [place] FDA and  has been authorized for detection and/or diagnosis of SARS-CoV-2 by FDA under an Emergency Use Authorization (EUA). This EUA will remain  in effect (meaning this test can be used) for the duration of the COVID-19 declaration under Section 564(b)(1) of the Act, 21 U.S.C.section 360bbb-3(b)(1), unless the authorization is terminated  or revoked sooner.       Influenza A by PCR NEGATIVE NEGATIVE Final   Influenza B by PCR NEGATIVE NEGATIVE Final    Comment: (NOTE) The Xpert Xpress SARS-CoV-2/FLU/RSV plus assay is intended as an aid in the diagnosis of influenza from Nasopharyngeal swab specimens and should not be used as a sole basis for treatment. Nasal washings and aspirates are unacceptable for Xpert Xpress SARS-CoV-2/FLU/RSV testing.  Fact Sheet for Patients: EntrepreneurPulse.com.au  Fact Sheet for Healthcare Providers: IncredibleEmployment.be  This test is not yet approved or cleared by the [place] FDA and has been authorized for detection and/or diagnosis of SARS-CoV-2 by FDA under an Emergency Use Authorization (EUA). This EUA will remain in effect (meaning this test can be used) for the duration of the COVID-19 declaration under Section 564(b)(1) of the Act, 21 U.S.C. section 360bbb-3(b)(1), unless the authorization is terminated or revoked.  Performed at [facility], [place] [address], [place], [place] [address]   Blood Culture (routine x 2)     Status: None   Collection Time: 01/07/22  5:17 PM   Specimen: BLOOD  Result Value Ref Range Status   Specimen Description   Final    BLOOD RIGHT ANTECUBITAL Performed at [facility] [address], [place],  [place] [address]    Special Requests   Final    BOTTLES DRAWN AEROBIC ONLY Blood Culture results may  not be optimal due to an inadequate volume  of blood received in culture bottles Performed at [facility], [place] [address], [place], [place] [address]    Culture   Final    NO GROWTH 5 DAYS Performed at [facility], [place] [address], [place], [place] [address]    Report Status 01/12/2022 FINAL  Final    Radiology Studies: DG Abd 1 View  Result Date: 01/12/2022 CLINICAL DATA:  Abdominal distention EXAM: ABDOMEN - 1 VIEW COMPARISON:  CT done on 01/07/2022 FINDINGS: Bowel gas pattern is nonspecific. Small amount of stool is seen in colon. Liver appears to be enlarged in size. No abnormal calcifications are seen. IUD is seen in pelvis. There is stent in the course of first and second portions of duodenum. Pacemaker leads are seen in the heart. IMPRESSION: Nonspecific bowel gas pattern. Stent is seen in the region of duodenum. Electronically Signed   By: Elmer Picker M.D.   On: 01/12/2022 14:50    Scheduled Meds:  sodium chloride   Intravenous Once   apixaban  5 mg Oral BID   calcium carbonate  1 tablet Oral BID WC   chlorhexidine  15 mL Mouth Rinse BID   insulin aspart  0-20 Units Subcutaneous TID WC   insulin aspart  0-5 Units Subcutaneous QHS   mouth rinse  15 mL Mouth Rinse q12n4p    morphine injection  2 mg Intravenous Once   pantoprazole (PROTONIX) IV  40 mg Intravenous Q12H   Continuous Infusions:  sodium chloride 100 mL/hr at 01/13/22 0355   sodium chloride Stopped (01/08/22 1220)   promethazine (PHENERGAN) injection (IM or IVPB)       LOS: 6 days    Time spent: 35 mins    Rashon Rezek, MD [facility]   If 7PM-7AM, please contact night-coverage

## 2022-01-13 NOTE — Progress Notes (Signed)
Daily Progress Note   Patient Name: Leslie Mullen       Date: 01/13/2022 DOB: 10-Feb-1961  Age: 61 y.o. MRN#: [id number] Attending Physician: Shawna Clamp, MD Primary Care Physician: Elinor Parkinson Admit Date: 01/07/2022  Reason for Consultation/Follow-up: Establishing goals of care, Hospice Evaluation, Non pain symptom management, and Pain control  Subjective: I saw and examined Leslie Mullen this evening.  Her daughter was present at the bedside.  Leslie Mullen tells me that symptoms have been fairly well controlled with current regimen but she is trying to determine what her next steps are regarding placement of PEG/J-tube.  We discussed risks and benefits associated with today's and also talked about potential discharge options from the hospital.  She had many questions about hospice services and I reviewed these with her today.  She is also aware that if she has particular questions, there are liaisons who would be able to meet with her when she determines which agency she would like to work with for but I do better get the bed hospice services at time of discharge.  Length of Stay: 6  Current Medications: Scheduled Meds:   sodium chloride   Intravenous Once   calcium carbonate  1 tablet Oral BID WC   chlorhexidine  15 mL Mouth Rinse BID   insulin aspart  0-20 Units Subcutaneous TID WC   insulin aspart  0-5 Units Subcutaneous QHS   mouth rinse  15 mL Mouth Rinse q12n4p    morphine injection  2 mg Intravenous Once   pantoprazole (PROTONIX) IV  40 mg Intravenous Q12H    Continuous Infusions:  sodium chloride 100 mL/hr at 01/13/22 0355   sodium chloride Stopped (01/08/22 1220)   heparin 1,350 Units/hr (01/13/22 1323)   promethazine (PHENERGAN) injection (IM or IVPB)      PRN  Meds: sodium chloride, docusate sodium, HYDROmorphone (DILAUDID) injection, metoprolol tartrate, prochlorperazine, promethazine (PHENERGAN) injection (IM or IVPB)  Physical Exam         Patient appears deconditioned  Regular work of breathing Awake and alert Has edema   Vital Signs: BP (!) 155/74 (BP Location: Right Arm)    Pulse 88    Temp 98.8 F (37.1 C) (Oral)    Resp 18    Ht 5\' 8"  (1.727 m)    Wt 136 kg  SpO2 100%    BMI 45.59 kg/m  SpO2: SpO2: 100 % O2 Device: O2 Device: Nasal Cannula O2 Flow Rate: O2 Flow Rate (L/min): 2 L/min  Intake/output summary:  Intake/Output Summary (Last 24 hours) at 01/13/2022 2137 Last data filed at 01/13/2022 0830 Gross per 24 hour  Intake 905.82 ml  Output 1000 ml  Net -94.18 ml    LBM: Last BM Date: 01/09/22 Baseline Weight: Weight: (!) 137.8 kg Most recent weight: Weight: 136 kg       Palliative Assessment/Data:      Patient Active Problem List   Diagnosis Date Noted   Palliative care by specialist    Goals of care, counseling/discussion    General weakness    Gastric outlet obstruction 01/07/2022   Sepsis ([place]) 01/02/2022   Dizziness 01/01/2022   SIRS (systemic inflammatory response syndrome) (HCC) 01/01/2022   Lactic acidosis 01/01/2022   Acute GI bleeding 12/18/2021   Symptomatic anemia 12/16/2021   Stage 3a chronic kidney disease (CKD) ([place]) 12/16/2021   Obesity, Class III, BMI 40-49.9 (morbid obesity) ([place]) 12/16/2021   Hyperlipidemia 12/16/2021   Anxiety 12/16/2021   Chronic pain 12/16/2021   Complete heart block ([place]) 12/16/2021   Port-A-Cath in place 11/18/2021   Family history of colon cancer 10/20/2021   Pancreatic cancer metastasized to liver ([place]) 10/14/2021   Pancreatic mass    Pancreatitis 10/05/2021   Leukocytosis 10/05/2021   AKI (acute kidney injury) ([place]) 10/05/2021   Chronic anticoagulation 10/05/2021   Thrombocytosis 10/05/2021   Paroxysmal atrial fibrillation ([place]) 09/18/2021   Secondary  hypercoagulable state ([place]) 09/18/2021   Endometrial cancer ([place]) 07/29/2021   Heart block 10/10/2020   Fall at home, initial encounter 10/10/2020   Type 2 diabetes mellitus with diabetic polyneuropathy, with long-term current use of insulin ([place]) 01/31/2020   Type 2 diabetes mellitus with hypoglycemia ([place]) 01/31/2020   Type 2 diabetes mellitus with stage 3a chronic kidney disease, with long-term current use of insulin ([place]) 01/31/2020   Abnormal feces    Benign neoplasm of ascending colon    Benign neoplasm of cecum    Benign neoplasm of transverse colon     Palliative Care Assessment & Plan   Patient Profile:    Assessment: Palliative performance scale 40%. 61 year old lady with life limiting illness of metastatic pancreatic cancer, left lower lobe pneumonia, metastatic disease burden to liver, admitted with gastric outlet obstruction secondary to pancreatic head lesion, status post stenting.  GI colleagues following closely and patient's diet was gradually being advanced.  Recommendations/Plan: Full code/full scope She reports needing time to consider her best option moving forward.  She is still thinking about potential for PEG/J-tube.  She reports needing to settle her affairs and understands that this would be the most likely way to extend her life.  She also understands this will be limited, likely a matter of weeks and limited number of months, even with more aggressive care including PEG/J-tube placement. We discussed hospice services and venues for hospice care including home hospice, hospice at long-term care facility, and residential hospice.  She initially stated that her goal would be to transition to hospice at long-term care facility.  We discussed potential challenges associated with this including the fact that Medicare benefit does not pay for room and board the facility.  This is not information she was aware of previously and she says it may affect decision making.  She  requested time to discuss with family and asked that I discuss further with [facility] team tomorrow.  Code Status:    Code Status Orders  (From admission, onward)           Start     Ordered   01/07/22 1200  Full code  Continuous        01/07/22 1159           Code Status History     Date Active Date Inactive Code Status Order ID Comments User Context   01/01/2022 1149 01/05/2022 2054 Full Code [id number]  Norval Morton, MD ED   12/19/2021 1027 12/20/2021 1944 Full Code [id number]  Little Ishikawa, MD Inpatient   12/16/2021 1819 12/19/2021 1027 DNR [id number]  Mariel Aloe, MD Inpatient   10/05/2021 1053 10/09/2021 2244 Full Code [id number]  Norval Morton, MD ED   07/29/2021 0518 07/29/2021 1422 Full Code [id number]  Dorothyann Gibbs, NP Inpatient   10/10/2020 1620 10/12/2020 1902 Full Code [id number]  Shirley Friar, PA-C ED   10/10/2020 1009 10/10/2020 1128 Full Code [id number]  Dorothyann Gibbs, NP Inpatient       Prognosis:  Guarded   Discharge Planning: To Be Determined  Care plan was discussed with patient and her daughter.   Thank you for allowing the Palliative Medicine Team to assist in the care of this patient.  Micheline Rough, MD  Please contact Palliative Medicine Team phone at [phone] for questions and concerns.

## 2022-01-13 NOTE — Progress Notes (Signed)
[facility] Gastroenterology Progress Note  Leslie Mullen 61 y.o. [birth year]  CC:  Nausea/vomiting, abdominal pain   Subjective: Patient resting comfortably in bed. Her husband and son are with her this morning. She had one episode of vomiting last night that appeared "milky." Denies hematemesis. She is currently on a clear liquid diet, has been drinking this morning and denies any vomiting today. Patient states she can only tolerate drinking a little at a time. She also reports abdominal pain that is located to her upper abdomen, about the same as yesterday but not worsening. She is feeling a little better after receiving pain medication this morning. Denies any bowel movements in the last few days.  ROS :  Review of Systems  Constitutional:  Negative for chills and fever.  Gastrointestinal:  Positive for abdominal pain, nausea and vomiting.     Objective: Vital signs in last 24 hours: Vitals:   01/12/22 2031 01/13/22 0348  BP: (!) 142/70 (!) 168/71  Pulse: 83 99  Resp: 19 18  Temp: 98.1 F (36.7 C) 98.4 F (36.9 C)  SpO2: 100% 100%    Physical Exam:  General:  Alert, cooperative, no distress  Head:  Normocephalic, without obvious abnormality, atraumatic  Eyes:  Anicteric sclera, EOM's intact  Lungs:   Clear to auscultation bilaterally, respirations unlabored  Heart:  Regular rate and rhythm, S1, S2 normal  Abdomen:   Soft, diffuse tenderness with guarding,bowel sounds active  Extremities: Bilateral nonpitting edema       Lab Results: Recent Labs    01/11/22 0019 01/12/22 0442 01/13/22 0455  NA 136 139 139  K 3.1* 3.9 3.5  CL 99 103 105  CO2 31 29 26   GLUCOSE 171* 184* 210*  BUN 17 14 11   CREATININE 0.96 0.93 0.89  CALCIUM 6.9* 7.6* 7.6*  MG 1.7 1.7  --   PHOS 2.0* 2.8  --    No results for input(s): AST, ALT, ALKPHOS, BILITOT, PROT, ALBUMIN in the last 72 hours. Recent Labs    01/12/22 0442 01/13/22 0455  WBC 14.0* 13.4*  HGB 8.5* 7.4*  HCT 28.0* 24.7*  MCV  94.6 95.0  PLT 276 247   No results for input(s): LABPROT, INR in the last 72 hours.    Assessment Gastric outlet obstruction due to metastatic pancreatic cancer  - S/p duodenal stent 2/2 - Improving leukocytosis - Xray 2/6 to evaluate patency of stent due to N/V showed there is stent in the course of first and second portions of duodenum.  Plan: - Continue supportive care, antiemetics as needed - [facility] will follow   Angelique Holm PA-C 01/13/2022, 9:53 AM  Contact #  [phone]

## 2022-01-13 NOTE — Progress Notes (Signed)
Chaplain engaged in a follow-up visit with Leslie Mullen.  Leslie Mullen shared the need to have some documents notarized so that her daughter will be able to handle business surrounding finances, insurance, decision-making and more.  Chaplain explained that typically our notaries will only notarize healthcare POA documents.  Chaplain did speak with the [facility] about Leslie Mullen's situation and that she will be going to hospice soon.  Chaplain let Leslie Mullen know that the notary would decide if they wanted to notarize those documents.   Chaplain offered listening and support.     01/13/22 1600  Clinical Encounter Type  Visited With Patient and family together  Visit Type Follow-up;Social support  Referral From Patient  Consult/Referral To Chaplain

## 2022-01-13 NOTE — Progress Notes (Signed)
Leslie Mullen   DOB:June 28, 1961   KG#:[id number]   GUY#:[id number]  Oncology follow up note   Subjective: Pt underwent duodenal stent placement by Dr. Watt Climes last Friday, but unfortunately she is still not able to tolerate oral, with recurrent nausea and vomiting.  NG tube has been removed.  Has abdominal pain but overall controlled. Her husband and son were at bedside.   Objective:  Vitals:   01/12/22 2031 01/13/22 0348  BP: (!) 142/70 (!) 168/71  Pulse: 83 99  Resp: 19 18  Temp: 98.1 F (36.7 C) 98.4 F (36.9 C)  SpO2: 100% 100%    Body mass index is 45.59 kg/m.  Intake/Output Summary (Last 24 hours) at 01/13/2022 1122 Last data filed at 01/13/2022 0830 Gross per 24 hour  Intake 2480.37 ml  Output 1800 ml  Net 680.37 ml      Sclerae unicteric  Port site is covered, with soaked gauze   MSK no peripheral edema  Neuro nonfocal   CBG (last 3)  Recent Labs    01/13/22 0023 01/13/22 0424 01/13/22 0723  GLUCAP 180* 181* 191*     Labs:  Urine Studies No results for input(s): UHGB, CRYS in the last 72 hours.  Invalid input(s): UACOL, UAPR, USPG, UPH, UTP, UGL, UKET, UBIL, UNIT, UROB, [place], UEPI, UWBC, Junie Panning [place], [place], [place]  Basic Metabolic Panel: Recent Labs  Lab 01/09/22 0043 01/10/22 0555 01/11/22 0019 01/12/22 0442 01/13/22 0455  NA 139 136 136 139 139  K 2.8* 2.9* 3.1* 3.9 3.5  CL 97* 95* 99 103 105  CO2 36* 31 31 29 26   GLUCOSE 251* 193* 171* 184* 210*  BUN 30* 19 17 14 11   CREATININE 1.52* 1.16* 0.96 0.93 0.89  CALCIUM 7.0* 6.8* 6.9* 7.6* 7.6*  MG 2.0  --  1.7 1.7  --   PHOS 3.2  --  2.0* 2.8  --    GFR Estimated Creatinine Clearance: 99.6 mL/min (by C-G formula based on SCr of 0.89 mg/dL). Liver Function Tests: Recent Labs  Lab 01/07/22 0558 01/08/22 0457  AST 17 14*  ALT 13 10  ALKPHOS 86 57  BILITOT 0.8 0.9  PROT 8.2* 5.4*  ALBUMIN 2.3* 1.9*   Recent Labs  Lab 01/07/22 0558  LIPASE 43   No results for input(s): AMMONIA in the  last 168 hours. Coagulation profile Recent Labs  Lab 01/07/22 0558 01/08/22 0457  INR 1.6* 1.7*    CBC: Recent Labs  Lab 01/09/22 0043 01/09/22 0812 01/10/22 0555 01/10/22 1415 01/11/22 0019 01/12/22 0442 01/13/22 0455  WBC 10.9*  --  10.4  --  11.0* 14.0* 13.4*  HGB 6.1*   < > 7.6* 8.2* 7.7* 8.5* 7.4*  HCT 20.0*   < > 24.5* 25.9* 24.9* 28.0* 24.7*  MCV 94.8  --  90.7  --  94.0 94.6 95.0  PLT 177  --  194  --  219 276 247   < > = values in this interval not displayed.   Cardiac Enzymes: No results for input(s): CKTOTAL, CKMB, CKMBINDEX, TROPONINI in the last 168 hours.  BNP: Invalid input(s): POCBNP CBG: Recent Labs  Lab 01/12/22 1629 01/12/22 2028 01/13/22 0023 01/13/22 0424 01/13/22 0723  GLUCAP 210* 156* 180* 181* 191*   D-Dimer No results for input(s): DDIMER in the last 72 hours. Hgb A1c No results for input(s): HGBA1C in the last 72 hours.  Lipid Profile No results for input(s): CHOL, HDL, LDLCALC, TRIG, CHOLHDL, LDLDIRECT in the last 72 hours. Thyroid function  studies No results for input(s): TSH, T4TOTAL, T3FREE, THYROIDAB in the last 72 hours.  Invalid input(s): FREET3  Anemia work up No results for input(s): VITAMINB12, FOLATE, FERRITIN, TIBC, IRON, RETICCTPCT in the last 72 hours. Microbiology Recent Results (from the past 240 hour(s))  Blood Culture (routine x 2)     Status: None   Collection Time: 01/07/22  5:59 AM   Specimen: Site Not Specified; Blood  Result Value Ref Range Status   Specimen Description   Final    SITE NOT SPECIFIED Performed at [place] [address] [place], [place] [address]    Special Requests   Final    BOTTLES DRAWN AEROBIC AND ANAEROBIC Blood Culture adequate volume Performed at [place] [address], [place], [place] [address]    Culture   Final    NO GROWTH 5 DAYS Performed at [facility], [place] [address] [place], [place] [address]    Report  Status 01/12/2022 FINAL  Final  Urine Culture     Status: None   Collection Time: 01/07/22  5:59 AM   Specimen: In/Out Cath Urine  Result Value Ref Range Status   Specimen Description   Final    IN/OUT CATH URINE Performed at [place] [address] [place], [place] [address]    Special Requests   Final    NONE Performed at [facility], [place] [address] [place], [place] [address]    Culture   Final    NO GROWTH Performed at [facility], [place] [address], [place],  [address]    Report Status 01/09/2022 FINAL  Final  Resp Panel by RT-PCR (Flu A&B, Covid) Nasopharyngeal Swab     Status: None   Collection Time: 01/07/22  8:38 AM   Specimen: Nasopharyngeal Swab; Nasopharyngeal(NP) swabs in vial transport medium  Result Value Ref Range Status   SARS Coronavirus 2 by RT PCR NEGATIVE NEGATIVE Final    Comment: (NOTE) SARS-CoV-2 target nucleic acids are NOT DETECTED.  The SARS-CoV-2 RNA is generally detectable in upper respiratory specimens during the acute phase of infection. The lowest concentration of SARS-CoV-2 viral copies this assay can detect is 138 copies/mL. A negative result does not preclude SARS-Cov-2 infection and should not be used as the sole basis for treatment or other patient management decisions. A negative result may occur with  improper specimen collection/handling, submission of specimen other than nasopharyngeal swab, presence of viral mutation(s) within the areas targeted by this assay, and inadequate number of viral copies(<138 copies/mL). A negative result must be combined with clinical observations, patient history, and epidemiological information. The expected result is Negative.  Fact Sheet for Patients:  EntrepreneurPulse.com.au  Fact Sheet for Healthcare Providers:  IncredibleEmployment.be  This test is no t yet approved or cleared by the [place] FDA and   has been authorized for detection and/or diagnosis of SARS-CoV-2 by FDA under an Emergency Use Authorization (EUA). This EUA will remain  in effect (meaning this test can be used) for the duration of the COVID-19 declaration under Section 564(b)(1) of the Act, 21 U.S.C.section 360bbb-3(b)(1), unless the authorization is terminated  or revoked sooner.       Influenza A by PCR NEGATIVE NEGATIVE Final   Influenza B by PCR NEGATIVE NEGATIVE Final    Comment: (NOTE) The Xpert Xpress SARS-CoV-2/FLU/RSV plus assay is intended as an aid in the diagnosis of influenza from Nasopharyngeal swab specimens and should not be  used as a sole basis for treatment. Nasal washings and aspirates are unacceptable for Xpert Xpress SARS-CoV-2/FLU/RSV testing.  Fact Sheet for Patients: EntrepreneurPulse.com.au  Fact Sheet for Healthcare Providers: IncredibleEmployment.be  This test is not yet approved or cleared by the [place] FDA and has been authorized for detection and/or diagnosis of SARS-CoV-2 by FDA under an Emergency Use Authorization (EUA). This EUA will remain in effect (meaning this test can be used) for the duration of the COVID-19 declaration under Section 564(b)(1) of the Act, 21 U.S.C. section 360bbb-3(b)(1), unless the authorization is terminated or revoked.  Performed at [facility], [place] [address] [place], [place] [address]   Blood Culture (routine x 2)     Status: None   Collection Time: 01/07/22  5:17 PM   Specimen: BLOOD  Result Value Ref Range Status   Specimen Description   Final    BLOOD RIGHT ANTECUBITAL Performed at [place] [address], [place], [place] [address]    Special Requests   Final    BOTTLES DRAWN AEROBIC ONLY Blood Culture results may not be optimal due to an inadequate volume of blood received in culture bottles Performed at [place]  [address], [place], [place] [address]    Culture   Final    NO GROWTH 5 DAYS Performed at [facility], [place] [address] [place], [place] [address]    Report Status 01/12/2022 FINAL  Final      Studies:  DG Abd 1 View  Result Date: 01/12/2022 CLINICAL DATA:  Abdominal distention EXAM: ABDOMEN - 1 VIEW COMPARISON:  CT done on 01/07/2022 FINDINGS: Bowel gas pattern is nonspecific. Small amount of stool is seen in colon. Liver appears to be enlarged in size. No abnormal calcifications are seen. IUD is seen in pelvis. There is stent in the course of first and second portions of duodenum. Pacemaker leads are seen in the heart. IMPRESSION: Nonspecific bowel gas pattern. Stent is seen in the region of duodenum. Electronically Signed   By: Elmer Picker M.D.   On: 01/12/2022 14:50    Assessment: 61 y.o. female   Gastric outlet obstruction, likely secondary to pancreatic mass, s/p duodenal stent placement Pancreatic cancer with liver metastasis, on chemotherapy, last dose 12/29/2021 Sepsis secondary to pneumonia  PAF DM Obesity  CKD     Plan:  -unfortunately she is still not able to tolerate oral intake after duodenal stent placement. -I discussed option of PEG tube placement for venting (so she can take oral liquid for pleasure), and possible with J-feeding tube placement through PEG by IR. I spoke with IR yesterday and today and they can do it -Patient and her family has agreed with hospice  -Patient expressed concerns of insufficient support at home and is open to residential hospice. If that's the case, I will not recommend PEG/J tube placement. Will check if it's possible for her to go to SNF with PEG/J tube with hospice support.  Her disability has been approved, she is applying for Medicaid. -I communicated with hospitalist Dr. Dwyane Dee and Palliative care Dr. Domingo Cocking about the above, and IR APP Lennette Bihari.  -I will f/u   Truitt Merle, MD 01/13/2022

## 2022-01-14 ENCOUNTER — Inpatient Hospital Stay (HOSPITAL_COMMUNITY): Payer: 59

## 2022-01-14 ENCOUNTER — Encounter (HOSPITAL_COMMUNITY): Payer: Self-pay | Admitting: Interventional Radiology

## 2022-01-14 DIAGNOSIS — K311 Adult hypertrophic pyloric stenosis: Secondary | ICD-10-CM | POA: Diagnosis not present

## 2022-01-14 HISTORY — PX: IR GASTROSTOMY TUBE MOD SED: IMG625

## 2022-01-14 LAB — CBC WITH DIFFERENTIAL/PLATELET
Abs Immature Granulocytes: 0.06 10*3/uL (ref 0.00–0.07)
Basophils Absolute: 0 10*3/uL (ref 0.0–0.1)
Basophils Relative: 0 %
Eosinophils Absolute: 0.4 10*3/uL (ref 0.0–0.5)
Eosinophils Relative: 3 %
HCT: 25.5 % — ABNORMAL LOW (ref 36.0–46.0)
Hemoglobin: 8 g/dL — ABNORMAL LOW (ref 12.0–15.0)
Immature Granulocytes: 0 %
Lymphocytes Relative: 14 %
Lymphs Abs: 2 10*3/uL (ref 0.7–4.0)
MCH: 29.5 pg (ref 26.0–34.0)
MCHC: 31.4 g/dL (ref 30.0–36.0)
MCV: 94.1 fL (ref 80.0–100.0)
Monocytes Absolute: 1.2 10*3/uL — ABNORMAL HIGH (ref 0.1–1.0)
Monocytes Relative: 9 %
Neutro Abs: 10.7 10*3/uL — ABNORMAL HIGH (ref 1.7–7.7)
Neutrophils Relative %: 74 %
Platelets: 290 10*3/uL (ref 150–400)
RBC: 2.71 MIL/uL — ABNORMAL LOW (ref 3.87–5.11)
RDW: 22 % — ABNORMAL HIGH (ref 11.5–15.5)
WBC: 14.4 10*3/uL — ABNORMAL HIGH (ref 4.0–10.5)
nRBC: 0 % (ref 0.0–0.2)

## 2022-01-14 LAB — BASIC METABOLIC PANEL
Anion gap: 7 (ref 5–15)
BUN: 9 mg/dL (ref 6–20)
CO2: 27 mmol/L (ref 22–32)
Calcium: 7.8 mg/dL — ABNORMAL LOW (ref 8.9–10.3)
Chloride: 105 mmol/L (ref 98–111)
Creatinine, Ser: 0.8 mg/dL (ref 0.44–1.00)
GFR, Estimated: >60 mL/min (ref >60)
Glucose, Bld: 191 mg/dL — ABNORMAL HIGH (ref 70–99)
Potassium: 3.5 mmol/L (ref 3.5–5.1)
Sodium: 139 mmol/L (ref 135–145)

## 2022-01-14 LAB — HEPARIN LEVEL (UNFRACTIONATED): Heparin Unfractionated: 0.59 IU/mL (ref 0.30–0.70)

## 2022-01-14 LAB — GLUCOSE, CAPILLARY
Glucose-Capillary: 151 mg/dL — ABNORMAL HIGH (ref 70–99)
Glucose-Capillary: 154 mg/dL — ABNORMAL HIGH (ref 70–99)
Glucose-Capillary: 164 mg/dL — ABNORMAL HIGH (ref 70–99)
Glucose-Capillary: 167 mg/dL — ABNORMAL HIGH (ref 70–99)
Glucose-Capillary: 174 mg/dL — ABNORMAL HIGH (ref 70–99)
Glucose-Capillary: 180 mg/dL — ABNORMAL HIGH (ref 70–99)

## 2022-01-14 LAB — PROTIME-INR
INR: 1.6 — ABNORMAL HIGH (ref 0.8–1.2)
Prothrombin Time: 19 seconds — ABNORMAL HIGH (ref 11.4–15.2)

## 2022-01-14 LAB — PHOSPHORUS: Phosphorus: 1.9 mg/dL — ABNORMAL LOW (ref 2.5–4.6)

## 2022-01-14 LAB — MAGNESIUM: Magnesium: 1.6 mg/dL — ABNORMAL LOW (ref 1.7–2.4)

## 2022-01-14 MED ORDER — LIDOCAINE HCL (PF) 1 % IJ SOLN
INTRAMUSCULAR | Status: AC | PRN
  Administered 2022-01-14 (×2): 10 mL via INTRADERMAL

## 2022-01-14 MED ORDER — MIDAZOLAM HCL 2 MG/2ML IJ SOLN
INTRAMUSCULAR | Status: AC
  Filled 2022-01-14: qty 2

## 2022-01-14 MED ORDER — FENTANYL CITRATE (PF) 100 MCG/2ML IJ SOLN
INTRAMUSCULAR | Status: AC
  Filled 2022-01-14: qty 2

## 2022-01-14 MED ORDER — HEPARIN (PORCINE) 25000 UT/250ML-% IV SOLN
1350.0000 [IU]/h | INTRAVENOUS | Status: DC
  Administered 2022-01-14: 1350 [IU]/h via INTRAVENOUS
  Filled 2022-01-14: qty 250

## 2022-01-14 MED ORDER — POTASSIUM PHOSPHATES 15 MMOLE/5ML IV SOLN
30.0000 mmol | Freq: Once | INTRAVENOUS | Status: AC
  Administered 2022-01-14: 30 mmol via INTRAVENOUS
  Filled 2022-01-14: qty 10

## 2022-01-14 MED ORDER — GLUCAGON HCL RDNA (DIAGNOSTIC) 1 MG IJ SOLR
INTRAMUSCULAR | Status: AC
  Filled 2022-01-14: qty 1

## 2022-01-14 MED ORDER — LIDOCAINE VISCOUS HCL 2 % MT SOLN
OROMUCOSAL | Status: AC
  Filled 2022-01-14: qty 15

## 2022-01-14 MED ORDER — MAGNESIUM SULFATE 2 GM/50ML IV SOLN
2.0000 g | Freq: Once | INTRAVENOUS | Status: AC
  Administered 2022-01-14: 2 g via INTRAVENOUS
  Filled 2022-01-14: qty 50

## 2022-01-14 MED ORDER — MIDAZOLAM HCL 2 MG/2ML IJ SOLN
INTRAMUSCULAR | Status: AC | PRN
  Administered 2022-01-14: 1 mg via INTRAVENOUS

## 2022-01-14 MED ORDER — MIDAZOLAM HCL 2 MG/2ML IJ SOLN
INTRAMUSCULAR | Status: AC | PRN
  Administered 2022-01-14: .5 mg via INTRAVENOUS

## 2022-01-14 MED ORDER — LIDOCAINE HCL URETHRAL/MUCOSAL 2 % EX GEL
CUTANEOUS | Status: AC | PRN
  Administered 2022-01-14: 1

## 2022-01-14 MED ORDER — LIDOCAINE HCL 1 % IJ SOLN
INTRAMUSCULAR | Status: AC
  Filled 2022-01-14: qty 20

## 2022-01-14 MED ORDER — FENTANYL CITRATE (PF) 100 MCG/2ML IJ SOLN
INTRAMUSCULAR | Status: AC | PRN
  Administered 2022-01-14: 50 ug via INTRAVENOUS

## 2022-01-14 MED ORDER — IOHEXOL 300 MG/ML  SOLN
50.0000 mL | Freq: Once | INTRAMUSCULAR | Status: AC | PRN
  Administered 2022-01-14: 40 mL

## 2022-01-14 NOTE — Procedures (Signed)
Interventional Radiology Procedure Note  Procedure: Placement of percutaneous GJ tube for venting of stomach and post duodenal stent feeding.   Complications: None  Estimated Blood Loss: None  Recommendations: - May begin using Jejunal lumen for feeds now.  - Use G-port for venting.     Signed,  Criselda Peaches, MD

## 2022-01-14 NOTE — Progress Notes (Signed)
[place] for Heparin Indication: atrial fibrillation- bridge therapy while PTA apixaban held  Allergies  Allergen Reactions   Penicillins Shortness Of Breath, Itching and Swelling    Has patient had a PCN reaction causing immediate rash, facial/tongue/throat swelling, SOB or lightheadedness with hypotension: Yes Has patient had a PCN reaction causing severe rash involving mucus membranes or skin necrosis:No Has patient had a PCN reaction that required hospitalization: No Has patient had a PCN reaction occurring within the last 10 years: no If all of the above answers are "NO", then may proceed with Cephalosporin use.  *Per patient, tolerated Keflex in the past      Patient Measurements: Height: 5\' 8"  (172.7 cm) Weight: 136 kg (299 lb 13.2 oz) IBW/kg (Calculated) : 63.9 Heparin Dosing Weight: 96.7 kg  Vital Signs: Temp: 98.4 F (36.9 C) (02/08 0407) Temp Source: Oral (02/08 0407) BP: 156/79 (02/08 0407) Pulse Rate: 95 (02/08 0407)  Labs: Recent Labs    01/12/22 0442 01/13/22 0455 01/13/22 2027 01/14/22 0447  HGB 8.5* 7.4*  --  8.0*  HCT 28.0* 24.7*  --  25.5*  PLT 276 247  --  290  APTT  --   --  59*  --   LABPROT  --   --   --  19.0*  INR  --   --   --  1.6*  HEPARINUNFRC 0.47  --  0.61 0.59  CREATININE 0.93 0.89  --   --      Estimated Creatinine Clearance: 99.6 mL/min (by C-G formula based on SCr of 0.89 mg/dL).  Assessment: 61 yo F on apixaban 5 mg po BID PTA for PAF, admitted for gastric outlet obstruction secondary to pancreatic head lesion. Pharmacy to dose heparin for bridge therapy while apixaban held.   Apixaban resumed 2/6 when advanced to clear liquids - one dose given at 18:19.  Now transitioning back to IV heparin in anticipation of G-tube placement by IR, possibly 2/8.  Today, 01/14/2022 HL 0.59 therapeutic on 1350 units/hr No bleeding reported SCr stable 0.89  Goal of Therapy:  Heparin level 0.3-0.7  units/ml Monitor platelets by anticoagulation protocol: Yes   Plan:  Continue heparin drip at 1350 units/hr Daily CBC and heparin level Monitor closely for s/sx of bleeding Follow up timing of IR procedure, plan to hold heparin 4hrs prior to  [place] 01/14/2022, 5:07 AM

## 2022-01-14 NOTE — Progress Notes (Signed)
MEDICATION-RELATED CONSULT NOTE   IR Procedure Consult - Anticoagulant/Antiplatelet PTA/Inpatient Med List Review by Pharmacist    Procedure: G-tube placement    Completed: 2/8 @ 16:20  Post-Procedural bleeding risk per IR MD assessment: STANDARD  Antithrombotic medications on inpatient profile prior to procedure:   Heparin infusion running at 1350 units/hr; stopped prior to IR procedure PTA antithrombotic medications:  Eliquis (converted to UFH)    Recommended restart time per IR Post-Procedure Guidelines:  6 hrs after end of procedure   Other considerations:  no bleeding or complications noted per IR   Plan:     Resume heparin infusion at 1350 units/hr Check heparin level with AM labs F/u for transition back to [place], PharmD, BCPS [phone] 01/14/2022, 4:34 PM

## 2022-01-14 NOTE — Progress Notes (Signed)
Initial Nutrition Assessment  DOCUMENTATION CODES:   Morbid obesity  INTERVENTION:  Pt is at high risk of refeeding syndrome. Monitor magnesium, phosphorus, and potassium for at least 3 days.   -Recommend Ensure TID, each supplement provides 350 kcal and 20 grams of protein -TF recommendations:  Initiate [place] 1.4 @ 84m/hr via J-tube (if placed)  If tolerates, advance by 10 ml every 24 hours to goal rate of 65 ml/hr  This provides 2275 kcal, 100 grams of protein and 1170 ml H20 Recommend free water flushes of 140 ml every 4 hours (provides 840 ml)   NUTRITION DIAGNOSIS:   Increased nutrient needs related to cancer and cancer related treatments as evidenced by estimated needs.   GOAL:   Patient will meet greater than or equal to 90% of their needs   MONITOR:   PO intake, Diet advancement, Labs, Weight trends  REASON FOR ASSESSMENT:   LOS, NPO/Clear Liquid Diet    ASSESSMENT:   Pt is 61y.o. female with medical history significant of metastatic pancreatic cancer, HTN, HLD, neuropathy, endometrial cancer, CHB s/p PPM who presented to the ED with nausea/vomiting and hyperglycemia and admitted for gastric outlet obstruction. Pt was recently hospitalized from 1/26-1/30 for sepsis secondary to port infection.  Pt has been NPO on and off the last 7 days.   Met with pt at bedside. Pt resting upon entering room. Pt appeared very tired, but was able to obtain some information from pt. Per pt, pt reports that she was tolerating liquids well when diet was advanced. Per pt, she ate apple sauce, ice cream and drank juices and tolerated well. Per pt, she has been having some constipation that has been on and off for some months. She also reports a decreased appetite for the last 3-4 months, in which she typically has been consuming smaller volume meals/snacks. Pt also reports decreased appetite due to taste buds being off and metallic taste, especially when eating meats. Pt also  reports having early satiety at times. Pt reports using a walker or cane at baseline and states that she experiences dizziness occasionally.   Weight history reviewed.  Current weight recorded at 136 kg on 01/08/22. Unsure if stated or measured weight.  Weight has been trending down for the past few months. 150.1 kg on 10/14/21, 136 kg on 01/08/22. This represents a 9% weight loss within three months, which is significant for time frame, putting pt at increased nutritional risk. Per pt, pt has lost 20-25# within the last month.   Talked to pt's RN after visit and RN stated that pt is getting a G-tube today. Noted in chart that J-tube is possibly being placed. Will leave TF recommendations above.   Labs reviewed. CBGs: 149-164 x 24 hours, A1C: 6.7, Mg: 1.6, Phosphorus: 1.9  Medications reviewed and include: Novolog SSI, protonix, magnesium sulfate, potassium phosphate in dextrose 5%  NUTRITION - FOCUSED PHYSICAL EXAM:  Flowsheet Row Most Recent Value  Orbital Region Mild depletion  Upper Arm Region No depletion  Thoracic and Lumbar Region No depletion  Buccal Region No depletion  Temple Region Mild depletion  Clavicle Bone Region No depletion  Clavicle and Acromion Bone Region No depletion  Scapular Bone Region Mild depletion  Dorsal Hand No depletion  Patellar Region No depletion  Anterior Thigh Region No depletion  Posterior Calf Region No depletion  Edema (RD Assessment) Mild  Hair Reviewed  Eyes Reviewed  Mouth Reviewed  Skin Reviewed  Nails Reviewed  Diet Order:   Diet Order             Diet NPO time specified  Diet effective midnight                   EDUCATION NEEDS:   Not appropriate for education at this time  Skin:  Skin Assessment: Skin Integrity Issues: Skin Integrity Issues:: Other (Comment) Other: ecchymosis  Last BM:  2/3; type 3  Height:   Ht Readings from Last 1 Encounters:  01/08/22 '5\' 8"'  (1.727 m)    Weight:   Wt Readings from  Last 1 Encounters:  01/08/22 136 kg    Ideal Body Weight:  63.6 kg  BMI:  Body mass index is 45.59 kg/m.  Estimated Nutritional Needs:   Kcal:  2000 - 2200  Protein:  100 - 115 grams  Fluid:  >/= 2 L    Maryruth Hancock, Dietetic Intern 01/14/2022 2:34 PM

## 2022-01-14 NOTE — TOC Progression Note (Signed)
Transition of Care [facility]) - Progression Note    Patient Details  Name: Leslie Mullen MRN: [id number] Date of Birth: 13-Jan-1961  [facility] CM/SW Contact  Eisa Necaise, Juliann Pulse, RN Phone Number: 01/14/2022, 10:35 AM  Clinical Narrative: Noted palliative care following w/discussions-?JT;options home w/hospice vs Residential hospice. We have already informed team-hospice w/LTC is not an option for TOC to asst. Continue to monitor for recc & d/c plans.     Expected Discharge Plan:  (TBD) Barriers to Discharge: Continued Medical Work up  Expected Discharge Plan and Services Expected Discharge Plan:  (TBD)     Post Acute Care Choice: NA Living arrangements for the past 2 months: Single Family Home                                       Social Determinants of Health (SDOH) Interventions    Readmission Risk Interventions Readmission Risk Prevention Plan 01/08/2022  Transportation Screening Complete  Medication Review Press photographer) Complete  PCP or Specialist appointment within 3-5 days of discharge Complete  HRI or [place] Complete  SW Recovery Care/Counseling Consult Complete  [place] Not Applicable  Some recent data might be hidden

## 2022-01-14 NOTE — Progress Notes (Addendum)
I stopped by to check in on Ms. Leslie Mullen.  She made decision to pursue G tube placement and was in IR at time of attempted visit.  Will plan to f/u tomorrow.  Leslie Rough, MD [place] Palliative Medicine Team [phone]  NO CHARGE NOTE

## 2022-01-14 NOTE — Progress Notes (Addendum)
PROGRESS NOTE    Leslie Mullen  DPO:[id number] DOB: 1961/03/28 DOA: 01/07/2022 PCP: Sue Lush, PA-C    Brief Narrative: This 61 years old female with PMH significant for CHB, s/p PPM, DM 2, hypertension, metastatic pancreatic cancer, endometrial cancer, morbid obesity presented in the ED with complaints of nausea and vomiting.  She was recently hospitalized from 1/26 - 1/30 for sepsis secondary to port infection.  At that time she was sent home with 5 days of oral doxycycline.  Blood cultures were negative.  Patient has acutely developed nausea and vomiting,  She was unable to take anything down,  she has tried Compazine and antacids did not help.  She was feeling dizzy by end of day with worsening nausea and vomiting. Patient was admitted for sepsis secondary to pancreatic infection versus left lower lobe pneumonia. She is also found to have gastric outlet obstruction, general surgery, gastroenterology, oncology, palliative care consulted.  She underwent EGD with stent, nausea has improved.  Now she has recurrence of nausea and abdominal pain.  Dr. Annamaria Boots had long discussion with the patient, plan is for venting G-tube and possible J-tube feeding placement through PEG via IR.  Patient and family has agreed with hospice.  Assessment & Plan:   Principal Problem:   Gastric outlet obstruction Active Problems:   Type 2 diabetes mellitus with diabetic polyneuropathy, with long-term current use of insulin (HCC)   Paroxysmal atrial fibrillation (HCC)   AKI (acute kidney injury) ([place])   Pancreatic cancer metastasized to liver ([place])   Sepsis ([place])   Palliative care by specialist   Goals of care, counseling/discussion   General weakness  Sepsis sec. to pancreatic infection versus left lower lobe pneumonia: Completed course of antibiotics (cefepime,  Flagyl) for 5 days. Continue IV fluids.  Continue supportive care.  Procalcitonin 0.67>0.26.  Blood cultures no growth to date. Sepsis physiology  resolved.   Gastric outlet obstruction: Pancreatic cancer with mets to the liver: Patient with metastatic pancreatic cancer followed by Dr. Annamaria Boots on palliative chemotherapy presented with nausea, vomiting and found to have gastric outlet obstruction secondary to pancreatic head lesion.  Continued NG tube decompression.  She was kept NPO. Patient had poor prognosis and poor tolerance to chemotherapy.  She is not a candidate for more cytotoxic therapy.   Goals of care at this point is palliative and comfort. GI consulted, EGD attempted with possible stent placement.  She feels improved.  NG tube removed. Patient was tolerating clear liquid diet, without nausea and vomiting. GI recommended multiple small meals, plenty of fluids, antireflux measures. Patient tolerated clear liquid diet,  advanced to low residue diet  but has recurrence of Nausea and abdominal pain. Discussed with GI, x-ray abdomen showed stents was patent. Dr. Annamaria Boots had long discussion with the patient, Plan is for venting G-tube for comfort and possible J-tube feeding placement through PEG via IR.   Patient and family has agreed with hospice. She is scheduled for venting G-tube and J-tube for feeding with IR today.  Diabetes mellitus type 2: Continue sliding scale.  AKI: > Resolved. Could be due to dehydration, Dec. PO intake. Serum creatinine normalized with IV hydration. Avoid nephrotoxic medications CT abdomen without obstruction.  Essential hypertension: Hydralazine as needed. Resume home medication once patient is able to take p.o.  Paroxysmal A-fib: Heart rate controlled.  Hold Eliquis in anticipation for venting G-tube. Started on heparin gtt.  Hypokalemia/hypophosphatemia: Replaced.  Continue to monitor.  Normochromic normocytic anemia: Her hemoglobin has dropped from 8.0-7.4>6.1 Transfuse  1 PRBC, posttrans Hb 7.8>7.6>8.2>7.7>8.5>7.4>8.0 Continue to monitor H&H.  Hypomagnesemia: Replaced.  Continue to  monitor.  DVT prophylaxis: SCDs Code Status: Full code. Family Communication: No family at bed side. Disposition Plan:   Status is: Inpatient Remains inpatient appropriate because: Admitted for sepsis and gastric outlet obstruction in the setting of pancreatic metastatic carcinoma.  General surgery GI and oncology consulted.  Patient underwent EGD with stenting.  Tolerated well,  She is now tolerating clear liquid diet. Plan is for venting G-tube and possible J-tube feeding placement through PEG via IR.  Patient and family has agreed with hospice.  Patient is medically clear for discharge: No  Follow-up TOC for possible placement.  Consultants:  Oncology General surgery Gastroenterology Palliative care  Procedures: Gastrostomy tube  antimicrobials:   Anti-infectives (From admission, onward)    Start     Dose/Rate Route Frequency Ordered Stop   01/10/22 1500  ceFEPIme (MAXIPIME) 2 g in sodium chloride 0.9 % 100 mL IVPB  Status:  Discontinued        2 g 200 mL/hr over 30 Minutes Intravenous Every 8 hours 01/10/22 0926 01/12/22 0923   01/07/22 2000  ceFEPIme (MAXIPIME) 2 g in sodium chloride 0.9 % 100 mL IVPB  Status:  Discontinued        2 g 200 mL/hr over 30 Minutes Intravenous Every 12 hours 01/07/22 0935 01/10/22 0926   01/07/22 1800  metroNIDAZOLE (FLAGYL) IVPB 500 mg  Status:  Discontinued        500 mg 100 mL/hr over 60 Minutes Intravenous Every 12 hours 01/07/22 1159 01/12/22 0923   01/07/22 0645  ceFEPIme (MAXIPIME) 2 g in sodium chloride 0.9 % 100 mL IVPB        2 g 200 mL/hr over 30 Minutes Intravenous  Once 01/07/22 0641 01/07/22 0822   01/07/22 0645  metroNIDAZOLE (FLAGYL) IVPB 500 mg        500 mg 100 mL/hr over 60 Minutes Intravenous  Once 01/07/22 0641 01/07/22 0757        Subjective: Patient was seen and examined at bedside.  Overnight events noted.   She still remains nauseous and complains of pain, asking for more pain medications She is able to pass  flatus.  She is scheduled to have a venting G-tube and J-tube for feeding by IR today.  Objective: Vitals:   01/13/22 0348 01/13/22 1417 01/13/22 2106 01/14/22 0407  BP: (!) 168/71 136/70 (!) 155/74 (!) 156/79  Pulse: 99 86 88 95  Resp: 18 18 18 18   Temp: 98.4 F (36.9 C) 98.2 F (36.8 C) 98.8 F (37.1 C) 98.4 F (36.9 C)  TempSrc: Oral Oral Oral Oral  SpO2: 100% 100% 100% 100%  Weight:      Height:        Intake/Output Summary (Last 24 hours) at 01/14/2022 1235 Last data filed at 01/14/2022 0532 Gross per 24 hour  Intake 199.9 ml  Output 300 ml  Net -100.1 ml   Filed Weights   01/07/22 0500 01/07/22 0502 01/08/22 1229  Weight: (!) 137.8 kg 136.1 kg 136 kg    Examination:  General exam: Appears comfortable, deconditioned, not in any distress. Respiratory system: Clear to auscultation bilaterally, respiratory normal, RR 15 Cardiovascular system: S1 & S2 heard, regular rate and rhythm, no murmur. Gastrointestinal system: Abdomen is soft,  mildly tender, non distended, BS+ Central nervous system: Alert and oriented x 3. No focal neurological deficits. Extremities: Bilateral pedal edema, no cyanosis no clubbing. Skin: No rashes, lesions  or ulcers Psychiatry: Judgement and insight appear normal. Mood & affect appropriate.     Data Reviewed: I have personally reviewed following labs and imaging studies  CBC: Recent Labs  Lab 01/10/22 0555 01/10/22 1415 01/11/22 0019 01/12/22 0442 01/13/22 0455 01/14/22 0447  WBC 10.4  --  11.0* 14.0* 13.4* 14.4*  NEUTROABS  --   --   --   --   --  10.7*  HGB 7.6* 8.2* 7.7* 8.5* 7.4* 8.0*  HCT 24.5* 25.9* 24.9* 28.0* 24.7* 25.5*  MCV 90.7  --  94.0 94.6 95.0 94.1  PLT 194  --  219 276 247 921   Basic Metabolic Panel: Recent Labs  Lab 01/09/22 0043 01/10/22 0555 01/11/22 0019 01/12/22 0442 01/13/22 0455 01/14/22 0447  NA 139 136 136 139 139 139  K 2.8* 2.9* 3.1* 3.9 3.5 3.5  CL 97* 95* 99 103 105 105  CO2 36* 31 31 29 26  27   GLUCOSE 251* 193* 171* 184* 210* 191*  BUN 30* 19 17 14 11 9   CREATININE 1.52* 1.16* 0.96 0.93 0.89 0.80  CALCIUM 7.0* 6.8* 6.9* 7.6* 7.6* 7.8*  MG 2.0  --  1.7 1.7  --  1.6*  PHOS 3.2  --  2.0* 2.8  --  1.9*   GFR: Estimated Creatinine Clearance: 110.8 mL/min (by C-G formula based on SCr of 0.8 mg/dL). Liver Function Tests: Recent Labs  Lab 01/08/22 0457  AST 14*  ALT 10  ALKPHOS 57  BILITOT 0.9  PROT 5.4*  ALBUMIN 1.9*   No results for input(s): LIPASE, AMYLASE in the last 168 hours.  No results for input(s): AMMONIA in the last 168 hours. Coagulation Profile: Recent Labs  Lab 01/08/22 0457 01/14/22 0447  INR 1.7* 1.6*   Cardiac Enzymes: No results for input(s): CKTOTAL, CKMB, CKMBINDEX, TROPONINI in the last 168 hours.  BNP (last 3 results) No results for input(s): PROBNP in the last 8760 hours. HbA1C: No results for input(s): HGBA1C in the last 72 hours.  CBG: Recent Labs  Lab 01/13/22 1708 01/13/22 2020 01/13/22 2356 01/14/22 0405 01/14/22 0734  GLUCAP 148* 159* 149* 174* 164*   Lipid Profile: No results for input(s): CHOL, HDL, LDLCALC, TRIG, CHOLHDL, LDLDIRECT in the last 72 hours. Thyroid Function Tests: No results for input(s): TSH, T4TOTAL, FREET4, T3FREE, THYROIDAB in the last 72 hours. Anemia Panel: No results for input(s): VITAMINB12, FOLATE, FERRITIN, TIBC, IRON, RETICCTPCT in the last 72 hours. Sepsis Labs: Recent Labs  Lab 01/07/22 1240 01/07/22 1717 01/08/22 0457  PROCALCITON  --   --  0.26  LATICACIDVEN 1.6 2.1*  --     Recent Results (from the past 240 hour(s))  Blood Culture (routine x 2)     Status: None   Collection Time: 01/07/22  5:59 AM   Specimen: Site Not Specified; Blood  Result Value Ref Range Status   Specimen Description   Final    SITE NOT SPECIFIED Performed at [facility] [address], [place], [place] [address]    Special Requests   Final    BOTTLES DRAWN AEROBIC AND ANAEROBIC  Blood Culture adequate volume Performed at [facility] [address] [place], [place] [address]    Culture   Final    NO GROWTH 5 DAYS Performed at [facility], [place] [address], [place], [place] [address]    Report Status 01/12/2022 FINAL  Final  Urine Culture     Status: None   Collection Time:  01/07/22  5:59 AM   Specimen: In/Out Cath Urine  Result Value Ref Range Status   Specimen Description   Final    IN/OUT CATH URINE Performed at [facility], [place] [address] [place], [place] [address]    Special Requests   Final    NONE Performed at [facility], [place] [address] [place], [place] [address]    Culture   Final    NO GROWTH Performed at [facility], [place] [address], [place], [place] [address]    Report Status 01/09/2022 FINAL  Final  Resp Panel by RT-PCR (Flu A&B, Covid) Nasopharyngeal Swab     Status: None   Collection Time: 01/07/22  8:38 AM   Specimen: Nasopharyngeal Swab; Nasopharyngeal(NP) swabs in vial transport medium  Result Value Ref Range Status   SARS Coronavirus 2 by RT PCR NEGATIVE NEGATIVE Final    Comment: (NOTE) SARS-CoV-2 target nucleic acids are NOT DETECTED.  The SARS-CoV-2 RNA is generally detectable in upper respiratory specimens during the acute phase of infection. The lowest concentration of SARS-CoV-2 viral copies this assay can detect is 138 copies/mL. A negative result does not preclude SARS-Cov-2 infection and should not be used as the sole basis for treatment or other patient management decisions. A negative result may occur with  improper specimen collection/handling, submission of specimen other than nasopharyngeal swab, presence of viral mutation(s) within the areas targeted by this assay, and inadequate number of viral copies(<138 copies/mL). A negative result must be combined with clinical observations, patient history, and epidemiological information. The expected  result is Negative.  Fact Sheet for Patients:  EntrepreneurPulse.com.au  Fact Sheet for Healthcare Providers:  IncredibleEmployment.be  This test is no t yet approved or cleared by the [place] FDA and  has been authorized for detection and/or diagnosis of SARS-CoV-2 by FDA under an Emergency Use Authorization (EUA). This EUA will remain  in effect (meaning this test can be used) for the duration of the COVID-19 declaration under Section 564(b)(1) of the Act, 21 U.S.C.section 360bbb-3(b)(1), unless the authorization is terminated  or revoked sooner.       Influenza A by PCR NEGATIVE NEGATIVE Final   Influenza B by PCR NEGATIVE NEGATIVE Final    Comment: (NOTE) The Xpert Xpress SARS-CoV-2/FLU/RSV plus assay is intended as an aid in the diagnosis of influenza from Nasopharyngeal swab specimens and should not be used as a sole basis for treatment. Nasal washings and aspirates are unacceptable for Xpert Xpress SARS-CoV-2/FLU/RSV testing.  Fact Sheet for Patients: EntrepreneurPulse.com.au  Fact Sheet for Healthcare Providers: IncredibleEmployment.be  This test is not yet approved or cleared by the [place] FDA and has been authorized for detection and/or diagnosis of SARS-CoV-2 by FDA under an Emergency Use Authorization (EUA). This EUA will remain in effect (meaning this test can be used) for the duration of the COVID-19 declaration under Section 564(b)(1) of the Act, 21 U.S.C. section 360bbb-3(b)(1), unless the authorization is terminated or revoked.  Performed at [facility], [place] [address], [place], [place] [address]   Blood Culture (routine x 2)     Status: None   Collection Time: 01/07/22  5:17 PM   Specimen: BLOOD  Result Value Ref Range Status   Specimen Description   Final    BLOOD RIGHT ANTECUBITAL Performed at [facility] [address], [place], [place] [address]    Special Requests   Final    BOTTLES DRAWN AEROBIC ONLY Blood  Culture results may not be optimal due to an inadequate volume of blood received in culture bottles Performed at [facility] [address] [place], [place] [address]    Culture   Final    NO GROWTH 5 DAYS Performed at [facility], [place] [address] [place], [place] [address]    Report Status 01/12/2022 FINAL  Final    Radiology Studies: DG Abd 1 View  Result Date: 01/12/2022 CLINICAL DATA:  Abdominal distention EXAM: ABDOMEN - 1 VIEW COMPARISON:  CT done on 01/07/2022 FINDINGS: Bowel gas pattern is nonspecific. Small amount of stool is seen in colon. Liver appears to be enlarged in size. No abnormal calcifications are seen. IUD is seen in pelvis. There is stent in the course of first and second portions of duodenum. Pacemaker leads are seen in the heart. IMPRESSION: Nonspecific bowel gas pattern. Stent is seen in the region of duodenum. Electronically Signed   By: Elmer Picker M.D.   On: 01/12/2022 14:50    Scheduled Meds:  sodium chloride   Intravenous Once   calcium carbonate  1 tablet Oral BID WC   chlorhexidine  15 mL Mouth Rinse BID   insulin aspart  0-20 Units Subcutaneous TID WC   insulin aspart  0-5 Units Subcutaneous QHS   mouth rinse  15 mL Mouth Rinse q12n4p    morphine injection  2 mg Intravenous Once   pantoprazole (PROTONIX) IV  40 mg Intravenous Q12H   Continuous Infusions:  sodium chloride 100 mL/hr at 01/14/22 0853   sodium chloride Stopped (01/08/22 1220)   heparin Stopped (01/14/22 0930)   potassium PHOSPHATE IVPB (in mmol) 30 mmol (01/14/22 1231)   promethazine (PHENERGAN) injection (IM or IVPB)       LOS: 7 days    Time spent: 35 mins    Kolston Lacount, MD [facility]   If 7PM-7AM, please contact night-coverage

## 2022-01-14 NOTE — Progress Notes (Signed)
Remote pacemaker transmission.   

## 2022-01-14 NOTE — Progress Notes (Signed)
Pt and family have agreed to proceed with g-tube placement. Consent signed in patient chart. Pt aware that procedure is scheduled for 13:30 today.   Narda Rutherford, AGNP-BC 01/14/2022, 11:55 AM

## 2022-01-15 DIAGNOSIS — I480 Paroxysmal atrial fibrillation: Secondary | ICD-10-CM

## 2022-01-15 DIAGNOSIS — N179 Acute kidney failure, unspecified: Secondary | ICD-10-CM

## 2022-01-15 DIAGNOSIS — Z515 Encounter for palliative care: Secondary | ICD-10-CM | POA: Diagnosis not present

## 2022-01-15 DIAGNOSIS — Z794 Long term (current) use of insulin: Secondary | ICD-10-CM

## 2022-01-15 DIAGNOSIS — K311 Adult hypertrophic pyloric stenosis: Secondary | ICD-10-CM | POA: Diagnosis not present

## 2022-01-15 DIAGNOSIS — E1142 Type 2 diabetes mellitus with diabetic polyneuropathy: Secondary | ICD-10-CM

## 2022-01-15 DIAGNOSIS — G893 Neoplasm related pain (acute) (chronic): Secondary | ICD-10-CM

## 2022-01-15 DIAGNOSIS — R531 Weakness: Secondary | ICD-10-CM | POA: Diagnosis not present

## 2022-01-15 DIAGNOSIS — A419 Sepsis, unspecified organism: Principal | ICD-10-CM

## 2022-01-15 DIAGNOSIS — C259 Malignant neoplasm of pancreas, unspecified: Secondary | ICD-10-CM | POA: Diagnosis not present

## 2022-01-15 DIAGNOSIS — Z7189 Other specified counseling: Secondary | ICD-10-CM | POA: Diagnosis not present

## 2022-01-15 LAB — CBC
HCT: 26.4 % — ABNORMAL LOW (ref 36.0–46.0)
Hemoglobin: 8 g/dL — ABNORMAL LOW (ref 12.0–15.0)
MCH: 29.1 pg (ref 26.0–34.0)
MCHC: 30.3 g/dL (ref 30.0–36.0)
MCV: 96 fL (ref 80.0–100.0)
Platelets: 281 10*3/uL (ref 150–400)
RBC: 2.75 MIL/uL — ABNORMAL LOW (ref 3.87–5.11)
RDW: 22.4 % — ABNORMAL HIGH (ref 11.5–15.5)
WBC: 10.8 10*3/uL — ABNORMAL HIGH (ref 4.0–10.5)
nRBC: 0 % (ref 0.0–0.2)

## 2022-01-15 LAB — HEPARIN LEVEL (UNFRACTIONATED)
Heparin Unfractionated: 0.16 IU/mL — ABNORMAL LOW (ref 0.30–0.70)
Heparin Unfractionated: 0.23 IU/mL — ABNORMAL LOW (ref 0.30–0.70)

## 2022-01-15 LAB — GLUCOSE, CAPILLARY
Glucose-Capillary: 114 mg/dL — ABNORMAL HIGH (ref 70–99)
Glucose-Capillary: 123 mg/dL — ABNORMAL HIGH (ref 70–99)
Glucose-Capillary: 132 mg/dL — ABNORMAL HIGH (ref 70–99)
Glucose-Capillary: 152 mg/dL — ABNORMAL HIGH (ref 70–99)
Glucose-Capillary: 178 mg/dL — ABNORMAL HIGH (ref 70–99)
Glucose-Capillary: 182 mg/dL — ABNORMAL HIGH (ref 70–99)

## 2022-01-15 MED ORDER — HEPARIN (PORCINE) 25000 UT/250ML-% IV SOLN
1450.0000 [IU]/h | INTRAVENOUS | Status: AC
  Filled 2022-01-15: qty 250

## 2022-01-15 MED ORDER — HYDROMORPHONE HCL 2 MG PO TABS
6.0000 mg | ORAL_TABLET | ORAL | Status: DC | PRN
  Administered 2022-01-15 – 2022-01-19 (×18): 6 mg
  Filled 2022-01-15 (×18): qty 3

## 2022-01-15 MED ORDER — HYDROMORPHONE HCL 1 MG/ML IJ SOLN
1.0000 mg | INTRAMUSCULAR | Status: DC | PRN
  Administered 2022-01-15 – 2022-01-25 (×11): 1 mg via INTRAVENOUS
  Filled 2022-01-15 (×11): qty 1

## 2022-01-15 MED ORDER — APIXABAN 5 MG PO TABS
5.0000 mg | ORAL_TABLET | Freq: Two times a day (BID) | ORAL | Status: DC
  Administered 2022-01-15 – 2022-01-16 (×3): 5 mg
  Filled 2022-01-15 (×4): qty 1

## 2022-01-15 MED ORDER — FENTANYL 100 MCG/HR TD PT72
1.0000 | MEDICATED_PATCH | TRANSDERMAL | Status: DC
  Administered 2022-01-15: 1 via TRANSDERMAL
  Filled 2022-01-15: qty 1

## 2022-01-15 NOTE — Assessment & Plan Note (Addendum)
Patient is on Synjardy as an outpatient which was held while inpatient. Continue Synjardy on discharge. Start Tresiba 10 units daily.

## 2022-01-15 NOTE — Progress Notes (Addendum)
ANTICOAGULATION CONSULT NOTE - Follow Up Consult  Pharmacy Consult for Apixaban Indication: atrial fibrillation  Allergies  Allergen Reactions   Penicillins Shortness Of Breath, Itching and Swelling    Has patient had a PCN reaction causing immediate rash, facial/tongue/throat swelling, SOB or lightheadedness with hypotension: Yes Has patient had a PCN reaction causing severe rash involving mucus membranes or skin necrosis:No Has patient had a PCN reaction that required hospitalization: No Has patient had a PCN reaction occurring within the last 10 years: no If all of the above answers are "NO", then may proceed with Cephalosporin use.  *Per patient, tolerated Keflex in the past      Patient Measurements: Height: 5\' 8"  (172.7 cm) Weight: 136 kg (299 lb 13.2 oz) IBW/kg (Calculated) : 63.9 Heparin Dosing Weight: 96.7 kg  Vital Signs: Temp: 98 F (36.7 C) (02/09 1215) Temp Source: Oral (02/09 1215) BP: 163/88 (02/09 1215) Pulse Rate: 97 (02/09 1215)  Labs: Recent Labs    01/13/22 0455 01/13/22 2027 01/14/22 0447 01/15/22 0423  HGB 7.4*  --  8.0* 8.0*  HCT 24.7*  --  25.5* 26.4*  PLT 247  --  290 281  APTT  --  59*  --   --   LABPROT  --   --  19.0*  --   INR  --   --  1.6*  --   HEPARINUNFRC  --  0.61 0.59 0.16*  CREATININE 0.89  --  0.80  --     Estimated Creatinine Clearance: 110.8 mL/min (by C-G formula based on SCr of 0.8 mg/dL).   Medications:  Infusions:   sodium chloride 100 mL/hr at 01/14/22 2008   sodium chloride Stopped (01/08/22 1220)   heparin 1,450 Units/hr (01/15/22 0817)   promethazine (PHENERGAN) injection (IM or IVPB)      Assessment: 61 yo F on apixaban 5 mg po BID PTA for PAF, admitted for gastric outlet obstruction secondary to pancreatic head lesion.  Pharmacy was dosing heparin while Apixaban was held for G-tube placement by IR on 2/8.  She is now transitioned back to apixaban on 2/9 - discussed with both Dr. Burr Medico and Dr. Louanne Belton.     SCr 0.8 CBC: Hgb remains low/stable at 8, Plt WNL No bleeding or complications reported by RN Heparin level 0.23, subtherapeutic on heparin 1450 units/hr  Goal of Therapy:  Heparin level 0.3-0.7 units/ml Monitor platelets by anticoagulation protocol: Yes   Plan:  Resume Apixaban 5 mg PO BID D/C heparin drip at the same time Apixaban is restarted.  (Confirmed with RN) Pharmacy will sign off notes, but will continue to follow peripherally.     Gretta Arab PharmD, BCPS Clinical Pharmacist [facility] main pharmacy [phone] 01/15/2022 3:35 PM

## 2022-01-15 NOTE — Assessment & Plan Note (Addendum)
Thought to be secondary to pancreatic infection versus left lower lobe pneumonia.  Has completed cefepime and Flagyl for 5 days. Procalcitonin 0.67>0.26.  Blood cultures no growth. Sepsis physiology has resolved.

## 2022-01-15 NOTE — TOC Progression Note (Signed)
Transition of Care [facility]) - Progression Note    Patient Details  Name: Leslie Mullen MRN: [id number] Date of Birth: 13-May-1961  [facility]) CM/SW Contact  Bohdi Leeds, Juliann Pulse, RN Phone Number: 01/15/2022, 1:09 PM  Clinical Narrative: Referral for home w/hospice-spoke to patient/dtr [facility] about referral-chose [facility] rep Brandon Melnick will contact dtr [name]-await outcome. Noted-GJT in place.      Expected Discharge Plan: Home w Hospice Care Barriers to Discharge: Continued Medical Work up  Expected Discharge Plan and Services Expected Discharge Plan: [place] Acute Care Choice: NA Living arrangements for the past 2 months: Single Family Home                                       Social Determinants of Health (SDOH) Interventions    Readmission Risk Interventions Readmission Risk Prevention Plan 01/08/2022  Transportation Screening Complete  Medication Review [employer]) Complete  PCP or Specialist appointment within 3-5 days of discharge Complete  HRI or [place] Complete  SW Recovery Care/Counseling Consult Complete  [place] Not Applicable  Some recent data might be hidden

## 2022-01-15 NOTE — Assessment & Plan Note (Addendum)
Debility and deconditioning due to advanced cancer.  Continue supportive care.  Patient will be on hospice at home

## 2022-01-15 NOTE — Assessment & Plan Note (Addendum)
Repleted. Resolved.

## 2022-01-15 NOTE — Progress Notes (Signed)
Leslie Mullen for Heparin Indication: atrial fibrillation- bridge therapy while PTA apixaban held  Allergies  Allergen Reactions   Penicillins Shortness Of Breath, Itching and Swelling    Has patient had a PCN reaction causing immediate rash, facial/tongue/throat swelling, SOB or lightheadedness with hypotension: Yes Has patient had a PCN reaction causing severe rash involving mucus membranes or skin necrosis:No Has patient had a PCN reaction that required hospitalization: No Has patient had a PCN reaction occurring within the last 10 years: no If all of the above answers are "NO", then may proceed with Cephalosporin use.  *Per patient, tolerated Keflex in the past      Patient Measurements: Height: 5\' 8"  (172.7 cm) Weight: 136 kg (299 lb 13.2 oz) IBW/kg (Calculated) : 63.9 Heparin Dosing Weight: 96.7 kg  Vital Signs: Temp: 97.2 F (36.2 C) (02/09 0625) Temp Source: Oral (02/09 0625) BP: 147/78 (02/09 0625) Pulse Rate: 92 (02/09 0625)  Labs: Recent Labs    01/13/22 0455 01/13/22 2027 01/14/22 0447 01/15/22 0423  HGB 7.4*  --  8.0* 8.0*  HCT 24.7*  --  25.5* 26.4*  PLT 247  --  290 281  APTT  --  59*  --   --   LABPROT  --   --  19.0*  --   INR  --   --  1.6*  --   HEPARINUNFRC  --  0.61 0.59 0.16*  CREATININE 0.89  --  0.80  --      Estimated Creatinine Clearance: 110.8 mL/min (by C-G formula based on SCr of 0.8 mg/dL).  Assessment: 61 yo F on apixaban 5 mg po BID PTA for PAF, admitted for gastric outlet obstruction secondary to pancreatic head lesion. Pharmacy to dose heparin for bridge therapy while apixaban held.   Apixaban resumed 2/6 when advanced to clear liquids - one dose given at 18:19.  Now transitioning back to IV heparin in anticipation of G-tube placement by IR, possibly 2/8.  Today, 01/15/2022 HL 0.16, subtherapeutic on 1350 units/hr - suspicious of this level since had been therapeutic on this same rate.  Per RN,  drip had not been paused or interrupted overnight. No bleeding reported s/p G-tube placement yesterday CBC: Hgb low but stable, Plts wnl  Goal of Therapy:  Heparin level 0.3-0.7 units/ml Monitor platelets by anticoagulation protocol: Yes   Plan:  Increase heparin drip to 1450 units/hr Recheck heparin level in 6hrs Daily CBC and heparin level Monitor closely for s/sx of bleeding Follow plans for resuming Eliquis now that enteral access obtained  Peggyann Juba, PharmD, BCPS Pharmacy: [phone] 01/15/2022, 7:00 AM

## 2022-01-15 NOTE — Assessment & Plan Note (Addendum)
Decision made to discharge home with hospice.

## 2022-01-15 NOTE — Assessment & Plan Note (Addendum)
Patient with metastatic pancreatic cancer followed by Dr. Burr Medico as outpatient for palliative chemotherapy.  Presenting with gastric outlet obstruction secondary to pancreatic head lesion.  Was on NG tube during hospitalization for decompression.  Patient does have poor prognosis and poor tolerance to chemotherapy and is not a candidate for further cytotoxic treatment. Due to patient wishes for tube feeding, underwent gastrojejunostomy tube placement by IR on 01/24/2022.  Tube feedings have been initiated.  Plan for discharge home with hospice.

## 2022-01-15 NOTE — Assessment & Plan Note (Addendum)
Replenished. Resolved.

## 2022-01-15 NOTE — Progress Notes (Signed)
Oncology Brief follow up note   I stopped by to see Leslie Mullen, she was laying in bed, drowsy, occasional open eyes and answer simple questions, but cannot fully engage to have a meaningful conversation.  Her son-in-law was at the bedside.  We discussed discharge planning, patient and her family has agreed to hospice, they are hoping to go to a SNF with hospice on board, but they understand there is out-of-pocket cost for room and board.  Family are also thinking about bringing her home with hospice services. They will discuss tonight and let us know. Pt would like to start tube feeding in hospital, please call dietician for f/u and orders.   Truitt Merle  01/15/2022

## 2022-01-15 NOTE — Assessment & Plan Note (Addendum)
Resolved.  Thought to be secondary to volume depletion.  CT scan of the abdomen without any hydronephrosis.

## 2022-01-15 NOTE — Assessment & Plan Note (Addendum)
Heart rate is controlled.  On Eliquis for anticoagulation. Continue Eliquis.

## 2022-01-15 NOTE — Progress Notes (Signed)
PROGRESS NOTE    Leslie Mullen  UYQ:[id number] DOB: 05/03/1961 DOA: 01/07/2022 PCP: Sue Lush, PA-C    Brief Narrative:  Patient is a 61 years old female with PMH significant for CHB, s/p PPM, DM 2, hypertension, metastatic pancreatic cancer, endometrial cancer, morbid obesity presented in the ED with complaints of nausea and vomiting.  She was recently hospitalized from 1/26 - 1/30 for sepsis secondary to port infection.  At that time she was sent home with 5 days of oral doxycycline.  Blood cultures were negative.  Patient has acutely developed nausea and vomiting,  She was unable to take anything down,  she has tried Compazine and antacids did not help.  At this time, patient was admitted for sepsis secondary to pancreatic infection versus left lower lobe pneumonia. She is also found to have gastric outlet obstruction, general surgery, gastroenterology, oncology, palliative care consulted.  She underwent EGD with stent, nausea has improved.  Now she has recurrence of nausea and abdominal pain.  Dr. Burr Medico had long discussion with the patient, and underwent G- J-tube placement for venting of the stomach and post duodenal stent feeding by IR on 01/14/22  Patient and family has agreed with hospice.     Assessment and Plan: * Gastric outlet obstruction- (present on admission) Secondary to metastatic cancer.  Status post gastric jejunostomy tube with stent tube feeding.  Sepsis ([place])- (present on admission) Thought to be secondary to pancreatic infection versus left lower lobe pneumonia.  Has completed cefepime and Flagyl for 5 days.  Procalcitonin 0.67>0.26.  Blood cultures no growth to date. Sepsis physiology resolved.   Pancreatic cancer metastasized to liver [facility])- (present on admission) Patient with metastatic pancreatic cancer followed by Dr. Burr Medico as outpatient for palliative chemotherapy.  Presenting with gastric outlet obstruction secondary to pancreatic head lesion.  Was on NG tube during  hospitalization for decompression.  Patient does have poor prognosis and poor tolerance to chemotherapy and is not a candidate for further cytotoxic treatment.  GI was consulted at this time but ultimately underwent gastrojejunostomy tube placement by IR on 01/24/2022.  Plan for hospice at this time.  Palliative care on board.  Hypomagnesemia Mild.  We will continue to replenish.  Hypophosphatemia We will replenish.  General weakness Debility and deconditioning due to advanced cancer.  Continue supportive care.  Goals of care, counseling/discussion Palliative care on board.  Possibility of hospice.  AKI (acute kidney injury) ([place])- (present on admission) Resolved at this time.  Thought to be secondary to volume depletion.  CT scan of the abdomen without any hydronephrosis.  Paroxysmal atrial fibrillation ([place])- (present on admission) Heart rate is controlled.  Eliquis was on hold for G-tube placement.  Was on heparin drip.  Type 2 diabetes mellitus with diabetic polyneuropathy, with long-term current use of insulin (HCC) Continue sliding scale insulin Accu-Cheks.  Closely monitor.    DVT prophylaxis:   Heparin drip   Code Status:     Code Status: Full Code  Disposition: Possible hospice  Status is: Inpatient Remains inpatient appropriate because: Status post GJ tube, waiting for hospice    Family Communication: Spoke with the patient's husband and son in law at bedside.  Consultants:  Oncology General surgery Gastroenterology Palliative care  Procedures:  Gastrojejunostomy tube placement  Antimicrobials:  None  Anti-infectives (From admission, onward)    Start     Dose/Rate Route Frequency Ordered Stop   01/10/22 1500  ceFEPIme (MAXIPIME) 2 g in sodium chloride 0.9 % 100 mL IVPB  Status:  Discontinued        2 g 200 mL/hr over 30 Minutes Intravenous Every 8 hours 01/10/22 0926 01/12/22 0923   01/07/22 2000  ceFEPIme (MAXIPIME) 2 g in sodium chloride 0.9 % 100 mL  IVPB  Status:  Discontinued        2 g 200 mL/hr over 30 Minutes Intravenous Every 12 hours 01/07/22 0935 01/10/22 0926   01/07/22 1800  metroNIDAZOLE (FLAGYL) IVPB 500 mg  Status:  Discontinued        500 mg 100 mL/hr over 60 Minutes Intravenous Every 12 hours 01/07/22 1159 01/12/22 0923   01/07/22 0645  ceFEPIme (MAXIPIME) 2 g in sodium chloride 0.9 % 100 mL IVPB        2 g 200 mL/hr over 30 Minutes Intravenous  Once 01/07/22 0641 01/07/22 0822   01/07/22 0645  metroNIDAZOLE (FLAGYL) IVPB 500 mg        500 mg 100 mL/hr over 60 Minutes Intravenous  Once 01/07/22 0641 01/07/22 0757      Subjective: Today, patient was seen and examined at bedside.  Patient states that she has mild pain in the abdomen but no nausea or vomiting.  Feels sleepy.  Objective: Vitals:   01/14/22 1658 01/14/22 1953 01/15/22 0625 01/15/22 1215  BP: (!) 141/71 (!) 152/72 (!) 147/78 (!) 163/88  Pulse: 81 91 92 97  Resp: 18 18 18 18   Temp: 98.8 F (37.1 C) 98.7 F (37.1 C) (!) 97.2 F (36.2 C) 98 F (36.7 C)  TempSrc: Oral Oral Oral Oral  SpO2: 100% 99% 100% 100%  Weight:      Height:        Intake/Output Summary (Last 24 hours) at 01/15/2022 1435 Last data filed at 01/15/2022 3382 Gross per 24 hour  Intake 4983.06 ml  Output --  Net 4983.06 ml   Filed Weights   01/07/22 0500 01/07/22 0502 01/08/22 1229  Weight: (!) 137.8 kg 136.1 kg 136 kg    Physical Examination: General: Obese built, not in obvious distress, deconditioned HENT:   No scleral pallor or icterus noted. Oral mucosa is moist.  Chest:  Clear breath sounds.  Diminished breath sounds bilaterally. No crackles or wheezes.  CVS: S1 &S2 heard. No murmur.  Regular rate and rhythm. Abdomen: Soft, nontender, nondistended.  Bowel sounds are heard.   Extremities: No cyanosis, clubbing with bilateral pedal edema, peripheral pulses are palpable. Psych: Alert, awake and oriented, normal mood CNS:  No cranial nerve deficits.  Power equal in all  extremities.   Skin: Warm and dry.  No rashes noted.  Data Reviewed:   CBC: Recent Labs  Lab 01/11/22 0019 01/12/22 0442 01/13/22 0455 01/14/22 0447 01/15/22 0423  WBC 11.0* 14.0* 13.4* 14.4* 10.8*  NEUTROABS  --   --   --  10.7*  --   HGB 7.7* 8.5* 7.4* 8.0* 8.0*  HCT 24.9* 28.0* 24.7* 25.5* 26.4*  MCV 94.0 94.6 95.0 94.1 96.0  PLT 219 276 247 290 505    Basic Metabolic Panel: Recent Labs  Lab 01/09/22 0043 01/10/22 0555 01/11/22 0019 01/12/22 0442 01/13/22 0455 01/14/22 0447  NA 139 136 136 139 139 139  K 2.8* 2.9* 3.1* 3.9 3.5 3.5  CL 97* 95* 99 103 105 105  CO2 36* 31 31 29 26 27   GLUCOSE 251* 193* 171* 184* 210* 191*  BUN 30* 19 17 14 11 9   CREATININE 1.52* 1.16* 0.96 0.93 0.89 0.80  CALCIUM 7.0* 6.8* 6.9* 7.6* 7.6* 7.8*  MG 2.0  --  1.7 1.7  --  1.6*  PHOS 3.2  --  2.0* 2.8  --  1.9*    Liver Function Tests: No results for input(s): AST, ALT, ALKPHOS, BILITOT, PROT, ALBUMIN in the last 168 hours.   Radiology Studies: IR GASTROSTOMY TUBE MOD SED  Result Date: 01/14/2022 INDICATION: 61 year old female with pancreatic adenocarcinoma and malignant gastric outlet obstruction. She recently underwent duodenal stent placement. She presents for attempted percutaneous gastrostomy tube placement with J arm extension. EXAM: PERC PLACEMENT GASTROSTOMY MEDICATIONS: None ANESTHESIA/SEDATION: Versed 2 mg IV; Fentanyl 100 mcg IV Moderate Sedation Time:  45 minutes The patient's vital signs and level of consciousness were continuously monitored during the procedure by the interventional radiology nurse under my direct supervision. CONTRAST:  89mL OMNIPAQUE IOHEXOL 300 MG/ML SOLN - administered into the gastric lumen. FLUOROSCOPY TIME:  417 mGy air kerma COMPLICATIONS: None immediate. PROCEDURE: Informed written consent was obtained from the patient after a thorough discussion of the procedural risks, benefits and alternatives. All questions were addressed. Maximal Sterile Barrier  Technique was utilized including caps, mask, sterile gowns, sterile gloves, sterile drape, hand hygiene and skin antiseptic. A timeout was performed prior to the initiation of the procedure. A 5 French catheter was advanced through the mouth, down the esophagus and into the stomach. The stomach was insufflated with air. Under real-time fluoroscopic guidance, 2 T tacks were placed in the region of the junction of the gastric body and gastric antrum. Then, the anterior gastric wall was punctured with an 18 gauge single wall needle. Contrast was injected through the needle confirming presence of the needle tip within the gastric lumen. An Amplatz wire was then advanced into the stomach. The needle was removed. A 10 x 80 mm [employer] balloon was then advanced coaxially through a 67 [place] Entuit balloon retention gastrostomy tube. The balloon was advanced and positioned across the anterior abdominal wall tract into the stomach. The balloon was then inflated. As the balloon was deflated, the balloon and G-tube were advanced as a unit into the gastric lumen. The retention balloon was filled with 9 mL dilute contrast and pulled snug against the anterior abdominal wall. The Amplatz wire was removed. An angled catheter was advanced coaxially through the newly placed gastrostomy tube and was successfully advanced through the duodenal stent and into the proximal jejunum using a combination of wires. Ultimately, a superstiff Amplatz wire was advanced into the proximal jejunum. A 9 French J arm extension was then advanced over the wire and position with the tip in the proximal jejunum. Contrast was injected through both the jejunal and gastric lumens confirming that the tip of the J arm is indeed within the small bowel and the gastric lumen is within the stomach. The external bumper was secured in place. Images were obtained and stored for the medical record. IMPRESSION: Successful placement of a 20 French balloon retention  gastrostomy tube with a 9 [place] J arm extension. The tip of the J arm is within the small bowel and ready for immediate use. The G portion of the tube can be used for gastric decompression as needed. Electronically Signed   By: Jacqulynn Cadet M.D.   On: 01/14/2022 18:18      LOS: 8 days    Flora Lipps, MD [facility] 01/15/2022, 2:35 PM

## 2022-01-15 NOTE — Hospital Course (Addendum)
Patient is a 60 years old female with PMH significant for CHB, s/p PPM, DM 2, hypertension, metastatic pancreatic cancer, endometrial cancer, morbid obesity presented in the ED with complaints of nausea and vomiting.  She was recently hospitalized from 1/26 - 1/30 for sepsis secondary to port infection.  At that time she was sent home with 5 days of oral doxycycline.  Blood cultures were negative.  Patient has acutely developed nausea and vomiting,  She was unable to take anything down,  she has tried Compazine and antacids did not help.  At this time, patient was admitted for sepsis secondary to pancreatic infection versus left lower lobe pneumonia. She is also found to have gastric outlet obstruction, general surgery, gastroenterology, oncology, palliative care consulted.  She underwent EGD with stent, nausea has improved.  Now she has recurrence of nausea and abdominal pain.  Dr. Burr Medico had long discussion with the patient, and underwent G- J-tube placement for venting of the stomach and post duodenal stent feeding by IR on 01/14/22.  Patient has been initiated on tube feedings at this time. Patient has been considered for home hospice at this time with tube feedings.

## 2022-01-15 NOTE — Assessment & Plan Note (Addendum)
Secondary to metastatic cancer.  Status post gastric jejunostomy tube for duodenal stent feeding.  Patient has been initiated on tube feeding.  On full liquids as well.  Has been tolerating well but has some nausea today. Recommended decreasing PO intake and relying more on tube feedings via J-tube.

## 2022-01-15 NOTE — Progress Notes (Signed)
Oncology Discharge Planning Admission Note  [facility] Address: [address], [place], [place] [address] Hours of Operation:  8am - 5pm, Monday - Friday  Clinic Contact Information:  [phone]  Oncology Care Team: Medical Oncologist:  DR. Truitt Merle  Dr. Burr Medico is aware of this hospital admission dated 01/07/22 and has assessed patient at bedside.  The cancer center will follow [facility] inpatient care to assist with discharge planning as indicated by the oncologist.  We will arrange for follow up closer to discharge.  Disclaimer:  This [place] note does not imply a formal consult request has been made by the admitting attending for this admission or there will be an inpatient consult completed by oncology.  Please request oncology consults as per standard process as indicated.

## 2022-01-15 NOTE — Progress Notes (Signed)
Chaplain tried to engage in a follow-up visit with Leslie Mullen.  Leslie Mullen was sleeping.  Chaplain checked in with husband who voiced that he was doing well.  Chaplain offered support and will follow-up.     01/15/22 1200  Clinical Encounter Type  Visited With Patient and family together  Visit Type Follow-up

## 2022-01-16 DIAGNOSIS — C259 Malignant neoplasm of pancreas, unspecified: Secondary | ICD-10-CM | POA: Diagnosis not present

## 2022-01-16 DIAGNOSIS — N179 Acute kidney failure, unspecified: Secondary | ICD-10-CM | POA: Diagnosis not present

## 2022-01-16 DIAGNOSIS — K311 Adult hypertrophic pyloric stenosis: Secondary | ICD-10-CM | POA: Diagnosis not present

## 2022-01-16 DIAGNOSIS — Z7189 Other specified counseling: Secondary | ICD-10-CM | POA: Diagnosis not present

## 2022-01-16 DIAGNOSIS — Z515 Encounter for palliative care: Secondary | ICD-10-CM | POA: Diagnosis not present

## 2022-01-16 DIAGNOSIS — R531 Weakness: Secondary | ICD-10-CM | POA: Diagnosis not present

## 2022-01-16 LAB — HEPARIN LEVEL (UNFRACTIONATED): Heparin Unfractionated: 0.76 IU/mL — ABNORMAL HIGH (ref 0.30–0.70)

## 2022-01-16 LAB — GLUCOSE, CAPILLARY
Glucose-Capillary: 139 mg/dL — ABNORMAL HIGH (ref 70–99)
Glucose-Capillary: 136 mg/dL — ABNORMAL HIGH (ref 70–99)
Glucose-Capillary: 134 mg/dL — ABNORMAL HIGH (ref 70–99)
Glucose-Capillary: 105 mg/dL — ABNORMAL HIGH (ref 70–99)
Glucose-Capillary: 145 mg/dL — ABNORMAL HIGH (ref 70–99)

## 2022-01-16 LAB — PHOSPHORUS
Phosphorus: 2.3 mg/dL — ABNORMAL LOW (ref 2.5–4.6)
Phosphorus: 2.1 mg/dL — ABNORMAL LOW (ref 2.5–4.6)

## 2022-01-16 LAB — CBC
HCT: 24.4 % — ABNORMAL LOW (ref 36.0–46.0)
Hemoglobin: 7.6 g/dL — ABNORMAL LOW (ref 12.0–15.0)
MCH: 29.2 pg (ref 26.0–34.0)
MCHC: 31.1 g/dL (ref 30.0–36.0)
MCV: 93.8 fL (ref 80.0–100.0)
Platelets: 302 10*3/uL (ref 150–400)
RBC: 2.6 MIL/uL — ABNORMAL LOW (ref 3.87–5.11)
RDW: 22.7 % — ABNORMAL HIGH (ref 11.5–15.5)
WBC: 9.7 10*3/uL (ref 4.0–10.5)
nRBC: 0 % (ref 0.0–0.2)

## 2022-01-16 LAB — MAGNESIUM
Magnesium: 1.6 mg/dL — ABNORMAL LOW (ref 1.7–2.4)
Magnesium: 1.6 mg/dL — ABNORMAL LOW (ref 1.7–2.4)

## 2022-01-16 MED ORDER — K PHOS MONO-SOD PHOS DI & MONO 155-852-130 MG PO TABS
500.0000 mg | ORAL_TABLET | Freq: Three times a day (TID) | ORAL | Status: AC
Start: 2022-01-16 — End: 2022-01-17
  Administered 2022-01-16 – 2022-01-17 (×3): 500 mg via ORAL
  Filled 2022-01-16 (×3): qty 2

## 2022-01-16 MED ORDER — KATE FARMS STANDARD 1.4 PO LIQD
1560.0000 mL | ORAL | Status: DC
  Administered 2022-01-16: 600 mL
  Administered 2022-01-17 – 2022-01-23 (×7): 1560 mL
  Filled 2022-01-16 (×13): qty 1625

## 2022-01-16 MED ORDER — FREE WATER
100.0000 mL | Status: DC
  Administered 2022-01-16 – 2022-01-26 (×80): 100 mL

## 2022-01-16 MED ORDER — MAGNESIUM OXIDE -MG SUPPLEMENT 400 (240 MG) MG PO TABS
400.0000 mg | ORAL_TABLET | Freq: Two times a day (BID) | ORAL | Status: DC
  Administered 2022-01-16 – 2022-01-26 (×20): 400 mg via ORAL
  Filled 2022-01-16 (×20): qty 1

## 2022-01-16 MED ORDER — VITAL HIGH PROTEIN PO LIQD
1000.0000 mL | ORAL | Status: DC
  Filled 2022-01-16: qty 1000

## 2022-01-16 NOTE — Progress Notes (Signed)
Daily Progress Note   Patient Name: Leslie Mullen       Date: 01/16/2022 DOB: Mar 13, 1961  Age: 61 y.o. MRN#: [id number] Attending Physician: Flora Lipps, MD Primary Care Physician: Elinor Parkinson Admit Date: 01/07/2022  Reason for Consultation/Follow-up: Establishing goals of care, Hospice Evaluation, Non pain symptom management, and Pain control  Subjective: I met today with Ms. Leatherbury in conjunction with her daughter and liaison from hospice of the [place].  We reviewed that things most important to her are being able to spend time with family, having her symptoms well controlled, and having time to "get her affairs in order."  We discussed options for care moving forward and that most of these revolved around continuation of tube feeds as this is the only source of nutrition that she has.  Previously, we discussed option of trying to transition home with hospice support with continuation of tube feeds, however, she has J-tube for feeding, this is more complicated as it requires pump and cannot be done with bolus feeding.  Maura from hospice will clarify if this is something that they can get an exception to cover for a period of time.  We discussed that in light of continued progression of her cancer, care should be focused on interventions that are likely to allow her to achieve goal of getting back to home and spending time with family. I discussed with her and her daughter regarding heroic interventions at the end-of-life and they agree this would not be in line with prior expressed wishes for a natural death or be likely to lead to getting well enough to go back home.  Ms. Downie was in agreement with changing CODE STATUS to DO NOT RESUSCITATE.   Length of Stay: 9  Current  Medications: Scheduled Meds:   sodium chloride   Intravenous Once   apixaban  5 mg Per Tube BID   calcium carbonate  1 tablet Oral BID WC   chlorhexidine  15 mL Mouth Rinse BID   fentaNYL  1 patch Transdermal Q72H   free water  100 mL Per Tube Q3H   insulin aspart  0-20 Units Subcutaneous TID WC   insulin aspart  0-5 Units Subcutaneous QHS   magnesium oxide  400 mg Oral BID   mouth rinse  15 mL Mouth Rinse q12n4p  morphine injection  2 mg Intravenous Once   pantoprazole (PROTONIX) IV  40 mg Intravenous Q12H   phosphorus  500 mg Oral TID    Continuous Infusions:  sodium chloride 100 mL/hr at 01/16/22 1057   sodium chloride Stopped (01/08/22 1220)   feeding supplement (KATE FARMS STANDARD 1.4) 600 mL (01/16/22 1546)   promethazine (PHENERGAN) injection (IM or IVPB)      PRN Meds: sodium chloride, docusate sodium, HYDROmorphone (DILAUDID) injection, HYDROmorphone, metoprolol tartrate, prochlorperazine, promethazine (PHENERGAN) injection (IM or IVPB)  Physical Exam         Patient appears deconditioned  Regular work of breathing Awake and alert Has edema   Vital Signs: BP 140/61 (BP Location: Right Arm)    Pulse 86    Temp 98.7 F (37.1 C) (Oral)    Resp 18    Ht _0  (1.727 m)    Wt 136 kg    SpO2 100%    BMI 45.59 kg/m  SpO2: SpO2: 100 % O2 Device: O2 Device: Nasal Cannula O2 Flow Rate: O2 Flow Rate (L/min): 2 L/min  Intake/output summary:  Intake/Output Summary (Last 24 hours) at 01/16/2022 1843 Last data filed at 01/16/2022 0413 Gross per 24 hour  Intake 120 ml  Output 400 ml  Net -280 ml    LBM: Last BM Date: 01/15/22 Baseline Weight: Weight: (!) 137.8 kg Most recent weight: Weight: 136 kg       Palliative Assessment/Data:      Patient Active Problem List   Diagnosis Date Noted   Hypophosphatemia 01/15/2022   Hypomagnesemia 01/15/2022   Palliative care by specialist    Goals of care, counseling/discussion    General weakness    Gastric outlet  obstruction 01/07/2022   Sepsis ([place]) 01/02/2022   Dizziness 01/01/2022   SIRS (systemic inflammatory response syndrome) ([place]) 01/01/2022   Lactic acidosis 01/01/2022   Acute GI bleeding 12/18/2021   Symptomatic anemia 12/16/2021   Stage 3a chronic kidney disease (CKD) ([place]) 12/16/2021   Obesity, Class III, BMI 40-49.9 (morbid obesity) ([place]) 12/16/2021   Hyperlipidemia 12/16/2021   Anxiety 12/16/2021   Chronic pain 12/16/2021   Complete heart block ([place]) 12/16/2021   Port-A-Cath in place 11/18/2021   Family history of colon cancer 10/20/2021   Pancreatic cancer metastasized to liver ([place]) 10/14/2021   Pancreatic mass    Pancreatitis 10/05/2021   Leukocytosis 10/05/2021   AKI (acute kidney injury) ([place]) 10/05/2021   Chronic anticoagulation 10/05/2021   Thrombocytosis 10/05/2021   Paroxysmal atrial fibrillation ([place]) 09/18/2021   Secondary hypercoagulable state ([place]) 09/18/2021   Endometrial cancer ([place]) 07/29/2021   Heart block 10/10/2020   Fall at home, initial encounter 10/10/2020   Type 2 diabetes mellitus with diabetic polyneuropathy, with long-term current use of insulin ([place]) 01/31/2020   Type 2 diabetes mellitus with hypoglycemia ([place]) 01/31/2020   Type 2 diabetes mellitus with stage 3a chronic kidney disease, with long-term current use of insulin ([place]) 01/31/2020   Abnormal feces    Benign neoplasm of ascending colon    Benign neoplasm of cecum    Benign neoplasm of transverse colon     Palliative Care Assessment & Plan   Patient Profile:    Assessment: Palliative performance scale 40%. 61 year old lady with life limiting illness of metastatic pancreatic cancer, left lower lobe pneumonia, metastatic disease burden to liver, admitted with gastric outlet obstruction secondary to pancreatic head lesion, status post stenting.  GI colleagues following closely and patient's diet was gradually being advanced.  Recommendations/Plan: DNR/DNI.  S/p G/J tube. She reports  needing to settle her affairs and understands that this is the most likely way to extend her life.  She also understands this will be limited, likely a matter of weeks and limited number of months, even with more aggressive care including PEG/J-tube placement. She is interested in hospice services, but question if they can supply tube feeds as this would not be able to be done with bolus feeds but would require a pump as she has J-tube extension. Pain, cancer related: Reports fairly well-controlled on current regimen.  We will plan to continue fentanyl patch at 144mg/hr for baseline pain with trial of dilaudid 666mper tube every 3 hours as needed for breakthrough pain.  Will also leave additional dilaudid 11m5mV as second line medication to be given 45 minutes after per tube med if it is ineffective to relieve pain.     Code Status:    Code Status Orders  (From admission, onward)           Start     Ordered   01/07/22 1200  Full code  Continuous        01/07/22 1159           Code Status History     Date Active Date Inactive Code Status Order ID Comments User Context   01/01/2022 1149 01/05/2022 2054 Full Code [id number] MortonD ED   12/19/2021 1027 12/20/2021 1944 Full Code [id number] IshikawaD Inpatient   12/16/2021 1819 12/19/2021 1027 DNR [id number] AloeD Inpatient   10/05/2021 1053 10/09/2021 2244 Full Code [id number] MortonD ED   07/29/2021 0518 07/29/2021 1422 Full Code [id number] GibbsP Inpatient   10/10/2020 1620 10/12/2020 1902 Full Code [id number] FriarA-C ED   10/10/2020 1009 10/10/2020 1128 Full Code [id number] GibbsP Inpatient       Prognosis:  Guarded   Discharge Planning: To Be Determined  Care plan was discussed with patient and her daughter.   Thank you for allowing the Palliative Medicine Team to assist in the care of this patient.  GenMicheline RoughD  Please  contact Palliative Medicine Team phone at [phone] questions and concerns.

## 2022-01-16 NOTE — Progress Notes (Signed)
Daily Progress Note   Patient Name: Leslie Mullen       Date: 01/16/2022 DOB: [birth year]  Age: 61 y.o. MRN#: [id number] Attending Physician: Flora Lipps, MD Primary Care Physician: Elinor Parkinson Admit Date: 01/07/2022  Reason for Consultation/Follow-up: Establishing goals of care, Hospice Evaluation, Non pain symptom management, and Pain control  Subjective: I saw and examined Leslie Mullen this afternoon.  Her son in law was present at the bedside.  We discussed goal of getting out of the hospital with hospice support.  Discussed medical needs getting off of IV pain medications to regimen that can be managed at home and also working to have tube feeds started.  Discussed again that tube feeds may be limiting factor in hospice options, but with understanding that her goal is to continue them for a few weeks to allow her to get her affairs in order, this may still line up with hospice care.  Discussed with TOC and hospice liaison.  Hospice liaison is planning to come and meet with family tomorrow afternoon to discuss home hospice and services offered.  I offered to be present as well to answer questions and assist in any need while she is hospitalized.  I then called and discussed again regarding plan for pain management and tube feeds with daughter and confirmed plan for family meeting tomorrow  Length of Stay: 9  Current Medications: Scheduled Meds:   sodium chloride   Intravenous Once   apixaban  5 mg Per Tube BID   calcium carbonate  1 tablet Oral BID WC   chlorhexidine  15 mL Mouth Rinse BID   fentaNYL  1 patch Transdermal Q72H   insulin aspart  0-20 Units Subcutaneous TID WC   insulin aspart  0-5 Units Subcutaneous QHS   mouth rinse  15 mL Mouth Rinse q12n4p    morphine  injection  2 mg Intravenous Once   pantoprazole (PROTONIX) IV  40 mg Intravenous Q12H    Continuous Infusions:  sodium chloride 100 mL/hr at 01/16/22 0112   sodium chloride Stopped (01/08/22 1220)   promethazine (PHENERGAN) injection (IM or IVPB)      PRN Meds: sodium chloride, docusate sodium, HYDROmorphone (DILAUDID) injection, HYDROmorphone, metoprolol tartrate, prochlorperazine, promethazine (PHENERGAN) injection (IM or IVPB)  Physical Exam         Patient  appears deconditioned  Regular work of breathing Awake and alert Has edema   Vital Signs: BP 134/64 (BP Location: Left Wrist)    Pulse (!) 104    Temp 98.1 F (36.7 C) (Oral)    Resp 18    Ht 5\' 8"  (1.727 m)    Wt 136 kg    SpO2 97%    BMI 45.59 kg/m  SpO2: SpO2: 97 % O2 Device: O2 Device: Nasal Cannula O2 Flow Rate: O2 Flow Rate (L/min): 3 L/min  Intake/output summary:  Intake/Output Summary (Last 24 hours) at 01/16/2022 0916 Last data filed at 01/16/2022 0413 Gross per 24 hour  Intake 120 ml  Output 400 ml  Net -280 ml    LBM: Last BM Date: 01/15/22 Baseline Weight: Weight: (!) 137.8 kg Most recent weight: Weight: 136 kg       Palliative Assessment/Data:      Patient Active Problem List   Diagnosis Date Noted   Hypophosphatemia 01/15/2022   Hypomagnesemia 01/15/2022   Palliative care by specialist    Goals of care, counseling/discussion    General weakness    Gastric outlet obstruction 01/07/2022   Sepsis ([place]) 01/02/2022   Dizziness 01/01/2022   SIRS (systemic inflammatory response syndrome) (HCC) 01/01/2022   Lactic acidosis 01/01/2022   Acute GI bleeding 12/18/2021   Symptomatic anemia 12/16/2021   Stage 3a chronic kidney disease (CKD) ([place]) 12/16/2021   Obesity, Class III, BMI 40-49.9 (morbid obesity) ([place]) 12/16/2021   Hyperlipidemia 12/16/2021   Anxiety 12/16/2021   Chronic pain 12/16/2021   Complete heart block ([place]) 12/16/2021   Port-A-Cath in place 11/18/2021   Family history of colon  cancer 10/20/2021   Pancreatic cancer metastasized to liver ([place]) 10/14/2021   Pancreatic mass    Pancreatitis 10/05/2021   Leukocytosis 10/05/2021   AKI (acute kidney injury) ([place]) 10/05/2021   Chronic anticoagulation 10/05/2021   Thrombocytosis 10/05/2021   Paroxysmal atrial fibrillation ([place]) 09/18/2021   Secondary hypercoagulable state ([place]) 09/18/2021   Endometrial cancer ([place]) 07/29/2021   Heart block 10/10/2020   Fall at home, initial encounter 10/10/2020   Type 2 diabetes mellitus with diabetic polyneuropathy, with long-term current use of insulin ([place]) 01/31/2020   Type 2 diabetes mellitus with hypoglycemia ([place]) 01/31/2020   Type 2 diabetes mellitus with stage 3a chronic kidney disease, with long-term current use of insulin ([place]) 01/31/2020   Abnormal feces    Benign neoplasm of ascending colon    Benign neoplasm of cecum    Benign neoplasm of transverse colon     Palliative Care Assessment & Plan   Patient Profile:    Assessment: Palliative performance scale 40%. 61 year old lady with life limiting illness of metastatic pancreatic cancer, left lower lobe pneumonia, metastatic disease burden to liver, admitted with gastric outlet obstruction secondary to pancreatic head lesion, status post stenting.  GI colleagues following closely and patient's diet was gradually being advanced.  Recommendations/Plan: Full code/full scope- Plan to discuss further tomorrow at family meeting S/p G/J tube. She reports needing to settle her affairs and understands that this is the most likely way to extend her life.  She also understands this will be limited, likely a matter of weeks and limited number of months, even with more aggressive care including PEG/J-tube placement. We discussed hospice services again today.  Plan is for meeting tomorrow in conjunction with hospice to clarify goals and plan. Pain, cancer related: She has had 12mg  of IV dilaudid in the last 24 hours for an oral  morphine  equivalent of 240mg  of oral morphine.  Plan to start fentanyl patch at 166mcg/hr for baseline pain with trial of dilaudid 6mg  per tube every 3 hours as needed for breakthrough pain.  Will also leave additional dilaudid 1mg  IV as second line medication to be given 45 minutes after per tube med if it is ineffective to relieve pain.     Code Status:    Code Status Orders  (From admission, onward)           Start     Ordered   01/07/22 1200  Full code  Continuous        01/07/22 1159           Code Status History     Date Active Date Inactive Code Status Order ID Comments User Context   01/01/2022 1149 01/05/2022 2054 Full Code [id number]  Norval Morton, MD ED   12/19/2021 1027 12/20/2021 1944 Full Code [id number]  Little Ishikawa, MD Inpatient   12/16/2021 1819 12/19/2021 1027 DNR [id number]  Mariel Aloe, MD Inpatient   10/05/2021 1053 10/09/2021 2244 Full Code [id number]  Norval Morton, MD ED   07/29/2021 0518 07/29/2021 1422 Full Code [id number]  Dorothyann Gibbs, NP Inpatient   10/10/2020 1620 10/12/2020 1902 Full Code [id number]  Shirley Friar, PA-C ED   10/10/2020 1009 10/10/2020 1128 Full Code [id number]  Dorothyann Gibbs, NP Inpatient       Prognosis:  Guarded   Discharge Planning: To Be Determined  Care plan was discussed with patient and her daughter.   Thank you for allowing the Palliative Medicine Team to assist in the care of this patient.  Micheline Rough, MD  Please contact Palliative Medicine Team phone at [phone] for questions and concerns.

## 2022-01-16 NOTE — TOC Progression Note (Addendum)
Transition of Care [facility]) - Progression Note    Patient Details  Name: Leslie Mullen MRN: [id number] Date of Birth: Aug 26, 1961  Transition of [facility]) CM/SW Contact  Muriel Wilber, Juliann Pulse, RN Phone Number: 01/16/2022, 10:42 AM  Clinical Narrative:   Noted for palliative care meeting today;If SNF is recc by PT I can pursue.If MD would agree to order PT cons maybe after meeting if family wants to pursue-but I'm not sure of bed offers but will try with all available, & realistic options. Reminder LTC is not an option here @ the hospital.  -noted DNR in place;TF continuous via GJT-bolus not an option.[facility] Cristela Blue will follow over weekend-see all prior notes.    Expected Discharge Plan: Home w Hospice Care Barriers to Discharge: Continued Medical Work up  Expected Discharge Plan and Services Expected Discharge Plan: [place] Acute Care Choice: NA Living arrangements for the past 2 months: Single Family Home                                       Social Determinants of Health (SDOH) Interventions    Readmission Risk Interventions Readmission Risk Prevention Plan 01/08/2022  Transportation Screening Complete  Medication Review Press photographer) Complete  PCP or Specialist appointment within 3-5 days of discharge Complete  HRI or [place] Complete  SW Recovery Care/Counseling Consult Complete  [place] Not Applicable  Some recent data might be hidden

## 2022-01-16 NOTE — Progress Notes (Addendum)
Nutrition Follow-up  DOCUMENTATION CODES:   Morbid obesity  INTERVENTION:  - will order [place] 1.4 @ 25 ml/hr via J-port and advance by 10 ml every 24 hours to reach goal rate of 65 ml/hr with 100 ml free water every 3 hours.  - at goal rate, this regimen will provide 2184 kcal, 96 grams protein, and 1923 ml free water. - bolus TF is not an option for patient with J-port feeding.  - re-weigh patient today.  Monitor magnesium, potassium, and phosphorus BID for at least 3 days, MD to replete as needed, as pt is at risk for refeeding syndrome given inadequate nutrition prior to PEG-J placement.   NUTRITION DIAGNOSIS:   Increased nutrient needs related to cancer and cancer related treatments as evidenced by estimated needs. -ongoing  GOAL:   Patient will meet greater than or equal to 90% of their needs -to be met with TF at goal rate  MONITOR:   TF tolerance, PO intake, Labs, Weight trends  REASON FOR ASSESSMENT:   Consult Enteral/tube feeding initiation and management  ASSESSMENT:   Pt is 61 y.o. female with medical history significant of metastatic pancreatic cancer, HTN, HLD, neuropathy, endometrial cancer, CHB s/p PPM who presented to the ED with nausea/vomiting and hyperglycemia and admitted for gastric outlet obstruction. Pt was recently hospitalized from 1/26-1/30 for sepsis secondary to port infection.  Patient sleeping at the time of RD visit and did not awake to name call. No visitors present at the time of RD visit.  On 2/8 she had PEG-J placed by IR. G-port is for venting purposes and J-port for feeding.  She has not been weighed since 2/2. Non-pitting edema to BUE and mild pitting edema to BLE documented in the edema section of flow sheet.   She is noted to be +12.6 L since admission.  She has been NPO or on CLD or FLD since admission. Current diet order is for FLD. The last documented meal completion percentage was 25% of breakfast on 2/7 (FLD).  Palliative  Care is following. Patient remains Full Code at this time. Note from yesterday read. They plan to follow-up with patient again today.   Dr. Burr Medico saw patient yesterday and note indicates d/c plan for SNF with hospice vs home with hospice.    Labs reviewed; CBGs: 139 and 136 mg/dl; BMP last done 2/8--Ca: 7.8 mg/dl, Phos: 1.9 mg/dl, Mg: 1.6 mg/dl.  Medications reviewed; 1 tablet oscal BID, sliding scale novolog, 40 mg IV protonix BID.  IVF; NS @ 100 ml/hr.   Diet Order:   Diet Order             Diet full liquid Room service appropriate? Yes; Fluid consistency: Thin  Diet effective now                   EDUCATION NEEDS:   Not appropriate for education at this time  Skin:  Skin Assessment: Skin Integrity Issues: Skin Integrity Issues:: Other (Comment) Other: ecchymosis  Last BM:  2/9 (type 3 x1)  Height:   Ht Readings from Last 1 Encounters:  01/08/22 _0  (1.727 m)    Weight:   Wt Readings from Last 1 Encounters:  01/08/22 136 kg     BMI:  Body mass index is 45.59 kg/m.   Estimated Nutritional Needs:  Kcal:  2000 - 2200 Protein:  100 - 115 grams Fluid:  >/= 2 L     Jarome Matin, MS, RD, LDN Inpatient Clinical Dietitian RD pager #  available in [facility]  After hours/weekend pager # available in [facility]

## 2022-01-16 NOTE — Progress Notes (Signed)
Bed weight was off from the last documented, encouraged patient to stand to use the standing scale patient stated she wants to wait until later when her pain is better.

## 2022-01-16 NOTE — Progress Notes (Signed)
PROGRESS NOTE    Leslie Mullen  JOI:[id number] DOB: [birth year] DOA: 01/07/2022 PCP: Sue Lush, PA-C    Brief Narrative:  Patient is a 61 years old female with PMH significant for CHB, s/p PPM, DM 2, hypertension, metastatic pancreatic cancer, endometrial cancer, morbid obesity presented in the ED with complaints of nausea and vomiting.  She was recently hospitalized from 1/26 - 1/30 for sepsis secondary to port infection.  At that time she was sent home with 5 days of oral doxycycline.  Blood cultures were negative.  Patient has acutely developed nausea and vomiting,  She was unable to take anything down,  she has tried Compazine and antacids did not help.  At this time, patient was admitted for sepsis secondary to pancreatic infection versus left lower lobe pneumonia. She is also found to have gastric outlet obstruction, general surgery, gastroenterology, oncology, palliative care consulted.  She underwent EGD with stent, nausea has improved.  Now she has recurrence of nausea and abdominal pain.  Dr. Burr Medico had long discussion with the patient, and underwent G- J-tube placement for venting of the stomach and post duodenal stent feeding by IR on 01/14/22.  Dietary has been consulted for tube feeding recommendation.  Patient and family has agreed with hospice.  Assessments underway.     Assessment and Plan: * Gastric outlet obstruction- (present on admission) Secondary to metastatic cancer.  Status post gastric jejunostomy tube with stent tube feeding.  Dietary has been consulted for tube feeding recommendations.  Sepsis ([place])- (present on admission) Thought to be secondary to pancreatic infection versus left lower lobe pneumonia.  Has completed cefepime and Flagyl for 5 days.  Procalcitonin 0.67>0.26.  Blood cultures no growth to date. Sepsis physiology resolved.   Pancreatic cancer metastasized to liver [facility])- (present on admission) Patient with metastatic pancreatic cancer followed by Dr. Burr Medico  as outpatient for palliative chemotherapy.  Presenting with gastric outlet obstruction secondary to pancreatic head lesion.  Was on NG tube during hospitalization for decompression.  Patient does have poor prognosis and poor tolerance to chemotherapy and is not a candidate for further cytotoxic treatment.  GI was consulted at this time but ultimately underwent gastrojejunostomy tube placement by IR on 01/24/2022.  Plan for hospice at this time.  Palliative care on board.  Hypomagnesemia Mild.  We will continue to replenish orally..  Hypophosphatemia Much improved today.  Phosphorus 2.3.   General weakness Debility and deconditioning due to advanced cancer.  Continue supportive care.  Goals of care, counseling/discussion Palliative care on board.  Hospice discussion underway  AKI (acute kidney injury) ([place])- (present on admission) Resolved at this time.  Thought to be secondary to volume depletion.  CT scan of the abdomen without any hydronephrosis.  Paroxysmal atrial fibrillation ([place])- (present on admission) Heart rate is controlled.  Eliquis has been restarted.  Type 2 diabetes mellitus with diabetic polyneuropathy, with long-term current use of insulin (HCC) Continue sliding scale insulin Accu-Cheks.  Closely monitor.    DVT prophylaxis:   Heparin drip apixaban (ELIQUIS) tablet 5 mg   Code Status:     Code Status: DNR  Disposition: Possible hospice  Status is: Inpatient Remains inpatient appropriate because: Status post GJ tube, waiting for hospice    Family Communication: Spoke with the patient's husband and son in law at bedside on 01/15/22.  Consultants:  Oncology General surgery Gastroenterology Palliative care Interventional radiology  Procedures:  Gastrojejunostomy tube placement by interventional radiology  Antimicrobials:  None  Subjective: Today, patient was seen and  examined at bedside.  Patient denies any nausea vomiting fever chills or rigors has mild  abdominal pain at the baseline.    Objective: Vitals:   01/15/22 1215 01/15/22 1955 01/16/22 0412 01/16/22 1412  BP: (!) 163/88 129/64 134/64 (!) 143/75  Pulse: 97 91 (!) 104 (!) 114  Resp: 18 16 18 18   Temp: 98 F (36.7 C) 98.3 F (36.8 C) 98.1 F (36.7 C) 99 F (37.2 C)  TempSrc: Oral  Oral Oral  SpO2: 100% 100% 97% 99%  Weight:      Height:        Intake/Output Summary (Last 24 hours) at 01/16/2022 1536 Last data filed at 01/16/2022 0413 Gross per 24 hour  Intake 120 ml  Output 400 ml  Net -280 ml   Filed Weights   01/07/22 0500 01/07/22 0502 01/08/22 1229  Weight: (!) 137.8 kg 136.1 kg 136 kg    Physical Examination: General: Obese built, deconditioned, not in obvious distress, closing her eyes HENT:   No scleral pallor or icterus noted. Oral mucosa is moist.  Chest:  Clear breath sounds.  Diminished breath sounds bilaterally. No crackles or wheezes.  CVS: S1 &S2 heard. No murmur.  Regular rate and rhythm. Abdomen: Soft, nontender, nondistended.  Bowel sounds are heard.   Extremities: No cyanosis, clubbing with bilateral pedal edema, peripheral pulses are palpable. Psych: Alert, awake and communicative, closes eyes after conversation, low mood. CNS:  No cranial nerve deficits.  Power equal in all extremities.   Skin: Warm and dry.  No rashes noted.   Data Reviewed:   CBC: Recent Labs  Lab 01/12/22 0442 01/13/22 0455 01/14/22 0447 01/15/22 0423 01/16/22 0452  WBC 14.0* 13.4* 14.4* 10.8* 9.7  NEUTROABS  --   --  10.7*  --   --   HGB 8.5* 7.4* 8.0* 8.0* 7.6*  HCT 28.0* 24.7* 25.5* 26.4* 24.4*  MCV 94.6 95.0 94.1 96.0 93.8  PLT 276 247 290 281 903    Basic Metabolic Panel: Recent Labs  Lab 01/10/22 0555 01/11/22 0019 01/12/22 0442 01/13/22 0455 01/14/22 0447 01/16/22 1148  NA 136 136 139 139 139  --   K 2.9* 3.1* 3.9 3.5 3.5  --   CL 95* 99 103 105 105  --   CO2 31 31 29 26 27   --   GLUCOSE 193* 171* 184* 210* 191*  --   BUN 19 17 14 11 9   --    CREATININE 1.16* 0.96 0.93 0.89 0.80  --   CALCIUM 6.8* 6.9* 7.6* 7.6* 7.8*  --   MG  --  1.7 1.7  --  1.6* 1.6*  PHOS  --  2.0* 2.8  --  1.9* 2.3*    Liver Function Tests: No results for input(s): AST, ALT, ALKPHOS, BILITOT, PROT, ALBUMIN in the last 168 hours.   Radiology Studies: No results found.    LOS: 9 days    Flora Lipps, MD [facility] 01/16/2022, 3:36 PM

## 2022-01-17 DIAGNOSIS — T402X5A Adverse effect of other opioids, initial encounter: Secondary | ICD-10-CM

## 2022-01-17 DIAGNOSIS — G893 Neoplasm related pain (acute) (chronic): Secondary | ICD-10-CM | POA: Diagnosis not present

## 2022-01-17 DIAGNOSIS — K311 Adult hypertrophic pyloric stenosis: Secondary | ICD-10-CM | POA: Diagnosis not present

## 2022-01-17 DIAGNOSIS — N179 Acute kidney failure, unspecified: Secondary | ICD-10-CM | POA: Diagnosis not present

## 2022-01-17 DIAGNOSIS — C259 Malignant neoplasm of pancreas, unspecified: Secondary | ICD-10-CM | POA: Diagnosis not present

## 2022-01-17 DIAGNOSIS — Z515 Encounter for palliative care: Secondary | ICD-10-CM | POA: Diagnosis not present

## 2022-01-17 DIAGNOSIS — K5903 Drug induced constipation: Secondary | ICD-10-CM

## 2022-01-17 DIAGNOSIS — Z7189 Other specified counseling: Secondary | ICD-10-CM | POA: Diagnosis not present

## 2022-01-17 DIAGNOSIS — R531 Weakness: Secondary | ICD-10-CM | POA: Diagnosis not present

## 2022-01-17 LAB — CBC
HCT: 27.1 % — ABNORMAL LOW (ref 36.0–46.0)
Hemoglobin: 8.1 g/dL — ABNORMAL LOW (ref 12.0–15.0)
MCH: 29.2 pg (ref 26.0–34.0)
MCHC: 29.9 g/dL — ABNORMAL LOW (ref 30.0–36.0)
MCV: 97.8 fL (ref 80.0–100.0)
Platelets: 337 10*3/uL (ref 150–400)
RBC: 2.77 MIL/uL — ABNORMAL LOW (ref 3.87–5.11)
RDW: 23.6 % — ABNORMAL HIGH (ref 11.5–15.5)
WBC: 9.9 10*3/uL (ref 4.0–10.5)
nRBC: 0 % (ref 0.0–0.2)

## 2022-01-17 LAB — BASIC METABOLIC PANEL
Anion gap: 6 (ref 5–15)
BUN: 10 mg/dL (ref 6–20)
CO2: 25 mmol/L (ref 22–32)
Calcium: 7.3 mg/dL — ABNORMAL LOW (ref 8.9–10.3)
Chloride: 107 mmol/L (ref 98–111)
Creatinine, Ser: 0.66 mg/dL (ref 0.44–1.00)
GFR, Estimated: >60 mL/min (ref >60)
Glucose, Bld: 212 mg/dL — ABNORMAL HIGH (ref 70–99)
Potassium: 3.8 mmol/L (ref 3.5–5.1)
Sodium: 138 mmol/L (ref 135–145)

## 2022-01-17 LAB — GLUCOSE, CAPILLARY
Glucose-Capillary: 157 mg/dL — ABNORMAL HIGH (ref 70–99)
Glucose-Capillary: 158 mg/dL — ABNORMAL HIGH (ref 70–99)
Glucose-Capillary: 172 mg/dL — ABNORMAL HIGH (ref 70–99)
Glucose-Capillary: 199 mg/dL — ABNORMAL HIGH (ref 70–99)
Glucose-Capillary: 201 mg/dL — ABNORMAL HIGH (ref 70–99)
Glucose-Capillary: 219 mg/dL — ABNORMAL HIGH (ref 70–99)

## 2022-01-17 LAB — PHOSPHORUS
Phosphorus: 2.8 mg/dL (ref 2.5–4.6)
Phosphorus: 2.7 mg/dL (ref 2.5–4.6)

## 2022-01-17 LAB — MAGNESIUM
Magnesium: 1.6 mg/dL — ABNORMAL LOW (ref 1.7–2.4)
Magnesium: 1.9 mg/dL (ref 1.7–2.4)

## 2022-01-17 MED ORDER — APIXABAN 5 MG PO TABS
5.0000 mg | ORAL_TABLET | Freq: Two times a day (BID) | ORAL | Status: DC
  Administered 2022-01-17 – 2022-01-26 (×19): 5 mg via ORAL
  Filled 2022-01-17 (×18): qty 1

## 2022-01-17 MED ORDER — MAGNESIUM SULFATE 2 GM/50ML IV SOLN
2.0000 g | Freq: Once | INTRAVENOUS | Status: AC
  Administered 2022-01-17: 2 g via INTRAVENOUS
  Filled 2022-01-17: qty 50

## 2022-01-17 MED ORDER — BISACODYL 10 MG RE SUPP
10.0000 mg | Freq: Every day | RECTAL | Status: DC | PRN
  Administered 2022-01-19: 10 mg via RECTAL
  Filled 2022-01-17: qty 1

## 2022-01-17 NOTE — Progress Notes (Addendum)
PROGRESS NOTE    Leslie Mullen  DXA:[id number] DOB: Feb 16, 1961 DOA: 01/07/2022 PCP: Sue Lush, PA-C    Brief Narrative:  Patient is a 61 years old female with PMH significant for CHB, s/p PPM, DM 2, hypertension, metastatic pancreatic cancer, endometrial cancer, morbid obesity presented in the ED with complaints of nausea and vomiting.  She was recently hospitalized from 1/26 - 1/30 for sepsis secondary to port infection.  At that time she was sent home with 5 days of oral doxycycline.  Blood cultures were negative.  Patient has acutely developed nausea and vomiting,  She was unable to take anything down,  she has tried Compazine and antacids did not help.  At this time, patient was admitted for sepsis secondary to pancreatic infection versus left lower lobe pneumonia. She is also found to have gastric outlet obstruction, general surgery, gastroenterology, oncology, palliative care consulted.  She underwent EGD with stent, nausea has improved.  Now she has recurrence of nausea and abdominal pain.  Dr. Burr Medico had long discussion with the patient, and underwent G- J-tube placement for venting of the stomach and post duodenal stent feeding by IR on 01/14/22.  Patient has been initiated on tube feedings at this time.  Patient and family has agreed with hospice.  Hospice set up pending at this time.     Assessment and Plan: * Gastric outlet obstruction- (present on admission) Secondary to metastatic cancer.  Status post gastric jejunostomy tube with stent tube feeding.  Patient has been initiated on tube feeding.  On full liquids as well.  Has been tolerating well.  Sepsis ([place])- (present on admission) Thought to be secondary to pancreatic infection versus left lower lobe pneumonia.  Has completed cefepime and Flagyl for 5 days.  Procalcitonin 0.67>0.26.  Blood cultures no growth to date. Sepsis physiology has resolved.   Pancreatic cancer metastasized to liver [facility])- (present on admission) Patient  with metastatic pancreatic cancer followed by Dr. Burr Medico as outpatient for palliative chemotherapy.  Presenting with gastric outlet obstruction secondary to pancreatic head lesion.  Was on NG tube during hospitalization for decompression.  Patient does have poor prognosis and poor tolerance to chemotherapy and is not a candidate for further cytotoxic treatment.  GI was consulted as well. At this time patient underwent gastrojejunostomy tube placement by IR on 01/24/2022.  Tube feedings have been initiated.  Plan for hospice.  Palliative care on board.  Paroxysmal atrial fibrillation ([place])- (present on admission) Heart rate is controlled.  On Eliquis.  As needed IV metoprolol.  Hypomagnesemia Mild.  We will continue to replenish orally received 2 g of magnesium sulfate yesterday.  Magnesium of 1.8 today.  Hypophosphatemia Replenished and improved.  Latest phosphorus of 2.8   General weakness Debility and deconditioning due to advanced cancer.  Continue supportive care.  Type 2 diabetes mellitus with diabetic polyneuropathy, with long-term current use of insulin (HCC) Continue sliding scale insulin Accu-Cheks.  Closely monitor.  Cancer related pain Palliative care on board.  On fentanyl patch and Dilaudid through the tube patient states that her pain is overall controlled  AKI (acute kidney injury) ([place])- (present on admission) Resolved at this time.  Thought to be secondary to volume depletion.  CT scan of the abdomen without any hydronephrosis.  Latest creatinine of 0.6  Goals of care, counseling/discussion Palliative care on board.  Hospice discussion and set up underway    DVT prophylaxis:    apixaban (ELIQUIS) tablet 5 mg   Code Status:  Code Status: DNR  Disposition: Possible hospice, set up pending  Status is: Inpatient  Remains inpatient appropriate because: Status post GJ tube, waiting for hospice   Family Communication:  Spoke with the patient's f at  bedside.  Consultants:  Oncology General surgery Gastroenterology Palliative care Interventional radiology  Procedures:  Gastrojejunostomy tube placement by interventional radiology on 01/13/2022.  Antimicrobials:  None  Subjective: Today, patient was seen and examined at bedside.  Patient is that she had no nausea vomiting has been tolerating tube feeding and overall feeling well.  Pain is under control  Objective: Vitals:   01/17/22 1318 01/17/22 2238 01/18/22 0423 01/18/22 0500  BP: 120/65 129/70 139/72   Pulse: 86 84 84   Resp: 19 19 14    Temp: 98.2 F (36.8 C) 97.9 F (36.6 C) 98 F (36.7 C)   TempSrc: Oral Oral Oral   SpO2: 100% 100% 100%   Weight:    (!) 150.1 kg  Height:        Intake/Output Summary (Last 24 hours) at 01/18/2022 1025 Last data filed at 01/18/2022 3664 Gross per 24 hour  Intake 4021.24 ml  Output 500 ml  Net 3521.24 ml   Filed Weights   01/08/22 1229 01/17/22 0500 01/18/22 0500  Weight: 136 kg (!) 150.4 kg (!) 150.1 kg    Physical Examination: General: Obese built, not in obvious distress HENT:   No scleral pallor or icterus noted. Oral mucosa is moist.  Chest:  Clear breath sounds.  Diminished breath sounds bilaterally. No crackles or wheezes.  CVS: S1 &S2 heard. No murmur.  Regular rate and rhythm. Abdomen: Soft, nonspecific tenderness on palpation, nondistended.  Bowel sounds are heard.   Extremities: No cyanosis, clubbing with bilateral pedal edema..  Peripheral pulses are palpable. Psych: Alert, awake and oriented, slightly low mood CNS:  No cranial nerve deficits.  Power equal in all extremities.   Skin: Warm and dry.  No rashes noted.  Data Reviewed:   CBC: Recent Labs  Lab 01/14/22 0447 01/15/22 0423 01/16/22 0452 01/17/22 0444 01/18/22 0445  WBC 14.4* 10.8* 9.7 9.9 9.9  NEUTROABS 10.7*  --   --   --   --   HGB 8.0* 8.0* 7.6* 8.1* 7.5*  HCT 25.5* 26.4* 24.4* 27.1* 24.3*  MCV 94.1 96.0 93.8 97.8 95.3  PLT 290 281 302  337 403    Basic Metabolic Panel: Recent Labs  Lab 01/12/22 0442 01/13/22 0455 01/14/22 0447 01/16/22 1148 01/16/22 1712 01/17/22 0444 01/17/22 1654 01/18/22 0445  NA 139 139 139  --   --  138  --   --   K 3.9 3.5 3.5  --   --  3.8  --   --   CL 103 105 105  --   --  107  --   --   CO2 29 26 27   --   --  25  --   --   GLUCOSE 184* 210* 191*  --   --  212*  --   --   BUN 14 11 9   --   --  10  --   --   CREATININE 0.93 0.89 0.80  --   --  0.66  --   --   CALCIUM 7.6* 7.6* 7.8*  --   --  7.3*  --   --   MG 1.7  --  1.6* 1.6* 1.6* 1.6* 1.9 1.8  PHOS 2.8  --  1.9* 2.3* 2.1* 2.8 2.7 2.8  Liver Function Tests: No results for input(s): AST, ALT, ALKPHOS, BILITOT, PROT, ALBUMIN in the last 168 hours.   Radiology Studies: No results found.    LOS: 11 days    Flora Lipps, MD [facility] 01/18/2022, 10:25 AM

## 2022-01-17 NOTE — Assessment & Plan Note (Addendum)
Palliative care on board.  On fentanyl patch and Dilaudid through the tube.  Patient will be on hospice care on discharge.

## 2022-01-18 DIAGNOSIS — C259 Malignant neoplasm of pancreas, unspecified: Secondary | ICD-10-CM | POA: Diagnosis not present

## 2022-01-18 DIAGNOSIS — R531 Weakness: Secondary | ICD-10-CM | POA: Diagnosis not present

## 2022-01-18 DIAGNOSIS — K311 Adult hypertrophic pyloric stenosis: Secondary | ICD-10-CM | POA: Diagnosis not present

## 2022-01-18 DIAGNOSIS — Z515 Encounter for palliative care: Secondary | ICD-10-CM | POA: Diagnosis not present

## 2022-01-18 DIAGNOSIS — N179 Acute kidney failure, unspecified: Secondary | ICD-10-CM | POA: Diagnosis not present

## 2022-01-18 DIAGNOSIS — G893 Neoplasm related pain (acute) (chronic): Secondary | ICD-10-CM | POA: Diagnosis not present

## 2022-01-18 LAB — GLUCOSE, CAPILLARY
Glucose-Capillary: 186 mg/dL — ABNORMAL HIGH (ref 70–99)
Glucose-Capillary: 198 mg/dL — ABNORMAL HIGH (ref 70–99)
Glucose-Capillary: 219 mg/dL — ABNORMAL HIGH (ref 70–99)
Glucose-Capillary: 243 mg/dL — ABNORMAL HIGH (ref 70–99)
Glucose-Capillary: 247 mg/dL — ABNORMAL HIGH (ref 70–99)
Glucose-Capillary: 258 mg/dL — ABNORMAL HIGH (ref 70–99)

## 2022-01-18 LAB — CBC
HCT: 24.3 % — ABNORMAL LOW (ref 36.0–46.0)
Hemoglobin: 7.5 g/dL — ABNORMAL LOW (ref 12.0–15.0)
MCH: 29.4 pg (ref 26.0–34.0)
MCHC: 30.9 g/dL (ref 30.0–36.0)
MCV: 95.3 fL (ref 80.0–100.0)
Platelets: 359 10*3/uL (ref 150–400)
RBC: 2.55 MIL/uL — ABNORMAL LOW (ref 3.87–5.11)
RDW: 23.8 % — ABNORMAL HIGH (ref 11.5–15.5)
WBC: 9.9 10*3/uL (ref 4.0–10.5)
nRBC: 0 % (ref 0.0–0.2)

## 2022-01-18 LAB — PHOSPHORUS
Phosphorus: 2.8 mg/dL (ref 2.5–4.6)
Phosphorus: 2.4 mg/dL — ABNORMAL LOW (ref 2.5–4.6)

## 2022-01-18 LAB — MAGNESIUM
Magnesium: 1.8 mg/dL (ref 1.7–2.4)
Magnesium: 1.8 mg/dL (ref 1.7–2.4)

## 2022-01-18 MED ORDER — FENTANYL 75 MCG/HR TD PT72
1.0000 | MEDICATED_PATCH | TRANSDERMAL | Status: DC
  Administered 2022-01-18 – 2022-01-24 (×3): 1 via TRANSDERMAL
  Filled 2022-01-18 (×3): qty 1

## 2022-01-18 NOTE — Progress Notes (Signed)
PROGRESS NOTE    Leslie Mullen  GXQ:[id number] DOB: 23-Mar-1961 DOA: 01/07/2022 PCP: Sue Lush, PA-C    Brief Narrative:  Patient is a 61 years old female with PMH significant for CHB, s/p PPM, DM 2, hypertension, metastatic pancreatic cancer, endometrial cancer, morbid obesity presented in the ED with complaints of nausea and vomiting.  She was recently hospitalized from 1/26 - 1/30 for sepsis secondary to port infection.  At that time she was sent home with 5 days of oral doxycycline.  Blood cultures were negative.  Patient has acutely developed nausea and vomiting,  She was unable to take anything down,  she has tried Compazine and antacids did not help.  At this time, patient was admitted for sepsis secondary to pancreatic infection versus left lower lobe pneumonia. She is also found to have gastric outlet obstruction, general surgery, gastroenterology, oncology, palliative care consulted.  She underwent EGD with stent, nausea has improved.  Now she has recurrence of nausea and abdominal pain.  Dr. Burr Medico had long discussion with the patient, and underwent G- J-tube placement for venting of the stomach and post duodenal stent feeding by IR on 01/14/22.  Patient has been initiated on tube feedings at this time.  Patient and family has agreed with hospice.  Hospice set up pending at this time.     Assessment and Plan: * Gastric outlet obstruction- (present on admission) Secondary to metastatic cancer.  Status post gastric jejunostomy tube with stent tube feeding.  Patient has been initiated on tube feeding.  On full liquids as well.  Has been tolerating well.  Sepsis ([place])- (present on admission) Thought to be secondary to pancreatic infection versus left lower lobe pneumonia.  Has completed cefepime and Flagyl for 5 days.  Procalcitonin 0.67>0.26.  Blood cultures no growth to date. Sepsis physiology has resolved.   Pancreatic cancer metastasized to liver [facility])- (present on admission) Patient  with metastatic pancreatic cancer followed by Dr. Burr Medico as outpatient for palliative chemotherapy.  Presenting with gastric outlet obstruction secondary to pancreatic head lesion.  Was on NG tube during hospitalization for decompression.  Patient does have poor prognosis and poor tolerance to chemotherapy and is not a candidate for further cytotoxic treatment.  GI was consulted as well. At this time patient underwent gastrojejunostomy tube placement by IR on 01/24/2022.  Tube feedings have been initiated.  Plan for hospice.  Palliative care on board.  Paroxysmal atrial fibrillation ([place])- (present on admission) Heart rate is controlled.  On Eliquis.  As needed IV metoprolol.  Hypomagnesemia Mild.  We will continue to replenish orally received 2 g of magnesium sulfate yesterday.  Magnesium of 1.8 today.  Hypophosphatemia Replenished and improved.  Latest phosphorus of 2.8   General weakness Debility and deconditioning due to advanced cancer.  Continue supportive care.  Type 2 diabetes mellitus with diabetic polyneuropathy, with long-term current use of insulin (HCC) Continue sliding scale insulin Accu-Cheks.  Closely monitor.  Cancer related pain Palliative care on board.  On fentanyl patch and Dilaudid through the tube patient states that her pain is overall controlled  AKI (acute kidney injury) ([place])- (present on admission) Resolved at this time.  Thought to be secondary to volume depletion.  CT scan of the abdomen without any hydronephrosis.  Latest creatinine of 0.6  Goals of care, counseling/discussion Palliative care on board.  Hospice discussion and set up underway    DVT prophylaxis:    apixaban (ELIQUIS) tablet 5 mg   Code Status:  Code Status: DNR  Disposition: Possible hospice, set up pending  Status is: Inpatient  Remains inpatient appropriate because: Status post GJ tube, waiting for hospice   Family Communication:  Spoke with the patient's f at  bedside.  Consultants:  Oncology General surgery Gastroenterology Palliative care Interventional radiology  Procedures:  Gastrojejunostomy tube placement by interventional radiology on 01/13/2022.  Antimicrobials:  None  Subjective: Today, patient was seen and examined at bedside.  Patient is that she had no nausea vomiting has been tolerating tube feeding and overall feeling well.  Pain is under control  Objective: Vitals:   01/17/22 1318 01/17/22 2238 01/18/22 0423 01/18/22 0500  BP: 120/65 129/70 139/72   Pulse: 86 84 84   Resp: 19 19 14    Temp: 98.2 F (36.8 C) 97.9 F (36.6 C) 98 F (36.7 C)   TempSrc: Oral Oral Oral   SpO2: 100% 100% 100%   Weight:    (!) 150.1 kg  Height:        Intake/Output Summary (Last 24 hours) at 01/18/2022 1034 Last data filed at 01/18/2022 7793 Gross per 24 hour  Intake 4021.24 ml  Output 500 ml  Net 3521.24 ml    Filed Weights   01/08/22 1229 01/17/22 0500 01/18/22 0500  Weight: 136 kg (!) 150.4 kg (!) 150.1 kg    Physical Examination: General: Obese built, not in obvious distress HENT:   No scleral pallor or icterus noted. Oral mucosa is moist.  Chest:  Clear breath sounds.  Diminished breath sounds bilaterally. No crackles or wheezes.  CVS: S1 &S2 heard. No murmur.  Regular rate and rhythm. Abdomen: Soft, nonspecific tenderness on palpation, nondistended.  Bowel sounds are heard.   Extremities: No cyanosis, clubbing with bilateral pedal edema..  Peripheral pulses are palpable. Psych: Alert, awake and oriented, slightly low mood CNS:  No cranial nerve deficits.  Power equal in all extremities.   Skin: Warm and dry.  No rashes noted.  Data Reviewed:   CBC: Recent Labs  Lab 01/14/22 0447 01/15/22 0423 01/16/22 0452 01/17/22 0444 01/18/22 0445  WBC 14.4* 10.8* 9.7 9.9 9.9  NEUTROABS 10.7*  --   --   --   --   HGB 8.0* 8.0* 7.6* 8.1* 7.5*  HCT 25.5* 26.4* 24.4* 27.1* 24.3*  MCV 94.1 96.0 93.8 97.8 95.3  PLT 290 281 302  337 359     Basic Metabolic Panel: Recent Labs  Lab 01/12/22 0442 01/13/22 0455 01/14/22 0447 01/16/22 1148 01/16/22 1712 01/17/22 0444 01/17/22 1654 01/18/22 0445  NA 139 139 139  --   --  138  --   --   K 3.9 3.5 3.5  --   --  3.8  --   --   CL 103 105 105  --   --  107  --   --   CO2 29 26 27   --   --  25  --   --   GLUCOSE 184* 210* 191*  --   --  212*  --   --   BUN 14 11 9   --   --  10  --   --   CREATININE 0.93 0.89 0.80  --   --  0.66  --   --   CALCIUM 7.6* 7.6* 7.8*  --   --  7.3*  --   --   MG 1.7  --  1.6* 1.6* 1.6* 1.6* 1.9 1.8  PHOS 2.8  --  1.9* 2.3* 2.1* 2.8 2.7  2.8     Liver Function Tests: No results for input(s): AST, ALT, ALKPHOS, BILITOT, PROT, ALBUMIN in the last 168 hours.   Radiology Studies: No results found.    LOS: 11 days    Flora Lipps, MD [facility] 01/18/2022, 10:34 AM

## 2022-01-18 NOTE — Plan of Care (Signed)

## 2022-01-18 NOTE — Progress Notes (Signed)
Daily Progress Note   Patient Name: Leslie Mullen       Date: 01/18/2022 DOB: 1961/05/30  Age: 61 y.o. MRN#: [id number] Attending Physician: Flora Lipps, MD Primary Care Physician: Elinor Parkinson Admit Date: 01/07/2022  Reason for Consultation/Follow-up: Establishing goals of care, Hospice Evaluation, Non pain symptom management, and Pain control  Subjective: I saw and examined [name] today.  She was lying in bed in no distress.  Sleeping but aroused easily.  Denied any complaints.  States she is tolerating tube feeds well.  Overall, feels pain is "fairly" well controlled.  We discussed plan to increase fentanyl patch.  She is also still not had a bowel movement.  Discussed trying suppository today.  Length of Stay: 11  Current Medications: Scheduled Meds:   sodium chloride   Intravenous Once   apixaban  5 mg Oral BID   calcium carbonate  1 tablet Oral BID WC   chlorhexidine  15 mL Mouth Rinse BID   fentaNYL  1 patch Transdermal Q72H   And   fentaNYL  1 patch Transdermal Q72H   free water  100 mL Per Tube Q3H   insulin aspart  0-20 Units Subcutaneous TID WC   insulin aspart  0-5 Units Subcutaneous QHS   magnesium oxide  400 mg Oral BID   mouth rinse  15 mL Mouth Rinse q12n4p    morphine injection  2 mg Intravenous Once   pantoprazole (PROTONIX) IV  40 mg Intravenous Q12H    Continuous Infusions:  sodium chloride 100 mL/hr at 01/18/22 1154   sodium chloride Stopped (01/08/22 1220)   feeding supplement (KATE FARMS STANDARD 1.4) 1,560 mL (01/17/22 1540)   promethazine (PHENERGAN) injection (IM or IVPB)      PRN Meds: sodium chloride, bisacodyl, docusate sodium, HYDROmorphone (DILAUDID) injection, HYDROmorphone, metoprolol tartrate, prochlorperazine,  promethazine (PHENERGAN) injection (IM or IVPB)  Physical Exam         Patient appears deconditioned  Regular work of breathing Sleeping but awakens easily Has edema   Vital Signs: BP 139/72 (BP Location: Right Arm)    Pulse 84    Temp 98 F (36.7 C) (Oral)    Resp 14    Ht 5\' 8"  (1.727 m)    Wt (!) 150.1 kg    SpO2 100%    BMI  50.32 kg/m  SpO2: SpO2: 100 % O2 Device: O2 Device: Nasal Cannula O2 Flow Rate: O2 Flow Rate (L/min): 1 L/min  Intake/output summary:  Intake/Output Summary (Last 24 hours) at 01/18/2022 1431 Last data filed at 01/18/2022 1154 Gross per 24 hour  Intake 4021.24 ml  Output 850 ml  Net 3171.24 ml    LBM: Last BM Date: 01/15/22 Baseline Weight: Weight: (!) 137.8 kg Most recent weight: Weight: (!) 150.1 kg       Palliative Assessment/Data:      Patient Active Problem List   Diagnosis Date Noted   Cancer related pain 01/17/2022   Hypophosphatemia 01/15/2022   Hypomagnesemia 01/15/2022   Palliative care by specialist    Goals of care, counseling/discussion    General weakness    Gastric outlet obstruction 01/07/2022   Sepsis ([place]) 01/02/2022   Dizziness 01/01/2022   SIRS (systemic inflammatory response syndrome) (HCC) 01/01/2022   Lactic acidosis 01/01/2022   Acute GI bleeding 12/18/2021   Symptomatic anemia 12/16/2021   Stage 3a chronic kidney disease (CKD) ([place]) 12/16/2021   Obesity, Class III, BMI 40-49.9 (morbid obesity) ([place]) 12/16/2021   Hyperlipidemia 12/16/2021   Anxiety 12/16/2021   Chronic pain 12/16/2021   Complete heart block ([place]) 12/16/2021   Port-A-Cath in place 11/18/2021   Family history of colon cancer 10/20/2021   Pancreatic cancer metastasized to liver ([place]) 10/14/2021   Pancreatic mass    Pancreatitis 10/05/2021   Leukocytosis 10/05/2021   AKI (acute kidney injury) ([place]) 10/05/2021   Chronic anticoagulation 10/05/2021   Thrombocytosis 10/05/2021   Paroxysmal atrial fibrillation ([place])  09/18/2021   Secondary hypercoagulable state ([place]) 09/18/2021   Endometrial cancer ([place]) 07/29/2021   Heart block 10/10/2020   Fall at home, initial encounter 10/10/2020   Type 2 diabetes mellitus with diabetic polyneuropathy, with long-term current use of insulin ([place]) 01/31/2020   Type 2 diabetes mellitus with hypoglycemia ([place]) 01/31/2020   Type 2 diabetes mellitus with stage 3a chronic kidney disease, with long-term current use of insulin ([place]) 01/31/2020   Abnormal feces    Benign neoplasm of ascending colon    Benign neoplasm of cecum    Benign neoplasm of transverse colon     Palliative Care Assessment & Plan   Patient Profile:    Assessment: Palliative performance scale 40%. 61 year old lady with life limiting illness of metastatic pancreatic cancer, left lower lobe pneumonia, metastatic disease burden to liver, admitted with gastric outlet obstruction secondary to pancreatic head lesion, status post stenting.  GI colleagues following closely and patient's diet was gradually being advanced.  Recommendations/Plan: DNR/DNI.   S/p G/J tube. She reports needing to settle her affairs and understands that this is the most likely way to extend her life.  She also understands this will be limited, likely a matter of weeks and limited number of months, even with more aggressive care including PEG/J-tube placement. She is interested in hospice services, but question if they can supply tube feeds as this would not be able to be done with bolus feeds but would require a pump as she has J-tube extension.  Continue to progress tube feeds to goal. Pain, cancer related: Reports fairly well-controlled on current regimen.  MAR reviewed and she has used 5 doses of 6 mg of Dilaudid in the last 24 hours for total oral morphine equivalent of roughly 120 mg of oral morphine.  Plan to increase fentanyl patch to 150 mcg/h with patch change later today.  We will continue with dilaudid 6mg  per  tube every 3 hours as needed for breakthrough pain.  While she has not been needing it, we will plan to leave additional dilaudid 1mg  IV as second line medication to be given 45 minutes after per tube med if it is ineffective to relieve pain. Constipation, opioid related: Plan for suppository.  We will also need to start bowel regimen, however, have been wanting to ensure she did not develop diarrhea from tube feeds first.     Code Status:    Code Status Orders  (From admission, onward)           Start     Ordered   01/07/22 1200  Full code  Continuous        01/07/22 1159           Code Status History     Date Active Date Inactive Code Status Order ID Comments User Context   01/01/2022 1149 01/05/2022 2054 Full Code [id number]  Norval Morton, MD ED   12/19/2021 1027 12/20/2021 1944 Full Code [id number]  Little Ishikawa, MD Inpatient   12/16/2021 1819 12/19/2021 1027 DNR [id number]  Mariel Aloe, MD Inpatient   10/05/2021 1053 10/09/2021 2244 Full Code [id number]  Norval Morton, MD ED   07/29/2021 0518 07/29/2021 1422 Full Code [id number]  Dorothyann Gibbs, NP Inpatient   10/10/2020 1620 10/12/2020 1902 Full Code [id number]  Shirley Friar, PA-C ED   10/10/2020 1009 10/10/2020 1128 Full Code [id number]  Dorothyann Gibbs, NP Inpatient       Prognosis:  Guarded   Discharge Planning: To Be Determined  Care plan was discussed with patient  Thank you for allowing the Palliative Medicine Team to assist in the care of this patient.  Micheline Rough, MD  Please contact Palliative Medicine Team phone at [phone] for questions and concerns.

## 2022-01-18 NOTE — Progress Notes (Signed)
Daily Progress Note   Patient Name: Leslie Mullen       Date: 01/18/2022 DOB: 01-Jun-1961  Age: 61 y.o. MRN#: [id number] Attending Physician: Flora Lipps, MD Primary Care Physician: Elinor Parkinson Admit Date: 01/07/2022  Reason for Consultation/Follow-up: Establishing goals of care, Hospice Evaluation, Non pain symptom management, and Pain control  Subjective: I saw and examined Leslie Mullen today.  Her son, Lavell Anchors, was also at the bedside.  She has been tolerating increasing of tube feeds.  Still reports she has not had a bowel movement for several days.  We discussed plan for suppository today.  We discussed pain regimen.  Currently, she reports that her pain is fairly well controlled but she is requiring around-the-clock pain medications.  Discussed plan to increase her fentanyl patch with next dose tomorrow.  She also requested letter stating she is in the hospital and able to perform some of her own errands such as paying bills.  I provided a copy of the letter for her to review today.  Length of Stay: 11  Current Medications: Scheduled Meds:   sodium chloride   Intravenous Once   apixaban  5 mg Oral BID   calcium carbonate  1 tablet Oral BID WC   chlorhexidine  15 mL Mouth Rinse BID   fentaNYL  1 patch Transdermal Q72H   free water  100 mL Per Tube Q3H   insulin aspart  0-20 Units Subcutaneous TID WC   insulin aspart  0-5 Units Subcutaneous QHS   magnesium oxide  400 mg Oral BID   mouth rinse  15 mL Mouth Rinse q12n4p    morphine injection  2 mg Intravenous Once   pantoprazole (PROTONIX) IV  40 mg Intravenous Q12H    Continuous Infusions:  sodium chloride 100 mL/hr at 01/18/22 0124   sodium chloride Stopped (01/08/22 1220)   feeding supplement (KATE  FARMS STANDARD 1.4) 1,560 mL (01/17/22 1540)   promethazine (PHENERGAN) injection (IM or IVPB)      PRN Meds: sodium chloride, bisacodyl, docusate sodium, HYDROmorphone (DILAUDID) injection, HYDROmorphone, metoprolol tartrate, prochlorperazine, promethazine (PHENERGAN) injection (IM or IVPB)  Physical Exam         Patient appears deconditioned  Regular work of breathing Awake and alert Has edema   Vital Signs: BP 139/72 (BP Location: Right Arm)  Pulse 84    Temp 98 F (36.7 C) (Oral)    Resp 14    Ht 5\' 8"  (1.727 m)    Wt (!) 150.1 kg    SpO2 100%    BMI 50.32 kg/m  SpO2: SpO2: 100 % O2 Device: O2 Device: Nasal Cannula O2 Flow Rate: O2 Flow Rate (L/min): 1 L/min  Intake/output summary:  Intake/Output Summary (Last 24 hours) at 01/18/2022 0831 Last data filed at 01/18/2022 9485 Gross per 24 hour  Intake 4141.24 ml  Output 500 ml  Net 3641.24 ml    LBM: Last BM Date: 01/15/22 Baseline Weight: Weight: (!) 137.8 kg Most recent weight: Weight: (!) 150.1 kg       Palliative Assessment/Data:      Patient Active Problem List   Diagnosis Date Noted   Cancer related pain 01/17/2022   Hypophosphatemia 01/15/2022   Hypomagnesemia 01/15/2022   Palliative care by specialist    Goals of care, counseling/discussion    General weakness    Gastric outlet obstruction 01/07/2022   Sepsis ([place]) 01/02/2022   Dizziness 01/01/2022   SIRS (systemic inflammatory response syndrome) (HCC) 01/01/2022   Lactic acidosis 01/01/2022   Acute GI bleeding 12/18/2021   Symptomatic anemia 12/16/2021   Stage 3a chronic kidney disease (CKD) ([place]) 12/16/2021   Obesity, Class III, BMI 40-49.9 (morbid obesity) ([place]) 12/16/2021   Hyperlipidemia 12/16/2021   Anxiety 12/16/2021   Chronic pain 12/16/2021   Complete heart block ([place]) 12/16/2021   Port-A-Cath in place 11/18/2021   Family history of colon cancer 10/20/2021   Pancreatic cancer metastasized to liver ([place]) 10/14/2021    Pancreatic mass    Pancreatitis 10/05/2021   Leukocytosis 10/05/2021   AKI (acute kidney injury) ([place]) 10/05/2021   Chronic anticoagulation 10/05/2021   Thrombocytosis 10/05/2021   Paroxysmal atrial fibrillation ([place]) 09/18/2021   Secondary hypercoagulable state ([place]) 09/18/2021   Endometrial cancer ([place]) 07/29/2021   Heart block 10/10/2020   Fall at home, initial encounter 10/10/2020   Type 2 diabetes mellitus with diabetic polyneuropathy, with long-term current use of insulin ([place]) 01/31/2020   Type 2 diabetes mellitus with hypoglycemia ([place]) 01/31/2020   Type 2 diabetes mellitus with stage 3a chronic kidney disease, with long-term current use of insulin ([place]) 01/31/2020   Abnormal feces    Benign neoplasm of ascending colon    Benign neoplasm of cecum    Benign neoplasm of transverse colon     Palliative Care Assessment & Plan   Patient Profile:    Assessment: Palliative performance scale 40%. 61 year old lady with life limiting illness of metastatic pancreatic cancer, left lower lobe pneumonia, metastatic disease burden to liver, admitted with gastric outlet obstruction secondary to pancreatic head lesion, status Mullen stenting.  GI colleagues following closely and patient's diet was gradually being advanced.  Recommendations/Plan: DNR/DNI.   S/p G/J tube. She reports needing to settle her affairs and understands that this is the most likely way to extend her life.  She also understands this will be limited, likely a matter of weeks and limited number of months, even with more aggressive care including PEG/J-tube placement. She is interested in hospice services, but question if they can supply tube feeds as this would not be able to be done with bolus feeds but would require a pump as she has J-tube extension.  Continue to progress tube feeds to goal. Pain, cancer related: Reports fairly well-controlled on current regimen.  We will plan to continue fentanyl  patch at 158mcg/hr for baseline pain  with trial of dilaudid 6mg  per tube every 3 hours as needed for breakthrough pain for now.  We will plan to increase fentanyl patch tomorrow.  Will also leave additional dilaudid 1mg  IV as second line medication to be given 45 minutes after per tube med if it is ineffective to relieve pain but she has not required this in the last 24 hours.     Code Status:    Code Status Orders  (From admission, onward)           Start     Ordered   01/07/22 1200  Full code  Continuous        01/07/22 1159           Code Status History     Date Active Date Inactive Code Status Order ID Comments User Context   01/01/2022 1149 01/05/2022 2054 Full Code [id number]  Norval Morton, MD ED   12/19/2021 1027 12/20/2021 1944 Full Code [id number]  Little Ishikawa, MD Inpatient   12/16/2021 1819 12/19/2021 1027 DNR [id number]  Mariel Aloe, MD Inpatient   10/05/2021 1053 10/09/2021 2244 Full Code [id number]  Norval Morton, MD ED   07/29/2021 0518 07/29/2021 1422 Full Code [id number]  Dorothyann Gibbs, NP Inpatient   10/10/2020 1620 10/12/2020 1902 Full Code [id number]  Shirley Friar, PA-C ED   10/10/2020 1009 10/10/2020 1128 Full Code [id number]  Dorothyann Gibbs, NP Inpatient       Prognosis:  Guarded   Discharge Planning: To Be Determined  Care plan was discussed with patient and her son  Thank you for allowing the Palliative Medicine Team to assist in the care of this patient.  Micheline Rough, MD  Please contact Palliative Medicine Team phone at [phone] for questions and concerns.

## 2022-01-19 DIAGNOSIS — C259 Malignant neoplasm of pancreas, unspecified: Secondary | ICD-10-CM | POA: Diagnosis not present

## 2022-01-19 DIAGNOSIS — Z515 Encounter for palliative care: Secondary | ICD-10-CM | POA: Diagnosis not present

## 2022-01-19 DIAGNOSIS — N179 Acute kidney failure, unspecified: Secondary | ICD-10-CM | POA: Diagnosis not present

## 2022-01-19 DIAGNOSIS — K311 Adult hypertrophic pyloric stenosis: Secondary | ICD-10-CM | POA: Diagnosis not present

## 2022-01-19 DIAGNOSIS — G893 Neoplasm related pain (acute) (chronic): Secondary | ICD-10-CM | POA: Diagnosis not present

## 2022-01-19 DIAGNOSIS — R531 Weakness: Secondary | ICD-10-CM | POA: Diagnosis not present

## 2022-01-19 LAB — GLUCOSE, CAPILLARY
Glucose-Capillary: 214 mg/dL — ABNORMAL HIGH (ref 70–99)
Glucose-Capillary: 226 mg/dL — ABNORMAL HIGH (ref 70–99)
Glucose-Capillary: 238 mg/dL — ABNORMAL HIGH (ref 70–99)
Glucose-Capillary: 262 mg/dL — ABNORMAL HIGH (ref 70–99)
Glucose-Capillary: 263 mg/dL — ABNORMAL HIGH (ref 70–99)
Glucose-Capillary: 278 mg/dL — ABNORMAL HIGH (ref 70–99)

## 2022-01-19 LAB — PHOSPHORUS
Phosphorus: 2.1 mg/dL — ABNORMAL LOW (ref 2.5–4.6)
Phosphorus: 1.5 mg/dL — ABNORMAL LOW (ref 2.5–4.6)

## 2022-01-19 LAB — MAGNESIUM
Magnesium: 1.8 mg/dL (ref 1.7–2.4)
Magnesium: 1.6 mg/dL — ABNORMAL LOW (ref 1.7–2.4)

## 2022-01-19 LAB — CBC
HCT: 24.9 % — ABNORMAL LOW (ref 36.0–46.0)
Hemoglobin: 7.7 g/dL — ABNORMAL LOW (ref 12.0–15.0)
MCH: 29.4 pg (ref 26.0–34.0)
MCHC: 30.9 g/dL (ref 30.0–36.0)
MCV: 95 fL (ref 80.0–100.0)
Platelets: 350 10*3/uL (ref 150–400)
RBC: 2.62 MIL/uL — ABNORMAL LOW (ref 3.87–5.11)
RDW: 23.6 % — ABNORMAL HIGH (ref 11.5–15.5)
WBC: 10.1 10*3/uL (ref 4.0–10.5)
nRBC: 0 % (ref 0.0–0.2)

## 2022-01-19 MED ORDER — FENTANYL 75 MCG/HR TD PT72: 1.0000 | MEDICATED_PATCH | TRANSDERMAL | Status: DC

## 2022-01-19 MED ORDER — POLYETHYLENE GLYCOL 3350 17 G PO PACK
17.0000 g | PACK | Freq: Two times a day (BID) | ORAL | Status: DC
  Administered 2022-01-19 – 2022-01-22 (×5): 17 g via ORAL
  Filled 2022-01-19 (×7): qty 1

## 2022-01-19 MED ORDER — SORBITOL 70 % SOLN
960.0000 mL | TOPICAL_OIL | Freq: Once | ORAL | Status: AC | PRN
  Administered 2022-01-19: 960 mL via RECTAL
  Filled 2022-01-19: qty 473

## 2022-01-19 MED ORDER — BISACODYL 10 MG RE SUPP: 10.0000 mg | Freq: Every day | RECTAL | 0 refills | Status: AC | PRN

## 2022-01-19 MED ORDER — KATE FARMS STANDARD 1.4 PO LIQD: 1560.0000 mL | ORAL | Status: DC

## 2022-01-19 MED ORDER — FREE WATER: 100.0000 mL | Status: AC

## 2022-01-19 MED ORDER — HYDROMORPHONE HCL 2 MG PO TABS
6.0000 mg | ORAL_TABLET | ORAL | Status: DC | PRN
  Administered 2022-01-19 – 2022-01-25 (×26): 8 mg
  Administered 2022-01-26 (×2): 6 mg
  Filled 2022-01-19 (×9): qty 4
  Filled 2022-01-19: qty 3
  Filled 2022-01-19 (×5): qty 4
  Filled 2022-01-19: qty 3
  Filled 2022-01-19 (×12): qty 4

## 2022-01-19 MED ORDER — K PHOS MONO-SOD PHOS DI & MONO 155-852-130 MG PO TABS: 250.0000 mg | ORAL_TABLET | Freq: Every day | ORAL | 0 refills | Status: AC

## 2022-01-19 MED ORDER — SORBITOL 70 % SOLN
960.0000 mL | TOPICAL_OIL | Freq: Once | ORAL | Status: DC
  Filled 2022-01-19: qty 473

## 2022-01-19 MED ORDER — K PHOS MONO-SOD PHOS DI & MONO 155-852-130 MG PO TABS
500.0000 mg | ORAL_TABLET | Freq: Three times a day (TID) | ORAL | Status: AC
  Administered 2022-01-19 (×3): 500 mg
  Filled 2022-01-19 (×3): qty 2

## 2022-01-19 MED ORDER — PANTOPRAZOLE SODIUM 40 MG PO TBEC: 40.0000 mg | DELAYED_RELEASE_TABLET | Freq: Two times a day (BID) | ORAL | 1 refills | Status: AC

## 2022-01-19 MED ORDER — FENTANYL 75 MCG/HR TD PT72: 1.0000 | MEDICATED_PATCH | TRANSDERMAL | 0 refills | Status: DC

## 2022-01-19 MED ORDER — HYDROMORPHONE HCL 2 MG PO TABS: 6.0000 mg | ORAL_TABLET | ORAL | 0 refills | Status: AC | PRN

## 2022-01-19 NOTE — Progress Notes (Signed)
Daily Progress Note   Patient Name: Leslie Mullen       Date: 01/19/2022 DOB: October 28, 1961  Age: 61 y.o. MRN#: [id number] Attending Physician: Flora Lipps, MD Primary Care Physician: Elinor Parkinson Admit Date: 01/07/2022  Reason for Consultation/Follow-up: Establishing goals of care, Hospice Evaluation, Non pain symptom management, and Pain control  Subjective: I saw and examined Ms. Leslie Mullen.  She was lying in bed in no distress.  Reports that she has been having some worsening pain in that 6 mg dose of enteral Dilaudid does not seem to be as effective.  She is requiring second medication of IV Dilaudid.  We discussed increasing dose to 8 mg of Dilaudid via tube every 3 hours as needed to see if this is more effective.  We also discussed strategies including pretreating prior to activities to cause her distress.  Additionally, we discussed that it may be better to achieve pain control at home as she can just take medications as needed rather than needing to wait for the nurse to retrieve from [place] which can be a delay as often nurses are tied up with other patients at the time initial request for pain medication is made.  She still not had a bowel movement.  Discussed need for either suppository or enema today.  Length of Stay: 12  Current Medications: Scheduled Meds:   sodium chloride   Intravenous Once   apixaban  5 mg Oral BID   calcium carbonate  1 tablet Oral BID WC   chlorhexidine  15 mL Mouth Rinse BID   fentaNYL  1 patch Transdermal Q72H   And   fentaNYL  1 patch Transdermal Q72H   free water  100 mL Per Tube Q3H   insulin aspart  0-20 Units Subcutaneous TID WC   insulin aspart  0-5 Units Subcutaneous QHS   magnesium oxide  400 mg Oral BID   mouth rinse  15 mL  Mouth Rinse q12n4p    morphine injection  2 mg Intravenous Once   pantoprazole (PROTONIX) IV  40 mg Intravenous Q12H   phosphorus  500 mg Per Tube TID    Continuous Infusions:  sodium chloride 100 mL/hr at 01/19/22 1847   sodium chloride Stopped (01/08/22 1220)   feeding supplement (KATE FARMS STANDARD 1.4) 1,560 mL (01/19/22 1554)   promethazine (  PHENERGAN) injection (IM or IVPB)      PRN Meds: sodium chloride, bisacodyl, docusate sodium, HYDROmorphone (DILAUDID) injection, HYDROmorphone, metoprolol tartrate, prochlorperazine, promethazine (PHENERGAN) injection (IM or IVPB)  Physical Exam         Patient appears deconditioned  Regular work of breathing Sleeping but awakens easily Has edema   Vital Signs: BP 122/68 (BP Location: Left Wrist)    Pulse 87    Temp 98.8 F (37.1 C) (Oral)    Resp 18    Ht 5\' 8"  (1.727 m)    Wt (!) 150.1 kg    SpO2 100%    BMI 50.32 kg/m  SpO2: SpO2: 100 % O2 Device: O2 Device: Nasal Cannula O2 Flow Rate: O2 Flow Rate (L/min): 1 L/min  Intake/output summary:  Intake/Output Summary (Last 24 hours) at 01/19/2022 1902 Last data filed at 01/19/2022 0700 Gross per 24 hour  Intake 2809.27 ml  Output 300 ml  Net 2509.27 ml    LBM: Last BM Date: 01/15/22 Baseline Weight: Weight: (!) 137.8 kg Most recent weight: Weight: (!) 150.1 kg       Palliative Assessment/Data:      Patient Active Problem List   Diagnosis Date Noted   Cancer related pain 01/17/2022   Hypophosphatemia 01/15/2022   Hypomagnesemia 01/15/2022   Palliative care by specialist    Goals of care, counseling/discussion    General weakness    Gastric outlet obstruction 01/07/2022   Sepsis ([place]) 01/02/2022   Dizziness 01/01/2022   SIRS (systemic inflammatory response syndrome) (HCC) 01/01/2022   Lactic acidosis 01/01/2022   Acute GI bleeding 12/18/2021   Symptomatic anemia 12/16/2021   Stage 3a chronic kidney disease (CKD) ([place]) 12/16/2021   Obesity,  Class III, BMI 40-49.9 (morbid obesity) ([place]) 12/16/2021   Hyperlipidemia 12/16/2021   Anxiety 12/16/2021   Chronic pain 12/16/2021   Complete heart block ([place]) 12/16/2021   Port-A-Cath in place 11/18/2021   Family history of colon cancer 10/20/2021   Pancreatic cancer metastasized to liver ([place]) 10/14/2021   Pancreatic mass    Pancreatitis 10/05/2021   Leukocytosis 10/05/2021   AKI (acute kidney injury) ([place]) 10/05/2021   Chronic anticoagulation 10/05/2021   Thrombocytosis 10/05/2021   Paroxysmal atrial fibrillation (HCC) 09/18/2021   Secondary hypercoagulable state ([place]) 09/18/2021   Endometrial cancer ([place]) 07/29/2021   Heart block 10/10/2020   Fall at home, initial encounter 10/10/2020   Type 2 diabetes mellitus with diabetic polyneuropathy, with long-term current use of insulin ([place]) 01/31/2020   Type 2 diabetes mellitus with hypoglycemia ([place]) 01/31/2020   Type 2 diabetes mellitus with stage 3a chronic kidney disease, with long-term current use of insulin ([place]) 01/31/2020   Abnormal feces    Benign neoplasm of ascending colon    Benign neoplasm of cecum    Benign neoplasm of transverse colon     Palliative Care Assessment & Plan   Patient Profile:    Assessment: Palliative performance scale 40%. 61 year old lady with life limiting illness of metastatic pancreatic cancer, left lower lobe pneumonia, metastatic disease burden to liver, admitted with gastric outlet obstruction secondary to pancreatic head lesion, status Mullen stenting.  GI colleagues following closely and patient's diet was gradually being advanced.  Recommendations/Plan: DNR/DNI.   S/p G/J tube. She reports needing to settle her affairs and understands that this is the most likely way to extend her life.  She also understands this will be limited, likely a matter of weeks and limited number of months, even with more aggressive care including PEG/J-tube  placement. Plan is to discharge  home with [facility] Pain, cancer related: Reports pain has been less well controlled over the past 24 hours and she has not been getting as much relief from 6 mg dose of enteral Dilaudid.  We discussed increasing to 6-8 mg per tube every 3 hours as needed for breakthrough pain.  Continue increased dose of fentanyl patch to 150 mcg/h.  Also has dilaudid 1mg  IV as second line medication to be given 45 minutes after per tube med if it is ineffective to relieve pain. Constipation, opioid related: We have been discussing suppository for the last couple of days but still does not happen.  She will either need to have suppository or enema today.     Code Status:    Code Status Orders  (From admission, onward)           Start     Ordered   01/07/22 1200  Full code  Continuous        01/07/22 1159           Code Status History     Date Active Date Inactive Code Status Order ID Comments User Context   01/01/2022 1149 01/05/2022 2054 Full Code [id number]  Norval Morton, MD ED   12/19/2021 1027 12/20/2021 1944 Full Code [id number]  Little Ishikawa, MD Inpatient   12/16/2021 1819 12/19/2021 1027 DNR [id number]  Mariel Aloe, MD Inpatient   10/05/2021 1053 10/09/2021 2244 Full Code [id number]  Norval Morton, MD ED   07/29/2021 0518 07/29/2021 1422 Full Code [id number]  Dorothyann Gibbs, NP Inpatient   10/10/2020 1620 10/12/2020 1902 Full Code [id number]  Shirley Friar, PA-C ED   10/10/2020 1009 10/10/2020 1128 Full Code [id number]  Dorothyann Gibbs, NP Inpatient       Prognosis: <6 months if her disease follows natural course and she she should qualify for home hospice services  Discharge Planning: Home with Hospice  Care plan was discussed with patient  Thank you for allowing the Palliative Medicine Team to assist in the care of this patient.  Micheline Rough, MD  Please contact Palliative Medicine Team phone at [phone] for questions and concerns.

## 2022-01-19 NOTE — TOC Progression Note (Addendum)
Transition of Care [facility]) - Progression Note    Patient Details  Name: Leslie Mullen MRN: [id number] Date of Birth: [birth year]  Transition of [facility]) CM/SW Contact  Mariavictoria Nottingham, Juliann Pulse, RN Phone Number: 01/19/2022, 9:30 AM  Clinical Narrative:   [facility] will be able to accept-they will confirm with dtr [name];they will need to order dme-they will manage.Jessica/RD/pharmacy to provide 3 days of formula to d/c with. [facility] rep Cheri-will manage all dme with [facility].Will arrange transport if needed-await to discuss w/dtr/patient. -1p patient states she needs to have a BM & d/c is tomorrow when she has made arrangements for family to be in the home.MD updated.    Expected Discharge Plan: Home w Hospice Care Barriers to Discharge: Continued Medical Work up  Expected Discharge Plan and Services Expected Discharge Plan: [place] Acute Care Choice: NA Living arrangements for the past 2 months: Single Family Home                                       Social Determinants of Health (SDOH) Interventions    Readmission Risk Interventions Readmission Risk Prevention Plan 01/08/2022  Transportation Screening Complete  Medication Review [employer]) Complete  PCP or Specialist appointment within 3-5 days of discharge Complete  HRI or [place] Complete  SW Recovery Care/Counseling Consult Complete  [place] Not Applicable  Some recent data might be hidden

## 2022-01-19 NOTE — Discharge Summary (Addendum)
Physician Discharge Summary   Patient: Leslie Mullen MRN: [id number] DOB: 1961-10-24  Admit date:     01/07/2022  Discharge date: 01/20/22  Discharge Physician: Corrie Mckusick Caidon Foti   PCP: Sue Lush, PA-C   Recommendations at discharge:   Follow-up plan as per hospice care provider.  Discharge Diagnoses: Principal Problem:   Gastric outlet obstruction Active Problems:   Sepsis ([place])   Pancreatic cancer metastasized to liver (HCC)   Paroxysmal atrial fibrillation (HCC)   Hypomagnesemia   General weakness   Hypophosphatemia   Type 2 diabetes mellitus with diabetic polyneuropathy, with long-term current use of insulin (HCC)   Cancer related pain   AKI (acute kidney injury) ([place])   Goals of care, counseling/discussion   Palliative care by specialist  Resolved Problems:   * No resolved hospital problems. [facility] Hospital Course: Patient is a 61 years old female with PMH significant for CHB, s/p PPM, DM 2, hypertension, metastatic pancreatic cancer, endometrial cancer, morbid obesity presented in the ED with complaints of nausea and vomiting.  She was recently hospitalized from 1/26 - 1/30 for sepsis secondary to port infection.  At that time she was sent home with 5 days of oral doxycycline.  Blood cultures were negative.  Patient has acutely developed nausea and vomiting,  She was unable to take anything down,  she has tried Compazine and antacids did not help.  At this time, patient was admitted for sepsis secondary to pancreatic infection versus left lower lobe pneumonia. She is also found to have gastric outlet obstruction, general surgery, gastroenterology, oncology, palliative care consulted.  She underwent EGD with stent, nausea has improved.  Now she has recurrence of nausea and abdominal pain.  Dr. Burr Medico had long discussion with the patient, and underwent G- J-tube placement for venting of the stomach and post duodenal stent feeding by IR on 01/14/22.  Patient has been initiated on tube  feedings at this time.  Patient has been considered for home hospice at this time with tube feedings.  Assessment and Plan: * Gastric outlet obstruction- (present on admission) Secondary to metastatic cancer.  Status post gastric jejunostomy tube with stent tube feeding.  Patient has been initiated on tube feeding.  On full liquids as well.  Has been tolerating well.  Sepsis ([place])- (present on admission) Thought to be secondary to pancreatic infection versus left lower lobe pneumonia.  Has completed cefepime and Flagyl for 5 days.  Procalcitonin 0.67>0.26.  Blood cultures no growth to date. Sepsis physiology has resolved.   Pancreatic cancer metastasized to liver [facility])- (present on admission) Patient with metastatic pancreatic cancer followed by Dr. Burr Medico as outpatient for palliative chemotherapy.  Presenting with gastric outlet obstruction secondary to pancreatic head lesion.  Was on NG tube during hospitalization for decompression.  Patient does have poor prognosis and poor tolerance to chemotherapy and is not a candidate for further cytotoxic treatment. Due to patient wishes for tube feeding, underwent gastrojejunostomy tube placement by IR on 01/24/2022.  Tube feedings have been initiated.   Paroxysmal atrial fibrillation ([place])- (present on admission) Heart rate is controlled.  On Eliquis for anticoagulation.  Hypomagnesemia Improved  Hypophosphatemia Replenished   General weakness Debility and deconditioning due to advanced cancer.  Continue supportive care.  Patient will be on hospice at home  Type 2 diabetes mellitus with diabetic polyneuropathy, with long-term current use of insulin (HCC) Received sliding scale during hospitalization.  Cancer related pain Palliative care on board.  On fentanyl patch and Dilaudid through the  tube.  Patient will be on hospice care on discharge.  AKI (acute kidney injury) ([place])- (present on admission) Resolved at this time.  Thought to be  secondary to volume depletion.  CT scan of the abdomen without any hydronephrosis.  Latest creatinine of 0.6  Goals of care, counseling/discussion Palliative care on board.  Plan for home with hospice today.   Consultants:  Oncology General surgery Gastroenterology Palliative care Interventional radiology   Procedures performed:  Gastrojejunostomy tube placement by interventional radiology on 01/13/2022.   Disposition: Hospice care Diet recommendation:  Discharge Diet Orders (From admission, onward)     Start     Ordered   01/19/22 0000  Diet full liquid        01/19/22 1236           Full liquid diet  DISCHARGE MEDICATION: Allergies as of 01/19/2022       Reactions   Penicillins Shortness Of Breath, Itching, Swelling   Has patient had a PCN reaction causing immediate rash, facial/tongue/throat swelling, SOB or lightheadedness with hypotension: Yes Has patient had a PCN reaction causing severe rash involving mucus membranes or skin necrosis:No Has patient had a PCN reaction that required hospitalization: No Has patient had a PCN reaction occurring within the last 10 years: no If all of the above answers are "NO", then may proceed with Cephalosporin use. *Per patient, tolerated Keflex in the past          Medication List     STOP taking these medications    doxycycline 100 MG tablet Commonly known as: ADOXA   Dulcolax 5 MG EC tablet Generic drug: bisacodyl Replaced by: bisacodyl 10 MG suppository   gabapentin 300 MG capsule Commonly known as: NEURONTIN   lidocaine-prilocaine cream Commonly known as: EMLA   loratadine 10 MG tablet Commonly known as: CLARITIN   LORazepam 0.5 MG tablet Commonly known as: ATIVAN   omeprazole 20 MG capsule Commonly known as: PRILOSEC   Oxycodone HCl 10 MG Tabs   pravastatin 40 MG tablet Commonly known as: PRAVACHOL       TAKE these medications    acetaminophen 500 MG tablet Commonly known as: TYLENOL Take  1,000 mg by mouth every 6 (six) hours as needed for moderate pain.   amLODipine 10 MG tablet Commonly known as: NORVASC Take 1 tablet (10 mg total) by mouth at bedtime.   apixaban 5 MG Tabs tablet Commonly known as: Eliquis Take 1 tablet (5 mg total) by mouth 2 (two) times daily.   BIOFREEZE ROLL-ON EX Apply 1 application topically as needed (pain).   bisacodyl 10 MG suppository Commonly known as: DULCOLAX Place 1 suppository (10 mg total) rectally daily as needed for moderate constipation. Replaces: Dulcolax 5 MG EC tablet   feeding supplement (KATE FARMS STANDARD 1.4) Liqd liquid Place 1,560 mLs into feeding tube continuous.   fentaNYL 75 MCG/HR Commonly known as: [place] 1 patch onto the skin every 3 (three) days. Start taking on: January 21, 2022   free water Soln Place 100 mLs into feeding tube every 3 (three) hours.   HYDROmorphone 2 MG tablet Commonly known as: DILAUDID Place 3 tablets (6 mg total) into feeding tube every 3 (three) hours as needed for severe pain or moderate pain.   magic mouthwash w/lidocaine Soln Take 5 mLs by mouth 4 (four) times daily as needed for mouth pain.   mirtazapine 15 MG tablet Commonly known as: Remeron Take 1 tablet (15 mg total) by mouth at bedtime.  multivitamin capsule Take 1 capsule by mouth daily.   ondansetron 8 MG tablet Commonly known as: ZOFRAN TAKE 1 TABLET(8 MG) BY MOUTH TWICE DAILY AS NEEDED FOR NAUSEA OR VOMITING   pantoprazole 40 MG tablet Commonly known as: Protonix Take 1 tablet (40 mg total) by mouth 2 (two) times daily before a meal.   phosphorus 155-852-130 MG tablet Commonly known as: K PHOS NEUTRAL Take 1 tablet (250 mg total) by mouth daily for 5 days.   polyethylene glycol 17 g packet Commonly known as: MIRALAX / GLYCOLAX Take 17 g by mouth daily as needed for moderate constipation.   prochlorperazine 10 MG tablet Commonly known as: COMPAZINE TAKE 1 TABLET(10 MG) BY MOUTH EVERY 6 HOURS  AS NEEDED FOR NAUSEA OR VOMITING What changed: See the new instructions.   senna-docusate 8.6-50 MG tablet Commonly known as: Senokot-S Take 1 tablet by mouth 2 (two) times daily.   Synjardy XR 12.04-999 MG Tb24 Generic drug: Empagliflozin-metFORMIN HCl ER Take 1 tablet by mouth 2 (two) times daily.   trolamine salicylate 10 % cream Commonly known as: ASPERCREME Apply 1 application topically 2 (two) times daily as needed for muscle pain.           Subjective. Patient was seen and examined at bedside.  Patient denies any nausea vomiting fever or chills.  Has been tolerating oral diet.  Complains of mild pain in the abdomen.  Discharge Exam: Filed Weights   01/08/22 1229 01/17/22 0500 01/18/22 0500  Weight: 136 kg (!) 150.4 kg (!) 150.1 kg   Vitals with BMI 01/19/2022 01/19/2022 01/18/2022  Height - - -  Weight - - -  BMI - - -  Systolic 093 267 124  Diastolic 68 71 81  Pulse 87 101 89    General:  Average built, not in obvious distress HENT:   No scleral pallor or icterus noted. Oral mucosa is moist.  Chest:  Clear breath sounds.  Diminished breath sounds bilaterally. No crackles or wheezes.  CVS: S1 &S2 heard. No murmur.  Regular rate and rhythm. Abdomen: Soft, nontender, nondistended.  Bowel sounds are heard.   Extremities: No cyanosis, clubbing or edema.  Peripheral pulses are palpable. Psych: Alert, awake and oriented, normal mood CNS:  No cranial nerve deficits.  Power equal in all extremities.   Skin: Warm and dry.  No rashes noted.   Condition at discharge: stable  The results of significant diagnostics from this hospitalization (including imaging, microbiology, ancillary and laboratory) are listed below for reference.   Imaging Studies: CT ABDOMEN PELVIS WO CONTRAST  Result Date: 01/07/2022 CLINICAL DATA:  Metastatic pancreatic cancer. Hyperglycemia. Possible small bowel obstruction. EXAM: CT ABDOMEN AND PELVIS WITHOUT CONTRAST TECHNIQUE: Multidetector CT  imaging of the abdomen and pelvis was performed following the standard protocol without IV contrast. RADIATION DOSE REDUCTION: This exam was performed according to the departmental dose-optimization program which includes automated exposure control, adjustment of the mA and/or kV according to patient size and/or use of iterative reconstruction technique. COMPARISON:  10/25/2021 FINDINGS: Lower chest: Ground-glass opacity seen in the posterior right lower lobe previously has become more confluent and organized in the interval with a masslike character today measuring 2.2 x 1.5 cm. There is patchy airspace disease in the lingula and left lower lobe, new in the interval. Hepatobiliary: No focal abnormality in the liver on this study without intravenous contrast. The liver shows diffusely decreased attenuation suggesting fat deposition. Increased attenuation of contents in the gallbladder lumen may be related to  sludge. No intrahepatic or extrahepatic biliary dilation. Pancreas: Interval decrease in size of the pancreatic head mass. Lesion is poorly defined on today's noncontrast study but measures approximately 4.1 x 4.0 cm compared to 6.0 x 5.3 cm previously. There is some trace gas in the region of the mass lesion today which could represent necrosis although superinfection cannot be excluded. Spleen: No splenomegaly. No focal mass lesion. Adrenals/Urinary Tract: No adrenal nodule or mass. Kidneys unremarkable. No evidence for hydroureter. Bladder is decompressed. Stomach/Bowel: Stomach is markedly distended with fluid. Duodenum is decompressed and small bowel loops are diffusely decompressed throughout. The terminal ileum is normal. The appendix is normal. Colon is decompressed with mild diverticular disease on the left. Vascular/Lymphatic: There is mild atherosclerotic calcification of the abdominal aorta without aneurysm. There is no gastrohepatic or hepatoduodenal ligament lymphadenopathy. No retroperitoneal or  mesenteric lymphadenopathy. No pelvic sidewall lymphadenopathy. Reproductive: IUD is visualized in the uterus. There is no adnexal mass. Other: No intraperitoneal free fluid. Musculoskeletal: Small umbilical hernia contains only fat. No worrisome lytic or sclerotic osseous abnormality. IMPRESSION: 1. Interval decrease in size of the pancreatic head mass, poorly demonstrated on today's noncontrast CT. Trace gas in the region of the mass lesion today could represent necrosis although superinfection cannot be excluded. 2. Marked distention of the stomach with fluid. Duodenum is decompressed and small bowel loops are diffusely decompressed throughout. Imaging features are compatible with gastric outlet obstruction, likely secondary to involvement by the pancreatic head lesion. 3. Interval development of patchy airspace disease in the lingula and left lower lobe. Imaging features are compatible with infection/inflammation. 4. Ground-glass opacity in the posterior right lower lobe has become more confluent and organized in the interval with a masslike character today measuring 2.2 x 1.5 cm. While likely infectious/inflammatory, close follow-up recommended. 5. Hepatic steatosis. 6. Small umbilical hernia contains only fat. 7. Aortic Atherosclerosis (ICD10-I70.0). Electronically Signed   By: Misty Stanley M.D.   On: 01/07/2022 06:55   DG Abd 1 View  Result Date: 01/12/2022 CLINICAL DATA:  Abdominal distention EXAM: ABDOMEN - 1 VIEW COMPARISON:  CT done on 01/07/2022 FINDINGS: Bowel gas pattern is nonspecific. Small amount of stool is seen in colon. Liver appears to be enlarged in size. No abnormal calcifications are seen. IUD is seen in pelvis. There is stent in the course of first and second portions of duodenum. Pacemaker leads are seen in the heart. IMPRESSION: Nonspecific bowel gas pattern. Stent is seen in the region of duodenum. Electronically Signed   By: Elmer Picker M.D.   On: 01/12/2022 14:50   CT HEAD  WO CONTRAST (5MM)  Result Date: 01/01/2022 CLINICAL DATA:  Nonspecific dizziness with slight headache. On Eliquis. EXAM: CT HEAD WITHOUT CONTRAST TECHNIQUE: Contiguous axial images were obtained from the base of the skull through the vertex without intravenous contrast. RADIATION DOSE REDUCTION: This exam was performed according to the departmental dose-optimization program which includes automated exposure control, adjustment of the mA and/or kV according to patient size and/or use of iterative reconstruction technique. COMPARISON:  Sixteen days ago FINDINGS: Brain: No evidence of acute infarction, hemorrhage, hydrocephalus, extra-axial collection or mass lesion/mass effect. Dilated perivascular space below the left putamen. Notable cerebellar volume loss for age. Vascular: No hyperdense vessel or unexpected calcification. Skull: Normal. Negative for fracture or focal lesion. Sinuses/Orbits: No acute finding. IMPRESSION: No acute or interval finding. Electronically Signed   By: Jorje Guild M.D.   On: 01/01/2022 06:11   IR GASTROSTOMY TUBE MOD SED  Result Date:  01/14/2022 INDICATION: 61 year old female with pancreatic adenocarcinoma and malignant gastric outlet obstruction. She recently underwent duodenal stent placement. She presents for attempted percutaneous gastrostomy tube placement with J arm extension. EXAM: PERC PLACEMENT GASTROSTOMY MEDICATIONS: None ANESTHESIA/SEDATION: Versed 2 mg IV; Fentanyl 100 mcg IV Moderate Sedation Time:  45 minutes The patient's vital signs and level of consciousness were continuously monitored during the procedure by the interventional radiology nurse under my direct supervision. CONTRAST:  88mL OMNIPAQUE IOHEXOL 300 MG/ML SOLN - administered into the gastric lumen. FLUOROSCOPY TIME:  417 mGy air kerma COMPLICATIONS: None immediate. PROCEDURE: Informed written consent was obtained from the patient after a thorough discussion of the procedural risks, benefits and  alternatives. All questions were addressed. Maximal Sterile Barrier Technique was utilized including caps, mask, sterile gowns, sterile gloves, sterile drape, hand hygiene and skin antiseptic. A timeout was performed prior to the initiation of the procedure. A 5 French catheter was advanced through the mouth, down the esophagus and into the stomach. The stomach was insufflated with air. Under real-time fluoroscopic guidance, 2 T tacks were placed in the region of the junction of the gastric body and gastric antrum. Then, the anterior gastric wall was punctured with an 18 gauge single wall needle. Contrast was injected through the needle confirming presence of the needle tip within the gastric lumen. An Amplatz wire was then advanced into the stomach. The needle was removed. A 10 x 80 mm [employer] balloon was then advanced coaxially through a 24 [place] Entuit balloon retention gastrostomy tube. The balloon was advanced and positioned across the anterior abdominal wall tract into the stomach. The balloon was then inflated. As the balloon was deflated, the balloon and G-tube were advanced as a unit into the gastric lumen. The retention balloon was filled with 9 mL dilute contrast and pulled snug against the anterior abdominal wall. The Amplatz wire was removed. An angled catheter was advanced coaxially through the newly placed gastrostomy tube and was successfully advanced through the duodenal stent and into the proximal jejunum using a combination of wires. Ultimately, a superstiff Amplatz wire was advanced into the proximal jejunum. A 9 French J arm extension was then advanced over the wire and position with the tip in the proximal jejunum. Contrast was injected through both the jejunal and gastric lumens confirming that the tip of the J arm is indeed within the small bowel and the gastric lumen is within the stomach. The external bumper was secured in place. Images were obtained and stored for the medical record.  IMPRESSION: Successful placement of a 20 French balloon retention gastrostomy tube with a 9 [place] J arm extension. The tip of the J arm is within the small bowel and ready for immediate use. The G portion of the tube can be used for gastric decompression as needed. Electronically Signed   By: Jacqulynn Cadet M.D.   On: 01/14/2022 18:18   IR REMOVAL TUN ACCESS W/ PORT W/O FL MOD SED  Result Date: 01/02/2022 INDICATION: 61 year old with pancreatic cancer. Port-A-Cath was placed on 11/11/2021. Partial incision dehiscence was noted and there is concern for port infection. Plan for port removal. EXAM: REMOVAL RIGHT IJ VEIN PORT-A-CATH MEDICATIONS: Moderate sedation ANESTHESIA/SEDATION: Moderate (conscious) sedation was employed during this procedure. A total of Versed 2.0mg  and fentanyl 100 mcg was administered intravenously at the order of the provider performing the procedure. Total intra-service moderate sedation time: 23 minutes. Patient's level of consciousness and vital signs were monitored continuously by radiology nurse throughout the procedure under  the supervision of the provider performing the procedure. FLUOROSCOPY TIME:  None COMPLICATIONS: None immediate. PROCEDURE: Informed written consent was obtained from the patient after a thorough discussion of the procedural risks, benefits and alternatives. All questions were addressed. Maximal Sterile Barrier Technique was utilized including caps, mask, sterile gowns, sterile gloves, sterile drape, hand hygiene and skin antiseptic. A timeout was performed prior to the initiation of the procedure. The right chest was prepped and draped in a sterile fashion. Lidocaine with epinephrine was utilized for local anesthesia. An incision was made over the port incision. Utilizing blunt dissection, the port catheter and reservoir were removed from the underlying subcutaneous tissue in their entirety. The pocket was irrigated with a copious amount of sterile normal  saline. Pocket was closed using 3 interrupted 2-0 Ethilon sutures. Dressing was placed over the incision. FINDINGS: The port incision was almost completely closed. There was small amount of dry blood or scab formation along the medial aspect of the incision. No significant erythema around the port. Incision was made over the old incision. No evidence for purulent drainage. Small amount of bloody fluid was removed from the pocket. Inspection of the port pocket demonstrated normal healthy tissue. Pocket was rigorously irrigated with saline. Incision was closed using interrupted sutures. IMPRESSION: Successful right IJ vein Port-A-Cath explant. Patient has interrupted sutures that will be removed in approximately 2 weeks. Electronically Signed   By: Markus Daft M.D.   On: 01/02/2022 14:19   DG Chest Port 1 View  Result Date: 01/07/2022 CLINICAL DATA:  Possible sepsis. EXAM: PORTABLE CHEST 1 VIEW COMPARISON:  01/01/2022 FINDINGS: 0605 hours low lung volumes. The cardio pericardial silhouette is enlarged. There is pulmonary vascular congestion without overt pulmonary edema. No evidence for pleural effusion. Prominent gastric bubble noted under the left hemidiaphragm. Left permanent pacemaker noted. Telemetry leads overlie the chest. IMPRESSION: Low volume chest x-ray with pulmonary vascular congestion. Electronically Signed   By: Misty Stanley M.D.   On: 01/07/2022 06:33   DG Chest Portable 1 View  Result Date: 01/01/2022 CLINICAL DATA:  60 year old female with history of dizziness. EXAM: PORTABLE CHEST 1 VIEW COMPARISON:  Chest x-ray 06/15/2021. FINDINGS: Lung volumes are low. No consolidative airspace disease. No pleural effusions. No pneumothorax. No pulmonary nodule or mass noted. Pulmonary vasculature and the cardiomediastinal silhouette are within normal limits. Left-sided pacemaker device in place with lead tips projecting over the expected location of the right atrium and right ventricle. Right internal  jugular single-lumen power porta cath with tip terminating in the mid superior vena cava. IMPRESSION: 1. Low lung volumes without radiographic evidence of acute cardiopulmonary disease. Electronically Signed   By: Vinnie Langton M.D.   On: 01/01/2022 05:39   DG C-Arm 1-60 Min  Result Date: 01/08/2022 CLINICAL DATA:  History of metastatic pancreatic cancer now with duodenal obstruction. EXAM: ERCP TECHNIQUE: Multiple spot images obtained with the fluoroscopic device and submitted for interpretation post-procedure. FLUOROSCOPY TIME: FLUOROSCOPY TIME 1 minute, 7 seconds (29.3 mGy) COMPARISON:  CT abdomen and pelvis-01/07/2022 FINDINGS: Four spot intraoperative fluoroscopic images of the right upper abdominal quadrant during ERCP are provided for review. Initial image demonstrates an ERCP probe overlying the right upper abdominal quadrant. Subsequent images demonstrate placement of a enteric stent which appears to traverse the expected location of the gastric antrum, duodenal bulb and proximal aspect of the descending duodenum. IMPRESSION: Post gastric outlet and proximal duodenal enteric stent placement. These images were submitted for radiologic interpretation only. Please see the procedural report for the  amount of contrast and the fluoroscopy time utilized. Electronically Signed   By: Sandi Mariscal M.D.   On: 01/08/2022 14:05   CUP PACEART REMOTE DEVICE CHECK  Result Date: 01/09/2022 Scheduled remote reviewed. Normal device function.  7 NSVT, 5 fast AV.  All EGM's show regular 1:1, rates 154-182, longest duration 51min 50sec Overall controlled ventricular rates Decrease in activity noted Next remote 91 days. [place]   Microbiology: Results for orders placed or performed during the hospital encounter of 01/07/22  Blood Culture (routine x 2)     Status: None   Collection Time: 01/07/22  5:59 AM   Specimen: Site Not Specified; Blood  Result Value Ref Range Status   Specimen Description   Final    SITE NOT  SPECIFIED Performed at [facility] [address] [place], [place] [address]    Special Requests   Final    BOTTLES DRAWN AEROBIC AND ANAEROBIC Blood Culture adequate volume Performed at [facility] [address] [place], [place] [address]    Culture   Final    NO GROWTH 5 DAYS Performed at [facility], [place] [address] [place], [place] [address]    Report Status 01/12/2022 FINAL  Final  Urine Culture     Status: None   Collection Time: 01/07/22  5:59 AM   Specimen: In/Out Cath Urine  Result Value Ref Range Status   Specimen Description   Final    IN/OUT CATH URINE Performed at [facility] [address], [place], [place] [address]    Special Requests   Final    NONE Performed at [facility], [place] [address], [place], [place] [address]    Culture   Final    NO GROWTH Performed at [facility], [place] [address], [place], [place] [address]    Report Status 01/09/2022 FINAL  Final  Resp Panel by RT-PCR (Flu A&B, Covid) Nasopharyngeal Swab     Status: None   Collection Time: 01/07/22  8:38 AM   Specimen: Nasopharyngeal Swab; Nasopharyngeal(NP) swabs in vial transport medium  Result Value Ref Range Status   SARS Coronavirus 2 by RT PCR NEGATIVE NEGATIVE Final    Comment: (NOTE) SARS-CoV-2 target nucleic acids are NOT DETECTED.  The SARS-CoV-2 RNA is generally detectable in upper respiratory specimens during the acute phase of infection. The lowest concentration of SARS-CoV-2 viral copies this assay can detect is 138 copies/mL. A negative result does not preclude SARS-Cov-2 infection and should not be used as the sole basis for treatment or other patient management decisions. A negative result may occur with  improper specimen collection/handling, submission of specimen other than nasopharyngeal swab, presence of viral mutation(s) within the areas targeted by this assay, and  inadequate number of viral copies(<138 copies/mL). A negative result must be combined with clinical observations, patient history, and epidemiological information. The expected result is Negative.  Fact Sheet for Patients:  EntrepreneurPulse.com.au  Fact Sheet for Healthcare Providers:  IncredibleEmployment.be  This test is no t yet approved or cleared by the [place] FDA and  has been authorized for detection and/or diagnosis of SARS-CoV-2 by FDA under an Emergency Use Authorization (EUA). This EUA will remain  in effect (meaning this test can be used) for the duration of the COVID-19 declaration under Section 564(b)(1) of the Act, 21 U.S.C.section 360bbb-3(b)(1), unless the authorization is terminated  or revoked sooner.       Influenza A by PCR NEGATIVE  NEGATIVE Final   Influenza B by PCR NEGATIVE NEGATIVE Final    Comment: (NOTE) The Xpert Xpress SARS-CoV-2/FLU/RSV plus assay is intended as an aid in the diagnosis of influenza from Nasopharyngeal swab specimens and should not be used as a sole basis for treatment. Nasal washings and aspirates are unacceptable for Xpert Xpress SARS-CoV-2/FLU/RSV testing.  Fact Sheet for Patients: EntrepreneurPulse.com.au  Fact Sheet for Healthcare Providers: IncredibleEmployment.be  This test is not yet approved or cleared by the [place] FDA and has been authorized for detection and/or diagnosis of SARS-CoV-2 by FDA under an Emergency Use Authorization (EUA). This EUA will remain in effect (meaning this test can be used) for the duration of the COVID-19 declaration under Section 564(b)(1) of the Act, 21 U.S.C. section 360bbb-3(b)(1), unless the authorization is terminated or revoked.  Performed at [facility], [place] [address], [place], [place] [address]   Blood Culture (routine x 2)     Status: None   Collection Time: 01/07/22  5:17  PM   Specimen: BLOOD  Result Value Ref Range Status   Specimen Description   Final    BLOOD RIGHT ANTECUBITAL Performed at [place] [address], [place], [place] [address]    Special Requests   Final    BOTTLES DRAWN AEROBIC ONLY Blood Culture results may not be optimal due to an inadequate volume of blood received in culture bottles Performed at [place] [address], [place], [place] [address]    Culture   Final    NO GROWTH 5 DAYS Performed at [facility], [place] [address], [place], [place] [address]    Report Status 01/12/2022 FINAL  Final    Labs: CBC: Recent Labs  Lab 01/14/22 0447 01/15/22 0423 01/16/22 0452 01/17/22 0444 01/18/22 0445 01/19/22 0430  WBC 14.4* 10.8* 9.7 9.9 9.9 10.1  NEUTROABS 10.7*  --   --   --   --   --   HGB 8.0* 8.0* 7.6* 8.1* 7.5* 7.7*  HCT 25.5* 26.4* 24.4* 27.1* 24.3* 24.9*  MCV 94.1 96.0 93.8 97.8 95.3 95.0  PLT 290 281 302 337 359 144   Basic Metabolic Panel: Recent Labs  Lab 01/13/22 0455 01/14/22 0447 01/16/22 1148 01/17/22 0444 01/17/22 1654 01/18/22 0445 01/18/22 1633 01/19/22 0430  NA 139 139  --  138  --   --   --   --   K 3.5 3.5  --  3.8  --   --   --   --   CL 105 105  --  107  --   --   --   --   CO2 26 27  --  25  --   --   --   --   GLUCOSE 210* 191*  --  212*  --   --   --   --   BUN 11 9  --  10  --   --   --   --   CREATININE 0.89 0.80  --  0.66  --   --   --   --   CALCIUM 7.6* 7.8*  --  7.3*  --   --   --   --   MG  --  1.6*   < > 1.6* 1.9 1.8 1.8 1.8  PHOS  --  1.9*   < > 2.8 2.7 2.8 2.4* 2.1*   < > = values in this interval not displayed.  Liver Function Tests: No results for input(s): AST, ALT, ALKPHOS, BILITOT, PROT, ALBUMIN in the last 168 hours. CBG: Recent Labs  Lab 01/18/22 2028 01/19/22 0028 01/19/22 0404 01/19/22 0731 01/19/22 1134  GLUCAP 186* 238* 263* 278* 262*    Discharge time spent: greater than 30  minutes.  Signed: Flora Lipps, MD [facility] 01/19/2022

## 2022-01-19 NOTE — Progress Notes (Signed)
Patient has not had BM since 2/9.  Suppository given at 1330, enema given at 1530.  Patient did not pass any stool.  Large hard stool ball felt in patient's rectum.  RN attempted digital removal, patient tolerated poorly.  Unable to disimpact.  MD notified.  Angie Fava, RN

## 2022-01-19 NOTE — Progress Notes (Signed)
° °  This pt has been approved to go home with hospice services under [facility].  I have ordered equipment for delivery to the home after talking with the daughter.   Bedside Commode, hospital bed, suction, oxygen, Kangaroo pump for feedings continuously to be delivered today.   Webb Silversmith RN [phone]

## 2022-01-19 NOTE — Progress Notes (Signed)
NUTRITION NOTE  Secure chat message received that patient to go home with [facility] who is unable to provide [facility] 1.4 tube feeding.   Patient to discharge home with TF regimen of Vital 1.5 @ 60 ml/hr with 100 ml free water every 3 hours via J-tube. This regimen will provide 2160 kcal, 97 grams protein, and 1900 ml free water.  Will contact Pharmacy about request from [place] agency for patient to be sent home with a 3 day supply of tube feeding formula.     Jarome Matin, MS, RD, LDN Inpatient Clinical Dietitian RD pager # available in [place]  After hours/weekend pager # available in [facility]

## 2022-01-20 ENCOUNTER — Encounter: Payer: 59 | Admitting: Cardiology

## 2022-01-20 LAB — CBC
HCT: 26.2 % — ABNORMAL LOW (ref 36.0–46.0)
Hemoglobin: 8.3 g/dL — ABNORMAL LOW (ref 12.0–15.0)
MCH: 30.2 pg (ref 26.0–34.0)
MCHC: 31.7 g/dL (ref 30.0–36.0)
MCV: 95.3 fL (ref 80.0–100.0)
Platelets: 400 10*3/uL (ref 150–400)
RBC: 2.75 MIL/uL — ABNORMAL LOW (ref 3.87–5.11)
RDW: 23.9 % — ABNORMAL HIGH (ref 11.5–15.5)
WBC: 12.5 10*3/uL — ABNORMAL HIGH (ref 4.0–10.5)
nRBC: 0 % (ref 0.0–0.2)

## 2022-01-20 LAB — GLUCOSE, CAPILLARY
Glucose-Capillary: 219 mg/dL — ABNORMAL HIGH (ref 70–99)
Glucose-Capillary: 254 mg/dL — ABNORMAL HIGH (ref 70–99)
Glucose-Capillary: 269 mg/dL — ABNORMAL HIGH (ref 70–99)
Glucose-Capillary: 320 mg/dL — ABNORMAL HIGH (ref 70–99)
Glucose-Capillary: 254 mg/dL — ABNORMAL HIGH (ref 70–99)
Glucose-Capillary: 250 mg/dL — ABNORMAL HIGH (ref 70–99)

## 2022-01-20 LAB — PHOSPHORUS: Phosphorus: 2.5 mg/dL (ref 2.5–4.6)

## 2022-01-20 LAB — MAGNESIUM: Magnesium: 1.9 mg/dL (ref 1.7–2.4)

## 2022-01-20 MED ORDER — FENTANYL 75 MCG/HR TD PT72: 2.0000 | MEDICATED_PATCH | TRANSDERMAL | 0 refills | Status: AC

## 2022-01-20 NOTE — TOC Transition Note (Signed)
Transition of Care [facility]) - CM/SW Discharge Note   Patient Details  Name: Leslie Mullen MRN: [id number] Date of Birth: 1961/08/26  [facility]) CM/SW Contact:  Dessa Phi, RN Phone Number: 01/20/2022, 11:41 AM   Clinical Narrative: d/c home w/[facility] rep Cheri accepted-awaiting all dme to arrive in home-Cheri managing-[place] will inform Cheri when dme arrives. PTAR will be called. No further CM needs.    Final next level of care: Home w Hospice Care Barriers to Discharge: No Barriers Identified   Patient Goals and CMS Choice Patient states their goals for this hospitalization and ongoing recovery are:: home w/hospice CMS Medicare.gov Compare Post Acute Care list provided to:: Patient Represenative (must comment) Choice offered to / list presented to : Patient, Adult Children  Discharge Placement                Patient to be transferred to facility by: [place] Name of family member notified: [facility] Patient and family notified of of transfer: 01/20/22  Discharge Plan and Services     Post Acute Care Choice: NA                      [place] Agency: [place] ([place]) Interventions     Readmission Risk Interventions Readmission Risk Prevention Plan 01/08/2022  Transportation Screening Complete  Medication Review Press photographer) Complete  PCP or Specialist appointment within 3-5 days of discharge Complete  HRI or [place] Complete  SW Recovery Care/Counseling Consult Complete  [place] Not Applicable  Some recent data might be hidden

## 2022-01-20 NOTE — Plan of Care (Signed)

## 2022-01-20 NOTE — Progress Notes (Signed)
Today, patient was seen and examined at bedside.  Patient denies interval complaints.  Has had a bowel movement today.  Vitals reviewed.  No interval changes in the assessment and plan.  Discharge instructions have been made on 2/13/ 2023.  Awaiting for set up by hospice at home with different gadgets.

## 2022-01-20 NOTE — Plan of Care (Signed)
  Problem: Nutrition: Goal: Adequate nutrition will be maintained Outcome: Progressing   

## 2022-01-20 NOTE — Progress Notes (Signed)
Pharmacy Note   Five containers of the one liter Vital 1.5  tube feeds were delivered to unit to be taken as outpatient.    Royetta Asal, PharmD, BCPS 01/20/2022 2:39 PM

## 2022-01-20 NOTE — Progress Notes (Signed)
Oncology brief follow up note   I stopped by to see Leslie Mullen.  She just had a bowel movement and was cleaned by nurse tech.  She appears to be comfortable in bed.  No family around.  She has decided to go home with hospice and tube feeds. But she says "I am not ready to go home yet", still waiting for pain to be better controlled, and seems to be set up at home.  She eventually would like to go to residential hospice when she needs more care.  I encouraged her to call me after discharge if needed. I will be happy to remain as her attending when she is on hospice care.  Truitt Merle  01/20/2022

## 2022-01-21 LAB — GLUCOSE, CAPILLARY
Glucose-Capillary: 185 mg/dL — ABNORMAL HIGH (ref 70–99)
Glucose-Capillary: 197 mg/dL — ABNORMAL HIGH (ref 70–99)
Glucose-Capillary: 213 mg/dL — ABNORMAL HIGH (ref 70–99)
Glucose-Capillary: 224 mg/dL — ABNORMAL HIGH (ref 70–99)
Glucose-Capillary: 245 mg/dL — ABNORMAL HIGH (ref 70–99)
Glucose-Capillary: 268 mg/dL — ABNORMAL HIGH (ref 70–99)

## 2022-01-21 LAB — CBC
HCT: 25.6 % — ABNORMAL LOW (ref 36.0–46.0)
Hemoglobin: 8 g/dL — ABNORMAL LOW (ref 12.0–15.0)
MCH: 29.7 pg (ref 26.0–34.0)
MCHC: 31.3 g/dL (ref 30.0–36.0)
MCV: 95.2 fL (ref 80.0–100.0)
Platelets: 343 10*3/uL (ref 150–400)
RBC: 2.69 MIL/uL — ABNORMAL LOW (ref 3.87–5.11)
RDW: 24.2 % — ABNORMAL HIGH (ref 11.5–15.5)
WBC: 12.3 10*3/uL — ABNORMAL HIGH (ref 4.0–10.5)
nRBC: 0 % (ref 0.0–0.2)

## 2022-01-21 NOTE — Progress Notes (Signed)
Patient seen and examined at bedside. Discharge ordered on 6/11 but is complicated secondary to lack of care at home. Patient is currently 2+ assist. TOC and Hospice coordinating to ensure patient has adequate care at home prior to discharge. If unable to discharge home, may need to consider SNF discharge, but patient would not receive hospice care. Plan at this time is to follow-up with family's attempts at obtaining home care to ensure a safe discharge.  Cordelia Poche, MD [facility] 01/21/2022, 8:00 PM

## 2022-01-21 NOTE — TOC Transition Note (Addendum)
Transition of Care [facility]) - CM/SW Discharge Note   Patient Details  Name: Leslie Mullen MRN: [id number] Date of Birth: 09/10/1961  Transition of Care [facility]) CM/SW Contact:  Dessa Phi, RN Phone Number: 01/21/2022, 9:09 AM   Clinical Narrative:All DME has been delivered to home. [facility] rep Cheri-accepted & waiting for d/c to initiate home services. Noted concerns about care at home & services-[facility] rep aware,Nsg to provide education on all d/c instructions.PTAR to be called once ready.  -10:38a-per attending waiting on private duty care being set up-[facility] has emailed dtr w/list-she will f/u.      Final next level of care: Home w Hospice Care Barriers to Discharge: No Barriers Identified   Patient Goals and CMS Choice Patient states their goals for this hospitalization and ongoing recovery are:: home w/hospice CMS Medicare.gov Compare Post Acute Care list provided to:: Patient Represenative (must comment) Choice offered to / list presented to : Patient, Adult Children  Discharge Placement                Patient to be transferred to facility by: [place] Name of family member notified: [facility] Patient and family notified of of transfer: 01/20/22  Discharge Plan and Services     Post Acute Care Choice: NA                      [place] Agency: [place] ([place]) Interventions     Readmission Risk Interventions Readmission Risk Prevention Plan 01/08/2022  Transportation Screening Complete  Medication Review Press photographer) Complete  PCP or Specialist appointment within 3-5 days of discharge Complete  HRI or [place] Complete  SW Recovery Care/Counseling Consult Complete  [place] Not Applicable  Some recent data might be hidden

## 2022-01-22 DIAGNOSIS — K311 Adult hypertrophic pyloric stenosis: Secondary | ICD-10-CM | POA: Diagnosis not present

## 2022-01-22 DIAGNOSIS — N179 Acute kidney failure, unspecified: Secondary | ICD-10-CM | POA: Diagnosis not present

## 2022-01-22 DIAGNOSIS — R531 Weakness: Secondary | ICD-10-CM | POA: Diagnosis not present

## 2022-01-22 LAB — CBC
HCT: 25.7 % — ABNORMAL LOW (ref 36.0–46.0)
Hemoglobin: 8 g/dL — ABNORMAL LOW (ref 12.0–15.0)
MCH: 29.5 pg (ref 26.0–34.0)
MCHC: 31.1 g/dL (ref 30.0–36.0)
MCV: 94.8 fL (ref 80.0–100.0)
Platelets: 298 10*3/uL (ref 150–400)
RBC: 2.71 MIL/uL — ABNORMAL LOW (ref 3.87–5.11)
RDW: 24.2 % — ABNORMAL HIGH (ref 11.5–15.5)
WBC: 11.7 10*3/uL — ABNORMAL HIGH (ref 4.0–10.5)
nRBC: 0 % (ref 0.0–0.2)

## 2022-01-22 LAB — GLUCOSE, CAPILLARY
Glucose-Capillary: 176 mg/dL — ABNORMAL HIGH (ref 70–99)
Glucose-Capillary: 201 mg/dL — ABNORMAL HIGH (ref 70–99)
Glucose-Capillary: 210 mg/dL — ABNORMAL HIGH (ref 70–99)
Glucose-Capillary: 234 mg/dL — ABNORMAL HIGH (ref 70–99)
Glucose-Capillary: 252 mg/dL — ABNORMAL HIGH (ref 70–99)
Glucose-Capillary: 264 mg/dL — ABNORMAL HIGH (ref 70–99)
Glucose-Capillary: 276 mg/dL — ABNORMAL HIGH (ref 70–99)

## 2022-01-22 MED ORDER — SENNOSIDES-DOCUSATE SODIUM 8.6-50 MG PO TABS
2.0000 | ORAL_TABLET | Freq: Two times a day (BID) | ORAL | Status: DC
  Administered 2022-01-22 – 2022-01-25 (×4): 2 via ORAL
  Filled 2022-01-22 (×8): qty 2

## 2022-01-22 MED ORDER — PANTOPRAZOLE SODIUM 40 MG PO TBEC
40.0000 mg | DELAYED_RELEASE_TABLET | Freq: Two times a day (BID) | ORAL | Status: DC
  Administered 2022-01-22 – 2022-01-26 (×9): 40 mg via ORAL
  Filled 2022-01-22 (×9): qty 1

## 2022-01-22 NOTE — Progress Notes (Signed)
Nutrition Follow-up  DOCUMENTATION CODES:   Morbid obesity  INTERVENTION:  - during hospitalization, continue [employer] 1.4 via J-port at goal rate of 65 ml/hr with 100 ml free water every 3 hours.   - once discharged, Vital 1.5 @ 60 ml/hr via J-port with 100 ml free water every 3 hours.    NUTRITION DIAGNOSIS:   Increased nutrient needs related to cancer and cancer related treatments as evidenced by estimated needs. -ongoing  GOAL:   Patient will meet greater than or equal to 90% of their needs -met with TF regimen via J-tube  MONITOR:   TF tolerance, PO intake, Labs, Weight trends   ASSESSMENT:   Pt is 61 y.o. female with medical history significant of metastatic pancreatic cancer, HTN, HLD, neuropathy, endometrial cancer, CHB s/p PPM who presented to the ED with nausea/vomiting and hyperglycemia and admitted for gastric outlet obstruction. Pt was recently hospitalized from 1/26-1/30 for sepsis secondary to port infection.  Meal completion over the past 1 week is mainly 0-25% but she consumed 50% of breakfast yesterday; no meal completion documented since that time.  Patient laying in bed. No visitors present at the time of RD visit. Patient is pending d/c home with hospice once private duty care for in the home can be established.  Patient has G-J tube; G-port for venting/decompression and J-port for feeding.  She is receiving [place] 1.4 via J-port at goal rate of 65 ml/hr with 100 ml free water every 3 hours. This regimen is providing 2184 kcal, 96 grams protein, and 1923 ml free water.   [facility] is unable to provide [employer] [place] so patient will be switched to Vital 1.5 @ 60 ml/hr with 100 ml free water every 3 hours. This regimen will provide 2160 kcal, 97 grams protein, and 1900 ml free water.  Patient denies any nutrition-related questions, concerns, or needs at this time.   Weight has been trending up throughout hospitalization. Mild pitting edema to BUE and moderate  pitting edema to BLE documented in the edema section of flow sheet.    Labs reviewed; CBGs: 201, 252, 276 mg/dl, no BMP since 2/11, Mg and Phos last checked 2/14 AM and were both low end of WDL.   Medications reviewed; sliding scale novolog, 400 mg mag-ox BID, 40 mg oral protonix BID, 17 g miralax BID, 2 tablets senokot BID.    Diet Order:   Diet Order             Diet full liquid           Diet full liquid Room service appropriate? Yes; Fluid consistency: Thin  Diet effective now                   EDUCATION NEEDS:   Not appropriate for education at this time  Skin:  Skin Assessment: Skin Integrity Issues: Skin Integrity Issues:: Other (Comment) Other: ecchymosis  Last BM:  2/15 (type 7, small amount)  Height:   Ht Readings from Last 1 Encounters:  01/08/22 '5\' 8"'  (1.727 m)    Weight:   Wt Readings from Last 1 Encounters:  01/22/22 (!) 161.4 kg     BMI:  Body mass index is 54.1 kg/m.   Estimated Nutritional Needs:  Kcal:  2000 - 2200 Protein:  100 - 115 grams Fluid:  >/= 2 L     Jarome Matin, MS, RD, LDN Inpatient Clinical Dietitian RD pager # available in [place]  After hours/weekend pager # available in [facility]

## 2022-01-22 NOTE — Progress Notes (Signed)
° °  I have been working with the pt's daughter and provided a list of agencies that she could look into to hire some additional help for when pt is d/c home. As of today the daughter has chosen and agency and is waiting to hear from the agency when they will be able to start there care. Once I know the date I will update Juliann Pulse with CM.   Webb Silversmith RN [phone]

## 2022-01-22 NOTE — Progress Notes (Signed)
PROGRESS NOTE    Leslie Mullen  AQT:[id number] DOB: April 01, 1961 DOA: 01/07/2022 PCP: Sue Lush, PA-C   Brief Narrative: Patient is a 61 years old female with PMH significant for CHB, s/p PPM, DM 2, hypertension, metastatic pancreatic cancer, endometrial cancer, morbid obesity presented in the ED with complaints of nausea and vomiting.  She was recently hospitalized from 1/26 - 1/30 for sepsis secondary to port infection.  At that time she was sent home with 5 days of oral doxycycline.  Blood cultures were negative.  Patient has acutely developed nausea and vomiting,  She was unable to take anything down,  she has tried Compazine and antacids did not help.  At this time, patient was admitted for sepsis secondary to pancreatic infection versus left lower lobe pneumonia. She is also found to have gastric outlet obstruction, general surgery, gastroenterology, oncology, palliative care consulted.  She underwent EGD with stent, nausea has improved.  Now she has recurrence of nausea and abdominal pain.  Dr. Burr Medico had long discussion with the patient, and underwent G- J-tube placement for venting of the stomach and post duodenal stent feeding by IR on 01/14/22.  Patient has been initiated on tube feedings at this time. Patient has been considered for home hospice at this time with tube feedings.   Assessment and Plan: * Gastric outlet obstruction- (present on admission) Secondary to metastatic cancer.  Status post gastric jejunostomy tube with stent tube feeding.  Patient has been initiated on tube feeding.  On full liquids as well.  Has been tolerating well.  Cancer related pain Palliative care on board.  On fentanyl patch and Dilaudid through the tube.  Patient will be on hospice care on discharge.  General weakness Debility and deconditioning due to advanced cancer.  Continue supportive care.  Patient will be on hospice at home  Goals of care, counseling/discussion Decision made to discharge home  with hospice.  Pancreatic cancer metastasized to liver [facility])- (present on admission) Patient with metastatic pancreatic cancer followed by Dr. Burr Medico as outpatient for palliative chemotherapy.  Presenting with gastric outlet obstruction secondary to pancreatic head lesion.  Was on NG tube during hospitalization for decompression.  Patient does have poor prognosis and poor tolerance to chemotherapy and is not a candidate for further cytotoxic treatment. Due to patient wishes for tube feeding, underwent gastrojejunostomy tube placement by IR on 01/24/2022.  Tube feedings have been initiated.  Plan for discharge home with hospice.  Paroxysmal atrial fibrillation ([place])- (present on admission) Heart rate is controlled.  On Eliquis for anticoagulation. -Continue Eliquis  Type 2 diabetes mellitus with diabetic polyneuropathy, with long-term current use of insulin ([place]) Patient is on Synjardy as an outpatient which was held while inpatient. -Continue SSI  Hypomagnesemia-resolved as of 01/22/2022 Repleted. Resolved.  Hypophosphatemia-resolved as of 01/22/2022 Replenished. Resolve.d  Sepsis (HCC)-resolved as of 01/22/2022, (present on admission) Thought to be secondary to pancreatic infection versus left lower lobe pneumonia.  Has completed cefepime and Flagyl for 5 days. Procalcitonin 0.67>0.26.  Blood cultures no growth. Sepsis physiology has resolved.   AKI (acute kidney injury) (HCC)-resolved as of 01/22/2022, (present on admission) Resolved.  Thought to be secondary to volume depletion.  CT scan of the abdomen without any hydronephrosis.     DVT prophylaxis: Eliquis Code Status:   Code Status: DNR Family Communication: Husband at bedside Disposition Plan: Discharge home with hospice once home plan is solidified. Patient is a 2+ assist and at this time, there is no safe plan for home  care.   Consultants:  Oncology General surgery Gastroenterology Palliative care Interventional  radiology  Procedures:  Gastrojejunostomy tube placement (01/13/2022)  Antimicrobials: Ceftriaxone Vancomycin Cefepime Flagyl   Subjective: No concerns today. Pain is present but controlled.  Objective: BP (!) 157/91 (BP Location: Left Arm)    Pulse 97    Temp 98 F (36.7 C) (Oral)    Resp 14    Ht 5\' 8"  (1.727 m)    Wt (!) 161.4 kg    SpO2 99%    BMI 54.10 kg/m   Examination:  General exam: Appears calm and comfortable Respiratory system: Clear to auscultation. Respiratory effort normal. Cardiovascular system: S1 & S2 heard, RRR. No murmurs, rubs, gallops or clicks. Gastrointestinal system: Abdomen is nondistended, soft and mildly tender. No organomegaly or masses felt. Normal bowel sounds heard. Central nervous system: Alert and oriented. No focal neurological deficits. Musculoskeletal: No calf tenderness Skin: No cyanosis. No rashes Psychiatry: Judgement and insight appear normal. Mood & affect appropriate.    Data Reviewed: I have personally reviewed following labs and imaging studies  CBC Lab Results  Component Value Date   WBC 11.7 (H) 01/22/2022   RBC 2.71 (L) 01/22/2022   HGB 8.0 (L) 01/22/2022   HCT 25.7 (L) 01/22/2022   MCV 94.8 01/22/2022   MCH 29.5 01/22/2022   PLT 298 01/22/2022   MCHC 31.1 01/22/2022   RDW 24.2 (H) 01/22/2022   LYMPHSABS 2.0 01/14/2022   MONOABS 1.2 (H) 01/14/2022   EOSABS 0.4 01/14/2022   BASOSABS 0.0 45/99/7741     Last metabolic panel Lab Results  Component Value Date   NA 138 01/17/2022   K 3.8 01/17/2022   CL 107 01/17/2022   CO2 25 01/17/2022   BUN 10 01/17/2022   CREATININE 0.66 01/17/2022   GLUCOSE 212 (H) 01/17/2022   GFRNONAA >60 01/17/2022   GFRAA 49 (L) 12/22/2019   CALCIUM 7.3 (L) 01/17/2022   PHOS 2.5 01/20/2022   PROT 5.4 (L) 01/08/2022   ALBUMIN 1.9 (L) 01/08/2022   LABGLOB 3.4 12/22/2019   AGRATIO 1.1 (L) 12/22/2019   BILITOT 0.9 01/08/2022   ALKPHOS 57 01/08/2022   AST 14 (L) 01/08/2022   ALT 10  01/08/2022   ANIONGAP 6 01/17/2022    GFR: Estimated Creatinine Clearance: 123 mL/min (by C-G formula based on SCr of 0.66 mg/dL).  No results found for this or any previous visit (from the past 240 hour(s)).    Radiology Studies: No results found.    LOS: 15 days    Cordelia Poche, MD [facility] 01/22/2022, 4:44 PM   If 7PM-7AM, please contact night-coverage [email or web address]

## 2022-01-23 DIAGNOSIS — K311 Adult hypertrophic pyloric stenosis: Secondary | ICD-10-CM | POA: Diagnosis not present

## 2022-01-23 DIAGNOSIS — N179 Acute kidney failure, unspecified: Secondary | ICD-10-CM | POA: Diagnosis not present

## 2022-01-23 DIAGNOSIS — R531 Weakness: Secondary | ICD-10-CM | POA: Diagnosis not present

## 2022-01-23 LAB — GLUCOSE, CAPILLARY
Glucose-Capillary: 126 mg/dL — ABNORMAL HIGH (ref 70–99)
Glucose-Capillary: 214 mg/dL — ABNORMAL HIGH (ref 70–99)
Glucose-Capillary: 215 mg/dL — ABNORMAL HIGH (ref 70–99)
Glucose-Capillary: 248 mg/dL — ABNORMAL HIGH (ref 70–99)
Glucose-Capillary: 303 mg/dL — ABNORMAL HIGH (ref 70–99)
Glucose-Capillary: 343 mg/dL — ABNORMAL HIGH (ref 70–99)

## 2022-01-23 LAB — CBC
HCT: 23.7 % — ABNORMAL LOW (ref 36.0–46.0)
Hemoglobin: 7.4 g/dL — ABNORMAL LOW (ref 12.0–15.0)
MCH: 29.4 pg (ref 26.0–34.0)
MCHC: 31.2 g/dL (ref 30.0–36.0)
MCV: 94 fL (ref 80.0–100.0)
Platelets: 260 10*3/uL (ref 150–400)
RBC: 2.52 MIL/uL — ABNORMAL LOW (ref 3.87–5.11)
RDW: 24.7 % — ABNORMAL HIGH (ref 11.5–15.5)
WBC: 10.9 10*3/uL — ABNORMAL HIGH (ref 4.0–10.5)
nRBC: 0 % (ref 0.0–0.2)

## 2022-01-23 MED ORDER — INSULIN PEN NEEDLE 31G X 5 MM MISC: 3 refills | Status: AC

## 2022-01-23 MED ORDER — POLYETHYLENE GLYCOL 3350 17 G PO PACK
17.0000 g | PACK | Freq: Every day | ORAL | Status: DC
  Filled 2022-01-23 (×3): qty 1

## 2022-01-23 MED ORDER — BASAGLAR KWIKPEN 100 UNIT/ML ~~LOC~~ SOPN: 30.0000 [IU] | PEN_INJECTOR | Freq: Every day | SUBCUTANEOUS | 0 refills | Status: DC

## 2022-01-23 MED ORDER — INSULIN DEGLUDEC 100 UNIT/ML ~~LOC~~ SOPN: 10.0000 [IU] | PEN_INJECTOR | Freq: Every day | SUBCUTANEOUS | 0 refills | Status: AC

## 2022-01-23 MED ORDER — INSULIN GLARGINE-YFGN 100 UNIT/ML ~~LOC~~ SOLN
10.0000 [IU] | Freq: Every day | SUBCUTANEOUS | Status: DC
  Administered 2022-01-23 – 2022-01-26 (×4): 10 [IU] via SUBCUTANEOUS
  Filled 2022-01-23 (×4): qty 0.1

## 2022-01-23 NOTE — TOC Transition Note (Addendum)
Transition of Care [facility]) - CM/SW Discharge Note   Patient Details  Name: Leslie Mullen MRN: [id number] Date of Birth: 1961/03/19  Transition of [facility]) CM/SW Contact:  Dessa Phi, RN Phone Number: 01/23/2022, 2:18 PM   Clinical Narrative: See [facility] note-Noted d/c order. [facility] delivery of hoyer lift into home. All other dme has been delivered-[facility] able to accept-caregiver service has an eval date.Awaiting from Nsg when appropriate to call PTAR-forms @ nsg station.  -noted per [facility] rep Cheri-hoyer lift to be delivered tomorrow.    Final next level of care: Home w Hospice Care Barriers to Discharge: No Barriers Identified   Patient Goals and CMS Choice Patient states their goals for this hospitalization and ongoing recovery are:: home w/hospice CMS Medicare.gov Compare Post Acute Care list provided to:: Patient Represenative (must comment) Choice offered to / list presented to : Patient, Adult Children  Discharge Placement                Patient to be transferred to facility by: [place] Name of family member notified: [facility] Patient and family notified of of transfer: 01/20/22  Discharge Plan and Services     Post Acute Care Choice: NA                      [place] Agency: [place] ([place]) Interventions     Readmission Risk Interventions Readmission Risk Prevention Plan 01/08/2022  Transportation Screening Complete  Medication Review Press photographer) Complete  PCP or Specialist appointment within 3-5 days of discharge Complete  HRI or [place] Complete  SW Recovery Care/Counseling Consult Complete  [place] Not Applicable  Some recent data might be hidden

## 2022-01-23 NOTE — Progress Notes (Signed)
End of shift  Pt used BSC for both bladder and bowel elimination today.  Pt had multiple Bms.  Pt requested pain medicine multiple times today and nausea medication one time.   Per pt, she will be discharging home on Tuesday with hospice which doesn't match the other notes in her chart.

## 2022-01-23 NOTE — Progress Notes (Signed)
Inpatient Diabetes Program Recommendations  AACE/ADA: New Consensus Statement on Inpatient Glycemic Control (2015)  Target Ranges:  Prepandial:   less than 140 mg/dL      Peak postprandial:   less than 180 mg/dL (1-2 hours)      Critically ill patients:  140 - 180 mg/dL   Lab Results  Component Value Date   GLUCAP 303 (H) 01/23/2022   HGBA1C 6.7 (H) 01/07/2022    Review of Glycemic Control  Latest Reference Range & Units 01/22/22 20:23 01/23/22 00:32 01/23/22 03:13 01/23/22 07:41 01/23/22 11:42  Glucose-Capillary 70 - 99 mg/dL 176 (H) 214 (H) 248 (H) 343 (H) 303 (H)   Diabetes history: DM 2 Outpatient Diabetes medications:  Syngardy XR 12.04/999 mg bid Current orders for Inpatient glycemic control:  Novolog resistant tid with meals and HS  Inpatient Diabetes Program Recommendations:    Note that patient has been on [place] in the past.  Insulin was stopped previously due to lows.   Looks like she could use low dose basal insulin again based on blood sugars levels.  Please consider adding Semglee 10 units daily in the hospital. (At home consider resuming Tresiba 10 units daily).  Discussed with patient and her daughter.  They state that they know how to administer insulin and check blood sugars as well.  Thanks,  Adah Perl, RN, BC-ADM Inpatient Diabetes Coordinator Pager [phone]  (8a-5p)

## 2022-01-23 NOTE — Progress Notes (Addendum)
° °  I have followed up with the daughter regarding d/c plans and the hired caregiver search. She has heard back from [facility] and they will be doing there assessment for hired caregivers in the home on Tuesday at 100pm.   All equipment needs are in home as well.   We will continue to follow and assist with d/c planning. I have updated Kathy in [facility] of this as well.  I have called daughter back to update that hospital d/c plan is for Monday to allow the pt to be in home for evaluation on Tuesday. The daughter responded "No, this is just a consultation and they do not even know if the pt will be accepted as a pt". She would rather wait until she knows for sure that there is a start date in place.  I have updated TOC team Juliann Pulse.   1215pm TC to the daughter to offer additional equipment of hoyer lift The daughter is in agreement with this. I have ordered this to be delivered to the home STAT today through [facility]. I have updated the CM and MD that this has been ordered.   345pm [facility] called to update that equipment was offered to be delivered today and family request delivery tomorrow at [place] RN [phone]

## 2022-01-23 NOTE — Progress Notes (Signed)
PROGRESS NOTE    Leslie Mullen  POE:[id number] DOB: Sep 27, 1961 DOA: 01/07/2022 PCP: Sue Lush, PA-C   Brief Narrative: Patient is a 61 years old female with PMH significant for CHB, s/p PPM, DM 2, hypertension, metastatic pancreatic cancer, endometrial cancer, morbid obesity presented in the ED with complaints of nausea and vomiting.  She was recently hospitalized from 1/26 - 1/30 for sepsis secondary to port infection.  At that time she was sent home with 5 days of oral doxycycline.  Blood cultures were negative.  Patient has acutely developed nausea and vomiting,  She was unable to take anything down,  she has tried Compazine and antacids did not help.  At this time, patient was admitted for sepsis secondary to pancreatic infection versus left lower lobe pneumonia. She is also found to have gastric outlet obstruction, general surgery, gastroenterology, oncology, palliative care consulted.  She underwent EGD with stent, nausea has improved.  Now she has recurrence of nausea and abdominal pain.  Dr. Burr Medico had long discussion with the patient, and underwent G- J-tube placement for venting of the stomach and post duodenal stent feeding by IR on 01/14/22.  Patient has been initiated on tube feedings at this time. Patient has been considered for home hospice at this time with tube feedings.   Assessment and Plan: * Gastric outlet obstruction- (present on admission) Secondary to metastatic cancer.  Status post gastric jejunostomy tube with stent tube feeding.  Patient has been initiated on tube feeding.  On full liquids as well.  Has been tolerating well.  Cancer related pain Palliative care on board.  On fentanyl patch and Dilaudid through the tube.  Patient will be on hospice care on discharge.  General weakness Debility and deconditioning due to advanced cancer.  Continue supportive care.  Patient will be on hospice at home  Goals of care, counseling/discussion Decision made to discharge home  with hospice.  Pancreatic cancer metastasized to liver [facility])- (present on admission) Patient with metastatic pancreatic cancer followed by Dr. Burr Medico as outpatient for palliative chemotherapy.  Presenting with gastric outlet obstruction secondary to pancreatic head lesion.  Was on NG tube during hospitalization for decompression.  Patient does have poor prognosis and poor tolerance to chemotherapy and is not a candidate for further cytotoxic treatment. Due to patient wishes for tube feeding, underwent gastrojejunostomy tube placement by IR on 01/24/2022.  Tube feedings have been initiated.  Plan for discharge home with hospice.  Paroxysmal atrial fibrillation ([place])- (present on admission) Heart rate is controlled.  On Eliquis for anticoagulation. -Continue Eliquis  Type 2 diabetes mellitus with diabetic polyneuropathy, with long-term current use of insulin ([place]) Patient is on Synjardy as an outpatient which was held while inpatient. -Continue SSI -Start Semglee 10 unit daily  Hypomagnesemia-resolved as of 01/22/2022 Repleted. Resolved.  Hypophosphatemia-resolved as of 01/22/2022 Replenished. Resolve.d  Sepsis (HCC)-resolved as of 01/22/2022, (present on admission) Thought to be secondary to pancreatic infection versus left lower lobe pneumonia.  Has completed cefepime and Flagyl for 5 days. Procalcitonin 0.67>0.26.  Blood cultures no growth. Sepsis physiology has resolved.   AKI (acute kidney injury) (HCC)-resolved as of 01/22/2022, (present on admission) Resolved.  Thought to be secondary to volume depletion.  CT scan of the abdomen without any hydronephrosis.     DVT prophylaxis: Eliquis Code Status:   Code Status: DNR Family Communication: Daughter on telephone Disposition Plan: Discharge home with hospice once home plan is solidified. Patient is a 2+ assist and at this time. Plan for  safe discharge once hoyer lift is delivered with planned delivery set for 2/18. Discharge home with  hospice tomorrow.   Consultants:  Oncology General surgery Gastroenterology Palliative care Interventional radiology  Procedures:  Gastrojejunostomy tube placement (01/13/2022)  Antimicrobials: Ceftriaxone Vancomycin Cefepime Flagyl   Subjective: No issues this morning. Patient with multiple bowel movements.  Objective: BP 127/64 (BP Location: Left Arm)    Pulse 95    Temp 98.1 F (36.7 C) (Oral)    Resp 19    Ht 5\' 8"  (1.727 m)    Wt (!) 164.9 kg    SpO2 100%    BMI 55.28 kg/m   Examination:  General exam: Appears calm and comfortable Respiratory system: Clear to auscultation. Respiratory effort normal. Cardiovascular system: S1 & S2 heard, RRR. Gastrointestinal system: Abdomen is nondistended, soft and tender. Normal bowel sounds heard. Central nervous system: Alert and oriented. No focal neurological deficits. Musculoskeletal:  No calf tenderness Skin: No cyanosis. No rashes Psychiatry: Judgement and insight appear normal. Mood & affect appropriate.    Data Reviewed: I have personally reviewed following labs and imaging studies  CBC Lab Results  Component Value Date   WBC 10.9 (H) 01/23/2022   RBC 2.52 (L) 01/23/2022   HGB 7.4 (L) 01/23/2022   HCT 23.7 (L) 01/23/2022   MCV 94.0 01/23/2022   MCH 29.4 01/23/2022   PLT 260 01/23/2022   MCHC 31.2 01/23/2022   RDW 24.7 (H) 01/23/2022   LYMPHSABS 2.0 01/14/2022   MONOABS 1.2 (H) 01/14/2022   EOSABS 0.4 01/14/2022   BASOSABS 0.0 24/46/2863     Last metabolic panel Lab Results  Component Value Date   NA 138 01/17/2022   K 3.8 01/17/2022   CL 107 01/17/2022   CO2 25 01/17/2022   BUN 10 01/17/2022   CREATININE 0.66 01/17/2022   GLUCOSE 212 (H) 01/17/2022   GFRNONAA >60 01/17/2022   GFRAA 49 (L) 12/22/2019   CALCIUM 7.3 (L) 01/17/2022   PHOS 2.5 01/20/2022   PROT 5.4 (L) 01/08/2022   ALBUMIN 1.9 (L) 01/08/2022   LABGLOB 3.4 12/22/2019   AGRATIO 1.1 (L) 12/22/2019   BILITOT 0.9 01/08/2022   ALKPHOS  57 01/08/2022   AST 14 (L) 01/08/2022   ALT 10 01/08/2022   ANIONGAP 6 01/17/2022    GFR: Estimated Creatinine Clearance: 124.7 mL/min (by C-G formula based on SCr of 0.66 mg/dL).  No results found for this or any previous visit (from the past 240 hour(s)).    Radiology Studies: No results found.    LOS: 16 days    Cordelia Poche, MD [facility] 01/23/2022, 3:54 PM   If 7PM-7AM, please contact night-coverage [email or web address]

## 2022-01-23 NOTE — Discharge Planning (Signed)
Oncology Discharge Planning Note  [facility] Address: [address], [place], [place] [address] Hours of Operation:  Nena Polio, Monday - Friday  Clinic Contact Information:  [phone]) [phone]  Oncology Care Team: Medical Oncologist:  Dr. Truitt Merle  Patient Details: Name:  Leslie Mullen, Leslie Mullen MRN:   [id number] DOB:   Apr 11, 1961 Reason for Current Admission: Gastric outlet obstruction  Discharge Planning Narrative: Upon discharge from the hospital, hematology/oncology's post discharge plan of care for the outpatient setting is: Per CM/SW, patient will be discharged to home with Hospice services.   [name] will be called within two business days after discharge to endure that hospice has assessed and care in place.  Outpatient Oncology Specific Care Only: Oncology appointment transportation needs addressed?:  not applicable Oncology medication management for symptom management addressed?:  not applicable Chemo Alert Card reviewed?:  not applicable Immunotherapy Alert Card reviewed?:  not applicable

## 2022-01-24 DIAGNOSIS — N179 Acute kidney failure, unspecified: Secondary | ICD-10-CM | POA: Diagnosis not present

## 2022-01-24 DIAGNOSIS — R531 Weakness: Secondary | ICD-10-CM | POA: Diagnosis not present

## 2022-01-24 DIAGNOSIS — K311 Adult hypertrophic pyloric stenosis: Secondary | ICD-10-CM | POA: Diagnosis not present

## 2022-01-24 LAB — CBC
HCT: 26.4 % — ABNORMAL LOW (ref 36.0–46.0)
Hemoglobin: 8.3 g/dL — ABNORMAL LOW (ref 12.0–15.0)
MCH: 29.9 pg (ref 26.0–34.0)
MCHC: 31.4 g/dL (ref 30.0–36.0)
MCV: 95 fL (ref 80.0–100.0)
Platelets: 253 10*3/uL (ref 150–400)
RBC: 2.78 MIL/uL — ABNORMAL LOW (ref 3.87–5.11)
RDW: 24.2 % — ABNORMAL HIGH (ref 11.5–15.5)
WBC: 13.5 10*3/uL — ABNORMAL HIGH (ref 4.0–10.5)
nRBC: 0 % (ref 0.0–0.2)

## 2022-01-24 LAB — GLUCOSE, CAPILLARY
Glucose-Capillary: 130 mg/dL — ABNORMAL HIGH (ref 70–99)
Glucose-Capillary: 153 mg/dL — ABNORMAL HIGH (ref 70–99)
Glucose-Capillary: 154 mg/dL — ABNORMAL HIGH (ref 70–99)
Glucose-Capillary: 181 mg/dL — ABNORMAL HIGH (ref 70–99)
Glucose-Capillary: 192 mg/dL — ABNORMAL HIGH (ref 70–99)
Glucose-Capillary: 97 mg/dL (ref 70–99)

## 2022-01-24 NOTE — Progress Notes (Signed)
2235 Pt was assisted to bedside commode with 2 assist. When pt was finished pt was being transferred from [facility] with 2 assist to bed when pt knee buckled and was gently lowered to the floor by Mendel Ryder and myself. Mendel Ryder CNA and I called charge Human resources officer into room and Barrister's clerk for assistance. With use of hoyer lift patient was safely lifted and placed back to bed without any C/O or injury. Pt assessed, VS stable No C/O or injury. MD notified and charge RN aware. Will continue to monitor. NADN

## 2022-01-24 NOTE — Progress Notes (Signed)
PROGRESS NOTE    Leslie Mullen  DXA:[id number] DOB: May 13, 1961 DOA: 01/07/2022 PCP: Sue Lush, PA-C   Brief Narrative: Patient is a 61 years old female with PMH significant for CHB, s/p PPM, DM 2, hypertension, metastatic pancreatic cancer, endometrial cancer, morbid obesity presented in the ED with complaints of nausea and vomiting.  She was recently hospitalized from 1/26 - 1/30 for sepsis secondary to port infection.  At that time she was sent home with 5 days of oral doxycycline.  Blood cultures were negative.  Patient has acutely developed nausea and vomiting,  She was unable to take anything down,  she has tried Compazine and antacids did not help.  At this time, patient was admitted for sepsis secondary to pancreatic infection versus left lower lobe pneumonia. She is also found to have gastric outlet obstruction, general surgery, gastroenterology, oncology, palliative care consulted.  She underwent EGD with stent, nausea has improved.  Now she has recurrence of nausea and abdominal pain.  Dr. Burr Medico had long discussion with the patient, and underwent G- J-tube placement for venting of the stomach and post duodenal stent feeding by IR on 01/14/22.  Patient has been initiated on tube feedings at this time. Patient has been considered for home hospice at this time with tube feedings.   Assessment and Plan: * Gastric outlet obstruction- (present on admission) Secondary to metastatic cancer.  Status post gastric jejunostomy tube with stent tube feeding.  Patient has been initiated on tube feeding.  On full liquids as well.  Has been tolerating well.  Cancer related pain Palliative care on board.  On fentanyl patch and Dilaudid through the tube.  Patient will be on hospice care on discharge.  General weakness Debility and deconditioning due to advanced cancer.  Continue supportive care.  Patient will be on hospice at home  Goals of care, counseling/discussion Decision made to discharge home  with hospice.  Pancreatic cancer metastasized to liver [facility])- (present on admission) Patient with metastatic pancreatic cancer followed by Dr. Burr Medico as outpatient for palliative chemotherapy.  Presenting with gastric outlet obstruction secondary to pancreatic head lesion.  Was on NG tube during hospitalization for decompression.  Patient does have poor prognosis and poor tolerance to chemotherapy and is not a candidate for further cytotoxic treatment. Due to patient wishes for tube feeding, underwent gastrojejunostomy tube placement by IR on 01/24/2022.  Tube feedings have been initiated.  Plan for discharge home with hospice.  Paroxysmal atrial fibrillation ([place])- (present on admission) Heart rate is controlled.  On Eliquis for anticoagulation. -Continue Eliquis  Type 2 diabetes mellitus with diabetic polyneuropathy, with long-term current use of insulin ([place]) Patient is on Synjardy as an outpatient which was held while inpatient. -Continue SSI -Continue Semglee 10 unit daily  Hypomagnesemia-resolved as of 01/22/2022 Repleted. Resolved.  Hypophosphatemia-resolved as of 01/22/2022 Replenished. Resolved.  Sepsis (HCC)-resolved as of 01/22/2022, (present on admission) Thought to be secondary to pancreatic infection versus left lower lobe pneumonia.  Has completed cefepime and Flagyl for 5 days. Procalcitonin 0.67>0.26.  Blood cultures no growth. Sepsis physiology has resolved.   AKI (acute kidney injury) (HCC)-resolved as of 01/22/2022, (present on admission) Resolved.  Thought to be secondary to volume depletion.  CT scan of the abdomen without any hydronephrosis.     DVT prophylaxis: Eliquis Code Status:   Code Status: DNR Family Communication: Daughter on telephone Disposition Plan: Discharge home with hospice once home plan is solidified. Patient is a 2+ assist and at this time. Plan for  safe discharge once hoyer lift is delivered with planned delivery set for 2/20   Consultants:   Oncology General surgery Gastroenterology Palliative care Interventional radiology  Procedures:  Gastrojejunostomy tube placement (01/13/2022)  Antimicrobials: Ceftriaxone Vancomycin Cefepime Flagyl   Subjective: No new issues. Frustrated with discharge planning  Objective: BP (!) 118/54 (BP Location: Left Arm)    Pulse 89    Temp 98.4 F (36.9 C) (Oral)    Resp 18    Ht 5\' 8"  (1.727 m)    Wt (!) 164.9 kg    SpO2 100%    BMI 55.28 kg/m   Examination:  General: No distress Respiratory: Unlabored work of breathing   Data Reviewed: I have personally reviewed following labs and imaging studies  CBC Lab Results  Component Value Date   WBC 10.9 (H) 01/23/2022   RBC 2.52 (L) 01/23/2022   HGB 7.4 (L) 01/23/2022   HCT 23.7 (L) 01/23/2022   MCV 94.0 01/23/2022   MCH 29.4 01/23/2022   PLT 260 01/23/2022   MCHC 31.2 01/23/2022   RDW 24.7 (H) 01/23/2022   LYMPHSABS 2.0 01/14/2022   MONOABS 1.2 (H) 01/14/2022   EOSABS 0.4 01/14/2022   BASOSABS 0.0 13/24/4010     Last metabolic panel Lab Results  Component Value Date   NA 138 01/17/2022   K 3.8 01/17/2022   CL 107 01/17/2022   CO2 25 01/17/2022   BUN 10 01/17/2022   CREATININE 0.66 01/17/2022   GLUCOSE 212 (H) 01/17/2022   GFRNONAA >60 01/17/2022   GFRAA 49 (L) 12/22/2019   CALCIUM 7.3 (L) 01/17/2022   PHOS 2.5 01/20/2022   PROT 5.4 (L) 01/08/2022   ALBUMIN 1.9 (L) 01/08/2022   LABGLOB 3.4 12/22/2019   AGRATIO 1.1 (L) 12/22/2019   BILITOT 0.9 01/08/2022   ALKPHOS 57 01/08/2022   AST 14 (L) 01/08/2022   ALT 10 01/08/2022   ANIONGAP 6 01/17/2022    GFR: Estimated Creatinine Clearance: 124.7 mL/min (by C-G formula based on SCr of 0.66 mg/dL).  No results found for this or any previous visit (from the past 240 hour(s)).    Radiology Studies: No results found.    LOS: 17 days    Cordelia Poche, MD [facility] 01/24/2022, 10:35 AM   If 7PM-7AM, please contact  night-coverage [email or web address]

## 2022-01-24 NOTE — TOC Progression Note (Addendum)
Transition of Care [facility]) - Progression Note    Patient Details  Name: Leslie Mullen MRN: [id number] Date of Birth: November 14, 1961  [facility] CM/SW Contact  Leslie Mullen, [place] Phone Number: 01/24/2022, 2:00 PM  Clinical Narrative:     CSW spoke to patient's daughter Leslie Mullen who is expecting the hoyer lift to be delivered today and plan for discharge on Monday.  CSW was also informed by patient's daughter that patient's husband is taking off on Monday to help take care of her, and they hired caregivers from [place] who are supposed to start on Tuesday.  CSW discussed when patient will be discharging, the plan is to discharge on Monday.  CSW was informed that patient will need attends and a bedpan, CSW notified [place] who will be following patient for home with hospice services.  Patient's daughter was informed that patient will be discharging on Monday.  Daughter expressed understanding and confirmed that patient is discharging on Monday.  Patient will need EMS transport home per daughter.  CSW updated attending physician.  CSW to continue to follow patient's progress throughout discharge planning.   Expected Discharge Plan: Home w Hospice Care Barriers to Discharge: No Barriers Identified  Expected Discharge Plan and Services Expected Discharge Plan: [place] Acute Care Choice: NA Living arrangements for the past 2 months: Single Family Home Expected Discharge Date: 01/24/22                           [facility] Agency: [place]         Social Determinants of Health (SDOH) Interventions    Readmission Risk Interventions Readmission Risk Prevention Plan 01/08/2022  Transportation Screening Complete  Medication Review Press photographer) Complete  PCP or Specialist appointment within 3-5 days of discharge Complete  HRI or [place] Complete  SW Recovery Care/Counseling Consult Complete  [address] Not Applicable  Some recent data might be hidden

## 2022-01-25 LAB — GLUCOSE, CAPILLARY
Glucose-Capillary: 112 mg/dL — ABNORMAL HIGH (ref 70–99)
Glucose-Capillary: 140 mg/dL — ABNORMAL HIGH (ref 70–99)
Glucose-Capillary: 156 mg/dL — ABNORMAL HIGH (ref 70–99)
Glucose-Capillary: 167 mg/dL — ABNORMAL HIGH (ref 70–99)
Glucose-Capillary: 188 mg/dL — ABNORMAL HIGH (ref 70–99)
Glucose-Capillary: 237 mg/dL — ABNORMAL HIGH (ref 70–99)

## 2022-01-25 NOTE — Progress Notes (Signed)
Progress Note   Patient: Leslie Mullen HER:[id number] DOB: 10-Jan-1961 DOA: 01/07/2022     18 DOS: the patient was seen and examined on 01/25/2022   Brief hospital course: Patient is a 61 years old female with PMH significant for CHB, s/p PPM, DM 2, hypertension, metastatic pancreatic cancer, endometrial cancer, morbid obesity presented in the ED with complaints of nausea and vomiting.  She was recently hospitalized from 1/26 - 1/30 for sepsis secondary to port infection.  At that time she was sent home with 5 days of oral doxycycline.  Blood cultures were negative.  Patient has acutely developed nausea and vomiting,  She was unable to take anything down,  she has tried Compazine and antacids did not help.  At this time, patient was admitted for sepsis secondary to pancreatic infection versus left lower lobe pneumonia. She is also found to have gastric outlet obstruction, general surgery, gastroenterology, oncology, palliative care consulted.  She underwent EGD with stent, nausea has improved.  Now she has recurrence of nausea and abdominal pain.  Dr. Burr Medico had long discussion with the patient, and underwent G- J-tube placement for venting of the stomach and post duodenal stent feeding by IR on 01/14/22.  Patient has been initiated on tube feedings at this time. Patient has been considered for home hospice at this time with tube feedings.   Pt seen and examined at bedside. No new complaints. Waiting for equipment to be delivered for discharge.   Assessment and Plan: * Gastric outlet obstruction- (present on admission) Secondary to metastatic cancer.  Status post gastric jejunostomy tube with stent tube feeding.  Patient has been initiated on tube feeding.  On full liquids as well.  Has been tolerating well. No new complaints.   Cancer related pain Palliative care on board.  On fentanyl patch and Dilaudid through the tube.  Patient will be on hospice care on discharge. Pain controlled.   General  weakness Debility and deconditioning due to advanced cancer.  Continue supportive care.  Patient will be on hospice at home  Goals of care, counseling/discussion Decision made to discharge home with hospice.  Pancreatic cancer metastasized to liver [facility])- (present on admission) Patient with metastatic pancreatic cancer followed by Dr. Burr Medico as outpatient for palliative chemotherapy.  Presenting with gastric outlet obstruction secondary to pancreatic head lesion.  Was on NG tube during hospitalization for decompression.  Patient does have poor prognosis and poor tolerance to chemotherapy and is not a candidate for further cytotoxic treatment. Due to patient wishes for tube feeding, underwent gastrojejunostomy tube placement by IR on 01/24/2022.  Tube feedings have been initiated.  Plan for discharge home with hospice.  Paroxysmal atrial fibrillation ([place])- (present on admission) Heart rate is controlled.  On Eliquis for anticoagulation. -Continue Eliquis  Type 2 diabetes mellitus with diabetic polyneuropathy, with long-term current use of insulin ([place]) Patient is on Synjardy as an outpatient which was held while inpatient. -Continue SSI -Continue Semglee 10 unit daily  Hypomagnesemia-resolved as of 01/22/2022 Repleted. Resolved.  Hypophosphatemia-resolved as of 01/22/2022 Replenished. Resolved.  Sepsis (HCC)-resolved as of 01/22/2022, (present on admission) Thought to be secondary to pancreatic infection versus left lower lobe pneumonia.  Has completed cefepime and Flagyl for 5 days. Procalcitonin 0.67>0.26.  Blood cultures no growth. Sepsis physiology has resolved.   AKI (acute kidney injury) (HCC)-resolved as of 01/22/2022, (present on admission) Resolved.  Thought to be secondary to volume depletion.  CT scan of the abdomen without any hydronephrosis.  No new labs today.  Subjective: no new complaints.   Physical Exam: Vitals:   01/24/22 1547 01/24/22 2017 01/24/22 2231  01/25/22 0432  BP: (!) 164/77 139/76 (!) 144/79 122/75  Pulse: 99 97 (!) 101 87  Resp: 18 15  20   Temp: 98.5 F (36.9 C) 98.4 F (36.9 C)  98.6 F (37 C)  TempSrc: Oral Oral  Oral  SpO2: 100% 97% 100% 97%  Weight:    (!) 161.7 kg  Height:       General exam: Appears calm and comfortable  Respiratory system: Clear to auscultation. Respiratory effort normal. Cardiovascular system: S1 & S2 heard, RRR.  Gastrointestinal system: Abdomen is nondistended, soft and nontender.  Central nervous system: Alert and oriented. No focal neurological deficits. Extremities: able to move all extremities.  Skin: No rashes, Psychiatry: Mood & affect appropriate.    Data Reviewed: Results for orders placed or performed during the hospital encounter of 01/07/22 (from the past 24 hour(s))  Glucose, capillary     Status: Abnormal   Collection Time: 01/24/22  4:38 PM  Result Value Ref Range   Glucose-Capillary 130 (H) 70 - 99 mg/dL  Glucose, capillary     Status: None   Collection Time: 01/24/22  8:14 PM  Result Value Ref Range   Glucose-Capillary 97 70 - 99 mg/dL  Glucose, capillary     Status: Abnormal   Collection Time: 01/25/22 12:12 AM  Result Value Ref Range   Glucose-Capillary 112 (H) 70 - 99 mg/dL  Glucose, capillary     Status: Abnormal   Collection Time: 01/25/22  4:31 AM  Result Value Ref Range   Glucose-Capillary 156 (H) 70 - 99 mg/dL  Glucose, capillary     Status: Abnormal   Collection Time: 01/25/22  7:29 AM  Result Value Ref Range   Glucose-Capillary 188 (H) 70 - 99 mg/dL  Glucose, capillary     Status: Abnormal   Collection Time: 01/25/22 11:22 AM  Result Value Ref Range   Glucose-Capillary 237 (H) 70 - 99 mg/dL     Family Communication: none at bedside.   Disposition: Status is: Inpatient Remains inpatient appropriate because: unsafe d/c plan.           Planned Discharge Destination:  Home with hospice.      Time spent: 28 minutes  Author: Hosie Poisson,  MD 01/25/2022 9:56 AM  For on call review [email or web address].

## 2022-01-25 NOTE — Plan of Care (Signed)
°  Problem: Elimination: Goal: Will not experience complications related to bowel motility Outcome: Progressing Goal: Will not experience complications related to urinary retention Outcome: Progressing   Problem: Pain Managment: Goal: General experience of comfort will improve Outcome: Progressing   Problem: Activity: Goal: Risk for activity intolerance will decrease Outcome: Not Progressing   Problem: Safety: Goal: Ability to remain free from injury will improve Outcome: Not Progressing

## 2022-01-26 ENCOUNTER — Inpatient Hospital Stay: Payer: 59 | Admitting: Hematology

## 2022-01-26 ENCOUNTER — Inpatient Hospital Stay: Payer: 59

## 2022-01-26 DIAGNOSIS — R531 Weakness: Secondary | ICD-10-CM | POA: Diagnosis not present

## 2022-01-26 DIAGNOSIS — K311 Adult hypertrophic pyloric stenosis: Secondary | ICD-10-CM | POA: Diagnosis not present

## 2022-01-26 DIAGNOSIS — N179 Acute kidney failure, unspecified: Secondary | ICD-10-CM | POA: Diagnosis not present

## 2022-01-26 LAB — GLUCOSE, CAPILLARY
Glucose-Capillary: 139 mg/dL — ABNORMAL HIGH (ref 70–99)
Glucose-Capillary: 147 mg/dL — ABNORMAL HIGH (ref 70–99)
Glucose-Capillary: 161 mg/dL — ABNORMAL HIGH (ref 70–99)
Glucose-Capillary: 163 mg/dL — ABNORMAL HIGH (ref 70–99)
Glucose-Capillary: 180 mg/dL — ABNORMAL HIGH (ref 70–99)

## 2022-01-26 MED ORDER — KATE FARMS STANDARD 1.4 PO LIQD: 1560.0000 mL | ORAL | Status: AC

## 2022-01-26 NOTE — Progress Notes (Signed)
Patient requesting that tube feed be turned off for a little while.  This was done per her request.  Residual checked at beginning of the shift, which was less than 30 mls.

## 2022-01-26 NOTE — Discharge Planning (Signed)
Oncology Discharge Planning Note  [facility] Address: [address], [place], [place] [address] Hours of Operation:  Nena Polio, Monday - Friday  Clinic Contact Information:  [phone]) [phone]  Oncology Care Team: Medical Oncologist:  Dr. Truitt Merle  Patient Details: Name:  Leslie Mullen, Leslie Mullen MRN:   [id number] DOB:   1961/11/30 Reason for Current Admission: Gastric outlet obstruction  Discharge Planning Narrative: Discharge plan noted to be [place].  Will follow up with patient or family within 24-48 hours to ensure they have received the care they need.  Outpatient Oncology Specific Care Only: Oncology appointment transportation needs addressed?:  not applicable Oncology medication management for symptom management addressed?:  not applicable Chemo Alert Card reviewed?:  not applicable Immunotherapy Alert Card reviewed?:  not applicable

## 2022-01-26 NOTE — Discharge Summary (Addendum)
Physician Discharge Summary   Patient: Leslie Mullen MRN: [id number] DOB: 11-09-1961  Admit date:     01/07/2022  Discharge date: 01/26/22  Discharge Physician: Cordelia Poche, MD   PCP: Sue Lush, PA-C   Recommendations at discharge:   Home with hospice  Discharge Diagnoses: Principal Problem:   Gastric outlet obstruction Active Problems:   Type 2 diabetes mellitus with diabetic polyneuropathy, with long-term current use of insulin (HCC)   Paroxysmal atrial fibrillation (HCC)   Pancreatic cancer metastasized to liver [facility])   Palliative care by specialist   Goals of care, counseling/discussion   General weakness   Cancer related pain  Resolved Problems:   AKI (acute kidney injury) ([place])   Sepsis ([place])   Hypophosphatemia   Hypomagnesemia   Hospital Course: Patient is a 61 years old female with PMH significant for CHB, s/p PPM, DM 2, hypertension, metastatic pancreatic cancer, endometrial cancer, morbid obesity presented in the ED with complaints of nausea and vomiting.  She was recently hospitalized from 1/26 - 1/30 for sepsis secondary to port infection.  At that time she was sent home with 5 days of oral doxycycline.  Blood cultures were negative.  Patient has acutely developed nausea and vomiting,  She was unable to take anything down,  she has tried Compazine and antacids did not help.  At this time, patient was admitted for sepsis secondary to pancreatic infection versus left lower lobe pneumonia. She is also found to have gastric outlet obstruction, general surgery, gastroenterology, oncology, palliative care consulted.  She underwent EGD with stent, nausea has improved.  Now she has recurrence of nausea and abdominal pain.  Dr. Burr Medico had long discussion with the patient, and underwent G- J-tube placement for venting of the stomach and post duodenal stent feeding by IR on 01/14/22.  Patient has been initiated on tube feedings at this time. Patient has been considered for home  hospice at this time with tube feedings.  Assessment and Plan: * Gastric outlet obstruction- (present on admission) Secondary to metastatic cancer.  Status post gastric jejunostomy tube for duodenal stent feeding.  Patient has been initiated on tube feeding.  On full liquids as well.  Has been tolerating well but has some nausea today. Recommended decreasing PO intake and relying more on tube feedings via J-tube.  Cancer related pain Palliative care on board.  On fentanyl patch and Dilaudid through the tube.  Patient will be on hospice care on discharge.  General weakness Debility and deconditioning due to advanced cancer.  Continue supportive care.  Patient will be on hospice at home  Goals of care, counseling/discussion Decision made to discharge home with hospice.  Pancreatic cancer metastasized to liver [facility])- (present on admission) Patient with metastatic pancreatic cancer followed by Dr. Burr Medico as outpatient for palliative chemotherapy.  Presenting with gastric outlet obstruction secondary to pancreatic head lesion.  Was on NG tube during hospitalization for decompression.  Patient does have poor prognosis and poor tolerance to chemotherapy and is not a candidate for further cytotoxic treatment. Due to patient wishes for tube feeding, underwent gastrojejunostomy tube placement by IR on 01/24/2022.  Tube feedings have been initiated.  Plan for discharge home with hospice.  Paroxysmal atrial fibrillation ([place])- (present on admission) Heart rate is controlled.  On Eliquis for anticoagulation. Continue Eliquis.  Type 2 diabetes mellitus with diabetic polyneuropathy, with long-term current use of insulin [facility]) Patient is on Synjardy as an outpatient which was held while inpatient. Continue Synjardy on discharge. Start Tyler Aas 10  units daily.  Hypomagnesemia-resolved as of 01/22/2022 Repleted. Resolved.  Hypophosphatemia-resolved as of 01/22/2022 Replenished. Resolved.  Sepsis (HCC)-resolved  as of 01/22/2022, (present on admission) Thought to be secondary to pancreatic infection versus left lower lobe pneumonia.  Has completed cefepime and Flagyl for 5 days. Procalcitonin 0.67>0.26.  Blood cultures no growth. Sepsis physiology has resolved.   AKI (acute kidney injury) (HCC)-resolved as of 01/22/2022, (present on admission) Resolved.  Thought to be secondary to volume depletion.  CT scan of the abdomen without any hydronephrosis.            Consultants: Palliative care Procedures performed: Upper Endoscopy (2/2), Placement of percutaneous GJ tube for venting of stomach and post duodenal stent feeding (2/8)  Disposition: Hospice care Diet recommendation:  Discharge Diet Orders (From admission, onward)     Start     Ordered   01/19/22 0000  Diet full liquid        01/19/22 1236           Full liquid diet  DISCHARGE MEDICATION: Allergies as of 01/26/2022       Reactions   Penicillins Shortness Of Breath, Itching, Swelling   Has patient had a PCN reaction causing immediate rash, facial/tongue/throat swelling, SOB or lightheadedness with hypotension: Yes Has patient had a PCN reaction causing severe rash involving mucus membranes or skin necrosis:No Has patient had a PCN reaction that required hospitalization: No Has patient had a PCN reaction occurring within the last 10 years: no If all of the above answers are "NO", then may proceed with Cephalosporin use. *Per patient, tolerated Keflex in the past          Medication List     STOP taking these medications    doxycycline 100 MG tablet Commonly known as: ADOXA   Dulcolax 5 MG EC tablet Generic drug: bisacodyl Replaced by: bisacodyl 10 MG suppository   gabapentin 300 MG capsule Commonly known as: NEURONTIN   lidocaine-prilocaine cream Commonly known as: EMLA   loratadine 10 MG tablet Commonly known as: CLARITIN   LORazepam 0.5 MG tablet Commonly known as: ATIVAN   omeprazole 20 MG  capsule Commonly known as: PRILOSEC   Oxycodone HCl 10 MG Tabs   pravastatin 40 MG tablet Commonly known as: PRAVACHOL       TAKE these medications    acetaminophen 500 MG tablet Commonly known as: TYLENOL Take 1,000 mg by mouth every 6 (six) hours as needed for moderate pain.   amLODipine 10 MG tablet Commonly known as: NORVASC Take 1 tablet (10 mg total) by mouth at bedtime.   apixaban 5 MG Tabs tablet Commonly known as: Eliquis Take 1 tablet (5 mg total) by mouth 2 (two) times daily.   BIOFREEZE ROLL-ON EX Apply 1 application topically as needed (pain).   bisacodyl 10 MG suppository Commonly known as: DULCOLAX Place 1 suppository (10 mg total) rectally daily as needed for moderate constipation. Replaces: Dulcolax 5 MG EC tablet   feeding supplement (KATE FARMS STANDARD 1.4) Liqd liquid Place 1,560 mLs into feeding tube continuous.   fentaNYL 75 MCG/HR Commonly known as: DURAGESIC Place 2 patches onto the skin every 3 (three) days.   free water Soln Place 100 mLs into feeding tube every 3 (three) hours.   HYDROmorphone 2 MG tablet Commonly known as: DILAUDID Place 3 tablets (6 mg total) into feeding tube every 3 (three) hours as needed for severe pain or moderate pain.   insulin degludec 100 UNIT/ML FlexTouch Pen Commonly known as: TRESIBA  Inject 10 Units into the skin daily.   Insulin Pen Needle 31G X 5 MM Misc BD Pen Needles- brand specific Inject insulin via insulin pen 1 x daily   magic mouthwash w/lidocaine Soln Take 5 mLs by mouth 4 (four) times daily as needed for mouth pain.   mirtazapine 15 MG tablet Commonly known as: Remeron Take 1 tablet (15 mg total) by mouth at bedtime.   multivitamin capsule Take 1 capsule by mouth daily.   ondansetron 8 MG tablet Commonly known as: ZOFRAN TAKE 1 TABLET(8 MG) BY MOUTH TWICE DAILY AS NEEDED FOR NAUSEA OR VOMITING   pantoprazole 40 MG tablet Commonly known as: Protonix Take 1 tablet (40 mg total) by  mouth 2 (two) times daily before a meal.   polyethylene glycol 17 g packet Commonly known as: MIRALAX / GLYCOLAX Take 17 g by mouth daily as needed for moderate constipation.   prochlorperazine 10 MG tablet Commonly known as: COMPAZINE TAKE 1 TABLET(10 MG) BY MOUTH EVERY 6 HOURS AS NEEDED FOR NAUSEA OR VOMITING What changed: See the new instructions.   senna-docusate 8.6-50 MG tablet Commonly known as: Senokot-S Take 1 tablet by mouth 2 (two) times daily.   Synjardy XR 12.04-999 MG Tb24 Generic drug: Empagliflozin-metFORMIN HCl ER Take 1 tablet by mouth 2 (two) times daily.   trolamine salicylate 10 % cream Commonly known as: ASPERCREME Apply 1 application topically 2 (two) times daily as needed for muscle pain.       ASK your doctor about these medications    phosphorus 155-852-130 MG tablet Commonly known as: K PHOS NEUTRAL Take 1 tablet (250 mg total) by mouth daily for 5 days. Ask about: Should I take this medication?         Discharge Exam: Filed Weights   01/22/22 0500 01/23/22 0339 01/25/22 0432  Weight: (!) 161.4 kg (!) 164.9 kg (!) 161.7 kg   General exam: Appears calm and comfortable Respiratory system: Clear to auscultation. Respiratory effort normal. Cardiovascular system: S1 & S2 heard, RRR. No murmurs, rubs, gallops or clicks. Gastrointestinal system: Abdomen is nondistended, soft. Normal bowel sounds heard. Central nervous system: Alert and oriented. No focal neurological deficits. Musculoskeletal: No calf tenderness Skin: No cyanosis. No rashes Psychiatry: Judgement and insight appear normal. Mood & affect appropriate.   Condition at discharge: stable  The results of significant diagnostics from this hospitalization (including imaging, microbiology, ancillary and laboratory) are listed below for reference.   Imaging Studies: CT ABDOMEN PELVIS WO CONTRAST  Result Date: 01/07/2022 CLINICAL DATA:  Metastatic pancreatic cancer. Hyperglycemia.  Possible small bowel obstruction. EXAM: CT ABDOMEN AND PELVIS WITHOUT CONTRAST TECHNIQUE: Multidetector CT imaging of the abdomen and pelvis was performed following the standard protocol without IV contrast. RADIATION DOSE REDUCTION: This exam was performed according to the departmental dose-optimization program which includes automated exposure control, adjustment of the mA and/or kV according to patient size and/or use of iterative reconstruction technique. COMPARISON:  10/25/2021 FINDINGS: Lower chest: Ground-glass opacity seen in the posterior right lower lobe previously has become more confluent and organized in the interval with a masslike character today measuring 2.2 x 1.5 cm. There is patchy airspace disease in the lingula and left lower lobe, new in the interval. Hepatobiliary: No focal abnormality in the liver on this study without intravenous contrast. The liver shows diffusely decreased attenuation suggesting fat deposition. Increased attenuation of contents in the gallbladder lumen may be related to sludge. No intrahepatic or extrahepatic biliary dilation. Pancreas: Interval decrease in size of  the pancreatic head mass. Lesion is poorly defined on today's noncontrast study but measures approximately 4.1 x 4.0 cm compared to 6.0 x 5.3 cm previously. There is some trace gas in the region of the mass lesion today which could represent necrosis although superinfection cannot be excluded. Spleen: No splenomegaly. No focal mass lesion. Adrenals/Urinary Tract: No adrenal nodule or mass. Kidneys unremarkable. No evidence for hydroureter. Bladder is decompressed. Stomach/Bowel: Stomach is markedly distended with fluid. Duodenum is decompressed and small bowel loops are diffusely decompressed throughout. The terminal ileum is normal. The appendix is normal. Colon is decompressed with mild diverticular disease on the left. Vascular/Lymphatic: There is mild atherosclerotic calcification of the abdominal aorta  without aneurysm. There is no gastrohepatic or hepatoduodenal ligament lymphadenopathy. No retroperitoneal or mesenteric lymphadenopathy. No pelvic sidewall lymphadenopathy. Reproductive: IUD is visualized in the uterus. There is no adnexal mass. Other: No intraperitoneal free fluid. Musculoskeletal: Small umbilical hernia contains only fat. No worrisome lytic or sclerotic osseous abnormality. IMPRESSION: 1. Interval decrease in size of the pancreatic head mass, poorly demonstrated on today's noncontrast CT. Trace gas in the region of the mass lesion today could represent necrosis although superinfection cannot be excluded. 2. Marked distention of the stomach with fluid. Duodenum is decompressed and small bowel loops are diffusely decompressed throughout. Imaging features are compatible with gastric outlet obstruction, likely secondary to involvement by the pancreatic head lesion. 3. Interval development of patchy airspace disease in the lingula and left lower lobe. Imaging features are compatible with infection/inflammation. 4. Ground-glass opacity in the posterior right lower lobe has become more confluent and organized in the interval with a masslike character today measuring 2.2 x 1.5 cm. While likely infectious/inflammatory, close follow-up recommended. 5. Hepatic steatosis. 6. Small umbilical hernia contains only fat. 7. Aortic Atherosclerosis (ICD10-I70.0). Electronically Signed   By: Misty Stanley M.D.   On: 01/07/2022 06:55   DG Abd 1 View  Result Date: 01/12/2022 CLINICAL DATA:  Abdominal distention EXAM: ABDOMEN - 1 VIEW COMPARISON:  CT done on 01/07/2022 FINDINGS: Bowel gas pattern is nonspecific. Small amount of stool is seen in colon. Liver appears to be enlarged in size. No abnormal calcifications are seen. IUD is seen in pelvis. There is stent in the course of first and second portions of duodenum. Pacemaker leads are seen in the heart. IMPRESSION: Nonspecific bowel gas pattern. Stent is seen in  the region of duodenum. Electronically Signed   By: Elmer Picker M.D.   On: 01/12/2022 14:50   CT HEAD WO CONTRAST (5MM)  Result Date: 01/01/2022 CLINICAL DATA:  Nonspecific dizziness with slight headache. On Eliquis. EXAM: CT HEAD WITHOUT CONTRAST TECHNIQUE: Contiguous axial images were obtained from the base of the skull through the vertex without intravenous contrast. RADIATION DOSE REDUCTION: This exam was performed according to the departmental dose-optimization program which includes automated exposure control, adjustment of the mA and/or kV according to patient size and/or use of iterative reconstruction technique. COMPARISON:  Sixteen days ago FINDINGS: Brain: No evidence of acute infarction, hemorrhage, hydrocephalus, extra-axial collection or mass lesion/mass effect. Dilated perivascular space below the left putamen. Notable cerebellar volume loss for age. Vascular: No hyperdense vessel or unexpected calcification. Skull: Normal. Negative for fracture or focal lesion. Sinuses/Orbits: No acute finding. IMPRESSION: No acute or interval finding. Electronically Signed   By: Jorje Guild M.D.   On: 01/01/2022 06:11   IR GASTROSTOMY TUBE MOD SED  Result Date: 01/14/2022 INDICATION: 61 year old female with pancreatic adenocarcinoma and malignant gastric outlet obstruction. She  recently underwent duodenal stent placement. She presents for attempted percutaneous gastrostomy tube placement with J arm extension. EXAM: PERC PLACEMENT GASTROSTOMY MEDICATIONS: None ANESTHESIA/SEDATION: Versed 2 mg IV; Fentanyl 100 mcg IV Moderate Sedation Time:  45 minutes The patient's vital signs and level of consciousness were continuously monitored during the procedure by the interventional radiology nurse under my direct supervision. CONTRAST:  31mL OMNIPAQUE IOHEXOL 300 MG/ML SOLN - administered into the gastric lumen. FLUOROSCOPY TIME:  417 mGy air kerma COMPLICATIONS: None immediate. PROCEDURE: Informed written  consent was obtained from the patient after a thorough discussion of the procedural risks, benefits and alternatives. All questions were addressed. Maximal Sterile Barrier Technique was utilized including caps, mask, sterile gowns, sterile gloves, sterile drape, hand hygiene and skin antiseptic. A timeout was performed prior to the initiation of the procedure. A 5 French catheter was advanced through the mouth, down the esophagus and into the stomach. The stomach was insufflated with air. Under real-time fluoroscopic guidance, 2 T tacks were placed in the region of the junction of the gastric body and gastric antrum. Then, the anterior gastric wall was punctured with an 18 gauge single wall needle. Contrast was injected through the needle confirming presence of the needle tip within the gastric lumen. An Amplatz wire was then advanced into the stomach. The needle was removed. A 10 x 80 mm [employer] balloon was then advanced coaxially through a 68 [place] Entuit balloon retention gastrostomy tube. The balloon was advanced and positioned across the anterior abdominal wall tract into the stomach. The balloon was then inflated. As the balloon was deflated, the balloon and G-tube were advanced as a unit into the gastric lumen. The retention balloon was filled with 9 mL dilute contrast and pulled snug against the anterior abdominal wall. The Amplatz wire was removed. An angled catheter was advanced coaxially through the newly placed gastrostomy tube and was successfully advanced through the duodenal stent and into the proximal jejunum using a combination of wires. Ultimately, a superstiff Amplatz wire was advanced into the proximal jejunum. A 9 French J arm extension was then advanced over the wire and position with the tip in the proximal jejunum. Contrast was injected through both the jejunal and gastric lumens confirming that the tip of the J arm is indeed within the small bowel and the gastric lumen is within the  stomach. The external bumper was secured in place. Images were obtained and stored for the medical record. IMPRESSION: Successful placement of a 20 French balloon retention gastrostomy tube with a 9 [place] J arm extension. The tip of the J arm is within the small bowel and ready for immediate use. The G portion of the tube can be used for gastric decompression as needed. Electronically Signed   By: Jacqulynn Cadet M.D.   On: 01/14/2022 18:18   IR REMOVAL TUN ACCESS W/ PORT W/O FL MOD SED  Result Date: 01/02/2022 INDICATION: 61 year old with pancreatic cancer. Port-A-Cath was placed on 11/11/2021. Partial incision dehiscence was noted and there is concern for port infection. Plan for port removal. EXAM: REMOVAL RIGHT IJ VEIN PORT-A-CATH MEDICATIONS: Moderate sedation ANESTHESIA/SEDATION: Moderate (conscious) sedation was employed during this procedure. A total of Versed 2.0mg  and fentanyl 100 mcg was administered intravenously at the order of the provider performing the procedure. Total intra-service moderate sedation time: 23 minutes. Patient's level of consciousness and vital signs were monitored continuously by radiology nurse throughout the procedure under the supervision of the provider performing the procedure. FLUOROSCOPY TIME:  None COMPLICATIONS:  None immediate. PROCEDURE: Informed written consent was obtained from the patient after a thorough discussion of the procedural risks, benefits and alternatives. All questions were addressed. Maximal Sterile Barrier Technique was utilized including caps, mask, sterile gowns, sterile gloves, sterile drape, hand hygiene and skin antiseptic. A timeout was performed prior to the initiation of the procedure. The right chest was prepped and draped in a sterile fashion. Lidocaine with epinephrine was utilized for local anesthesia. An incision was made over the port incision. Utilizing blunt dissection, the port catheter and reservoir were removed from the underlying  subcutaneous tissue in their entirety. The pocket was irrigated with a copious amount of sterile normal saline. Pocket was closed using 3 interrupted 2-0 Ethilon sutures. Dressing was placed over the incision. FINDINGS: The port incision was almost completely closed. There was small amount of dry blood or scab formation along the medial aspect of the incision. No significant erythema around the port. Incision was made over the old incision. No evidence for purulent drainage. Small amount of bloody fluid was removed from the pocket. Inspection of the port pocket demonstrated normal healthy tissue. Pocket was rigorously irrigated with saline. Incision was closed using interrupted sutures. IMPRESSION: Successful right IJ vein Port-A-Cath explant. Patient has interrupted sutures that will be removed in approximately 2 weeks. Electronically Signed   By: Markus Daft M.D.   On: 01/02/2022 14:19   DG Chest Port 1 View  Result Date: 01/07/2022 CLINICAL DATA:  Possible sepsis. EXAM: PORTABLE CHEST 1 VIEW COMPARISON:  01/01/2022 FINDINGS: 0605 hours low lung volumes. The cardio pericardial silhouette is enlarged. There is pulmonary vascular congestion without overt pulmonary edema. No evidence for pleural effusion. Prominent gastric bubble noted under the left hemidiaphragm. Left permanent pacemaker noted. Telemetry leads overlie the chest. IMPRESSION: Low volume chest x-ray with pulmonary vascular congestion. Electronically Signed   By: Misty Stanley M.D.   On: 01/07/2022 06:33   DG Chest Portable 1 View  Result Date: 01/01/2022 CLINICAL DATA:  61 year old female with history of dizziness. EXAM: PORTABLE CHEST 1 VIEW COMPARISON:  Chest x-ray 06/15/2021. FINDINGS: Lung volumes are low. No consolidative airspace disease. No pleural effusions. No pneumothorax. No pulmonary nodule or mass noted. Pulmonary vasculature and the cardiomediastinal silhouette are within normal limits. Left-sided pacemaker device in place with  lead tips projecting over the expected location of the right atrium and right ventricle. Right internal jugular single-lumen power porta cath with tip terminating in the mid superior vena cava. IMPRESSION: 1. Low lung volumes without radiographic evidence of acute cardiopulmonary disease. Electronically Signed   By: Vinnie Langton M.D.   On: 01/01/2022 05:39   DG C-Arm 1-60 Min  Result Date: 01/08/2022 CLINICAL DATA:  History of metastatic pancreatic cancer now with duodenal obstruction. EXAM: ERCP TECHNIQUE: Multiple spot images obtained with the fluoroscopic device and submitted for interpretation post-procedure. FLUOROSCOPY TIME: FLUOROSCOPY TIME 1 minute, 7 seconds (29.3 mGy) COMPARISON:  CT abdomen and pelvis-01/07/2022 FINDINGS: Four spot intraoperative fluoroscopic images of the right upper abdominal quadrant during ERCP are provided for review. Initial image demonstrates an ERCP probe overlying the right upper abdominal quadrant. Subsequent images demonstrate placement of a enteric stent which appears to traverse the expected location of the gastric antrum, duodenal bulb and proximal aspect of the descending duodenum. IMPRESSION: Post gastric outlet and proximal duodenal enteric stent placement. These images were submitted for radiologic interpretation only. Please see the procedural report for the amount of contrast and the fluoroscopy time utilized. Electronically Signed   By:  Sandi Mariscal M.D.   On: 01/08/2022 14:05   CUP PACEART REMOTE DEVICE CHECK  Result Date: 01/09/2022 Scheduled remote reviewed. Normal device function.  7 NSVT, 5 fast AV.  All EGM's show regular 1:1, rates 154-182, longest duration 54min 50sec Overall controlled ventricular rates Decrease in activity noted Next remote 91 days. [place]   Microbiology: Results for orders placed or performed during the hospital encounter of 01/07/22  Blood Culture (routine x 2)     Status: None   Collection Time: 01/07/22  5:59 AM   Specimen:  Site Not Specified; Blood  Result Value Ref Range Status   Specimen Description   Final    SITE NOT SPECIFIED Performed at [facility] [address] [place], [place] [address]    Special Requests   Final    BOTTLES DRAWN AEROBIC AND ANAEROBIC Blood Culture adequate volume Performed at [facility] [address], [place], [place] [address]    Culture   Final    NO GROWTH 5 DAYS Performed at [facility], [place] [address] [place], [place] [address]    Report Status 01/12/2022 FINAL  Final  Urine Culture     Status: None   Collection Time: 01/07/22  5:59 AM   Specimen: In/Out Cath Urine  Result Value Ref Range Status   Specimen Description   Final    IN/OUT CATH URINE Performed at [place] [address], [place], [place] [address]    Special Requests   Final    NONE Performed at [facility], [place] [address], [place], [place] [address]    Culture   Final    NO GROWTH Performed at [facility], [place] [address] [place], [place] [address]    Report Status 01/09/2022 FINAL  Final  Resp Panel by RT-PCR (Flu A&B, Covid) Nasopharyngeal Swab     Status: None   Collection Time: 01/07/22  8:38 AM   Specimen: Nasopharyngeal Swab; Nasopharyngeal(NP) swabs in vial transport medium  Result Value Ref Range Status   SARS Coronavirus 2 by RT PCR NEGATIVE NEGATIVE Final    Comment: (NOTE) SARS-CoV-2 target nucleic acids are NOT DETECTED.  The SARS-CoV-2 RNA is generally detectable in upper respiratory specimens during the acute phase of infection. The lowest concentration of SARS-CoV-2 viral copies this assay can detect is 138 copies/mL. A negative result does not preclude SARS-Cov-2 infection and should not be used as the sole basis for treatment or other patient management decisions. A negative result may occur with  improper specimen collection/handling, submission of specimen other than  nasopharyngeal swab, presence of viral mutation(s) within the areas targeted by this assay, and inadequate number of viral copies(<138 copies/mL). A negative result must be combined with clinical observations, patient history, and epidemiological information. The expected result is Negative.  Fact Sheet for Patients:  EntrepreneurPulse.com.au  Fact Sheet for Healthcare Providers:  IncredibleEmployment.be  This test is no t yet approved or cleared by the [place] FDA and  has been authorized for detection and/or diagnosis of SARS-CoV-2 by FDA under an Emergency Use Authorization (EUA). This EUA will remain  in effect (meaning this test can be used) for the duration of the COVID-19 declaration under Section 564(b)(1) of the Act, 21 U.S.C.section 360bbb-3(b)(1), unless the authorization is terminated  or revoked sooner.       Influenza A by PCR NEGATIVE NEGATIVE Final   Influenza B by PCR NEGATIVE NEGATIVE Final  Comment: (NOTE) The Xpert Xpress SARS-CoV-2/FLU/RSV plus assay is intended as an aid in the diagnosis of influenza from Nasopharyngeal swab specimens and should not be used as a sole basis for treatment. Nasal washings and aspirates are unacceptable for Xpert Xpress SARS-CoV-2/FLU/RSV testing.  Fact Sheet for Patients: EntrepreneurPulse.com.au  Fact Sheet for Healthcare Providers: IncredibleEmployment.be  This test is not yet approved or cleared by the [place] FDA and has been authorized for detection and/or diagnosis of SARS-CoV-2 by FDA under an Emergency Use Authorization (EUA). This EUA will remain in effect (meaning this test can be used) for the duration of the COVID-19 declaration under Section 564(b)(1) of the Act, 21 U.S.C. section 360bbb-3(b)(1), unless the authorization is terminated or revoked.  Performed at [facility] [place] [address], [place], [place] [address]   Blood Culture (routine x 2)     Status: None   Collection Time: 01/07/22  5:17 PM   Specimen: BLOOD  Result Value Ref Range Status   Specimen Description   Final    BLOOD RIGHT ANTECUBITAL Performed at [place] [address] [place], [place] [address]    Special Requests   Final    BOTTLES DRAWN AEROBIC ONLY Blood Culture results may not be optimal due to an inadequate volume of blood received in culture bottles Performed at [place] [address], [place], [place] [address]    Culture   Final    NO GROWTH 5 DAYS Performed at [facility], [place] [address], [place], [place] [address]    Report Status 01/12/2022 FINAL  Final    Labs: CBC: Recent Labs  Lab 01/20/22 0459 01/21/22 0508 01/22/22 0433 01/23/22 0445 01/24/22 1237  WBC 12.5* 12.3* 11.7* 10.9* 13.5*  HGB 8.3* 8.0* 8.0* 7.4* 8.3*  HCT 26.2* 25.6* 25.7* 23.7* 26.4*  MCV 95.3 95.2 94.8 94.0 95.0  PLT 400 343 298 260 229   Basic Metabolic Panel: Recent Labs  Lab 01/19/22 1730 01/20/22 0459  MG 1.6* 1.9  PHOS 1.5* 2.5   Liver Function Tests: No results for input(s): AST, ALT, ALKPHOS, BILITOT, PROT, ALBUMIN in the last 168 hours. CBG: Recent Labs  Lab 01/25/22 1636 01/25/22 2005 01/26/22 0017 01/26/22 0443 01/26/22 0759  GLUCAP 140* 167* 180* 161* 139*    Discharge time spent: 35 minutes.  Signed: Cordelia Poche, MD [facility] 01/26/2022

## 2022-01-26 NOTE — TOC Transition Note (Signed)
Transition of Care [facility]) - CM/SW Discharge Note   Patient Details  Name: Leslie Mullen MRN: [id number] Date of Birth: 1961-10-31  Transition [facility]) CM/SW Contact:  Dessa Phi, RN Phone Number: 01/26/2022, 9:51 AM   Clinical Narrative: spoke to dtr [name]-confirmed agree to d/c today;awaiting delivery of hoyer lift;& now w/c(between 2p-6p)[facility] ordered by [facility]-rep Cheri aware-[name] agree to PTAR scheduled for 7p pick u-dtr will be in the home for TF teaching. [facility] home visit to be scheduled once patient in the home this evening. PTAR forms @ nsg station.MD notified of d/c summary with meds listed, & TF flushes & formula rate. No further CM needs.    Final next level of care: Home w Hospice Care Barriers to Discharge: No Barriers Identified   Patient Goals and CMS Choice Patient states their goals for this hospitalization and ongoing recovery are:: home w/hospice CMS Medicare.gov Compare Post Acute Care list provided to:: Patient Represenative (must comment) Choice offered to / list presented to : Patient, Adult Children  Discharge Placement                Patient to be transferred to facility by: [facility] Name of family member notified: [facility] Patient and family notified of of transfer: 01/26/22  Discharge Plan and Services     Post Acute Care Choice: NA                      [place] Agency: [place] [facility] Interventions     Readmission Risk Interventions Readmission Risk Prevention Plan 01/08/2022  Transportation Screening Complete  Medication Review Press photographer) Complete  PCP or Specialist appointment within 3-5 days of discharge Complete  HRI or [place] Complete  SW Recovery Care/Counseling Consult Complete  [place] Not Applicable  Some recent data might be hidden

## 2022-01-27 ENCOUNTER — Encounter (HOSPITAL_COMMUNITY): Payer: Self-pay | Admitting: *Deleted

## 2022-01-27 NOTE — Progress Notes (Signed)
Seraphim Huezo's daughter [name] [place] was contacted by telephone to verify that hospice care was in place and see if there was anything she needed that we could facilitate for them.  States that hospice in in home and they have what they need at this time.   Ms. Doug Sou questions were addressed to their satisfaction upon completion of this post discharge follow-up call for outpatient oncology.

## 2022-01-29 ENCOUNTER — Telehealth: Payer: Self-pay | Admitting: Student

## 2022-01-29 NOTE — Telephone Encounter (Signed)
Patient with venting g-j tube placed 01/14/22 by Dr. Laurence Ferrari. Two t-tacks left in place. The patient's hospice nurse Catalina Lunger called [facility] IR today stating one of the t-tacks came undone and the other t-tack site looks irritated. Verbal order given to Catalina Lunger to remove remaining t-tack. I offered to make an appointment to have the patient come to [facility] for evaluation/t-tack removal but the patient would need to come by ambulance and the hospice team felt comfortable removing the other t-tack. Catalina Lunger has the number to the [facility] APP office to call if there are any future questions/concerns.  Soyla Dryer, [place] [phone] 01/29/2022, 4:24 PM

## 2022-02-02 ENCOUNTER — Other Ambulatory Visit (HOSPITAL_COMMUNITY): Payer: Self-pay | Admitting: Physician Assistant

## 2022-02-03 ENCOUNTER — Other Ambulatory Visit (HOSPITAL_COMMUNITY): Payer: Self-pay

## 2022-02-03 ENCOUNTER — Encounter: Payer: Self-pay | Admitting: Hematology

## 2022-02-03 MED ORDER — FENTANYL 100 MCG/HR TD PT72
MEDICATED_PATCH | TRANSDERMAL | 0 refills | Status: AC
  Filled 2022-02-03: qty 5, 3d supply, fill #0

## 2022-02-03 MED ORDER — PROCHLORPERAZINE MALEATE 10 MG PO TABS
ORAL_TABLET | ORAL | 2 refills | Status: AC
  Filled 2022-02-03: qty 45, 11d supply, fill #0

## 2022-02-03 MED ORDER — HYDROMORPHONE HCL 4 MG PO TABS
ORAL_TABLET | ORAL | 0 refills | Status: AC
  Filled 2022-02-03: qty 50, 10d supply, fill #0

## 2022-02-06 ENCOUNTER — Other Ambulatory Visit (HOSPITAL_COMMUNITY): Payer: Self-pay

## 2022-02-11 ENCOUNTER — Other Ambulatory Visit (HOSPITAL_COMMUNITY): Payer: Self-pay

## 2022-02-12 ENCOUNTER — Other Ambulatory Visit (HOSPITAL_COMMUNITY): Payer: Self-pay

## 2022-02-27 ENCOUNTER — Other Ambulatory Visit (HOSPITAL_COMMUNITY): Payer: Self-pay

## 2022-03-07 DEATH — deceased

## 2022-04-13 NOTE — Telephone Encounter (Signed)
NA

## 2022-07-07 IMAGING — CT CT HEAD W/O CM
3 series · 16 of 47 positions shown, 19 images · non-contrast
Comparison: None.

CLINICAL DATA: Head trauma, moderate to severe. Fall with head
injury on anticoagulation.

EXAM:
CT HEAD WITHOUT CONTRAST
TECHNIQUE: Contiguous axial images were obtained from the base of the skull
through the vertex without intravenous contrast.

[Series 2: head wo · axial · 0.40mm/px · z∈[-400,-275]mm · 10 of 31 slices shown, 13 images]
[im 3/31  brain]
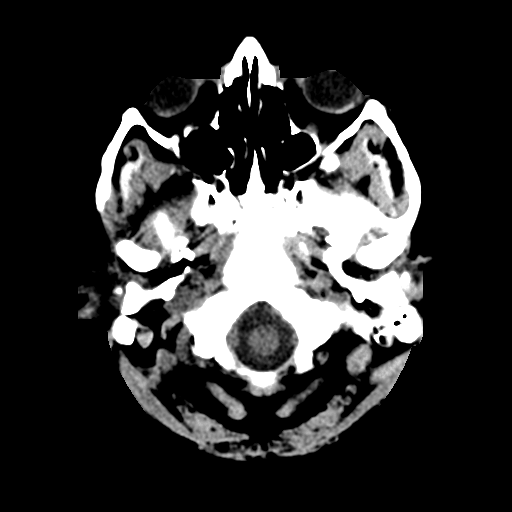
[im 3/31  bone]
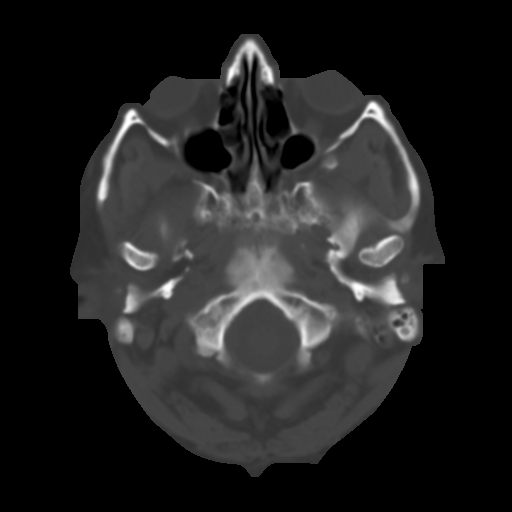
[im 6/31  brain]
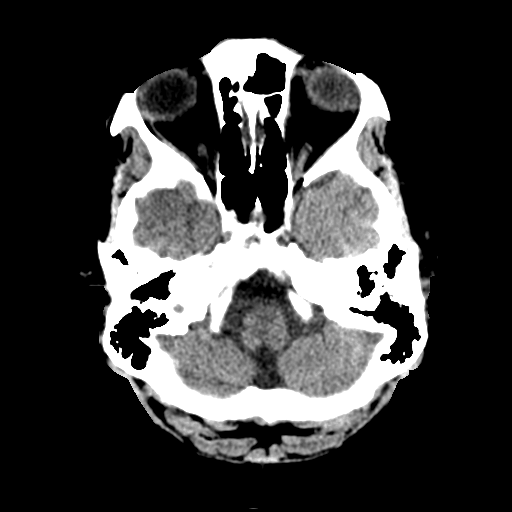
[im 9/31  brain]
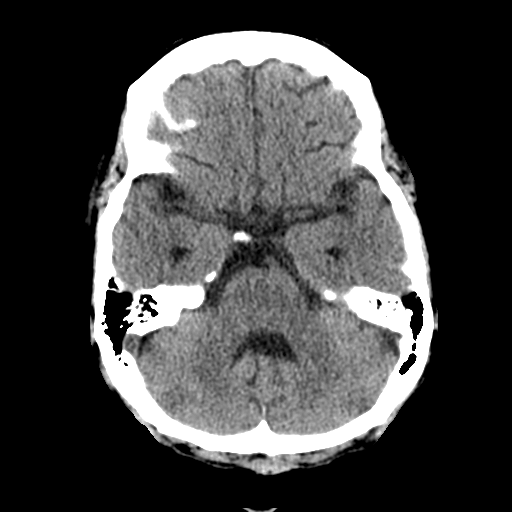
[im 11/31  brain]
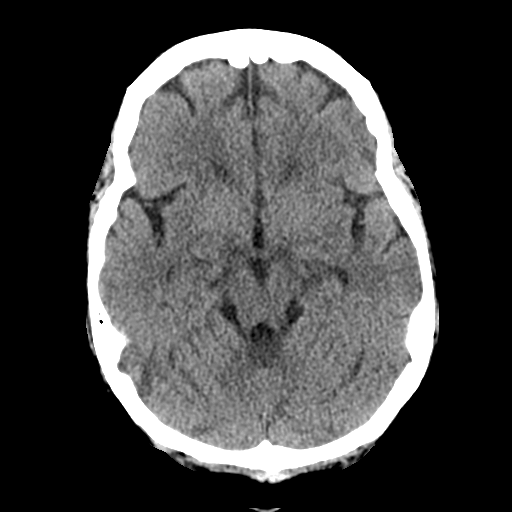
[im 14/31  brain]
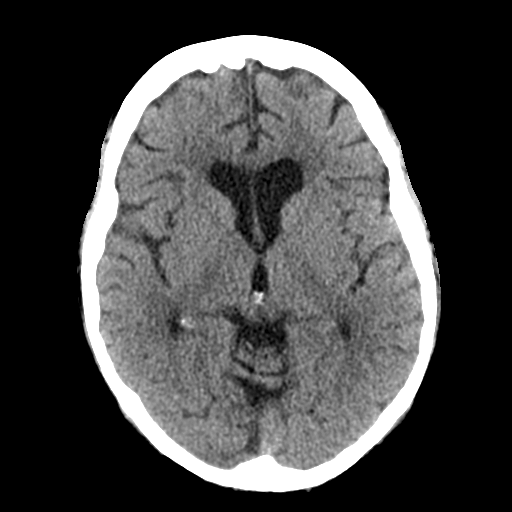
[im 14/31  bone]
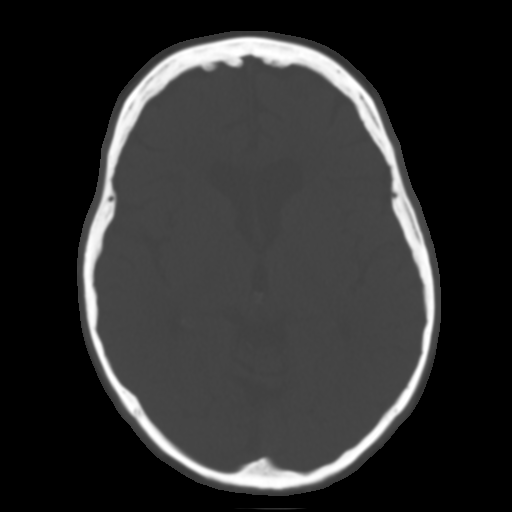
[im 17/31  brain]
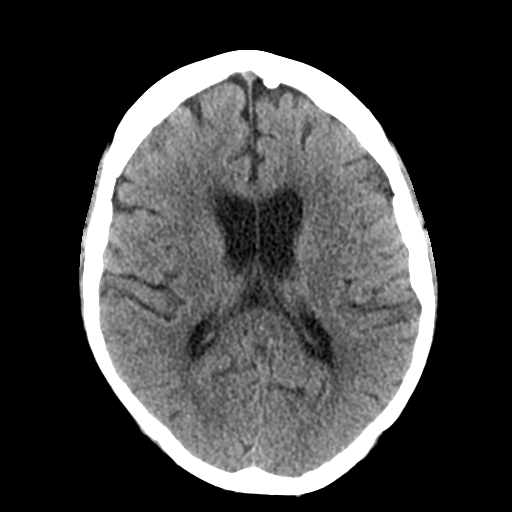
[im 20/31  brain]
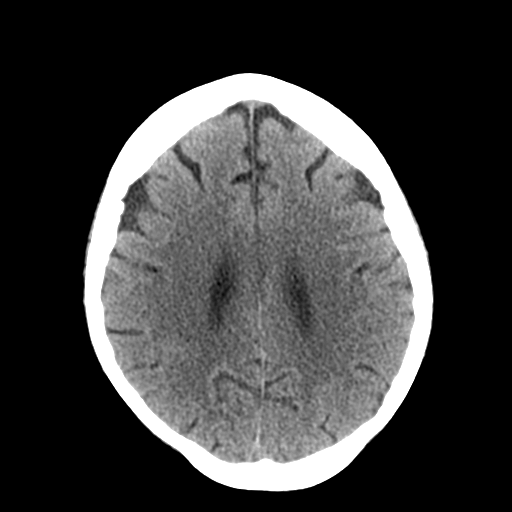
[im 23/31  brain]
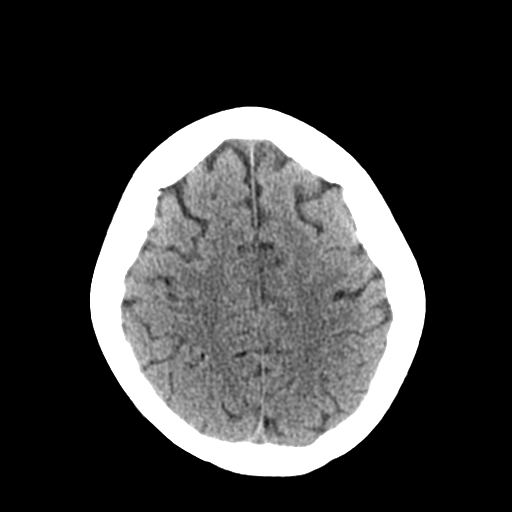
[im 25/31  brain]
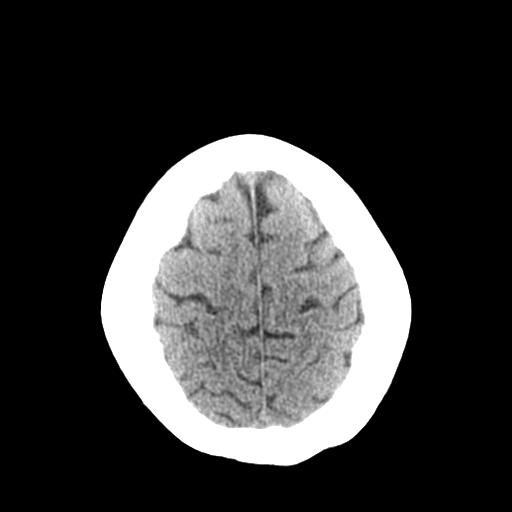
[im 25/31  bone]
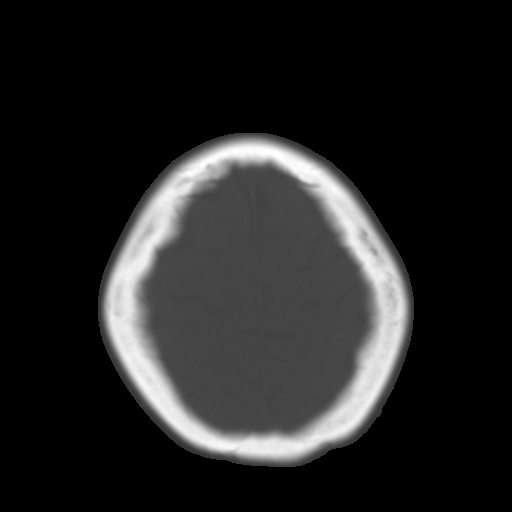
[im 28/31  brain]
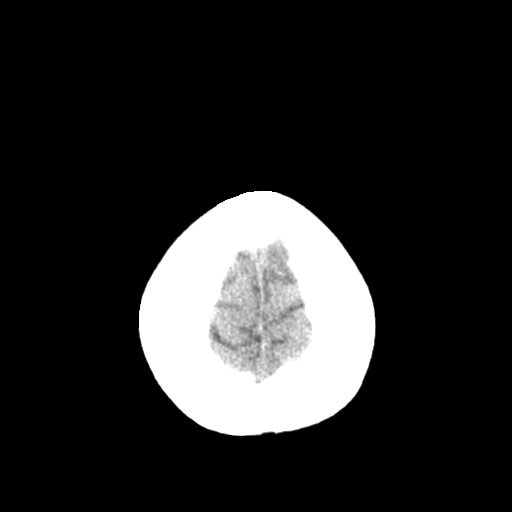

[Series 5: coronal soft tissue · coronal · 0.30mm/px · 3 of 64 slices shown]
[im 22/64  brain]
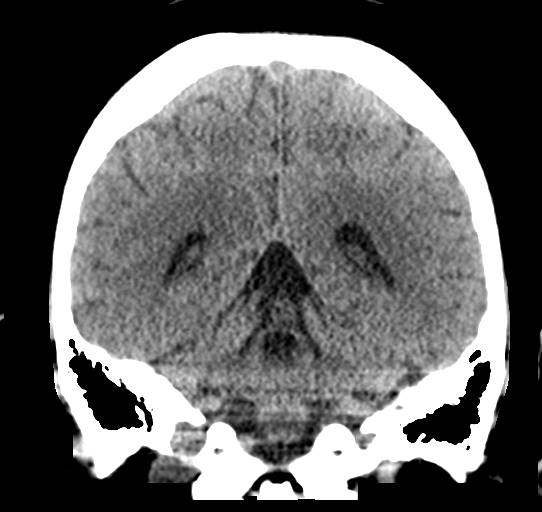
[im 29/64  brain]
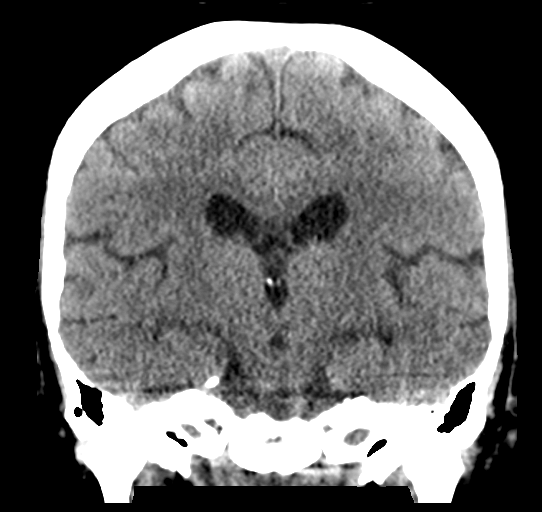
[im 36/64  brain]
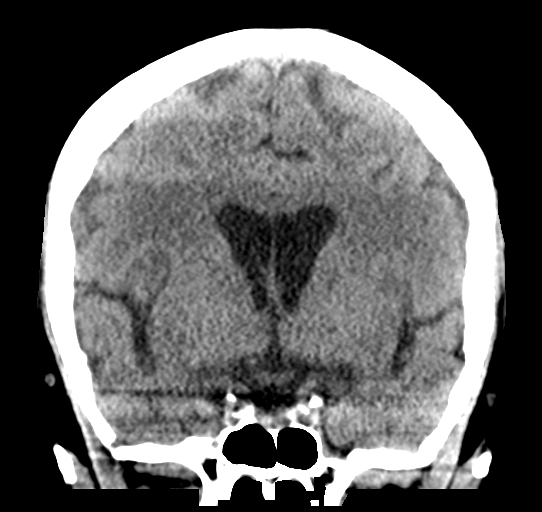

[Series 6: sagittal soft tissue · sagittal · 0.30mm/px · 3 of 55 slices shown]
[im 19/55  brain]
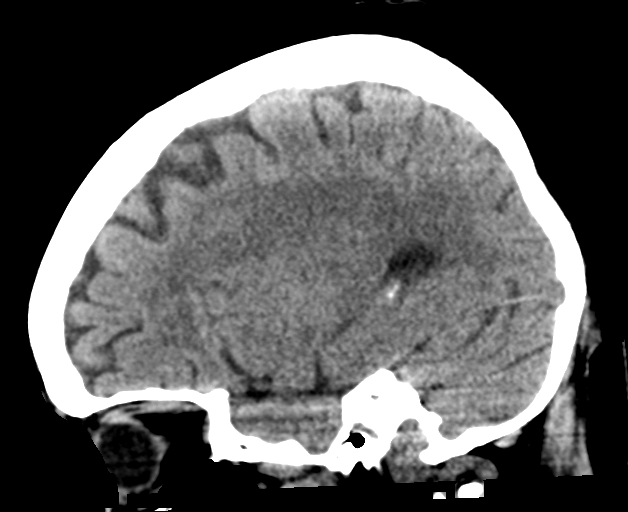
[im 28/55  brain]
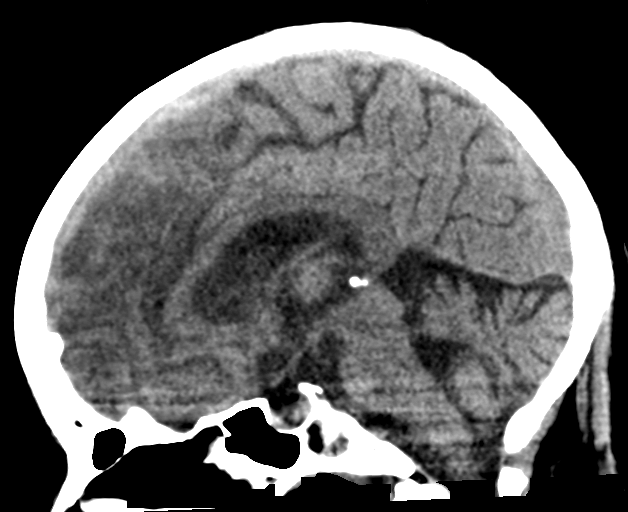
[im 37/55  brain]
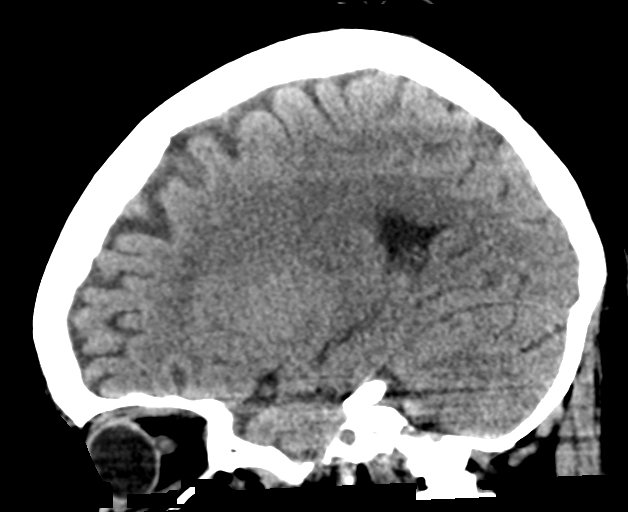

[16 of 47 positions shown; findings below may reference images not displayed]

FINDINGS: Brain: The brain shows a normal appearance without evidence of
malformation, atrophy, old or acute small or large vessel
infarction, mass lesion, hemorrhage, hydrocephalus or extra-axial
collection.

Vascular: There is atherosclerotic calcification of the major
vessels at the base of the brain.

Skull: Normal.  No traumatic finding.  No focal bone lesion.

Sinuses/Orbits: Sinuses are clear. Orbits appear normal. Mastoids
are clear.

Other: None significant
IMPRESSION: No acute or traumatic finding. Normal study with exception of
atherosclerotic calcification of the major vessels at the base of
brain.

## 2023-11-25 LAB — MOLECULAR PATHOLOGY
